# Patient Record
Sex: Female | Born: 1975 | Race: White | Hispanic: No | Smoking: Current every day smoker
Health system: Southern US, Community
[De-identification: ages and names within clinical notes are randomized; demographics above are authoritative.]

## PROBLEM LIST (undated history)

## (undated) DIAGNOSIS — G8929 Other chronic pain: Secondary | ICD-10-CM

## (undated) DIAGNOSIS — E079 Disorder of thyroid, unspecified: Secondary | ICD-10-CM

## (undated) DIAGNOSIS — I10 Essential (primary) hypertension: Secondary | ICD-10-CM

## (undated) DIAGNOSIS — N3090 Cystitis, unspecified without hematuria: Secondary | ICD-10-CM

## (undated) DIAGNOSIS — D649 Anemia, unspecified: Secondary | ICD-10-CM

## (undated) DIAGNOSIS — D689 Coagulation defect, unspecified: Secondary | ICD-10-CM

## (undated) DIAGNOSIS — R1084 Generalized abdominal pain: Secondary | ICD-10-CM

## (undated) DIAGNOSIS — L409 Psoriasis, unspecified: Secondary | ICD-10-CM

## (undated) DIAGNOSIS — M87059 Idiopathic aseptic necrosis of unspecified femur: Secondary | ICD-10-CM

## (undated) DIAGNOSIS — M810 Age-related osteoporosis without current pathological fracture: Secondary | ICD-10-CM

## (undated) DIAGNOSIS — F419 Anxiety disorder, unspecified: Secondary | ICD-10-CM

## (undated) DIAGNOSIS — B977 Papillomavirus as the cause of diseases classified elsewhere: Secondary | ICD-10-CM

## (undated) DIAGNOSIS — G709 Myoneural disorder, unspecified: Secondary | ICD-10-CM

## (undated) DIAGNOSIS — M1990 Unspecified osteoarthritis, unspecified site: Secondary | ICD-10-CM

## (undated) DIAGNOSIS — K219 Gastro-esophageal reflux disease without esophagitis: Secondary | ICD-10-CM

## (undated) HISTORY — DX: Generalized abdominal pain: R10.84

## (undated) HISTORY — PX: CHOLECYSTECTOMY: SHX55

## (undated) HISTORY — DX: Other chronic pain: G89.29

## (undated) HISTORY — DX: Idiopathic aseptic necrosis of unspecified femur: M87.059

## (undated) HISTORY — DX: Psoriasis, unspecified: L40.9

## (undated) HISTORY — DX: Coagulation defect, unspecified: D68.9

## (undated) HISTORY — DX: Papillomavirus as the cause of diseases classified elsewhere: B97.7

## (undated) HISTORY — DX: Anxiety disorder, unspecified: F41.9

## (undated) HISTORY — DX: Anemia, unspecified: D64.9

## (undated) HISTORY — DX: Age-related osteoporosis without current pathological fracture: M81.0

## (undated) HISTORY — DX: Myoneural disorder, unspecified: G70.9

---

## 2001-05-11 HISTORY — PX: GASTRIC BYPASS: SHX52

## 2002-03-13 HISTORY — PX: SMALL INTESTINE SURGERY: SHX150

## 2012-08-11 DIAGNOSIS — B977 Papillomavirus as the cause of diseases classified elsewhere: Secondary | ICD-10-CM

## 2012-08-11 HISTORY — DX: Papillomavirus as the cause of diseases classified elsewhere: B97.7

## 2013-11-14 ENCOUNTER — Encounter (HOSPITAL_COMMUNITY): Payer: Self-pay | Admitting: Emergency Medicine

## 2013-11-14 ENCOUNTER — Emergency Department (HOSPITAL_COMMUNITY)
Admission: EM | Admit: 2013-11-14 | Discharge: 2013-11-14 | Disposition: A | Discharge status: Home / Self Care | Payer: MEDICARE | Attending: Emergency Medicine | Admitting: Emergency Medicine

## 2013-11-14 DIAGNOSIS — Z3202 Encounter for pregnancy test, result negative: Secondary | ICD-10-CM | POA: Diagnosis not present

## 2013-11-14 DIAGNOSIS — I10 Essential (primary) hypertension: Secondary | ICD-10-CM | POA: Insufficient documentation

## 2013-11-14 DIAGNOSIS — R350 Frequency of micturition: Secondary | ICD-10-CM | POA: Diagnosis not present

## 2013-11-14 DIAGNOSIS — M129 Arthropathy, unspecified: Secondary | ICD-10-CM | POA: Insufficient documentation

## 2013-11-14 DIAGNOSIS — F17200 Nicotine dependence, unspecified, uncomplicated: Secondary | ICD-10-CM | POA: Insufficient documentation

## 2013-11-14 DIAGNOSIS — R300 Dysuria: Secondary | ICD-10-CM | POA: Diagnosis present

## 2013-11-14 DIAGNOSIS — E079 Disorder of thyroid, unspecified: Secondary | ICD-10-CM | POA: Diagnosis not present

## 2013-11-14 DIAGNOSIS — Z79899 Other long term (current) drug therapy: Secondary | ICD-10-CM | POA: Insufficient documentation

## 2013-11-14 DIAGNOSIS — N3000 Acute cystitis without hematuria: Secondary | ICD-10-CM | POA: Diagnosis not present

## 2013-11-14 HISTORY — DX: Disorder of thyroid, unspecified: E07.9

## 2013-11-14 HISTORY — DX: Unspecified osteoarthritis, unspecified site: M19.90

## 2013-11-14 HISTORY — DX: Essential (primary) hypertension: I10

## 2013-11-14 LAB — CBC WITH DIFFERENTIAL/PLATELET
BASOS ABS: 0 10*3/uL (ref 0.0–0.1)
Basophils Relative: 0 % (ref 0–1)
EOS ABS: 0.1 10*3/uL (ref 0.0–0.7)
Eosinophils Relative: 2 % (ref 0–5)
HEMATOCRIT: 36.7 % (ref 36.0–46.0)
Hemoglobin: 12.7 g/dL (ref 12.0–15.0)
LYMPHS ABS: 2.3 10*3/uL (ref 0.7–4.0)
LYMPHS PCT: 32 % (ref 12–46)
MCH: 36.7 pg — AB (ref 26.0–34.0)
MCHC: 34.6 g/dL (ref 30.0–36.0)
MCV: 106.1 fL — ABNORMAL HIGH (ref 78.0–100.0)
MONO ABS: 0.6 10*3/uL (ref 0.1–1.0)
Monocytes Relative: 8 % (ref 3–12)
Neutro Abs: 4.1 10*3/uL (ref 1.7–7.7)
Neutrophils Relative %: 58 % (ref 43–77)
PLATELETS: 157 10*3/uL (ref 150–400)
RBC: 3.46 MIL/uL — ABNORMAL LOW (ref 3.87–5.11)
RDW: 15 % (ref 11.5–15.5)
WBC: 7.1 10*3/uL (ref 4.0–10.5)

## 2013-11-14 LAB — URINALYSIS, ROUTINE W REFLEX MICROSCOPIC
BILIRUBIN URINE: NEGATIVE
Glucose, UA: NEGATIVE mg/dL
Hgb urine dipstick: NEGATIVE
KETONES UR: NEGATIVE mg/dL
LEUKOCYTES UA: NEGATIVE
NITRITE: POSITIVE — AB
PROTEIN: NEGATIVE mg/dL
Specific Gravity, Urine: 1.023 (ref 1.005–1.030)
UROBILINOGEN UA: 1 mg/dL (ref 0.0–1.0)
pH: 6 (ref 5.0–8.0)

## 2013-11-14 LAB — PREGNANCY, URINE: PREG TEST UR: NEGATIVE

## 2013-11-14 LAB — LIPASE, BLOOD: LIPASE: 21 U/L (ref 11–59)

## 2013-11-14 LAB — COMPREHENSIVE METABOLIC PANEL
ALT: 8 U/L (ref 0–35)
AST: 17 U/L (ref 0–37)
Albumin: 3.4 g/dL — ABNORMAL LOW (ref 3.5–5.2)
Alkaline Phosphatase: 65 U/L (ref 39–117)
Anion gap: 13 (ref 5–15)
BUN: 16 mg/dL (ref 6–23)
CO2: 22 meq/L (ref 19–32)
CREATININE: 0.73 mg/dL (ref 0.50–1.10)
Calcium: 9.1 mg/dL (ref 8.4–10.5)
Chloride: 104 mEq/L (ref 96–112)
GLUCOSE: 93 mg/dL (ref 70–99)
Potassium: 4.6 mEq/L (ref 3.7–5.3)
SODIUM: 139 meq/L (ref 137–147)
TOTAL PROTEIN: 6.8 g/dL (ref 6.0–8.3)
Total Bilirubin: 0.3 mg/dL (ref 0.3–1.2)

## 2013-11-14 LAB — URINE MICROSCOPIC-ADD ON

## 2013-11-14 MED ORDER — CEPHALEXIN 500 MG PO CAPS: 500.0000 mg | ORAL_CAPSULE | Freq: Four times a day (QID) | ORAL | Status: DC

## 2013-11-14 MED ORDER — CEPHALEXIN 250 MG PO CAPS
500.0000 mg | ORAL_CAPSULE | Freq: Once | ORAL | Status: AC
  Administered 2013-11-14: 500 mg via ORAL
  Filled 2013-11-14: qty 2

## 2013-11-14 NOTE — ED Notes (Signed)
Poss uti for 2 weeks [place] and she has multiple problems that she has been getting tests for.  She has had some urinary incontinence.  She has some adrenal gland problems.  She has many many complaints.  lmp 2 weeks ago

## 2013-11-14 NOTE — ED Provider Notes (Signed)
CSN: [id number]     Arrival date & time 11/14/13  1748 History   First MD Initiated Contact with Patient 11/14/13 2048     Chief Complaint  Patient presents with  . multiple complaints      (Consider location/radiation/quality/duration/timing/severity/associated sxs/prior Treatment) HPI Pt presents with c/o dysuria and urinary frequency.  No fever/chills.  She denies abdominal or back pain.  No vomiting or chills.  She states symptoms have been going on for the past 2 weeks.  She states she has multiple medical problems and has been seeing a rheumatologist and has been checked for lupus, RA, adrenal problems- but nothing has been found as of yet.  She recently moved from [place] and has not been able to see a doctor in this area yet.  She denies recent treatment for UTI.  She has been eating and drinking normally.  There are no other associated systemic symptoms, there are no other alleviating or modifying factors.   Past Medical History  Diagnosis Date  . Thyroid disease   . Arthritis   . Hypertension    History reviewed. No pertinent past surgical history. No family history on file. History  Substance Use Topics  . Smoking status: Current Every Day Smoker  . Smokeless tobacco: Not on file  . Alcohol Use: Yes   OB History   Grav Para Term Preterm Abortions TAB SAB Ect Mult Living                 Review of Systems ROS reviewed and all otherwise negative except for mentioned in HPI    Allergies  Ibuprofen; Toradol; Aspirin; and Cleocin  Home Medications   Prior to Admission medications   Medication Sig Start Date End Date Taking? Authorizing Provider  aspirin-acetaminophen-caffeine (EXCEDRIN MIGRAINE) [id number] MG per tablet Take 1 tablet by mouth every 6 (six) hours as needed for headache.   Yes Historical Provider, MD  furosemide (LASIX) 40 MG tablet Take 40 mg by mouth daily as needed for fluid.   Yes Historical Provider, MD  levonorgestrel-ethinyl estradiol  (ENPRESSE,TRIVORA) tablet Take 1 tablet by mouth daily.   Yes Historical Provider, MD  levothyroxine (SYNTHROID, LEVOTHROID) 125 MCG tablet Take 125 mcg by mouth daily before breakfast.   Yes Historical Provider, MD  nortriptyline (PAMELOR) 75 MG capsule Take 75 mg by mouth at bedtime.   Yes Historical Provider, MD  pregabalin (LYRICA) 225 MG capsule Take 225 mg by mouth at bedtime.   Yes Historical Provider, MD  propranolol (INDERAL) 40 MG tablet Take 40 mg by mouth 2 (two) times daily.   Yes Historical Provider, MD  RABEprazole (ACIPHEX) 20 MG tablet Take 20 mg by mouth 2 (two) times daily.   Yes Historical Provider, MD  simethicone (MYLICON) 125 MG chewable tablet Chew 125 mg by mouth every 6 (six) hours as needed for flatulence.   Yes Historical Provider, MD  topiramate (TOPAMAX) 50 MG tablet Take 50 mg by mouth at bedtime.   Yes Historical Provider, MD  cephALEXin (KEFLEX) 500 MG capsule Take 1 capsule (500 mg total) by mouth 4 (four) times daily. 11/14/13   Ethelda Chick, MD   BP 124/89  Pulse 93  Temp(Src) 98.6 F (37 C) (Oral)  Resp 15  SpO2 100%  LMP 10/31/2013 Vitals reviewed Physical Exam Physical Examination: General appearance - alert, well appearing, and in no distress Mental status - alert, oriented to person, place, and time Eyes - no conjunctival injection, no scleral icterus Mouth - mucous membranes  moist, pharynx normal without lesions Chest - clear to auscultation, no wheezes, rales or rhonchi, symmetric air entry Heart - normal rate, regular rhythm, normal S1, S2, no murmurs, rubs, clicks or gallops Abdomen - soft, nontender, nondistended, no masses or organomegaly Extremities - peripheral pulses normal, no pedal edema, no clubbing or cyanosis Skin - normal coloration and turgor, no rashes  ED Course  Procedures (including critical care time) Labs Review Labs Reviewed  CBC WITH DIFFERENTIAL - Abnormal; Notable for the following:    RBC 3.46 (*)    MCV 106.1 (*)     MCH 36.7 (*)    All other components within normal limits  COMPREHENSIVE METABOLIC PANEL - Abnormal; Notable for the following:    Albumin 3.4 (*)    All other components within normal limits  URINALYSIS, ROUTINE W REFLEX MICROSCOPIC - Abnormal; Notable for the following:    Color, Urine AMBER (*)    APPearance CLOUDY (*)    Nitrite POSITIVE (*)    All other components within normal limits  URINE MICROSCOPIC-ADD ON - Abnormal; Notable for the following:    Squamous Epithelial / LPF MANY (*)    Bacteria, UA MANY (*)    Casts HYALINE CASTS (*)    All other components within normal limits  LIPASE, BLOOD  PREGNANCY, URINE    Imaging Review No results found.   EKG Interpretation None      MDM   Final diagnoses:  Acute cystitis without hematuria    Pt presenting with c/o dysuria and urinary frequency over the past 2 weeks.  ua is c/w UTI.  Will start on keflex.  Pt's other labs are reassuring and she is overall nontoxic.  Discharged with strict return precautions.  Pt agreeable with plan.  I have given her information to being the process to obtain a PMD here in [place] as she is officially moving here.      Ethelda Chick, MD 11/14/13 [phone]

## 2013-11-14 NOTE — Discharge Instructions (Signed)
Return to the ED with any concerns including vomiting and not able to keep down liquids, fever/chills, fainting, decreased level of alertness/lethargy  You should contact the community health and wellness center at the number provided or the primary care doctor of your choice to work on getting referrals to the specialists that you need   Emergency Department Resource Guide 1) Find a Doctor and Pay Out of Pocket Although you won't have to find out who is covered by your insurance plan, it is a good idea to ask around and get recommendations. You will then need to call the office and see if the doctor you have chosen will accept you as a new patient and what types of options they offer for patients who are self-pay. Some doctors offer discounts or will set up payment plans for their patients who do not have insurance, but you will need to ask so you aren't surprised when you get to your appointment.  2) Contact Your Local Health Department Not all health departments have doctors that can see patients for sick visits, but many do, so it is worth a call to see if yours does. If you don't know where your local health department is, you can check in your phone book. The CDC also has a tool to help you locate your state's health department, and many state websites also have listings of all of their local health departments.  3) Find a Walk-in Clinic If your illness is not likely to be very severe or complicated, you may want to try a walk in clinic. These are popping up all over the country in pharmacies, drugstores, and shopping centers. They're usually staffed by nurse practitioners or physician assistants that have been trained to treat common illnesses and complaints. They're usually fairly quick and inexpensive. However, if you have serious medical issues or chronic medical problems, these are probably not your best option.  No Primary Care Doctor: - Call Health Connect at  [phone] - they can help  you locate a primary care doctor that  accepts your insurance, provides certain services, etc. - Physician Referral Service- [phone]  Chronic Pain Problems: Organization         Address  Phone   Notes  [facility]  [phone] Patients need to be referred by their primary care doctor.   Medication Assistance: Organization         Address  Phone   Notes  [facility] Med[facility] [address] [place] [address] [place], [place] [address] [phone] --Must be a resident of [facility] -- Must have NO insurance coverage whatsoever (no Medicaid/ Medicare, etc.) -- The pt. MUST have a primary care doctor that directs their care regularly and follows them in the community   [facility]ist  [phone]   [employer]  [phone]    Agencies that provide inexpensive medical care: Organization         Address  Phone   Notes  [facility]  [phone]   [facility]    [phone]   [facility] [address] [place], [place] [address] [phone]   [facility] [address] [place] [phone]   Planned Parenthood    [phone]   [facility]    [phone]   [facility] and [facility]  [address], [place] Phone:  [phone], Fax:  [phone]  Hours of Operation:  9 am - 6 pm, M-F.  Also accepts Medicaid/Medicare and self-pay.  [facility]  [address], [place] Phone: [phone](336) [phone], Fax: [phone]. Hours of Operation:  8:30 am - 5:30 pm, M-F.  Also accepts Medicaid and self-pay.  [facility] [place] [address], [place] Phone: [phone]) [phone]   [facility] [address], [place], [place] [phone], Ext. [phone] Mondays & Thursdays: 7-9 AM.  First 15 patients are seen on a first come, first serve basis.    Medicaid-accepting [facility] Providers:  Organization         Address  Phone   Notes  [facility] [address], [place] [phone]) [phone] Also accepts self-pay patients.  [facility] [address], [place]  [phone]) [phone]   [facility] [address], [place] [phone]) [phone]   [facility] [address] [place] [address], [place] [phone]) [phone]   Renaye RakersVeita Bland [address], [place]   [phone] Only accepts [place] Access [place] patients after they have their name applied to their card.   Self-Pay (no insurance) in [facility] [place]:  Organization         Address  Phone   Notes  Sickle Cell Patients, [facility] [address], [place] [phone]) [phone]   [facility] [address], [place] [phone]) [phone]   [facility]  [address], [place] [phone]   [facility]/Dr. Osei-Bonsu  [address] [place] [address], [place] or [phone], [place] [phone]) [phone] Phone number for both [place] and [place] locations is the same.  [facility] [address], [place] [phone]) [phone]   [facility] [place] [address] [place] [address], [place] or [address] [phone]   [facility] [address], [place] [phone]) [phone], phone; [phone](336) [phone], fax Sees patients 1st and 3rd Saturday of every month.  Must not qualify for public or private insurance (i.e. Medicaid, Medicare, [place] Health Choice, Veterans' Benefits)  Household income should be no more than 200% of the poverty level The clinic cannot treat you if you are pregnant or think you are pregnant  Sexually transmitted diseases are not treated at the clinic.    Dental Care: Organization         Address  Phone  Notes  [facility] Department of [facility]  [facility] [address], [place] [phone]) [phone] Accepts children up to age 40 who are enrolled in [place] or [place] Health Choice; pregnant women with a Medicaid card; and children who have applied for Medicaid or [place] Health Choice, but were declined, whose parents can pay a reduced fee at time of service.  [facility] of [facility] [place]  [address], [place] [phone]) [phone] Accepts children up to age 40 who are enrolled in [place] or [place] Health Choice; pregnant women with a Medicaid card; and children who have applied for Medicaid or [place] Health Choice, but were declined, whose parents can pay a reduced fee at time of service.  [facility]  [address], [place] [phone]) [phone] Patients are seen by appointment only. Walk-ins are not accepted. [facility] will  see patients 80 years of age and older. Monday - Tuesday (8am-5pm) Most Wednesdays (8:30-5pm) $30 per visit, cash only  [facility] Adult Dental Access PROGRAM  [address], [facility] [phone] Patients are seen by appointment only. Walk-ins are not accepted. [facility] will see patients 60 years of age and older. One Wednesday Evening (Monthly: Volunteer Based).  $30 per visit, cash only  [employer]  [phone] for adults; Children under age 10, call Graduate Pediatric Dentistry at [phone]. Children aged 80-14, please call [phone] to request a pediatric application.  Dental services are provided in all areas of dental care including fillings, crowns and bridges, complete and partial dentures, implants, gum treatment, root canals, and extractions. Preventive care is also provided. Treatment is provided to both adults and children. Patients are selected via a lottery and there is often a waiting list.   [facility] [address] [place]  [phone] [email or web address]   [facility] [address] [place], [place] [phone], Ext. [phone] Second and Fourth Thursday of each month, opens at 6:30 AM; Clinic ends at 9 AM.  Patients are seen on a first-come first-served basis, and a limited number are seen during each clinic.   [facility]  [address] [place] [place], [place] [phone]   Eligibility Requirements You must have lived in [place], [place], or [place] counties for at least the last three months.   You cannot be eligible for state or federal sponsored [place], including [employer], [place], or [employer].   You generally cannot be eligible for healthcare insurance through your employer.    How to apply: Eligibility screenings are held every Tuesday and Wednesday afternoon from 1:00 pm until 4:00 pm. You do not need an appointment for the interview!  [facility] [address], [place], [place] [phone]   [facility] Health Department  [phone]   [facility] Health Department  [phone]   [facility] Health Department  [phone]    Behavioral Health Resources in the Community: Intensive Outpatient Programs Organization         Address  Phone  Notes  [facility] [address], [place], [place] [phone]   [facility] Outpatient [address], [place], [place] [phone]   [facility]: [facility] [address] [place], [place]  [phone]   [facility] [address],  [place], [place] [phone] or [phone]   Substance Abuse Resources Organization         Address  Phone  Notes  Alcohol and Drug Services  [phone]   [facility]  [phone]   [facility]  [phone]   Floydene Flock  [phone]   Residential & Outpatient Substance Abuse Program  [phone]   Psychological Services Organization         Address  Phone  Notes  [facility]  [phone]   [facility]  [phone]   [facility] Mental Health [address] [place] [phone] or [phone]    Mobile Crisis Teams Organization         Address  Phone  Notes  [facility], Mobile Crisis Care Unit  [phone]   [facility]  [address] [place], [place] [phone]   [facility] [address] [place] [phone]    Self-Help/Support Groups Organization  Address  Phone             Notes  [place] of [place] - variety of support groups  [place] Call for more information  Narcotics Anonymous (NA), [facility] [address], [employer] [place]  2 meetings at this location   Special educational needs teacher         Address  Phone  Notes  [facility] [place],    [place]  [phone]   [facility]  [address], [place] [phone], [place], [place]   [place], [place] [phone] Admissions: 8am-3pm M-F  Incentives Substance [place] [address],    [place], [place] [phone]   [facility] [address] [place], [place],  [place]   [facility] [address],  [place], [place]   [facility] Programs - Intensive Outpatient [address], [place], [place]   [facility] ([place] [place].,  [place], [place] [phone] or [phone]   [facility] (RTS) [address] [place], [place] Accepts Medicaid  [facility] [place] [address]  [place] [phone] Substance Abuse/Addiction Treatment   [facility] Organization         Address  Phone  Notes  [facility]  [phone]   Domenic Schwab, PhD [address] [place], [place]   [phone] or  [phone]   [place], [place] [phone]   [facility] [address], [place], [place] [phone] Insurance/Medicaid/sponsorship through [facility] and Families [address]                                    [place], [place] [phone] [place] [address], [place] [phone]    Dr. Adele Schilder  [phone]   [facility] Dept. 1) [address], [place] 2) [place] 3)  [place] [address], [place] [phone]  [phone]   [place] [phone] or [phone] (After Hours)

## 2013-11-14 NOTE — ED Notes (Addendum)
Pt states that she was given a pill to stop her adrenal glands and was supposed to follow up with her DR in [place] for further testing. States that she didn't go to the appt. States that she has not established any care here yet. States that she was also supposed to see a kidney DR and immunologist and never set either up. States that she came here to be evaluated because she didn't know what to do. States she also has hx chronic abd pain and nausea and is on disability due to it. Also c/o HA and pressure behind eyes.

## 2014-01-17 ENCOUNTER — Emergency Department (HOSPITAL_COMMUNITY)
Admission: EM | Admit: 2014-01-17 | Discharge: 2014-01-17 | Disposition: A | Discharge status: Home / Self Care | Payer: MEDICARE | Attending: Emergency Medicine | Admitting: Emergency Medicine

## 2014-01-17 ENCOUNTER — Encounter (HOSPITAL_COMMUNITY): Payer: Self-pay | Admitting: *Deleted

## 2014-01-17 DIAGNOSIS — K0889 Other specified disorders of teeth and supporting structures: Secondary | ICD-10-CM

## 2014-01-17 DIAGNOSIS — I10 Essential (primary) hypertension: Secondary | ICD-10-CM | POA: Diagnosis not present

## 2014-01-17 DIAGNOSIS — M1990 Unspecified osteoarthritis, unspecified site: Secondary | ICD-10-CM | POA: Diagnosis not present

## 2014-01-17 DIAGNOSIS — E079 Disorder of thyroid, unspecified: Secondary | ICD-10-CM | POA: Insufficient documentation

## 2014-01-17 DIAGNOSIS — R51 Headache: Secondary | ICD-10-CM | POA: Diagnosis not present

## 2014-01-17 DIAGNOSIS — K029 Dental caries, unspecified: Secondary | ICD-10-CM | POA: Diagnosis not present

## 2014-01-17 DIAGNOSIS — Z79899 Other long term (current) drug therapy: Secondary | ICD-10-CM | POA: Insufficient documentation

## 2014-01-17 DIAGNOSIS — Z792 Long term (current) use of antibiotics: Secondary | ICD-10-CM | POA: Diagnosis not present

## 2014-01-17 DIAGNOSIS — Z720 Tobacco use: Secondary | ICD-10-CM | POA: Diagnosis not present

## 2014-01-17 DIAGNOSIS — K088 Other specified disorders of teeth and supporting structures: Secondary | ICD-10-CM | POA: Insufficient documentation

## 2014-01-17 HISTORY — DX: Gastro-esophageal reflux disease without esophagitis: K21.9

## 2014-01-17 MED ORDER — PROPRANOLOL HCL 40 MG PO TABS: 40.0000 mg | ORAL_TABLET | Freq: Two times a day (BID) | ORAL | Status: DC

## 2014-01-17 MED ORDER — HYDROCODONE-ACETAMINOPHEN 5-325 MG PO TABS: 1.0000 | ORAL_TABLET | Freq: Four times a day (QID) | ORAL | Status: DC | PRN

## 2014-01-17 MED ORDER — HYDROCODONE-ACETAMINOPHEN 5-325 MG PO TABS
1.0000 | ORAL_TABLET | Freq: Once | ORAL | Status: AC
  Administered 2014-01-17: 1 via ORAL
  Filled 2014-01-17: qty 1

## 2014-01-17 MED ORDER — PENICILLIN V POTASSIUM 500 MG PO TABS: 500.0000 mg | ORAL_TABLET | Freq: Four times a day (QID) | ORAL | Status: DC

## 2014-01-17 NOTE — ED Notes (Signed)
Pt complains of tooth pain x 10 days and today states her tooth split in half and states she ate part of her tooth. Pt also complaining of headache. Pt has hx of headaches, takes toprimax for them.  Pt also states she is very tired. Also states her heart is racing and every now and then she becomes SOB and has to sit down and take a break, however she states she is running low on propanolol so she is taking them every few days. Pt moved from [place] to [place] and doesn't have insurance here yet, only emergency medical insurance covered by her insurance now. Pt expressing the stress of raising an autistic child.

## 2014-01-17 NOTE — Discharge Instructions (Signed)
Return here as needed. Follow up with the dentist provided. °

## 2014-01-17 NOTE — ED Provider Notes (Signed)
CSN: [id number]     Arrival date & time 01/17/14  1433 History   First MD Initiated Contact with Patient 01/17/14 1603     Chief Complaint  Patient presents with  . Dental Pain  . Headache     (Consider location/radiation/quality/duration/timing/severity/associated sxs/prior Treatment) HPI Patient presents to the emergency department with dental pain on a broken left lower second molar.  Patient states that the tooth broke yesterday.  Patient states she did not take any medications prior to arrival.  She states that nothing seems to make her condition better or worse.  Patient denies nausea, vomiting, blurred vision, weakness, dizziness, chest pain, shortness of breath, neck pain, throat swelling or syncope.  Patient states that she has not seen a dentist in quite a while Past Medical History  Diagnosis Date  . Thyroid disease   . Arthritis   . Hypertension   . Acid reflux    History reviewed. No pertinent past surgical history. History reviewed. No pertinent family history. History  Substance Use Topics  . Smoking status: Current Every Day Smoker  . Smokeless tobacco: Not on file  . Alcohol Use: Yes   OB History    No data available     Review of Systems  All other systems negative except as documented in the HPI. All pertinent positives and negatives as reviewed in the HPI.  Allergies  Ibuprofen; Toradol; Aspirin; and Cleocin  Home Medications   Prior to Admission medications   Medication Sig Start Date End Date Taking? Authorizing Provider  aspirin-acetaminophen-caffeine (EXCEDRIN MIGRAINE) [phone] [id number] MG per tablet Take 1 tablet by mouth every 6 (six) hours as needed for headache.    Historical Provider, MD  cephALEXin (KEFLEX) 500 MG capsule Take 1 capsule (500 mg total) by mouth 4 (four) times daily. 11/14/13   Ethelda ChickMartha K Linker, MD  furosemide (LASIX) 40 MG tablet Take 40 mg by mouth daily as needed for fluid.    Historical Provider, MD  levonorgestrel-ethinyl  estradiol (ENPRESSE,TRIVORA) tablet Take 1 tablet by mouth daily.    Historical Provider, MD  levothyroxine (SYNTHROID, LEVOTHROID) 125 MCG tablet Take 125 mcg by mouth daily before breakfast.    Historical Provider, MD  nortriptyline (PAMELOR) 75 MG capsule Take 75 mg by mouth at bedtime.    Historical Provider, MD  pregabalin (LYRICA) 225 MG capsule Take 225 mg by mouth at bedtime.    Historical Provider, MD  propranolol (INDERAL) 40 MG tablet Take 40 mg by mouth 2 (two) times daily.    Historical Provider, MD  RABEprazole (ACIPHEX) 20 MG tablet Take 20 mg by mouth 2 (two) times daily.    Historical Provider, MD  simethicone (MYLICON) 125 MG chewable tablet Chew 125 mg by mouth every 6 (six) hours as needed for flatulence.    Historical Provider, MD  topiramate (TOPAMAX) 50 MG tablet Take 50 mg by mouth at bedtime.    Historical Provider, MD   BP 133/85 mmHg  Pulse 102  Temp(Src) 98.6 F (37 C)  Resp 18  Ht 5\' 1"  (1.549 m)  Wt 190 lb (86.183 kg)  BMI 35.92 kg/m2  SpO2 100%  LMP 12/27/2013 Physical Exam  Constitutional: She is oriented to person, place, and time. She appears well-developed and well-nourished.  HENT:  Head: Normocephalic and atraumatic.  Mouth/Throat: Oropharynx is clear and moist and mucous membranes are normal. She does not have dentures. No oral lesions. No trismus in the jaw. Abnormal dentition. Dental caries present. No uvula swelling.  Eyes: Pupils  are equal, round, and reactive to light.  Neck: Normal range of motion. Neck supple.  Cardiovascular: Normal rate, regular rhythm and normal heart sounds.  Exam reveals no gallop and no friction rub.   No murmur heard. Pulmonary/Chest: Effort normal and breath sounds normal. No respiratory distress.  Neurological: She is alert and oriented to person, place, and time. She exhibits normal muscle tone. Coordination normal.  Skin: Skin is warm and dry. No rash noted. No erythema.  Nursing note and vitals reviewed.   ED  Course  Procedures (including critical care time) Labs Review Labs Reviewed - No data to display  Patient will be referred to dentistry for further evaluation and care.  She is advised that we will treat her for her symptoms.there is no sign of deep space infection of the neck  MDM   Final diagnoses:  None       Carlyle DollyChristopher W Jameir Ake, PA-C 01/17/14 1634  Gerhard Munchobert Lockwood, MD 01/17/14 2010

## 2014-01-17 NOTE — ED Notes (Signed)
Pt has multiple complaints. Reports left side lower dental pain, had a tooth break today. Also having chronic fatigue, severe headache, anxiety and unable to sleep. Airway intact, denies SI or HI.

## 2014-02-27 ENCOUNTER — Emergency Department (HOSPITAL_COMMUNITY): Payer: MEDICARE

## 2014-02-27 ENCOUNTER — Encounter (HOSPITAL_COMMUNITY): Payer: Self-pay | Admitting: Emergency Medicine

## 2014-02-27 ENCOUNTER — Emergency Department (HOSPITAL_COMMUNITY)
Admission: EM | Admit: 2014-02-27 | Discharge: 2014-02-28 | Disposition: A | Discharge status: Home / Self Care | Payer: MEDICARE | Attending: Emergency Medicine | Admitting: Emergency Medicine

## 2014-02-27 DIAGNOSIS — W1830XA Fall on same level, unspecified, initial encounter: Secondary | ICD-10-CM | POA: Diagnosis not present

## 2014-02-27 DIAGNOSIS — S8992XA Unspecified injury of left lower leg, initial encounter: Secondary | ICD-10-CM | POA: Diagnosis present

## 2014-02-27 DIAGNOSIS — I10 Essential (primary) hypertension: Secondary | ICD-10-CM | POA: Insufficient documentation

## 2014-02-27 DIAGNOSIS — W19XXXA Unspecified fall, initial encounter: Secondary | ICD-10-CM

## 2014-02-27 DIAGNOSIS — Z79899 Other long term (current) drug therapy: Secondary | ICD-10-CM | POA: Diagnosis not present

## 2014-02-27 DIAGNOSIS — Z720 Tobacco use: Secondary | ICD-10-CM | POA: Insufficient documentation

## 2014-02-27 DIAGNOSIS — Y9389 Activity, other specified: Secondary | ICD-10-CM | POA: Diagnosis not present

## 2014-02-27 DIAGNOSIS — Z7952 Long term (current) use of systemic steroids: Secondary | ICD-10-CM | POA: Insufficient documentation

## 2014-02-27 DIAGNOSIS — Y9289 Other specified places as the place of occurrence of the external cause: Secondary | ICD-10-CM | POA: Diagnosis not present

## 2014-02-27 DIAGNOSIS — K219 Gastro-esophageal reflux disease without esophagitis: Secondary | ICD-10-CM | POA: Diagnosis not present

## 2014-02-27 DIAGNOSIS — M25062 Hemarthrosis, left knee: Secondary | ICD-10-CM | POA: Diagnosis not present

## 2014-02-27 DIAGNOSIS — Z87448 Personal history of other diseases of urinary system: Secondary | ICD-10-CM | POA: Insufficient documentation

## 2014-02-27 DIAGNOSIS — M25569 Pain in unspecified knee: Secondary | ICD-10-CM

## 2014-02-27 DIAGNOSIS — K2970 Gastritis, unspecified, without bleeding: Secondary | ICD-10-CM | POA: Diagnosis not present

## 2014-02-27 DIAGNOSIS — M1990 Unspecified osteoarthritis, unspecified site: Secondary | ICD-10-CM | POA: Insufficient documentation

## 2014-02-27 DIAGNOSIS — Y998 Other external cause status: Secondary | ICD-10-CM | POA: Insufficient documentation

## 2014-02-27 DIAGNOSIS — S8002XA Contusion of left knee, initial encounter: Secondary | ICD-10-CM | POA: Insufficient documentation

## 2014-02-27 DIAGNOSIS — Z793 Long term (current) use of hormonal contraceptives: Secondary | ICD-10-CM | POA: Diagnosis not present

## 2014-02-27 DIAGNOSIS — E079 Disorder of thyroid, unspecified: Secondary | ICD-10-CM | POA: Insufficient documentation

## 2014-02-27 DIAGNOSIS — K2960 Other gastritis without bleeding: Secondary | ICD-10-CM

## 2014-02-27 HISTORY — DX: Cystitis, unspecified without hematuria: N30.90

## 2014-02-27 LAB — CBC WITH DIFFERENTIAL/PLATELET
Basophils Absolute: 0.1 10*3/uL (ref 0.0–0.1)
Basophils Relative: 1 % (ref 0–1)
EOS PCT: 1 % (ref 0–5)
Eosinophils Absolute: 0.1 10*3/uL (ref 0.0–0.7)
HCT: 40.2 % (ref 36.0–46.0)
HEMOGLOBIN: 13.3 g/dL (ref 12.0–15.0)
LYMPHS ABS: 2.6 10*3/uL (ref 0.7–4.0)
Lymphocytes Relative: 32 % (ref 12–46)
MCH: 35.5 pg — AB (ref 26.0–34.0)
MCHC: 33.1 g/dL (ref 30.0–36.0)
MCV: 107.2 fL — AB (ref 78.0–100.0)
MONOS PCT: 9 % (ref 3–12)
Monocytes Absolute: 0.7 10*3/uL (ref 0.1–1.0)
Neutro Abs: 4.7 10*3/uL (ref 1.7–7.7)
Neutrophils Relative %: 57 % (ref 43–77)
Platelets: 181 10*3/uL (ref 150–400)
RBC: 3.75 MIL/uL — AB (ref 3.87–5.11)
RDW: 12.1 % (ref 11.5–15.5)
WBC: 8.2 10*3/uL (ref 4.0–10.5)

## 2014-02-27 LAB — I-STAT TROPONIN, ED: Troponin i, poc: 0 ng/mL (ref 0.00–0.08)

## 2014-02-27 LAB — TROPONIN I: Troponin I: <0.3 ng/mL (ref <0.30)

## 2014-02-27 LAB — LIPASE, BLOOD: Lipase: 21 U/L (ref 11–59)

## 2014-02-27 MED ORDER — HYDROCODONE-ACETAMINOPHEN 5-325 MG PO TABS
1.0000 | ORAL_TABLET | Freq: Once | ORAL | Status: AC
  Administered 2014-02-28: 1 via ORAL
  Filled 2014-02-27: qty 1

## 2014-02-27 MED ORDER — METOCLOPRAMIDE HCL 10 MG PO TABS
10.0000 mg | ORAL_TABLET | Freq: Once | ORAL | Status: AC
  Administered 2014-02-28: 10 mg via ORAL
  Filled 2014-02-27: qty 1

## 2014-02-27 MED ORDER — SUCRALFATE 1 GM/10ML PO SUSP
1.0000 g | Freq: Once | ORAL | Status: AC
  Administered 2014-02-28: 1 g via ORAL
  Filled 2014-02-27: qty 10

## 2014-02-27 MED ORDER — RANITIDINE HCL 150 MG/10ML PO SYRP
150.0000 mg | ORAL_SOLUTION | Freq: Once | ORAL | Status: AC
  Administered 2014-02-28: 150 mg via ORAL
  Filled 2014-02-27: qty 10

## 2014-02-27 MED ORDER — SODIUM CHLORIDE 0.9 % IV BOLUS (SEPSIS)
1000.0000 mL | Freq: Once | INTRAVENOUS | Status: AC
  Administered 2014-02-28: 1000 mL via INTRAVENOUS

## 2014-02-27 NOTE — ED Provider Notes (Signed)
CSN: [id number]     Arrival date & time 02/27/14  2220 History  This chart was scribed for Tomasita CrumbleAdeleke Athenia Rys, MD by Karle PlumberJennifer Tensley, ED Scribe. This patient was seen in room D36C/D36C and the patient's care was started at 11:07 PM.   Chief Complaint  Patient presents with  . Abdominal Pain   Patient is a 38 y.o. female presenting with abdominal pain. The history is provided by the patient. No language interpreter was used.  Abdominal Pain Associated symptoms: diarrhea   Associated symptoms: no chills, no dysuria, no fever, no hematuria and no nausea    HPI Comments:  Glenford PeersCrystal Dyer is a 38 y.o. female with PMH of GERD who presents to the Emergency Department complaining of severe left knee pain secondary to falling on it earlier today. Pt reports she was chasing her son and fell on it around 8 PM. She reports severe pain to the lateral aspect of the left knee with associated bruising. Bearing weight makes the pain worse. Resting helps to alleviate the pain. She also reports abdominal pain for the past three weeks. She reports she has been having acid come up into the back of her throat and reports associated burning. This is not a new problem for the pt. Eating makes the GERD worse. This is a chronic issue for her as she has had multiple surgeries and multiple medications for this. Denies any alleviating factors. Denies LOC, head trauma, nausea, fever or chills, numbness, tingling or weakness of the LLE, left hip or ankle injury, dysuria or hematuria. PMH of HTN, cystitis, arthritis and thyroid disease.   Pt does not have a PCP.  Past Medical History  Diagnosis Date  . Thyroid disease   . Arthritis   . Hypertension   . Acid reflux   . Cystitis    History reviewed. No pertinent past surgical history. No family history on file. History  Substance Use Topics  . Smoking status: Current Every Day Smoker  . Smokeless tobacco: Not on file  . Alcohol Use: Yes   OB History    No data available      Review of Systems  Constitutional: Negative for fever and chills.  Gastrointestinal: Positive for abdominal pain and diarrhea. Negative for nausea.  Genitourinary: Negative for dysuria and hematuria.  Musculoskeletal: Positive for arthralgias.  Skin: Positive for color change. Negative for wound.  Neurological: Negative for syncope, weakness and numbness.  All other systems reviewed and are negative.   Allergies  Ibuprofen; Toradol; Aspirin; and Cleocin  Home Medications   Prior to Admission medications   Medication Sig Start Date End Date Taking? Authorizing Provider  ALPRAZolam Prudy Feeler(XANAX) 0.5 MG tablet Take 0.5 mg by mouth 3 (three) times daily as needed for anxiety.   Yes Historical Provider, MD  aspirin-acetaminophen-caffeine (EXCEDRIN MIGRAINE) [phone] MG per tablet Take 1 tablet by mouth every 6 (six) hours as needed for headache.   Yes Historical Provider, MD  dexamethasone (DECADRON) 1 MG tablet Take 1 mg by mouth 2 (two) times daily with a meal.   Yes Historical Provider, MD  furosemide (LASIX) 40 MG tablet Take 40 mg by mouth daily as needed for fluid.   Yes Historical Provider, MD  levonorgestrel-ethinyl estradiol (ENPRESSE,TRIVORA) tablet Take 1 tablet by mouth daily.   Yes Historical Provider, MD  levothyroxine (SYNTHROID, LEVOTHROID) 125 MCG tablet Take 125 mcg by mouth daily before breakfast.   Yes Historical Provider, MD  nortriptyline (PAMELOR) 75 MG capsule Take 75 mg by mouth at  bedtime.   Yes Historical Provider, MD  propranolol (INDERAL) 40 MG tablet Take 1 tablet (40 mg total) by mouth 2 (two) times daily. 01/17/14  Yes Jamesetta Orleanshristopher W Lawyer, PA-C  RABEprazole (ACIPHEX) 20 MG tablet Take 20 mg by mouth 2 (two) times daily.   Yes Historical Provider, MD  simethicone (MYLICON) 125 MG chewable tablet Chew 125 mg by mouth every 6 (six) hours as needed for flatulence.   Yes Historical Provider, MD  topiramate (TOPAMAX) 50 MG tablet Take 50 mg by mouth at bedtime.    Yes Historical Provider, MD  cephALEXin (KEFLEX) 500 MG capsule Take 1 capsule (500 mg total) by mouth 4 (four) times daily. Patient not taking: Reported on 02/27/2014 11/14/13   Ethelda ChickMartha K Linker, MD  HYDROcodone-acetaminophen (NORCO/VICODIN) 5-325 MG per tablet Take 1 tablet by mouth every 6 (six) hours as needed for moderate pain. Patient not taking: Reported on 02/27/2014 01/17/14   Jamesetta Orleanshristopher W Lawyer, PA-C  penicillin v potassium (VEETID) 500 MG tablet Take 1 tablet (500 mg total) by mouth 4 (four) times daily. Patient not taking: Reported on 02/27/2014 01/17/14   Jamesetta Orleanshristopher W Lawyer, PA-C  pregabalin (LYRICA) 225 MG capsule Take 225 mg by mouth at bedtime.    Historical Provider, MD   Triage Vitals: BP 147/93 mmHg  Pulse 86  Temp(Src) 98.1 F (36.7 C) (Oral)  Resp 20  Ht 5\' 1"  (1.549 m)  Wt 190 lb (86.183 kg)  BMI 35.92 kg/m2  SpO2 99%  LMP 02/21/2014 Physical Exam  Constitutional: She is oriented to person, place, and time. She appears well-developed and well-nourished. No distress.  HENT:  Head: Normocephalic and atraumatic.  Eyes: Conjunctivae and EOM are normal. Pupils are equal, round, and reactive to light. No scleral icterus.  Neck: Normal range of motion. Neck supple. No JVD present. No tracheal deviation present. No thyromegaly present.  Cardiovascular: Normal rate, regular rhythm and normal heart sounds.  Exam reveals no gallop and no friction rub.   No murmur heard. Pulmonary/Chest: Effort normal and breath sounds normal. No respiratory distress. She has no wheezes. She exhibits no tenderness.  Abdominal: Soft. Bowel sounds are normal. She exhibits no distension and no mass. There is no tenderness. There is no rebound and no guarding.  Musculoskeletal: Normal range of motion. She exhibits tenderness. She exhibits no edema.  Tenderness to palpation of lateral aspect to left knee. Left knee swollen with ecchymosis.  Lymphadenopathy:    She has no cervical adenopathy.   Neurological: She is alert and oriented to person, place, and time.  Skin: Skin is warm and dry. No rash noted. No erythema. No pallor.  Nursing note and vitals reviewed.   ED Course  Procedures (including critical care time) DIAGNOSTIC STUDIES: Oxygen Saturation is 99% on RA, normal by my interpretation.   COORDINATION OF CARE: 11:12 PM- Will order X-Ray and lab work. Pt verbalizes understanding and agrees to plan.  Medications  ranitidine (ZANTAC) 150 MG/10ML syrup 150 mg (150 mg Oral Given 02/28/14 0006)  sucralfate (CARAFATE) 1 GM/10ML suspension 1 g (1 g Oral Given 02/28/14 0006)  metoCLOPramide (REGLAN) tablet 10 mg (10 mg Oral Given 02/28/14 0006)  HYDROcodone-acetaminophen (NORCO/VICODIN) 5-325 MG per tablet 1 tablet (1 tablet Oral Given 02/28/14 0006)  sodium chloride 0.9 % bolus 1,000 mL (1,000 mLs Intravenous New Bag/Given 02/28/14 0006)    Labs Review Labs Reviewed  CBC WITH DIFFERENTIAL - Abnormal; Notable for the following:    RBC 3.75 (*)    MCV 107.2 (*)  MCH 35.5 (*)    All other components within normal limits  COMPREHENSIVE METABOLIC PANEL - Abnormal; Notable for the following:    Sodium 134 (*)    Albumin 3.1 (*)    All other components within normal limits  URINE CULTURE  LIPASE, BLOOD  TROPONIN I  URINALYSIS, ROUTINE W REFLEX MICROSCOPIC  I-STAT TROPOININ, ED  POC URINE PREG, ED    Imaging Review Dg Ankle Complete Left  02/27/2014   CLINICAL DATA:  Larey Seat earlier today with knee pain.  Also ankle pain.  EXAM: LEFT ANKLE COMPLETE - 3+ VIEW  COMPARISON:  None.  FINDINGS: There is no evidence of fracture, dislocation, or joint effusion. There is no evidence of arthropathy or other focal bone abnormality. Mild medial and lateral soft tissue swelling.  IMPRESSION: Negative for fracture.   Electronically Signed   By: Davonna Belling M.D.   On: 02/27/2014 23:48   Dg Knee Complete 4 Views Left  02/27/2014   CLINICAL DATA:  Status post fall; landed on left  knee, with diffuse left knee pain. Initial encounter.  EXAM: LEFT KNEE - COMPLETE 4+ VIEW  COMPARISON:  None.  FINDINGS: There is no evidence of fracture or dislocation. The joint spaces are preserved. No significant degenerative change is seen; the patellofemoral joint is grossly unremarkable in appearance. A fabella is noted.  No significant joint effusion is seen. The visualized soft tissues are normal in appearance.  IMPRESSION: No evidence of fracture or dislocation.   Electronically Signed   By: Roanna Raider M.D.   On: 02/27/2014 23:43     EKG Interpretation None      MDM   Final diagnoses:  Fall  Knee pain    Patient presents emergency department out of concern for knee pain, abdominal pain and reflux. Her left knee and ankle x-rays are negative for any fractures. She likely has a hemarthrosis with swelling. Continue pain medication relief at home. Her abdominal complaints are chronic FOR her. She states she has run out of meds: She does not have a primary care physician. She was treated with ranitidine, sucralfate, and Reglan. Upon repeat assessment patient's symptoms have improved. We'll discharge home with Norco and sucralfate. Her vital signs remain within her normal limits and she is safe for discharge.  I personally performed the services described in this documentation, which was scribed in my presence. The recorded information has been reviewed and is accurate.     Tomasita Crumble, MD 02/28/14 [phone]

## 2014-02-27 NOTE — ED Notes (Signed)
Pt st's she fell this am chasing her child landing on left knee.  Pt c/o pain with wt bearing to left knee.  Pt also c/o upper abd pain x's 3 weeks.  St's she is having reflux that taste like bile."st's it's pouring into my mouth and also is going into my lungs because I am congested.  Pt also st's she has been having diarrhea x's 3 weeks, 3 stools per day.

## 2014-02-27 NOTE — ED Notes (Signed)
MD at bedside. 

## 2014-02-27 NOTE — ED Notes (Signed)
Pt. tripped and fell on her knees this morning , reports pain and mild swelling at left knee , ambulatory .

## 2014-02-28 LAB — COMPREHENSIVE METABOLIC PANEL
ALK PHOS: 75 U/L (ref 39–117)
ALT: 12 U/L (ref 0–35)
AST: 18 U/L (ref 0–37)
Albumin: 3.1 g/dL — ABNORMAL LOW (ref 3.5–5.2)
Anion gap: 11 (ref 5–15)
BUN: 13 mg/dL (ref 6–23)
CALCIUM: 8.9 mg/dL (ref 8.4–10.5)
CO2: 21 mEq/L (ref 19–32)
Chloride: 102 mEq/L (ref 96–112)
Creatinine, Ser: 0.65 mg/dL (ref 0.50–1.10)
GLUCOSE: 87 mg/dL (ref 70–99)
Potassium: 3.9 mEq/L (ref 3.7–5.3)
Sodium: 134 mEq/L — ABNORMAL LOW (ref 137–147)
Total Bilirubin: 0.3 mg/dL (ref 0.3–1.2)
Total Protein: 6.4 g/dL (ref 6.0–8.3)

## 2014-02-28 MED ORDER — SUCRALFATE 1 GM/10ML PO SUSP: 1.0000 g | Freq: Two times a day (BID) | ORAL | Status: DC | PRN

## 2014-02-28 MED ORDER — HYDROCODONE-ACETAMINOPHEN 5-325 MG PO TABS: 1.0000 | ORAL_TABLET | Freq: Two times a day (BID) | ORAL | Status: DC | PRN

## 2014-02-28 NOTE — Discharge Instructions (Signed)
Knee Pain Dorothy Dyer, you were seen today after a fall and for reflux. Her x-rays are negative. Your blood work is normal. Continue to take medication at home for pain relief. Follow-up with a primary care physician within 3 days for continued management. If your symptoms worsen come back to emergency department immediately for repeat evaluation. Thank you. Knee pain can be a result of an injury or other medical conditions. Treatment will depend on the cause of your pain. HOME CARE  Only take medicine as told by your doctor.  Keep a healthy weight. Being overweight can make the knee hurt more.  Stretch before exercising or playing sports.  If there is constant knee pain, change the way you exercise. Ask your doctor for advice.  Make sure shoes fit well. Choose the right shoe for the sport or activity.  Protect your knees. Wear kneepads if needed.  Rest when you are tired. GET HELP RIGHT AWAY IF:   Your knee pain does not stop.  Your knee pain does not get better.  Your knee joint feels hot to the touch.  You have a fever. MAKE SURE YOU:   Understand these instructions.  Will watch this condition.  Will get help right away if you are not doing well or get worse. Document Released: 05/26/2008 Document Revised: 05/22/2011 Document Reviewed: 05/26/2008 [facility] Patient Information 2015 [place], [place]. This information is not intended to replace advice given to you by your health care provider. Make sure you discuss any questions you have with your health care provider. Food Choices for Gastroesophageal Reflux Disease When you have gastroesophageal reflux disease (GERD), the foods you eat and your eating habits are very important. Choosing the right foods can help ease your discomfort.  WHAT GUIDELINES DO I NEED TO FOLLOW?   Choose fruits, vegetables, whole grains, and low-fat dairy products.   Choose low-fat meat, fish, and poultry.  Limit fats such as oils, salad  dressings, butter, nuts, and avocado.   Keep a food diary. This helps you identify foods that cause symptoms.   Avoid foods that cause symptoms. These may be different for everyone.   Eat small meals often instead of 3 large meals a day.   Eat your meals slowly, in a place where you are relaxed.   Limit fried foods.   Cook foods using methods other than frying.   Avoid drinking alcohol.   Avoid drinking large amounts of liquids with your meals.   Avoid bending over or lying down until 2-3 hours after eating.  WHAT FOODS ARE NOT RECOMMENDED?  These are some foods and drinks that may make your symptoms worse: Vegetables Tomatoes. Tomato juice. Tomato and spaghetti sauce. Chili peppers. Onion and garlic. Horseradish. Fruits Oranges, grapefruit, and lemon (fruit and juice). Meats High-fat meats, fish, and poultry. This includes hot dogs, ribs, ham, sausage, salami, and bacon. Dairy Whole milk and chocolate milk. Sour cream. Cream. Butter. Ice cream. Cream cheese.  Drinks Coffee and tea. Bubbly (carbonated) drinks or energy drinks. Condiments Hot sauce. Barbecue sauce.  Sweets/Desserts Chocolate and cocoa. Donuts. Peppermint and spearmint. Fats and Oils High-fat foods. This includes [place] fries and potato chips. Other Vinegar. Strong spices. This includes black pepper, white pepper, red pepper, cayenne, curry powder, cloves, ginger, and chili powder. The items listed above may not be a complete list of foods and drinks to avoid. Contact your dietitian for more information. Document Released: 08/29/2011 Document Revised: 03/04/2013 Document Reviewed: 01/01/2013 [facility]Exit[facility] Patient Information 2015 [place], [place]. This  information is not intended to replace advice given to you by your health care provider. Make sure you discuss any questions you have with your health care provider. ° °

## 2014-06-05 ENCOUNTER — Emergency Department (HOSPITAL_COMMUNITY): Payer: Medicare Other

## 2014-06-05 ENCOUNTER — Emergency Department (HOSPITAL_COMMUNITY)
Admission: EM | Admit: 2014-06-05 | Discharge: 2014-06-05 | Disposition: A | Discharge status: Home / Self Care | Payer: Medicare Other | Attending: Emergency Medicine | Admitting: Emergency Medicine

## 2014-06-05 ENCOUNTER — Encounter (HOSPITAL_COMMUNITY): Payer: Self-pay | Admitting: Physical Medicine and Rehabilitation

## 2014-06-05 DIAGNOSIS — Z8742 Personal history of other diseases of the female genital tract: Secondary | ICD-10-CM | POA: Diagnosis not present

## 2014-06-05 DIAGNOSIS — Z7952 Long term (current) use of systemic steroids: Secondary | ICD-10-CM | POA: Diagnosis not present

## 2014-06-05 DIAGNOSIS — R109 Unspecified abdominal pain: Secondary | ICD-10-CM | POA: Insufficient documentation

## 2014-06-05 DIAGNOSIS — Z793 Long term (current) use of hormonal contraceptives: Secondary | ICD-10-CM | POA: Diagnosis not present

## 2014-06-05 DIAGNOSIS — L0231 Cutaneous abscess of buttock: Secondary | ICD-10-CM | POA: Diagnosis not present

## 2014-06-05 DIAGNOSIS — R000 Tachycardia, unspecified: Secondary | ICD-10-CM | POA: Diagnosis not present

## 2014-06-05 DIAGNOSIS — G8929 Other chronic pain: Secondary | ICD-10-CM | POA: Insufficient documentation

## 2014-06-05 DIAGNOSIS — E079 Disorder of thyroid, unspecified: Secondary | ICD-10-CM | POA: Insufficient documentation

## 2014-06-05 DIAGNOSIS — M1990 Unspecified osteoarthritis, unspecified site: Secondary | ICD-10-CM | POA: Diagnosis not present

## 2014-06-05 DIAGNOSIS — K219 Gastro-esophageal reflux disease without esophagitis: Secondary | ICD-10-CM | POA: Diagnosis not present

## 2014-06-05 DIAGNOSIS — I10 Essential (primary) hypertension: Secondary | ICD-10-CM | POA: Insufficient documentation

## 2014-06-05 DIAGNOSIS — Z792 Long term (current) use of antibiotics: Secondary | ICD-10-CM | POA: Diagnosis not present

## 2014-06-05 DIAGNOSIS — Z720 Tobacco use: Secondary | ICD-10-CM | POA: Insufficient documentation

## 2014-06-05 DIAGNOSIS — Z3202 Encounter for pregnancy test, result negative: Secondary | ICD-10-CM | POA: Insufficient documentation

## 2014-06-05 DIAGNOSIS — Z79899 Other long term (current) drug therapy: Secondary | ICD-10-CM | POA: Diagnosis not present

## 2014-06-05 LAB — CBC WITH DIFFERENTIAL/PLATELET
Basophils Absolute: 0 10*3/uL (ref 0.0–0.1)
Basophils Relative: 0 % (ref 0–1)
EOS ABS: 0.1 10*3/uL (ref 0.0–0.7)
EOS PCT: 2 % (ref 0–5)
HEMATOCRIT: 40.1 % (ref 36.0–46.0)
Hemoglobin: 13.5 g/dL (ref 12.0–15.0)
Lymphocytes Relative: 25 % (ref 12–46)
Lymphs Abs: 2.1 10*3/uL (ref 0.7–4.0)
MCH: 33.3 pg (ref 26.0–34.0)
MCHC: 33.7 g/dL (ref 30.0–36.0)
MCV: 98.8 fL (ref 78.0–100.0)
MONO ABS: 0.6 10*3/uL (ref 0.1–1.0)
Monocytes Relative: 8 % (ref 3–12)
Neutro Abs: 5.5 10*3/uL (ref 1.7–7.7)
Neutrophils Relative %: 65 % (ref 43–77)
PLATELETS: 198 10*3/uL (ref 150–400)
RBC: 4.06 MIL/uL (ref 3.87–5.11)
RDW: 12.4 % (ref 11.5–15.5)
WBC: 8.4 10*3/uL (ref 4.0–10.5)

## 2014-06-05 LAB — I-STAT TROPONIN, ED: TROPONIN I, POC: 0 ng/mL (ref 0.00–0.08)

## 2014-06-05 LAB — URINALYSIS, ROUTINE W REFLEX MICROSCOPIC
GLUCOSE, UA: NEGATIVE mg/dL
KETONES UR: 15 mg/dL — AB
Nitrite: NEGATIVE
Protein, ur: 100 mg/dL — AB
Specific Gravity, Urine: 1.021 (ref 1.005–1.030)
Urobilinogen, UA: 1 mg/dL (ref 0.0–1.0)
pH: 6 (ref 5.0–8.0)

## 2014-06-05 LAB — COMPREHENSIVE METABOLIC PANEL
ALK PHOS: 87 U/L (ref 39–117)
ALT: 13 U/L (ref 0–35)
ANION GAP: 8 (ref 5–15)
AST: 21 U/L (ref 0–37)
Albumin: 3.2 g/dL — ABNORMAL LOW (ref 3.5–5.2)
BILIRUBIN TOTAL: 0.5 mg/dL (ref 0.3–1.2)
BUN: 12 mg/dL (ref 6–23)
CHLORIDE: 106 mmol/L (ref 96–112)
CO2: 24 mmol/L (ref 19–32)
CREATININE: 0.73 mg/dL (ref 0.50–1.10)
Calcium: 9 mg/dL (ref 8.4–10.5)
GLUCOSE: 97 mg/dL (ref 70–99)
POTASSIUM: 4 mmol/L (ref 3.5–5.1)
Sodium: 138 mmol/L (ref 135–145)
Total Protein: 6.4 g/dL (ref 6.0–8.3)

## 2014-06-05 LAB — URINE MICROSCOPIC-ADD ON

## 2014-06-05 LAB — PREGNANCY, URINE: Preg Test, Ur: NEGATIVE

## 2014-06-05 MED ORDER — SULFAMETHOXAZOLE-TRIMETHOPRIM 800-160 MG PO TABS: 1.0000 | ORAL_TABLET | Freq: Two times a day (BID) | ORAL | Status: DC

## 2014-06-05 MED ORDER — METOPROLOL SUCCINATE ER 25 MG PO TB24: 25.0000 mg | ORAL_TABLET | Freq: Every day | ORAL | Status: DC

## 2014-06-05 MED ORDER — HYDROCODONE-ACETAMINOPHEN 5-325 MG PO TABS: 1.0000 | ORAL_TABLET | ORAL | Status: DC | PRN

## 2014-06-05 MED ORDER — HYDROCODONE-ACETAMINOPHEN 5-325 MG PO TABS
2.0000 | ORAL_TABLET | Freq: Once | ORAL | Status: AC
  Administered 2014-06-05: 2 via ORAL
  Filled 2014-06-05: qty 2

## 2014-06-05 MED ORDER — LORAZEPAM 1 MG PO TABS
1.0000 mg | ORAL_TABLET | Freq: Once | ORAL | Status: AC
  Administered 2014-06-05: 1 mg via ORAL
  Filled 2014-06-05: qty 1

## 2014-06-05 MED ORDER — METOPROLOL SUCCINATE ER 25 MG PO TB24
25.0000 mg | ORAL_TABLET | Freq: Every day | ORAL | Status: DC
  Administered 2014-06-05: 25 mg via ORAL
  Filled 2014-06-05: qty 1

## 2014-06-05 NOTE — Discharge Instructions (Signed)
Tests were good. Will put you on a daily beta blocker to replace your propranolol.  Antibiotic for your boil.   soak in hot tub. Pain medication. Try to get a primary care relationship

## 2014-06-05 NOTE — ED Notes (Signed)
Pt made aware to return if symptoms worsen or if any life threatening symptoms occur.   

## 2014-06-05 NOTE — ED Notes (Signed)
MD Cook at the bedside.  

## 2014-06-05 NOTE — ED Notes (Addendum)
Pt presents to department for evaluation of chronic abdominal pain, also states diffuse chest pain and SOB x3 days. 8/10 pain upon arrival to ED. Pt is alert and oriented x4. Pt states she recently ran out of BP medication.

## 2014-06-05 NOTE — ED Provider Notes (Signed)
CSN: [id number]     Arrival date & time 06/05/14  1325 History   First MD Initiated Contact with Patient 06/05/14 1626     Chief Complaint  Patient presents with  . Abdominal Pain  . Chest Pain     (Consider location/radiation/quality/duration/timing/severity/associated sxs/prior Treatment) HPI..... Chief complaint is abdominal pain which is chronic. Patient recently moved here from [place] and does not have a primary care relationship. She has been on a beta blocker for some time for tachycardia, but has had to reduce the dose recently. She also complains of vague chest pain. She states many stressors including moving to a new state, taking care of her autistic daughter. Review of systems positive for a boil on her right buttocks.  No dyspnea, diaphoresis, nausea, fever, sweats, chills, dysuria  Past Medical History  Diagnosis Date  . Thyroid disease   . Arthritis   . Hypertension   . Acid reflux   . Cystitis    History reviewed. No pertinent past surgical history. No family history on file. History  Substance Use Topics  . Smoking status: Current Every Day Smoker    Types: Cigarettes  . Smokeless tobacco: Not on file  . Alcohol Use: Yes   OB History    No data available     Review of Systems  All other systems reviewed and are negative.     Allergies  Ibuprofen; Toradol; Aspirin; and Cleocin  Home Medications   Prior to Admission medications   Medication Sig Start Date End Date Taking? Authorizing Provider  ALPRAZolam Prudy Feeler) 0.5 MG tablet Take 0.5 mg by mouth 3 (three) times daily as needed for anxiety.   Yes Historical Provider, MD  aspirin-acetaminophen-caffeine (EXCEDRIN MIGRAINE) [phone] MG per tablet Take 1 tablet by mouth every 6 (six) hours as needed for headache.   Yes Historical Provider, MD  Chlorpheniramine-DM (CORICIDIN HBP COUGH/COLD PO) Take 1 tablet by mouth as needed (cough and cold like symptoms).   Yes Historical Provider, MD   dexamethasone (DECADRON) 1 MG tablet Take 1 mg by mouth 2 (two) times daily with a meal.   Yes Historical Provider, MD  furosemide (LASIX) 40 MG tablet Take 40 mg by mouth daily as needed for fluid.   Yes Historical Provider, MD  levonorgestrel-ethinyl estradiol (ENPRESSE,TRIVORA) tablet Take 1 tablet by mouth daily.   Yes Historical Provider, MD  levothyroxine (SYNTHROID, LEVOTHROID) 125 MCG tablet Take 125 mcg by mouth daily before breakfast.   Yes Historical Provider, MD  nortriptyline (PAMELOR) 75 MG capsule Take 75 mg by mouth at bedtime.   Yes Historical Provider, MD  pregabalin (LYRICA) 225 MG capsule Take 225 mg by mouth at bedtime.   Yes Historical Provider, MD  propranolol (INDERAL) 40 MG tablet Take 1 tablet (40 mg total) by mouth 2 (two) times daily. Patient taking differently: Take 20-40 mg by mouth 2 (two) times daily.  01/17/14  Yes Christopher Lawyer, PA-C  RABEprazole (ACIPHEX) 20 MG tablet Take 20 mg by mouth 2 (two) times daily.   Yes Historical Provider, MD  rizatriptan (MAXALT-MLT) 10 MG disintegrating tablet Take 1 tablet by mouth as needed. 03/11/14  Yes Historical Provider, MD  simethicone (MYLICON) 125 MG chewable tablet Chew 125 mg by mouth every 6 (six) hours as needed for flatulence.   Yes Historical Provider, MD  sucralfate (CARAFATE) 1 G tablet Take 1 tablet by mouth 4 (four) times daily. 03/11/14  Yes Historical Provider, MD  topiramate (TOPAMAX) 50 MG tablet Take 50 mg by mouth  at bedtime.   Yes Historical Provider, MD  cephALEXin (KEFLEX) 500 MG capsule Take 1 capsule (500 mg total) by mouth 4 (four) times daily. Patient not taking: Reported on 02/27/2014 11/14/13   Jerelyn Scott, MD  HYDROcodone-acetaminophen [facility]) 5-325 MG per tablet Take 1-2 tablets by mouth every 4 (four) hours as needed. 06/05/14   Donnetta Hutching, MD  metoprolol succinate (TOPROL-XL) 25 MG 24 hr tablet Take 1 tablet (25 mg total) by mouth daily. 06/05/14   Donnetta Hutching, MD  penicillin v potassium  (VEETID) 500 MG tablet Take 1 tablet (500 mg total) by mouth 4 (four) times daily. Patient not taking: Reported on 02/27/2014 01/17/14   Charlestine Night, PA-C  sucralfate (CARAFATE) 1 GM/10ML suspension Take 10 mLs (1 g total) by mouth 2 (two) times daily as needed (reflux). 02/28/14   Tomasita Crumble, MD  sulfamethoxazole-trimethoprim (SEPTRA DS) 800-160 MG per tablet Take 1 tablet by mouth every 12 (twelve) hours. 06/05/14   Donnetta Hutching, MD   BP 113/65 mmHg  Pulse 103  Temp(Src) 98 F (36.7 C) (Oral)  Resp 16  SpO2 98%  LMP 06/04/2014 Physical Exam  Constitutional: She is oriented to person, place, and time. She appears well-developed and well-nourished.  HENT:  Head: Normocephalic and atraumatic.  Eyes: Conjunctivae and EOM are normal. Pupils are equal, round, and reactive to light.  Neck: Normal range of motion. Neck supple.  Cardiovascular: Normal rate and regular rhythm.   Pulmonary/Chest: Effort normal and breath sounds normal.  Abdominal: Soft. Bowel sounds are normal.  Musculoskeletal: Normal range of motion.  Neurological: She is alert and oriented to person, place, and time.  Skin: Skin is warm and dry.  Small abscess approximately 0.75 cm in diameter on medial aspect of left buttocks  Psychiatric: She has a normal mood and affect. Her behavior is normal.  Nursing note and vitals reviewed.   ED Course  Procedures (including critical care time) Labs Review Labs Reviewed  COMPREHENSIVE METABOLIC PANEL - Abnormal; Notable for the following:    Albumin 3.2 (*)    All other components within normal limits  URINALYSIS, ROUTINE W REFLEX MICROSCOPIC - Abnormal; Notable for the following:    Color, Urine RED (*)    APPearance CLOUDY (*)    Hgb urine dipstick LARGE (*)    Bilirubin Urine MODERATE (*)    Ketones, ur 15 (*)    Protein, ur 100 (*)    Leukocytes, UA SMALL (*)    All other components within normal limits  URINE MICROSCOPIC-ADD ON - Abnormal; Notable for the  following:    Bacteria, UA FEW (*)    All other components within normal limits  CBC WITH DIFFERENTIAL/PLATELET  PREGNANCY, URINE  I-STAT TROPOININ, ED    Imaging Review Dg Chest 2 View  06/05/2014   CLINICAL DATA:  Chest pain with shortness of breath as well as abdominal pain for the past 2 days ; 1 month history of cough and congestion; current tobacco use  EXAM: CHEST  2 VIEW  COMPARISON:  None.  FINDINGS: The lungs are adequately inflated. There is no focal infiltrate. The interstitial markings are coarse. There is no pleural effusion or pneumothorax. The heart and mediastinal structures are normal. The bony thorax exhibits no acute abnormality.  IMPRESSION: No active cardiopulmonary disease.   Electronically Signed   By: David  [place]   On: 06/05/2014 14:30     EKG Interpretation   Date/Time:  Friday June 05 2014 13:30:42 EDT Ventricular Rate:  93  PR Interval:  152 QRS Duration: 72 QT Interval:  344 QTC Calculation: 427 R Axis:   26 Text Interpretation:  Normal sinus rhythm Cannot rule out Anterior infarct  , age undetermined T wave abnormality, consider inferior ischemia Abnormal  ECG Confirmed by Nyaira Hodgens  MD, Deshawn Skelley ([id number]) on 06/05/2014 5:37:46 PM      MDM   Final diagnoses:  Abdominal pain, unspecified abdominal location  Tachycardia  Abscess of buttock, right    Patient is in no acute distress. Will restart a beta blocker. I recommended a once daily preparation metoprolol extended release 25 mg daily. Also prescriptions for Norco No. 15 and Bactrim DS twice a day for 1 week. She will seek primary care follow-up    Donnetta HutchingBrian Kendell Gammon, MD 06/05/14 2004

## 2014-06-10 ENCOUNTER — Encounter: Payer: Self-pay | Admitting: Family Medicine

## 2014-06-10 DIAGNOSIS — M1990 Unspecified osteoarthritis, unspecified site: Secondary | ICD-10-CM | POA: Insufficient documentation

## 2014-06-10 DIAGNOSIS — K219 Gastro-esophageal reflux disease without esophagitis: Secondary | ICD-10-CM | POA: Insufficient documentation

## 2014-06-10 DIAGNOSIS — I10 Essential (primary) hypertension: Secondary | ICD-10-CM | POA: Insufficient documentation

## 2014-06-10 DIAGNOSIS — D649 Anemia, unspecified: Secondary | ICD-10-CM | POA: Insufficient documentation

## 2014-06-10 DIAGNOSIS — G709 Myoneural disorder, unspecified: Secondary | ICD-10-CM | POA: Insufficient documentation

## 2014-06-10 DIAGNOSIS — F419 Anxiety disorder, unspecified: Secondary | ICD-10-CM | POA: Insufficient documentation

## 2014-06-10 DIAGNOSIS — B977 Papillomavirus as the cause of diseases classified elsewhere: Secondary | ICD-10-CM | POA: Insufficient documentation

## 2014-06-10 DIAGNOSIS — M810 Age-related osteoporosis without current pathological fracture: Secondary | ICD-10-CM | POA: Insufficient documentation

## 2014-06-17 ENCOUNTER — Encounter: Payer: Self-pay | Admitting: Physician Assistant

## 2014-06-17 ENCOUNTER — Ambulatory Visit (INDEPENDENT_AMBULATORY_CARE_PROVIDER_SITE_OTHER): Payer: Medicare Other | Admitting: Physician Assistant

## 2014-06-17 VITALS — BP 118/82 | HR 88 | Temp 97.8°F | Resp 18 | Ht 61.0 in | Wt 198.0 lb

## 2014-06-17 DIAGNOSIS — M25561 Pain in right knee: Secondary | ICD-10-CM | POA: Diagnosis not present

## 2014-06-17 DIAGNOSIS — E538 Deficiency of other specified B group vitamins: Secondary | ICD-10-CM | POA: Diagnosis not present

## 2014-06-17 DIAGNOSIS — R490 Dysphonia: Secondary | ICD-10-CM | POA: Insufficient documentation

## 2014-06-17 DIAGNOSIS — Z9884 Bariatric surgery status: Secondary | ICD-10-CM | POA: Diagnosis not present

## 2014-06-17 DIAGNOSIS — K219 Gastro-esophageal reflux disease without esophagitis: Secondary | ICD-10-CM

## 2014-06-17 DIAGNOSIS — E039 Hypothyroidism, unspecified: Secondary | ICD-10-CM | POA: Insufficient documentation

## 2014-06-17 DIAGNOSIS — D509 Iron deficiency anemia, unspecified: Secondary | ICD-10-CM | POA: Diagnosis not present

## 2014-06-17 DIAGNOSIS — M1990 Unspecified osteoarthritis, unspecified site: Secondary | ICD-10-CM

## 2014-06-17 DIAGNOSIS — E559 Vitamin D deficiency, unspecified: Secondary | ICD-10-CM | POA: Diagnosis not present

## 2014-06-17 DIAGNOSIS — G709 Myoneural disorder, unspecified: Secondary | ICD-10-CM

## 2014-06-17 DIAGNOSIS — I10 Essential (primary) hypertension: Secondary | ICD-10-CM | POA: Diagnosis not present

## 2014-06-17 DIAGNOSIS — R87619 Unspecified abnormal cytological findings in specimens from cervix uteri: Secondary | ICD-10-CM | POA: Insufficient documentation

## 2014-06-17 DIAGNOSIS — D649 Anemia, unspecified: Secondary | ICD-10-CM

## 2014-06-17 DIAGNOSIS — B977 Papillomavirus as the cause of diseases classified elsewhere: Secondary | ICD-10-CM

## 2014-06-17 DIAGNOSIS — M810 Age-related osteoporosis without current pathological fracture: Secondary | ICD-10-CM | POA: Diagnosis not present

## 2014-06-17 DIAGNOSIS — M797 Fibromyalgia: Secondary | ICD-10-CM | POA: Diagnosis not present

## 2014-06-17 DIAGNOSIS — F419 Anxiety disorder, unspecified: Secondary | ICD-10-CM

## 2014-06-17 DIAGNOSIS — R109 Unspecified abdominal pain: Secondary | ICD-10-CM

## 2014-06-17 DIAGNOSIS — G8929 Other chronic pain: Secondary | ICD-10-CM | POA: Insufficient documentation

## 2014-06-17 DIAGNOSIS — J382 Nodules of vocal cords: Secondary | ICD-10-CM | POA: Diagnosis not present

## 2014-06-17 DIAGNOSIS — M25562 Pain in left knee: Secondary | ICD-10-CM

## 2014-06-17 MED ORDER — LEVOTHYROXINE SODIUM 125 MCG PO TABS: 125.0000 ug | ORAL_TABLET | Freq: Every day | ORAL | Status: DC

## 2014-06-17 MED ORDER — RABEPRAZOLE SODIUM 20 MG PO TBEC: 20.0000 mg | DELAYED_RELEASE_TABLET | Freq: Two times a day (BID) | ORAL | Status: DC

## 2014-06-17 MED ORDER — HYDROCODONE-ACETAMINOPHEN 5-325 MG PO TABS: 1.0000 | ORAL_TABLET | ORAL | Status: DC | PRN

## 2014-06-17 MED ORDER — PREGABALIN 225 MG PO CAPS: 225.0000 mg | ORAL_CAPSULE | Freq: Two times a day (BID) | ORAL | Status: DC

## 2014-06-17 NOTE — Progress Notes (Signed)
Patient ID: Dorothy Dyer MRN: [id number], DOB: 1975/03/27, 39 y.o. Date of Encounter: @  Chief Complaint:  Chief Complaint  Patient presents with  . new pt est care    hospital follow up    HPI: 39 y.o. year old white female  presents as a new patient to establish care.  She states that she moved here from [place] in August 2015. She says that her brother and sister-in-law live here.  Says that she just got her insurance changed to where she can see PCP in [place]. Says that her primary care provider in [place] continued all of her refills up until now except for----Lyrica which her Rheumatologist was prescribing--- she has been out of this for 1-1/2 months                 ----Vicodin 10 mg 5 times a day------- this was being prescribed by the pain clinic. She has been out of this for a                                                                                                                                                                     long time.                      --however when she went to the ER recently 06/05/14 they did give her some hydrocodone 5 mg #15 to be                        using to hold her over until she could be established here.  She says that she was on the Vicodin because of abdominal pain. She says "The surgeon said that my intestines were bruising in his hands----thinks it's nerve damage" Says that she needs a referral to GI. Says that she has been to the [facility] and has seen every high end specialist possible. They have tried Splenic nerve blocks. She has seen GI specialist and also pain clinics. Still eating is very painful every time she uses the bathroom is very painful.  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 Says that she had was told that the gastric bypass would treat her reflux but instead she says that she still has very  bad reflux. Says that she absolutely ha to take the Aciphex BID--says he will probably get a                         prior authorization for this but that we have to approve it for twice a day dosing. Says that the  gastric bypass surgery has only caused even more problems. Says now she is still stuck with this bad reflux and hoarseness. Also since the gastric bypass she has had severe iron deficiency anemia and has required IV iron infusions. Has had B12 deficiency and has been getting B12 injections but has not had one in many months now since moving. Also vitamin D deficiency.  Says that she has had every GI test imaginable. Had multiple endoscopies. Has had ERCP. Colonoscopy. Manometry. Had "digestion study "and saw a motility specialist.  Says that the rheumatologist did a scan of her right knee. Patient was told that   " they had never seen an knee look like that at her age unless there had been severe injury."  She did have follow-up with orthopedics and they told her it was because she was not getting adequate blood flow to the knee and needs total knee replacement. However she never had follow-up with them.  Also rheumatologist also scanned her pelvis and hip region which showed degenerative changes.  She shows me her pinky finger. Says that when the doctor was putting stitches in it he said he had never seen anything like it and had never seen tissue form scar so quickly----that finger is now drawn up and in a flexed position and she cannot straighten it.  Says that more recently the Rheumatologist felt that she may have psoriatic arthritis because she was having patches on her skin.  Also says that just prior to moving they had noticed that she had darkened area of skin just above her lip and had recommended she see a dermatologist but she never got around to that.  Says that her lupus test was negative ANA was negative. However some immunology tests showed immune problems. She saw an  immunologist once. He diagnosed it as "undefined immune " and she was to have follow-up evaluation but did not get around to this.  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 Says that she had high cortisol level. Has buffalo hump and fat deposits and other characteristics consistent with Cushing's. They were in the midst of doing other testing when she had to move here.  She also then talks about her lower legs. Says that she saw a specialist for this and was diagnosed with venous reflux. Was told that she needed 4 veins replaced and she had one of them done but has not had the other surgeries.  She says that in addition to all of this she also has a lot of personal stress. Her son is autistic. Says that he is always screaming and yelling and hits her and bangs into the walls.  Says that is one of the main reasons that she moved here from [place]. Says that [place] has much better respit care. She says that it is basically a babysitter provided by the state for her son. Says that she is in the process of getting this. Says that currently he is in school until May. Therefore, she is available to go to follow-up appointments while he is in school.  However she is still concerned because she has to pay a $40 co-pay for each specialist visit. As long as he is in school --through May-- she can go to appointments in the morning time but needs to spread them out because of the issue of co-pays.  At the end of the visit she also says that she really needs to go ahead and  see a gynecologist. Says that she has had multiple abnormal Pap smears and colposcopies and is overdue for this.  States that her medical problems started at age 74. Prior to age 72 had no medical problems and was taking no medications.   Past Medical History  Diagnosis Date  . Thyroid disease   . Cystitis   . Anemia   . Anxiety   . Arthritis   . Clotting disorder      undetermined  . Acid reflux   . Neuromuscular disorder   . Hypertension   . Osteoporosis   . 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 aspirin-acetaminophen-caffeine (EXCEDRIN MIGRAINE) 250-250-65 MG per tablet Take 1 tablet by mouth every 6 (six) hours as needed for headache.    . Chlorpheniramine-DM (CORICIDIN HBP COUGH/COLD PO) Take 1 tablet by mouth as needed (cough and cold like symptoms).    . furosemide (LASIX) 40 MG tablet Take 40 mg by mouth daily as needed for fluid.    Marland Kitchen levonorgestrel-ethinyl estradiol (ENPRESSE,TRIVORA) tablet Take 1 tablet by mouth daily.    . metoprolol succinate (TOPROL-XL) 25 MG 24 hr tablet Take 1 tablet (25 mg total) by mouth daily. 30 tablet 1  . nortriptyline (PAMELOR) 75 MG capsule Take 75 mg by mouth at bedtime.    . polyethylene glycol (MIRALAX / GLYCOLAX) packet Take 17 g by mouth daily as needed.    . potassium chloride SA (K-DUR,KLOR-CON) 20 MEQ tablet Take 20 mEq by mouth 2 (two) times daily.    . rizatriptan (MAXALT-MLT) 10 MG disintegrating tablet Take 1 tablet by mouth as needed.  0  . simethicone (MYLICON) 125 MG chewable tablet Chew 125 mg by mouth every 6 (six) hours as needed for flatulence.    . sucralfate (CARAFATE) 1 G tablet Take 1 tablet by mouth 4 (four) times daily.  3  . topiramate (TOPAMAX) 50 MG tablet Take 50 mg by mouth at bedtime.    Marland Kitchen HYDROcodone-acetaminophen (NORCO) 5-325 MG per tablet Take 1-2 tablets by mouth every 4 (four) hours as needed. 15 tablet 0  . levothyroxine (SYNTHROID, LEVOTHROID) 125 MCG tablet Take 125 mcg by mouth daily before breakfast.    . pregabalin (LYRICA) 225 MG capsule Take 225 mg by mouth at bedtime.    . RABEprazole (ACIPHEX) 20 MG tablet Take 20 mg by mouth 2 (two) times daily.    . sucralfate (CARAFATE) 1 GM/10ML suspension Take 10 mLs (1 g total) by mouth 2 (two) times daily as needed (reflux). 100 mL  0  . ALPRAZolam (XANAX) 0.5 MG tablet Take 0.5 mg by mouth 3 (three) times daily as needed for anxiety.    Marland Kitchen dexamethasone (DECADRON) 1 MG tablet Take 1 mg by mouth 2 (two) times daily with a meal.    . propranolol (INDERAL) 40 MG tablet Take 1 tablet (40 mg total) by mouth 2 (two) times daily. (Patient not taking: Reported on 06/17/2014) 60 tablet 0  . cephALEXin (KEFLEX) 500 MG capsule Take 1 capsule (500 mg total) by mouth 4 (four) times daily. (Patient not taking: Reported on 02/27/2014) 28 capsule 0  . penicillin v potassium (VEETID) 500 MG tablet Take 1 tablet (500 mg total) by mouth 4 (four) times daily. (Patient not taking: Reported on 02/27/2014) 28 tablet 0  . sulfamethoxazole-trimethoprim (SEPTRA DS) 800-160 MG per tablet Take 1 tablet by mouth every 12 (twelve) hours. 14 tablet  0   No facility-administered medications prior to visit.    Allergies:  Allergies  Allergen Reactions  . Ibuprofen Nausea Only    Bad stomach pains  . Toradol [Ketorolac Tromethamine] Anxiety  . Aspirin Hives  . Cleocin [Clindamycin Hcl] Itching and Rash    History   Social History  . Marital Status: Divorced    Spouse Name: N/A  . Number of Children: N/A  . Years of Education: N/A   Occupational History  . Not on file.   Social History Main Topics  . Smoking status: Current Every Day Smoker -- 0.50 packs/day    Types: Cigarettes  . Smokeless tobacco: Never Used  . Alcohol Use: 6.0 oz/week    10 Shots of liquor per week  . Drug Use: No  . Sexual Activity: Yes    Birth Control/ Protection: Pill   Other Topics Concern  . Not on file   Social History Narrative    Family History  Problem Relation Age of Onset  . Depression Mother   . Diabetes Mother   . Hyperlipidemia Mother   . Depression Father   . Hearing loss Father   . Hyperlipidemia Father   . Hypertension Father   . Learning disabilities Father   . Mental illness Father   . Asthma Brother   . Alcohol abuse Maternal  Grandmother   . Arthritis Maternal Grandmother   . Cancer Maternal Grandmother   . Cancer Maternal Grandfather   . Mental illness Paternal Grandmother   . Heart disease Paternal Grandfather   . Stroke Paternal Grandfather      Review of Systems:  See HPI for pertinent ROS. All other ROS negative.    Physical Exam: Blood pressure 118/82, pulse 88, temperature 97.8 F (36.6 C), temperature source Oral, resp. rate 18, height 5\' 1"  (1.549 m), weight 198 lb (89.812 kg), last menstrual period 06/04/2014., Body mass index is 37.43 kg/(m^2). General: Obese WF. Appears in no acute distress. Neck: Supple. No thyromegaly. No lymphadenopathy. Lungs: Clear bilaterally to auscultation without wheezes, rales, or rhonchi. Breathing is unlabored. Heart: RRR with S1 S2. No murmurs, rubs, or gallops. Musculoskeletal:  Strength and tone normal for age. Extremities/Skin: Warm and dry. She does have mild edema and changes in color of skin-brawny. Neuro: Alert and oriented X 3. Moves all extremities spontaneously. Gait is normal. CNII-XII grossly in tact. Psych:  Responds to questions appropriately with a normal affect.     ASSESSMENT AND PLAN:  39 y.o. year old female with   1. H/O gastric bypass - Anemia panel - Vit D  25 hydroxy (rtn osteoporosis monitoring) - Ambulatory referral to Gastroenterology  2. Hoarseness - Ambulatory referral to Gastroenterology  3. Vocal cord nodule - Ambulatory referral to Gastroenterology  4. Fibromyalgia - pregabalin (LYRICA) 225 MG capsule; Take 1 capsule (225 mg total) by mouth 2 (two) times daily.  Dispense: 60 capsule; Refill: 3 - HYDROcodone-acetaminophen (NORCO) 5-325 MG per tablet; Take 1 tablet by mouth every 4 (four) hours as needed.  Dispense: 60 tablet; Refill: 0  5. Chronic abdominal pain - Ambulatory referral to Gastroenterology - Ambulatory referral to Pain Clinic  6. Knee pain, bilateral - HYDROcodone-acetaminophen (NORCO) 5-325 MG per  tablet; Take 1 tablet by mouth every 4 (four) hours as needed.  Dispense: 60 tablet; Refill: 0 - Ambulatory referral to Orthopedic Surgery  7. Vitamin D deficiency - Vit D  25 hydroxy (rtn osteoporosis monitoring)  8. Vitamin B12 deficiency - Anemia panel  9. 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 Osteoporosis  12. Essential hypertension - COMPLETE METABOLIC PANEL WITH GFR  13. Neuromuscular disorder - HYDROcodone-acetaminophen (NORCO) 5-325 MG per tablet; Take 1 tablet by mouth every 4 (four) hours as needed.  Dispense: 60 tablet; Refill: 0 - Ambulatory referral to Gastroenterology  14. Gastroesophageal reflux disease, esophagitis presence not specified - RABEprazole (ACIPHEX) 20 MG tablet; Take 1 tablet (20 mg total) by mouth 2 (two) times daily.  Dispense: 60 tablet; Refill: 5 - Ambulatory referral to Gastroenterology  15. Arthritis - COMPLETE METABOLIC PANEL WITH GFR  16. Anxiety  17. Anemia, unspecified anemia type - Anemia panel  18. Hypothyroidism, unspecified hypothyroidism type - TSH - levothyroxine (SYNTHROID, LEVOTHROID) 125 MCG tablet; Take 1 tablet (125 mcg total) by mouth daily before breakfast.  Dispense: 30 tablet; Refill: 5  19. Abnormal cervical Papanicolaou smear, unspecified abnormal pap finding - Ambulatory referral to Gynecology   Will obtain labs. Will place orders for referrals to pain clinic, GI, orthopedics, GYN. Will have her schedule follow-up office visit here in our office in just a couple weeks with a physician to establish with them and have further follow-up at that time.  Signed, [address] CourtMary Beth Port PennDixon, GeorgiaPA, [facility] 06/17/2014 1:55 PM

## 2014-06-18 ENCOUNTER — Emergency Department (HOSPITAL_COMMUNITY)
Admission: EM | Admit: 2014-06-18 | Discharge: 2014-06-18 | Disposition: A | Discharge status: Home / Self Care | Payer: Medicare Other | Attending: Emergency Medicine | Admitting: Emergency Medicine

## 2014-06-18 ENCOUNTER — Telehealth: Payer: Self-pay | Admitting: Internal Medicine

## 2014-06-18 ENCOUNTER — Emergency Department (HOSPITAL_COMMUNITY): Payer: Medicare Other

## 2014-06-18 ENCOUNTER — Encounter (HOSPITAL_COMMUNITY): Payer: Self-pay | Admitting: Emergency Medicine

## 2014-06-18 DIAGNOSIS — K219 Gastro-esophageal reflux disease without esophagitis: Secondary | ICD-10-CM | POA: Diagnosis not present

## 2014-06-18 DIAGNOSIS — Z8619 Personal history of other infectious and parasitic diseases: Secondary | ICD-10-CM | POA: Insufficient documentation

## 2014-06-18 DIAGNOSIS — Z9049 Acquired absence of other specified parts of digestive tract: Secondary | ICD-10-CM | POA: Insufficient documentation

## 2014-06-18 DIAGNOSIS — K529 Noninfective gastroenteritis and colitis, unspecified: Secondary | ICD-10-CM | POA: Insufficient documentation

## 2014-06-18 DIAGNOSIS — M1990 Unspecified osteoarthritis, unspecified site: Secondary | ICD-10-CM | POA: Diagnosis not present

## 2014-06-18 DIAGNOSIS — R1084 Generalized abdominal pain: Secondary | ICD-10-CM

## 2014-06-18 DIAGNOSIS — I10 Essential (primary) hypertension: Secondary | ICD-10-CM | POA: Insufficient documentation

## 2014-06-18 DIAGNOSIS — Z862 Personal history of diseases of the blood and blood-forming organs and certain disorders involving the immune mechanism: Secondary | ICD-10-CM | POA: Diagnosis not present

## 2014-06-18 DIAGNOSIS — Z79899 Other long term (current) drug therapy: Secondary | ICD-10-CM | POA: Insufficient documentation

## 2014-06-18 DIAGNOSIS — F419 Anxiety disorder, unspecified: Secondary | ICD-10-CM | POA: Diagnosis not present

## 2014-06-18 DIAGNOSIS — Z3202 Encounter for pregnancy test, result negative: Secondary | ICD-10-CM | POA: Diagnosis not present

## 2014-06-18 DIAGNOSIS — E079 Disorder of thyroid, unspecified: Secondary | ICD-10-CM | POA: Insufficient documentation

## 2014-06-18 DIAGNOSIS — Z720 Tobacco use: Secondary | ICD-10-CM | POA: Diagnosis not present

## 2014-06-18 LAB — CBC WITH DIFFERENTIAL/PLATELET
Basophils Absolute: 0 10*3/uL (ref 0.0–0.1)
Basophils Relative: 0 % (ref 0–1)
EOS ABS: 0 10*3/uL (ref 0.0–0.7)
EOS PCT: 0 % (ref 0–5)
HCT: 44.3 % (ref 36.0–46.0)
Hemoglobin: 14.7 g/dL (ref 12.0–15.0)
LYMPHS ABS: 1.1 10*3/uL (ref 0.7–4.0)
Lymphocytes Relative: 8 % — ABNORMAL LOW (ref 12–46)
MCH: 33 pg (ref 26.0–34.0)
MCHC: 33.2 g/dL (ref 30.0–36.0)
MCV: 99.6 fL (ref 78.0–100.0)
Monocytes Absolute: 0.5 10*3/uL (ref 0.1–1.0)
Monocytes Relative: 4 % (ref 3–12)
Neutro Abs: 11.3 10*3/uL — ABNORMAL HIGH (ref 1.7–7.7)
Neutrophils Relative %: 88 % — ABNORMAL HIGH (ref 43–77)
PLATELETS: 210 10*3/uL (ref 150–400)
RBC: 4.45 MIL/uL (ref 3.87–5.11)
RDW: 12.6 % (ref 11.5–15.5)
WBC: 12.9 10*3/uL — ABNORMAL HIGH (ref 4.0–10.5)

## 2014-06-18 LAB — COMPLETE METABOLIC PANEL WITH GFR
ALK PHOS: 101 U/L (ref 39–117)
ALT: 13 U/L (ref 0–35)
AST: 19 U/L (ref 0–37)
Albumin: 3.8 g/dL (ref 3.5–5.2)
BUN: 17 mg/dL (ref 6–23)
CALCIUM: 8.4 mg/dL (ref 8.4–10.5)
CHLORIDE: 107 meq/L (ref 96–112)
CO2: 22 mEq/L (ref 19–32)
Creat: 0.87 mg/dL (ref 0.50–1.10)
GFR, Est African American: >89 mL/min
GFR, Est Non African American: 84 mL/min
Glucose, Bld: 77 mg/dL (ref 70–99)
Potassium: 4.6 mEq/L (ref 3.5–5.3)
Sodium: 139 mEq/L (ref 135–145)
TOTAL PROTEIN: 6.3 g/dL (ref 6.0–8.3)
Total Bilirubin: 0.4 mg/dL (ref 0.2–1.2)

## 2014-06-18 LAB — ANEMIA PANEL
%SAT: 38 % (ref 20–55)
ABS RETIC: 62.9 10*3/uL (ref 19.0–186.0)
Ferritin: 65 ng/mL (ref 10–291)
Folate: 5.7 ng/mL
Iron: 170 ug/dL — ABNORMAL HIGH (ref 42–145)
RBC.: 4.19 MIL/uL (ref 3.87–5.11)
RETIC CT PCT: 1.5 % (ref 0.4–2.3)
TIBC: 445 ug/dL (ref 250–470)
UIBC: 275 ug/dL (ref 125–400)
Vitamin B-12: 201 pg/mL — ABNORMAL LOW (ref 211–911)

## 2014-06-18 LAB — URINALYSIS, ROUTINE W REFLEX MICROSCOPIC
BILIRUBIN URINE: NEGATIVE
GLUCOSE, UA: NEGATIVE mg/dL
Hgb urine dipstick: NEGATIVE
Ketones, ur: NEGATIVE mg/dL
Nitrite: NEGATIVE
Protein, ur: NEGATIVE mg/dL
Specific Gravity, Urine: 1.024 (ref 1.005–1.030)
Urobilinogen, UA: 1 mg/dL (ref 0.0–1.0)
pH: 5.5 (ref 5.0–8.0)

## 2014-06-18 LAB — COMPREHENSIVE METABOLIC PANEL
ALBUMIN: 3.9 g/dL (ref 3.5–5.2)
ALT: 21 U/L (ref 0–35)
ANION GAP: 10 (ref 5–15)
AST: 37 U/L (ref 0–37)
Alkaline Phosphatase: 113 U/L (ref 39–117)
BUN: 15 mg/dL (ref 6–23)
CALCIUM: 9 mg/dL (ref 8.4–10.5)
CHLORIDE: 103 mmol/L (ref 96–112)
CO2: 22 mmol/L (ref 19–32)
Creatinine, Ser: 0.74 mg/dL (ref 0.50–1.10)
GFR calc Af Amer: >90 mL/min (ref >90)
GFR calc non Af Amer: >90 mL/min (ref >90)
Glucose, Bld: 115 mg/dL — ABNORMAL HIGH (ref 70–99)
Potassium: 4.7 mmol/L (ref 3.5–5.1)
SODIUM: 135 mmol/L (ref 135–145)
TOTAL PROTEIN: 7.1 g/dL (ref 6.0–8.3)
Total Bilirubin: 1 mg/dL (ref 0.3–1.2)

## 2014-06-18 LAB — URINE MICROSCOPIC-ADD ON

## 2014-06-18 LAB — LACTIC ACID, PLASMA: LACTIC ACID, VENOUS: 1.6 mmol/L (ref 0.5–2.0)

## 2014-06-18 LAB — LIPASE, BLOOD: Lipase: 51 U/L (ref 11–59)

## 2014-06-18 LAB — VITAMIN D 25 HYDROXY (VIT D DEFICIENCY, FRACTURES): VIT D 25 HYDROXY: 4 ng/mL — AB (ref 30–100)

## 2014-06-18 LAB — HCG, QUANTITATIVE, PREGNANCY: hCG, Beta Chain, Quant, S: 1 m[IU]/mL (ref <5)

## 2014-06-18 LAB — TSH: TSH: 1.605 u[IU]/mL (ref 0.350–4.500)

## 2014-06-18 MED ORDER — OXYCODONE-ACETAMINOPHEN 5-325 MG PO TABS
1.0000 | ORAL_TABLET | Freq: Once | ORAL | Status: AC
  Administered 2014-06-18: 1 via ORAL
  Filled 2014-06-18: qty 1

## 2014-06-18 MED ORDER — FENTANYL CITRATE 0.05 MG/ML IJ SOLN
100.0000 ug | INTRAMUSCULAR | Status: DC | PRN
  Administered 2014-06-18 (×3): 100 ug via INTRAVENOUS
  Filled 2014-06-18 (×3): qty 2

## 2014-06-18 MED ORDER — IOHEXOL 300 MG/ML  SOLN
80.0000 mL | Freq: Once | INTRAMUSCULAR | Status: AC | PRN
  Administered 2014-06-18: 80 mL via INTRAVENOUS

## 2014-06-18 MED ORDER — ONDANSETRON HCL 4 MG PO TABS
4.0000 mg | ORAL_TABLET | Freq: Three times a day (TID) | ORAL | Status: DC | PRN
Start: 2014-06-18 — End: 2014-07-27

## 2014-06-18 MED ORDER — ONDANSETRON 4 MG PO TBDP
4.0000 mg | ORAL_TABLET | Freq: Once | ORAL | Status: AC
  Administered 2014-06-18: 4 mg via ORAL
  Filled 2014-06-18: qty 1

## 2014-06-18 MED ORDER — METRONIDAZOLE 500 MG PO TABS
500.0000 mg | ORAL_TABLET | Freq: Once | ORAL | Status: AC
  Administered 2014-06-18: 500 mg via ORAL
  Filled 2014-06-18: qty 1

## 2014-06-18 MED ORDER — OXYCODONE-ACETAMINOPHEN 5-325 MG PO TABS: 1.0000 | ORAL_TABLET | Freq: Four times a day (QID) | ORAL | Status: DC | PRN

## 2014-06-18 MED ORDER — ONDANSETRON HCL 4 MG/2ML IJ SOLN
4.0000 mg | Freq: Once | INTRAMUSCULAR | Status: AC
  Administered 2014-06-18: 4 mg via INTRAVENOUS
  Filled 2014-06-18: qty 2

## 2014-06-18 MED ORDER — CIPROFLOXACIN HCL 500 MG PO TABS: 500.0000 mg | ORAL_TABLET | Freq: Once | ORAL | Status: DC

## 2014-06-18 MED ORDER — IOHEXOL 300 MG/ML  SOLN
25.0000 mL | INTRAMUSCULAR | Status: AC
  Administered 2014-06-18: 25 mL via ORAL

## 2014-06-18 MED ORDER — MORPHINE SULFATE 4 MG/ML IJ SOLN
4.0000 mg | Freq: Once | INTRAMUSCULAR | Status: DC
  Filled 2014-06-18: qty 1

## 2014-06-18 MED ORDER — CIPROFLOXACIN HCL 500 MG PO TABS
500.0000 mg | ORAL_TABLET | Freq: Once | ORAL | Status: AC
  Administered 2014-06-18: 500 mg via ORAL
  Filled 2014-06-18: qty 1

## 2014-06-18 MED ORDER — METRONIDAZOLE 500 MG PO TABS: 500.0000 mg | ORAL_TABLET | Freq: Two times a day (BID) | ORAL | Status: DC

## 2014-06-18 NOTE — Telephone Encounter (Signed)
Patient has referral in Epic.  Can you have Dr. Rhea BeltonPyrtle (doc of day) review the [facility].  This patient has a very extensive GI hx from [facility], and I want to make sure that we could accommodate this patients needs before we schedule.  Thanks

## 2014-06-18 NOTE — ED Notes (Signed)
Called radiology for pt transfer to CT.

## 2014-06-18 NOTE — ED Notes (Signed)
Woke up at 5 with belly pain shooting through back; tearful. Hx of SBO. Sharp pain; nausea and dry heaving.

## 2014-06-18 NOTE — Discharge Instructions (Signed)
You were seen today for abdominal pain. He had thickening of your bowel which is consistent with inflammation versus infection. You'll be placed on antibiotics. You need to follow-up with her primary physician and GI given your past history. He has any new or worsening symptoms she should be reevaluated immediately.  Abdominal Pain, Women Abdominal (stomach, pelvic, or belly) pain can be caused by many things. It is important to tell your doctor:  The location of the pain.  Does it come and go or is it present all the time?  Are there things that start the pain (eating certain foods, exercise)?  Are there other symptoms associated with the pain (fever, nausea, vomiting, diarrhea)? All of this is helpful to know when trying to find the cause of the pain. CAUSES   Stomach: virus or bacteria infection, or ulcer.  Intestine: appendicitis (inflamed appendix), regional ileitis (Crohn's disease), ulcerative colitis (inflamed colon), irritable bowel syndrome, diverticulitis (inflamed diverticulum of the colon), or cancer of the stomach or intestine.  Gallbladder disease or stones in the gallbladder.  Kidney disease, kidney stones, or infection.  Pancreas infection or cancer.  Fibromyalgia (pain disorder).  Diseases of the female organs:  Uterus: fibroid (non-cancerous) tumors or infection.  Fallopian tubes: infection or tubal pregnancy.  Ovary: cysts or tumors.  Pelvic adhesions (scar tissue).  Endometriosis (uterus lining tissue growing in the pelvis and on the pelvic organs).  Pelvic congestion syndrome (female organs filling up with blood just before the menstrual period).  Pain with the menstrual period.  Pain with ovulation (producing an egg).  Pain with an IUD (intrauterine device, birth control) in the uterus.  Cancer of the female organs.  Functional pain (pain not caused by a disease, may improve without treatment).  Psychological pain.  Depression. DIAGNOSIS    Your doctor will decide the seriousness of your pain by doing an examination.  Blood tests.  X-rays.  Ultrasound.  CT scan (computed tomography, special type of X-ray).  MRI (magnetic resonance imaging).  Cultures, for infection.  Barium enema (dye inserted in the large intestine, to better view it with X-rays).  Colonoscopy (looking in intestine with a lighted tube).  Laparoscopy (minor surgery, looking in abdomen with a lighted tube).  Major abdominal exploratory surgery (looking in abdomen with a large incision). TREATMENT  The treatment will depend on the cause of the pain.   Many cases can be observed and treated at home.  Over-the-counter medicines recommended by your caregiver.  Prescription medicine.  Antibiotics, for infection.  Birth control pills, for painful periods or for ovulation pain.  Hormone treatment, for endometriosis.  Nerve blocking injections.  Physical therapy.  Antidepressants.  Counseling with a psychologist or psychiatrist.  Minor or major surgery. HOME CARE INSTRUCTIONS   Do not take laxatives, unless directed by your caregiver.  Take over-the-counter pain medicine only if ordered by your caregiver. Do not take aspirin because it can cause an upset stomach or bleeding.  Try a clear liquid diet (broth or water) as ordered by your caregiver. Slowly move to a bland diet, as tolerated, if the pain is related to the stomach or intestine.  Have a thermometer and take your temperature several times a day, and record it.  Bed rest and sleep, if it helps the pain.  Avoid sexual intercourse, if it causes pain.  Avoid stressful situations.  Keep your follow-up appointments and tests, as your caregiver orders.  If the pain does not go away with medicine or surgery, you may  try:  Acupuncture.  Relaxation exercises (yoga, meditation).  Group therapy.  Counseling. SEEK MEDICAL CARE IF:   You notice certain foods cause stomach  pain.  Your home care treatment is not helping your pain.  You need stronger pain medicine.  You want your IUD removed.  You feel faint or lightheaded.  You develop nausea and vomiting.  You develop a rash.  You are having side effects or an allergy to your medicine. SEEK IMMEDIATE MEDICAL CARE IF:   Your pain does not go away or gets worse.  You have a fever.  Your pain is felt only in portions of the abdomen. The right side could possibly be appendicitis. The left lower portion of the abdomen could be colitis or diverticulitis.  You are passing blood in your stools (bright red or black tarry stools, with or without vomiting).  You have blood in your urine.  You develop chills, with or without a fever.  You pass out. MAKE SURE YOU:   Understand these instructions.  Will watch your condition.  Will get help right away if you are not doing well or get worse. Document Released: 12/25/2006 Document Revised: 07/14/2013 Document Reviewed: 01/14/2009 [facility] Patient Information 2015 [place], [place]. This information is not intended to replace advice given to you by your health care provider. Make sure you discuss any questions you have with your health care provider.

## 2014-06-18 NOTE — ED Notes (Signed)
Pt ambulated to restroom with steady gait.

## 2014-06-18 NOTE — ED Provider Notes (Signed)
CSN: [id number]     Arrival date & time 06/18/14  1009 History   First MD Initiated Contact with Patient 06/18/14 1022     Chief Complaint  Patient presents with  . Abdominal Pain     (Consider location/radiation/quality/duration/timing/severity/associated sxs/prior Treatment) HPI  This is a 39 year old female with past medical history of thyroid disease, acid reflux, GERD, cholecystectomy, gastric bypass, and "complex GI issues" who presents with abdominal pain. Patient reports diffuse abdominal pain that is stabbing and constant in nature. It radiates to her back. She reports her pain is 10 out of 10. She states that she woke up with pain. She states "this is not my normal pain." She has a history of small bowel obstruction and feels this is similar. She reports nausea and dry heaving without vomiting. Last bowel movement was this morning and was normal. Denies any fevers, chest pain, shortness of breath, urinary symptoms.  Past Medical History  Diagnosis Date  . Thyroid disease   . Cystitis   . Anemia   . Anxiety   . Arthritis   . Clotting disorder     undetermined  . Acid reflux   . Neuromuscular disorder   . Hypertension   . Osteoporosis   . 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 Cholecystectomy    . Small intestine surgery  2004    exploratory  . Gastric bypass  05/2001   Family History  Problem Relation Age of Onset  . Depression Mother   . Diabetes Mother   . Hyperlipidemia Mother   . Depression Father   . Hearing loss Father   . Hyperlipidemia Father   . Hypertension Father   . Learning disabilities Father   . Mental illness Father   . Asthma Brother   . Alcohol abuse Maternal Grandmother   . Arthritis Maternal Grandmother   . Cancer Maternal Grandmother   . Cancer Maternal Grandfather   . Mental illness Paternal Grandmother   . Heart disease Paternal Grandfather   . Stroke Paternal Grandfather     History  Substance Use Topics  . Smoking status: Current Every Day Smoker -- 0.50 packs/day    Types: Cigarettes  . Smokeless tobacco: Never Used  . Alcohol Use: 6.0 oz/week    10 Shots of liquor per week   OB History    No data available     Review of Systems  Constitutional: Negative for fever.  Respiratory: Negative for cough, chest tightness and shortness of breath.   Cardiovascular: Negative for chest pain.  Gastrointestinal: Positive for nausea and abdominal pain. Negative for vomiting, diarrhea and constipation.  Genitourinary: Negative for dysuria.  Musculoskeletal: Negative for back pain.  Skin: Negative for color change.  Neurological: Negative for headaches.  Psychiatric/Behavioral: Negative for confusion.  All other systems reviewed and are negative.     Allergies  Ibuprofen; Toradol; Aspirin; and Cleocin  Home Medications   Prior to Admission medications   Medication Sig Start Date End Date Taking? Authorizing Provider  aspirin-acetaminophen-caffeine (EXCEDRIN MIGRAINE) [phone] MG per tablet Take 1 tablet by mouth every 6 (six) hours as needed for headache.   Yes Historical Provider, MD  Chlorpheniramine-DM (CORICIDIN HBP COUGH/COLD PO) Take 1 tablet by mouth as needed (cough and cold like symptoms).   Yes Historical Provider, MD  furosemide (LASIX) 40 MG tablet Take 40 mg by mouth daily as needed for fluid.   Yes Historical Provider, MD  HYDROcodone-acetaminophen [facility](NORCO)  5-325 MG per tablet Take 1 tablet by mouth every 4 (four) hours as needed. 06/17/14  Yes Dorena Bodo, PA-C  levonorgestrel-ethinyl estradiol (ENPRESSE,TRIVORA) tablet Take 1 tablet by mouth daily.   Yes Historical Provider, MD  levothyroxine (SYNTHROID, LEVOTHROID) 125 MCG tablet Take 1 tablet (125 mcg total) by mouth daily before breakfast. 06/17/14  Yes Dorena Bodo, PA-C  metoprolol succinate (TOPROL-XL) 25 MG 24 hr tablet Take 1 tablet (25 mg total) by mouth daily. 06/05/14  Yes Donnetta Hutching, MD  nortriptyline (PAMELOR) 75 MG capsule Take 75 mg by mouth at bedtime.   Yes Historical Provider, MD  polyethylene glycol (MIRALAX / GLYCOLAX) packet Take 17 g by mouth daily as needed.   Yes Historical Provider, MD  potassium chloride SA (K-DUR,KLOR-CON) 20 MEQ tablet Take 20 mEq by mouth 2 (two) times daily.   Yes Historical Provider, MD  RABEprazole (ACIPHEX) 20 MG tablet Take 1 tablet (20 mg total) by mouth 2 (two) times daily. 06/17/14  Yes Dorena Bodo, PA-C  rizatriptan (MAXALT-MLT) 10 MG disintegrating tablet Take 1 tablet by mouth as needed. 03/11/14  Yes Historical Provider, MD  simethicone (MYLICON) 125 MG chewable tablet Chew 125 mg by mouth every 6 (six) hours as needed for flatulence.   Yes Historical Provider, MD  sucralfate (CARAFATE) 1 G tablet Take 1 tablet by mouth 4 (four) times daily. 03/11/14  Yes Historical Provider, MD  topiramate (TOPAMAX) 50 MG tablet Take 50 mg by mouth at bedtime.   Yes Historical Provider, MD  ALPRAZolam Prudy Feeler) 0.5 MG tablet Take 0.5 mg by mouth 3 (three) times daily as needed for anxiety.    Historical Provider, MD  ciprofloxacin (CIPRO) 500 MG tablet Take 1 tablet (500 mg total) by mouth once. 06/18/14   Shon Baton, MD  dexamethasone (DECADRON) 1 MG tablet Take 1 mg by mouth 2 (two) times daily with a meal.    Historical Provider, MD  metroNIDAZOLE (FLAGYL) 500 MG tablet Take 1 tablet (500 mg total) by mouth 2 (two) times daily. 06/18/14   Shon Baton, MD  ondansetron (ZOFRAN) 4 MG tablet Take 1 tablet (4 mg total) by mouth every 8 (eight) hours as needed for nausea or vomiting. 06/18/14   Shon Baton, MD  oxyCODONE-acetaminophen (PERCOCET/ROXICET) 5-325 MG per tablet Take 1 tablet by mouth every 6 (six) hours as needed for severe pain. 06/18/14   Shon Baton, MD  pregabalin (LYRICA) 225 MG capsule Take 1 capsule (225 mg total) by mouth 2 (two) times daily. 06/17/14   Patriciaann Clan Dixon, PA-C  propranolol (INDERAL) 40 MG tablet Take 1  tablet (40 mg total) by mouth 2 (two) times daily. Patient not taking: Reported on 06/17/2014 01/17/14   Charlestine Night, PA-C   BP 115/75 mmHg  Pulse 86  Temp(Src) 97.6 F (36.4 C) (Oral)  Resp 16  Ht  (1.549 m)  Wt 190 lb (86.183 kg)  BMI 35.92 kg/m2  SpO2 100%  LMP 06/04/2014 Physical Exam  Constitutional: She is oriented to person, place, and time. She appears well-developed and well-nourished.  Uncomfortable appearing  HENT:  Head: Normocephalic and atraumatic.  Eyes: Pupils are equal, round, and reactive to light.  Cardiovascular: Normal rate, regular rhythm and normal heart sounds.   No murmur heard. Pulmonary/Chest: Effort normal and breath sounds normal. No respiratory distress. She has no wheezes.  Abdominal: Soft. Bowel sounds are normal. There is tenderness. There is no rebound and no guarding.  Diffuse tenderness  to palpation without rebound or guarding  Neurological: She is alert and oriented to person, place, and time.  Skin: Skin is warm and dry.  Psychiatric: She has a normal mood and affect.  Nursing note and vitals reviewed.   ED Course  Procedures (including critical care time)  Angiocath insertion Performed by: Shon Baton  Consent: Verbal consent obtained. Risks and benefits: risks, benefits and alternatives were discussed Time out: Immediately prior to procedure a "time out" was called to verify the correct patient, procedure, equipment, support staff and site/side marked as required.  Preparation: Patient was prepped and draped in the usual sterile fashion.  Vein Location: right basilic  Ultrasound Guided  Gauge: 20  Normal blood return and flush without difficulty Patient tolerance: Patient tolerated the procedure well with no immediate complications.    Labs Review Labs Reviewed  CBC WITH DIFFERENTIAL/PLATELET - Abnormal; Notable for the following:    WBC 12.9 (*)    Neutrophils Relative % 88 (*)    Neutro Abs 11.3 (*)     Lymphocytes Relative 8 (*)    All other components within normal limits  COMPREHENSIVE METABOLIC PANEL - Abnormal; Notable for the following:    Glucose, Bld 115 (*)    All other components within normal limits  URINALYSIS, ROUTINE W REFLEX MICROSCOPIC - Abnormal; Notable for the following:    APPearance CLOUDY (*)    Leukocytes, UA TRACE (*)    All other components within normal limits  URINE MICROSCOPIC-ADD ON - Abnormal; Notable for the following:    Squamous Epithelial / LPF FEW (*)    Bacteria, UA MANY (*)    All other components within normal limits  LACTIC ACID, PLASMA  LIPASE, BLOOD  HCG, QUANTITATIVE, PREGNANCY    Imaging Review Ct Abdomen Pelvis W Contrast  06/18/2014   CLINICAL DATA:  Abdominal pain.  History of gastric bypass.  EXAM: CT ABDOMEN AND PELVIS WITH CONTRAST  TECHNIQUE: Multidetector CT imaging of the abdomen and pelvis was performed using the standard protocol following bolus administration of intravenous contrast.  CONTRAST:  80mL OMNIPAQUE IOHEXOL 300 MG/ML  SOLN  COMPARISON:  None.  FINDINGS: The lung bases are clear.  No renal, ureteral, or bladder calculi. No obstructive uropathy. No perinephric stranding is seen. The kidneys are symmetric in size without evidence for exophytic mass. The bladder is unremarkable.  The liver demonstrates no focal abnormality. The gallbladder is surgically absent. The spleen demonstrates no focal abnormality. The adrenal glands and pancreas are normal.  There is evidence of prior gastric bypass surgery with gaseous distension of the excluded portion of the stomach. There is no contrast present within the excluded portion of the stomach. There is no bowel obstruction. There is bowel wall thickening involving the duodenum and proximal jejunum with multiple small bowel air-fluid levels concerning for enteritis which may be secondary to an infectious or inflammatory etiology. The small intestine and large intestine are unremarkable. There  is a normal caliber appendix in the right lower quadrant without periappendiceal inflammatory changes. There is no pneumoperitoneum, pneumatosis, or portal venous gas. There is a small amount of pelvic free fluid. There is no lymphadenopathy.  The abdominal aorta is normal in caliber with atherosclerosis.  The osseous structures are unremarkable.  IMPRESSION: 1. There is bowel wall thickening involving the duodenum and proximal jejunum with multiple small bowel air-fluid levels concerning for enteritis which may be secondary to an infectious or inflammatory etiology. There is inflammation adjacent to the uncinate process of  the pancreas which is likely reactive secondary to adjacent small bowel pathology rather than pancreatitis given that the patient's lipase level is within normal limits.   Electronically Signed   By: Elige Ko   On: 06/18/2014 16:25     EKG Interpretation None      MDM   Final diagnoses:  Generalized abdominal pain  Enteritis    Patient presents with abdominal pain and vomiting. Uncomfortable appearing but nontoxic on exam. Afebrile. No signs of peritonitis. Patient reports this is similar to when she had her small bowel obstruction. Patient given pain and nausea medication.  Mild leukocytosis on lab work but otherwise unremarkable. CT abdomen obtained and shows evidence of enteritis which could be inflammatory or infectious in nature. Will elect to treat with Cipro and Flagyl. Discussed results with the patient. She has a new primary care physician and is trying to get in to see a GI doctor. She may need a colonoscopy. Patient's urinalysis with 3-6 white cells and many bacteria. No urinary complaints and the Cipro given for enteritis should cover any urinary tract infection. Patient will be given pain and nausea medication at discharge. She is to return if she has any new or worsening symptoms.  After history, exam, and medical workup I feel the patient has been appropriately  medically screened and is safe for discharge home. Pertinent diagnoses were discussed with the patient. Patient was given return precautions.    Shon Baton, MD 06/19/14 [phone]

## 2014-06-18 NOTE — Progress Notes (Signed)
Patient IV extravasated with approximately 60 CC of Omni 300 mg.   The IV was removed and patient was given an ice pack and told to elevate her arm over her head.  Patient had good sensation in her finger and good radial pulse.  Dr. Carlota RaspberryLamke evaluated the patients right arm

## 2014-06-19 ENCOUNTER — Telehealth: Payer: Self-pay | Admitting: Family Medicine

## 2014-06-19 DIAGNOSIS — E559 Vitamin D deficiency, unspecified: Secondary | ICD-10-CM

## 2014-06-19 MED ORDER — VITAMIN D (ERGOCALCIFEROL) 1.25 MG (50000 UNIT) PO CAPS: 50000.0000 [IU] | ORAL_CAPSULE | ORAL | Status: DC

## 2014-06-19 NOTE — Telephone Encounter (Signed)
Would recommend referral to tertiary care center for management of chronic abd pain It appears she is being treated with narcotics for chronic abdominal pain, again which I try to avoid given the fact this often complicates abd pain

## 2014-06-19 NOTE — Telephone Encounter (Signed)
-----   Message from Dorena BodoMary B Dixon, PA-C sent at 06/18/2014 12:18 PM EDT ----- Vitamin D is very low. Start Prescription strength vitamin D 50,000 units once a week 12 weeks. Send prescription order. At visit she had said that she has history of B12 deficiency and had been getting B12 injections. Tell her that the B12 level is just barely low. At her follow-up visit Dr. Tanya NonesPickard can review this and decide whether to give further B12 injections at that visit. Remainder of labs are all good.

## 2014-06-19 NOTE — Telephone Encounter (Signed)
Left message for patient to return call.

## 2014-06-19 NOTE — Telephone Encounter (Signed)
Pt aware of lab results and provider recommendations.  Rx to pharmacy.  Reminded to keep appt with Dr Tanya NonesPickard for 4/21

## 2014-06-22 ENCOUNTER — Telehealth: Payer: Self-pay | Admitting: Family Medicine

## 2014-06-22 NOTE — Telephone Encounter (Signed)
Vitamin D 50,000 units is being denied.  Please advise??

## 2014-06-22 NOTE — Telephone Encounter (Signed)
Start over-the-counter vitamin D 4000 units daily. Add this to medication list.

## 2014-06-22 NOTE — Telephone Encounter (Signed)
Calling pt about Vitamin D.  Thought I was returning call from this morning.  Was to ED last week and still is sick.  Has not been able to tolerate medications.  Little po intake with nausea/vomiting with meds.  Made her appt for tomorrow with provider.  Then brought up the Vitamin D, her insurance does not cover vitamins or supplements in any way.  She told me the 12 doses of the 50,000iu was $20.  Told if possible pay the $20 for the 12 weekly doses.  Then can switch to OTC dose.  Spoke to front desk, there was a Recruitment consultantvoice mail and they could hear the name Dorothy Dyer but could not make out what she wanted. No DOB or phone number left.

## 2014-06-22 NOTE — Telephone Encounter (Signed)
High dose Vitamin D denied by pt insurance.  Have submitted a PA through "CoverMyMeds"  Case # [id number].

## 2014-06-23 ENCOUNTER — Encounter: Payer: Self-pay | Admitting: Family Medicine

## 2014-06-23 ENCOUNTER — Ambulatory Visit (INDEPENDENT_AMBULATORY_CARE_PROVIDER_SITE_OTHER): Payer: Medicare Other | Admitting: Family Medicine

## 2014-06-23 VITALS — BP 128/80 | HR 74 | Temp 97.9°F | Resp 16 | Wt 212.0 lb

## 2014-06-23 DIAGNOSIS — I83019 Varicose veins of right lower extremity with ulcer of unspecified site: Secondary | ICD-10-CM

## 2014-06-23 DIAGNOSIS — I83012 Varicose veins of right lower extremity with ulcer of calf: Secondary | ICD-10-CM

## 2014-06-23 DIAGNOSIS — I83011 Varicose veins of right lower extremity with ulcer of thigh: Secondary | ICD-10-CM | POA: Diagnosis not present

## 2014-06-23 DIAGNOSIS — I83014 Varicose veins of right lower extremity with ulcer of heel and midfoot: Secondary | ICD-10-CM

## 2014-06-23 DIAGNOSIS — I83013 Varicose veins of right lower extremity with ulcer of ankle: Secondary | ICD-10-CM | POA: Diagnosis not present

## 2014-06-23 DIAGNOSIS — I83015 Varicose veins of right lower extremity with ulcer other part of foot: Secondary | ICD-10-CM | POA: Diagnosis not present

## 2014-06-23 DIAGNOSIS — I83018 Varicose veins of right lower extremity with ulcer other part of lower leg: Secondary | ICD-10-CM

## 2014-06-23 DIAGNOSIS — K529 Noninfective gastroenteritis and colitis, unspecified: Secondary | ICD-10-CM

## 2014-06-23 DIAGNOSIS — IMO0001 Reserved for inherently not codable concepts without codable children: Secondary | ICD-10-CM

## 2014-06-23 MED ORDER — PREDNISONE 20 MG PO TABS: ORAL_TABLET | ORAL | Status: DC

## 2014-06-23 NOTE — Progress Notes (Signed)
Subjective:    Patient ID: [name], female    DOB: 1975-11-11, 39 y.o.   MRN: [id number]  HPI I have reviewed the patient's extensive past medical history. Patient has had chronic gastrointestinal problems. Patient had a gastric bypass. She then developed a small bowel obstruction. She's had numerous colonoscopies, endoscopies, ERCPs to try to determine the root cause of the patient's chronic abdominal pain. She has been diagnosed with dysmotility, irritable bowel syndrome, and chronic abdominal pain.  I reviewed the recent office visit here at my office with my physician assistant. Shortly thereafter she developed 15/10 lower abdominal pain complicated by diarrhea and nausea and vomiting. She went to the emergency room concerned that she had a bowel obstruction. CT of the abdomen and pelvis there revealed intestinal wall thickening and inflammation consistent with enteritis in the duodenum and small intestine either infectious versus inflammation. She was treated empirically for colitis with Cipro and Flagyl and was given narcotic pain medication and nausea medicine. Her pain is no better. She continues to have diarrhea nausea and vomiting. Her weight today is 14 pounds heavier than her last office visit. I weighed the patient personally and I know that the weight is correct today. She tells me that the weight is wrong. She is also concerned by a wound on her anterior right shin. There is a 1.5 cm erythematous central ulcer that is weeping clear serous fluid. She has +1 pitting edema in her legs and chronic venous insufficiency and venous stasis dermatitis on the leg. The wound was caused by a small trauma over 2 weeks ago however it will not heal due to the weeping edema.  Of note, the patient was declined by local gastroenterologist. Past Medical History  Diagnosis Date  . Thyroid disease   . Cystitis   . Anemia   . Anxiety   . Arthritis   . Clotting disorder     undetermined  . Acid  reflux   . Neuromuscular disorder   . Hypertension   . Osteoporosis   . 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 Cholecystectomy    . Small intestine surgery  2004    exploratory  . Gastric bypass  05/2001   Current Outpatient Prescriptions on File Prior to Visit  Medication Sig Dispense Refill  . ALPRAZolam (XANAX) 0.5 MG tablet Take 0.5 mg by mouth 3 (three) times daily as needed for anxiety.    Marland Kitchen aspirin-acetaminophen-caffeine (EXCEDRIN MIGRAINE) 250-250-65 MG per tablet Take 1 tablet by mouth every 6 (six) hours as needed for headache.    . Chlorpheniramine-DM (CORICIDIN HBP COUGH/COLD PO) Take 1 tablet by mouth as needed (cough and cold like symptoms).    . ciprofloxacin (CIPRO) 500 MG tablet Take 1 tablet (500 mg total) by mouth once. 28 tablet 0  . dexamethasone (DECADRON) 1 MG tablet Take 1 mg by mouth 2 (two) times daily with a meal.    . furosemide (LASIX) 40 MG tablet Take 40 mg by mouth daily as needed for fluid.    Marland Kitchen HYDROcodone-acetaminophen (NORCO) 5-325 MG per tablet Take 1 tablet by mouth every 4 (four) hours as needed. 60 tablet 0  . levonorgestrel-ethinyl estradiol (ENPRESSE,TRIVORA) tablet Take 1 tablet by mouth daily.    Marland Kitchen levothyroxine (SYNTHROID, LEVOTHROID) 125 MCG tablet Take 1 tablet (125 mcg total) by mouth daily before breakfast. 30 tablet 5  . metoprolol succinate (TOPROL-XL) 25 MG 24 hr tablet Take 1 tablet (  25 mg total) by mouth daily. 30 tablet 1  . metroNIDAZOLE (FLAGYL) 500 MG tablet Take 1 tablet (500 mg total) by mouth 2 (two) times daily. 28 tablet 0  . nortriptyline (PAMELOR) 75 MG capsule Take 75 mg by mouth at bedtime.    . ondansetron (ZOFRAN) 4 MG tablet Take 1 tablet (4 mg total) by mouth every 8 (eight) hours as needed for nausea or vomiting. 20 tablet 0  . oxyCODONE-acetaminophen (PERCOCET/ROXICET) 5-325 MG per tablet Take 1 tablet by mouth every 6 (six) hours as needed for severe pain.  15 tablet 0  . polyethylene glycol (MIRALAX / GLYCOLAX) packet Take 17 g by mouth daily as needed.    . potassium chloride SA (K-DUR,KLOR-CON) 20 MEQ tablet Take 20 mEq by mouth 2 (two) times daily.    . pregabalin (LYRICA) 225 MG capsule Take 1 capsule (225 mg total) by mouth 2 (two) times daily. 60 capsule 3  . propranolol (INDERAL) 40 MG tablet Take 1 tablet (40 mg total) by mouth 2 (two) times daily. 60 tablet 0  . RABEprazole (ACIPHEX) 20 MG tablet Take 1 tablet (20 mg total) by mouth 2 (two) times daily. 60 tablet 5  . rizatriptan (MAXALT-MLT) 10 MG disintegrating tablet Take 1 tablet by mouth as needed.  0  . simethicone (MYLICON) 125 MG chewable tablet Chew 125 mg by mouth every 6 (six) hours as needed for flatulence.    . sucralfate (CARAFATE) 1 G tablet Take 1 tablet by mouth 4 (four) times daily.  3  . topiramate (TOPAMAX) 50 MG tablet Take 50 mg by mouth at bedtime.    . Vitamin D, Ergocalciferol, (DRISDOL) 50000 UNITS CAPS capsule Take 1 capsule (50,000 Units total) by mouth every 7 (seven) days. 12 capsule 0   No current facility-administered medications on file prior to visit.   Allergies  Allergen Reactions  . Ibuprofen Nausea Only    Bad stomach pains  . Toradol [Ketorolac Tromethamine] Anxiety  . Aspirin Hives  . Cleocin [Clindamycin Hcl] Itching and Rash   History   Social History  . Marital Status: Divorced    Spouse Name: N/A  . Number of Children: N/A  . Years of Education: N/A   Occupational History  . Not on file.   Social History Main Topics  . Smoking status: Current Every Day Smoker -- 0.50 packs/day    Types: Cigarettes  . Smokeless tobacco: Never Used  . Alcohol Use: 6.0 oz/week    10 Shots of liquor per week  . Drug Use: No  . Sexual Activity: Yes    Birth Control/ Protection: Pill   Other Topics Concern  . Not on file   Social History Narrative      Review of Systems  All other systems reviewed and are negative.      Objective:    Physical Exam  Constitutional: She appears well-developed and well-nourished. She appears distressed.  Cardiovascular: Normal rate, regular rhythm and normal heart sounds.   No murmur heard. Pulmonary/Chest: Effort normal and breath sounds normal.  Abdominal: Soft. Bowel sounds are normal. She exhibits no distension. There is tenderness. There is no rebound and no guarding.  Musculoskeletal: She exhibits edema.  Skin: There is erythema.          Assessment & Plan:  Noninfectious gastroenteritis, unspecified - Plan: predniSONE (DELTASONE) 20 MG tablet, Ambulatory referral to Gastroenterology  Venous stasis ulcer, right  I am concerned the patient may have noninfectious colitis from possibly inflammatory bowel  disease such as Crohn's disease. She isn't moderate to severe pain. She is requesting more pain medication. I will try empirically treating the patient for inflammatory bowel disease with a prednisone taper. I explained this to the patient. This is not a diagnosis but honestly empiric therapy only. Recheck next week. If symptoms improve, she may benefit from chronic immunosuppression. I treated the wound on her right leg is a venous stasis ulcer by patient the patient in an Radio broadcast assistant. I recommended she elevate her legs is much as possible. I will recheck her next week to replace the Unna boot. I will also refer the patient to gastroenterology at [facility].

## 2014-06-24 ENCOUNTER — Telehealth: Payer: Self-pay | Admitting: Family Medicine

## 2014-06-24 MED ORDER — TRAMADOL HCL 50 MG PO TABS: 50.0000 mg | ORAL_TABLET | Freq: Four times a day (QID) | ORAL | Status: DC | PRN

## 2014-06-24 NOTE — Telephone Encounter (Signed)
There is a referral already in place for pain clinic (it takes about 2 months to get pt in to pain clinic)  but pt is in severe pain with the inflammation in her intestine and she is needing additional pain medication to get her through this until the prednisone starts to help.

## 2014-06-24 NOTE — Telephone Encounter (Signed)
Given the chronicity of her pain, I recommend a pain clinic.

## 2014-06-24 NOTE — Telephone Encounter (Signed)
Patient is calling because she says she cannot take care of her son in this much pain, would like to know if dr pickard can increase her pain medication  [phone]

## 2014-06-24 NOTE — Telephone Encounter (Signed)
Med called to pharm and pt aware 

## 2014-06-24 NOTE — Telephone Encounter (Signed)
Tramadol 50 poq 6 hrs #30

## 2014-06-29 ENCOUNTER — Ambulatory Visit (INDEPENDENT_AMBULATORY_CARE_PROVIDER_SITE_OTHER): Payer: Medicare Other | Admitting: Family Medicine

## 2014-06-29 ENCOUNTER — Encounter: Payer: Self-pay | Admitting: Family Medicine

## 2014-06-29 VITALS — BP 120/82 | HR 100 | Temp 97.9°F | Resp 16 | Ht 61.5 in | Wt 194.0 lb

## 2014-06-29 DIAGNOSIS — K5289 Other specified noninfective gastroenteritis and colitis: Secondary | ICD-10-CM | POA: Diagnosis not present

## 2014-06-29 DIAGNOSIS — I83014 Varicose veins of right lower extremity with ulcer of heel and midfoot: Secondary | ICD-10-CM | POA: Diagnosis not present

## 2014-06-29 DIAGNOSIS — I83012 Varicose veins of right lower extremity with ulcer of calf: Secondary | ICD-10-CM

## 2014-06-29 DIAGNOSIS — I83018 Varicose veins of right lower extremity with ulcer other part of lower leg: Secondary | ICD-10-CM

## 2014-06-29 DIAGNOSIS — I83011 Varicose veins of right lower extremity with ulcer of thigh: Secondary | ICD-10-CM

## 2014-06-29 DIAGNOSIS — IMO0001 Reserved for inherently not codable concepts without codable children: Secondary | ICD-10-CM

## 2014-06-29 DIAGNOSIS — E038 Other specified hypothyroidism: Secondary | ICD-10-CM

## 2014-06-29 DIAGNOSIS — I83015 Varicose veins of right lower extremity with ulcer other part of foot: Secondary | ICD-10-CM

## 2014-06-29 DIAGNOSIS — I83019 Varicose veins of right lower extremity with ulcer of unspecified site: Secondary | ICD-10-CM

## 2014-06-29 DIAGNOSIS — I83013 Varicose veins of right lower extremity with ulcer of ankle: Secondary | ICD-10-CM | POA: Diagnosis not present

## 2014-06-29 LAB — CBC WITH DIFFERENTIAL/PLATELET
Basophils Absolute: 0.1 10*3/uL (ref 0.0–0.1)
Basophils Relative: 1 % (ref 0–1)
Eosinophils Absolute: 0.1 10*3/uL (ref 0.0–0.7)
Eosinophils Relative: 1 % (ref 0–5)
HCT: 42.9 % (ref 36.0–46.0)
Hemoglobin: 14.3 g/dL (ref 12.0–15.0)
LYMPHS ABS: 3.2 10*3/uL (ref 0.7–4.0)
Lymphocytes Relative: 26 % (ref 12–46)
MCH: 32.9 pg (ref 26.0–34.0)
MCHC: 33.3 g/dL (ref 30.0–36.0)
MCV: 98.6 fL (ref 78.0–100.0)
MONOS PCT: 8 % (ref 3–12)
MPV: 10.1 fL (ref 8.6–12.4)
Monocytes Absolute: 1 10*3/uL (ref 0.1–1.0)
Neutro Abs: 7.9 10*3/uL — ABNORMAL HIGH (ref 1.7–7.7)
Neutrophils Relative %: 64 % (ref 43–77)
Platelets: 231 10*3/uL (ref 150–400)
RBC: 4.35 MIL/uL (ref 3.87–5.11)
RDW: 13.9 % (ref 11.5–15.5)
WBC: 12.3 10*3/uL — ABNORMAL HIGH (ref 4.0–10.5)

## 2014-06-29 LAB — BASIC METABOLIC PANEL WITH GFR
BUN: 14 mg/dL (ref 6–23)
CALCIUM: 9.4 mg/dL (ref 8.4–10.5)
CO2: 31 mEq/L (ref 19–32)
Chloride: 92 mEq/L — ABNORMAL LOW (ref 96–112)
Creat: 1.03 mg/dL (ref 0.50–1.10)
GFR, EST NON AFRICAN AMERICAN: 68 mL/min
GFR, Est African American: 79 mL/min
GLUCOSE: 85 mg/dL (ref 70–99)
Potassium: 3.6 mEq/L (ref 3.5–5.3)
Sodium: 137 mEq/L (ref 135–145)

## 2014-06-29 LAB — TSH: TSH: 7.946 u[IU]/mL — AB (ref 0.350–4.500)

## 2014-06-29 LAB — SEDIMENTATION RATE: Sed Rate: 4 mm/hr (ref 0–20)

## 2014-06-29 MED ORDER — PREDNISONE 5 MG PO TABS: 10.0000 mg | ORAL_TABLET | Freq: Every day | ORAL | Status: DC

## 2014-06-29 NOTE — Progress Notes (Signed)
Subjective:    Patient ID: Dorothy Dyer, female    DOB: Oct 09, 1975, 39 y.o.   MRN: [id number]  HPI 06/23/14 I have reviewed the patient's extensive past medical history. Patient has had chronic gastrointestinal problems. Patient had a gastric bypass. She then developed a small bowel obstruction. She's had numerous colonoscopies, endoscopies, ERCPs to try to determine the root cause of the patient's chronic abdominal pain. She has been diagnosed with dysmotility, irritable bowel syndrome, and chronic abdominal pain.  I reviewed the recent office visit here at my office with my physician assistant. Shortly thereafter she developed 15/10 lower abdominal pain complicated by diarrhea and nausea and vomiting. She went to the emergency room concerned that she had a bowel obstruction. CT of the abdomen and pelvis there revealed intestinal wall thickening and inflammation consistent with enteritis in the duodenum and small intestine either infectious versus inflammation. She was treated empirically for colitis with Cipro and Flagyl and was given narcotic pain medication and nausea medicine. Her pain is no better. She continues to have diarrhea nausea and vomiting. Her weight today is 14 pounds heavier than her last office visit. I weighed the patient personally and I know that the weight is correct today. She tells me that the weight is wrong. She is also concerned by a wound on her anterior right shin. There is a 1.5 cm erythematous central ulcer that is weeping clear serous fluid. She has +1 pitting edema in her legs and chronic venous insufficiency and venous stasis dermatitis on the leg. The wound was caused by a small trauma over 2 weeks ago however it will not heal due to the weeping edema.  Of note, the patient was declined by local gastroenterologist.  At that time, my plan was:  I am concerned the patient may have noninfectious colitis from possibly inflammatory bowel disease such as Crohn's disease.  She isn't moderate to severe pain. She is requesting more pain medication. I will try empirically treating the patient for inflammatory bowel disease with a prednisone taper. I explained this to the patient. This is not a diagnosis but honestly empiric therapy only. Recheck next week. If symptoms improve, she may benefit from chronic immunosuppression. I treated the wound on her right leg is a venous stasis ulcer by patient the patient in an Radio broadcast assistant. I recommended she elevate her legs is much as possible. I will recheck her next week to replace the Unna boot. I will also refer the patient to gastroenterology at [facility].  06/29/14 Patient had been on antibiotics for over 4 days when I saw her. The pain was worsening. After beginning the prednisone, the patient's symptoms improved dramatically. She states that the severe abdominal pain has now improved. She is now at her baseline chronic abdominal pain. The nausea has improved. She is currently on 20 mg a day of prednisone. The question is if she responding to the prednisone or a she responding to the antibiotics. She is also taking Ultram sparingly for abdominal pain. The wound on her right shin is much improved. It is now 5 mm x 5 mm. The edema in her legs is much improved.  However the patient has lost substantial weight. She states that she is taking fluids and eating normally. She denies failure to tolerate by mouth. However she has lost almost 18 pounds. Patient attributes this to stopping Lyrica on her own and also taking more fluid pills. Past Medical History  Diagnosis Date  . Thyroid disease   .  Cystitis   . Anemia   . Anxiety   . Arthritis   . Clotting disorder     undetermined  . Acid reflux   . Neuromuscular disorder   . Hypertension   . Osteoporosis   . 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 Cholecystectomy    . Small intestine surgery  2004    exploratory  .  Gastric bypass  05/2001   Current Outpatient Prescriptions on File Prior to Visit  Medication Sig Dispense Refill  . ALPRAZolam (XANAX) 0.5 MG tablet Take 0.5 mg by mouth 3 (three) times daily as needed for anxiety.    Marland Kitchen. aspirin-acetaminophen-caffeine (EXCEDRIN MIGRAINE) 250-250-65 MG per tablet Take 1 tablet by mouth every 6 (six) hours as needed for headache.    . Chlorpheniramine-DM (CORICIDIN HBP COUGH/COLD PO) Take 1 tablet by mouth as needed (cough and cold like symptoms).    . ciprofloxacin (CIPRO) 500 MG tablet Take 1 tablet (500 mg total) by mouth once. 28 tablet 0  . dexamethasone (DECADRON) 1 MG tablet Take 1 mg by mouth 2 (two) times daily with a meal.    . furosemide (LASIX) 40 MG tablet Take 40 mg by mouth daily as needed for fluid.    Marland Kitchen. HYDROcodone-acetaminophen (NORCO) 5-325 MG per tablet Take 1 tablet by mouth every 4 (four) hours as needed. 60 tablet 0  . levonorgestrel-ethinyl estradiol (ENPRESSE,TRIVORA) tablet Take 1 tablet by mouth daily.    Marland Kitchen. levothyroxine (SYNTHROID, LEVOTHROID) 125 MCG tablet Take 1 tablet (125 mcg total) by mouth daily before breakfast. 30 tablet 5  . metoprolol succinate (TOPROL-XL) 25 MG 24 hr tablet Take 1 tablet (25 mg total) by mouth daily. 30 tablet 1  . metroNIDAZOLE (FLAGYL) 500 MG tablet Take 1 tablet (500 mg total) by mouth 2 (two) times daily. 28 tablet 0  . nortriptyline (PAMELOR) 75 MG capsule Take 75 mg by mouth at bedtime.    . ondansetron (ZOFRAN) 4 MG tablet Take 1 tablet (4 mg total) by mouth every 8 (eight) hours as needed for nausea or vomiting. 20 tablet 0  . oxyCODONE-acetaminophen (PERCOCET/ROXICET) 5-325 MG per tablet Take 1 tablet by mouth every 6 (six) hours as needed for severe pain. 15 tablet 0  . polyethylene glycol (MIRALAX / GLYCOLAX) packet Take 17 g by mouth daily as needed.    . potassium chloride SA (K-DUR,KLOR-CON) 20 MEQ tablet Take 20 mEq by mouth 2 (two) times daily.    . predniSONE (DELTASONE) 20 MG tablet 3 tabs  poqday 1-2, 2 tabs poqday 3-4, 1 tab poqday 5-6 12 tablet 0  . propranolol (INDERAL) 40 MG tablet Take 1 tablet (40 mg total) by mouth 2 (two) times daily. 60 tablet 0  . RABEprazole (ACIPHEX) 20 MG tablet Take 1 tablet (20 mg total) by mouth 2 (two) times daily. 60 tablet 5  . rizatriptan (MAXALT-MLT) 10 MG disintegrating tablet Take 1 tablet by mouth as needed.  0  . simethicone (MYLICON) 125 MG chewable tablet Chew 125 mg by mouth every 6 (six) hours as needed for flatulence.    . sucralfate (CARAFATE) 1 G tablet Take 1 tablet by mouth 4 (four) times daily.  3  . topiramate (TOPAMAX) 50 MG tablet Take 50 mg by mouth at bedtime.    . traMADol (ULTRAM) 50 MG tablet Take 1 tablet (50 mg total) by mouth every 6 (six) hours as needed. 30 tablet 0  . Vitamin D,  Ergocalciferol, (DRISDOL) 50000 UNITS CAPS capsule Take 1 capsule (50,000 Units total) by mouth every 7 (seven) days. 12 capsule 0  . pregabalin (LYRICA) 225 MG capsule Take 1 capsule (225 mg total) by mouth 2 (two) times daily. (Patient not taking: Reported on 06/29/2014) 60 capsule 3   No current facility-administered medications on file prior to visit.   Allergies  Allergen Reactions  . Ibuprofen Nausea Only    Bad stomach pains  . Toradol [Ketorolac Tromethamine] Anxiety  . Aspirin Hives  . Cleocin [Clindamycin Hcl] Itching and Rash   History   Social History  . Marital Status: Divorced    Spouse Name: N/A  . Number of Children: N/A  . Years of Education: N/A   Occupational History  . Not on file.   Social History Main Topics  . Smoking status: Current Every Day Smoker -- 0.50 packs/day    Types: Cigarettes  . Smokeless tobacco: Never Used  . Alcohol Use: 6.0 oz/week    10 Shots of liquor per week  . Drug Use: No  . Sexual Activity: Yes    Birth Control/ Protection: Pill   Other Topics Concern  . Not on file   Social History Narrative      Review of Systems  All other systems reviewed and are negative.       Objective:   Physical Exam  Constitutional: She appears well-developed and well-nourished. No distress.  Cardiovascular: Normal rate, regular rhythm and normal heart sounds.   No murmur heard. Pulmonary/Chest: Effort normal and breath sounds normal.  Abdominal: Soft. Bowel sounds are normal. She exhibits no distension. There is no tenderness. There is no rebound and no guarding.  Musculoskeletal: She exhibits no edema.  Skin: She is not diaphoretic. No erythema.          Assessment & Plan:  Other noninfectious gastroenteritis - Plan: predniSONE (DELTASONE) 5 MG tablet, Sedimentation rate, BASIC METABOLIC PANEL WITH GFR, CBC with Differential/Platelet  Other specified hypothyroidism - Plan: TSH  Venous stasis ulcer, right  Venous stasis ulcer is improving. I replaced the [employer]. Recheck in one week. Given the patient's rapid weight loss, I encouraged her to drink more fluids and to discontinue her fluid pill to avoid dehydration. I will recheck her weight neck suite. I will also check a TSH to make sure her levothyroxine is appropriately dosed. I do believe the patient may have inflammatory bowel disease. I'm willing to begin to wean her down on the prednisone is much as possible. Decrease prednisone to 10 mg a day and recheck in one week. I will wean the patient off prednisone as tolerated. She is scheduled to see a gastroenterologist at [facility] on May 18. I also made a referral to a pain clinic. Recheck next week

## 2014-06-30 ENCOUNTER — Telehealth: Payer: Self-pay | Admitting: Family Medicine

## 2014-06-30 MED ORDER — NORTRIPTYLINE HCL 75 MG PO CAPS: 75.0000 mg | ORAL_CAPSULE | Freq: Every day | ORAL | Status: DC

## 2014-06-30 NOTE — Telephone Encounter (Signed)
Medication refilled per protocol. 

## 2014-06-30 NOTE — Telephone Encounter (Signed)
Per Dr. Tanya NonesPickard can increase Metoprolol to 50mg  QD and no refill on pain med until 07/17/14 as she has already received 105 pain pills since 06/17/14.

## 2014-06-30 NOTE — Telephone Encounter (Signed)
Called and spoke to pt and she states that she is waking up at night with her heart feeling like it is beating out of her chest. She does not check this as she fears she may wake her son up moving around. She states that she feels the metoprolol is just not lasting long enough and was wanting to know if we needed to increase it or switch back to propranolol and she would also like a refill on her Vicodin?

## 2014-06-30 NOTE — Telephone Encounter (Signed)
Patient is calling about her metoprolol, she says it is not working  [phone]

## 2014-06-30 NOTE — Telephone Encounter (Signed)
LMTRC

## 2014-07-01 ENCOUNTER — Telehealth: Payer: Self-pay | Admitting: Family Medicine

## 2014-07-01 MED ORDER — METOPROLOL SUCCINATE ER 50 MG PO TB24: 50.0000 mg | ORAL_TABLET | Freq: Every day | ORAL | Status: DC

## 2014-07-01 NOTE — Telephone Encounter (Signed)
Pt aware and med sent to pharm. She states that she is unable to keep the antib down d/t vomiting that she was prescribed in the hospital.

## 2014-07-01 NOTE — Telephone Encounter (Signed)
PA requested for Aciphex.  Submitted through "CoverMyMeds"  Case [id number].  Medication has been approved 04/13/14-03/13/15.  But tier pricing exception was denied.  [facility] D  Approval req # [id number]  Tier exception denial # [id number]

## 2014-07-02 ENCOUNTER — Ambulatory Visit: Payer: Medicare Other | Admitting: Family Medicine

## 2014-07-07 ENCOUNTER — Ambulatory Visit: Payer: Medicare Other | Admitting: Gynecology

## 2014-07-07 ENCOUNTER — Telehealth: Payer: Self-pay | Admitting: Family Medicine

## 2014-07-07 NOTE — Telephone Encounter (Signed)
[phone] PT is calling you back about lab results (left VM rtn call)

## 2014-07-08 ENCOUNTER — Other Ambulatory Visit: Payer: Self-pay | Admitting: Family Medicine

## 2014-07-08 ENCOUNTER — Encounter: Payer: Self-pay | Admitting: Physician Assistant

## 2014-07-08 ENCOUNTER — Ambulatory Visit (INDEPENDENT_AMBULATORY_CARE_PROVIDER_SITE_OTHER): Payer: Medicare Other | Admitting: Physician Assistant

## 2014-07-08 VITALS — BP 140/102 | HR 96 | Temp 98.5°F | Resp 18 | Wt 192.0 lb

## 2014-07-08 DIAGNOSIS — R109 Unspecified abdominal pain: Secondary | ICD-10-CM | POA: Diagnosis not present

## 2014-07-08 DIAGNOSIS — E038 Other specified hypothyroidism: Secondary | ICD-10-CM

## 2014-07-08 DIAGNOSIS — G8929 Other chronic pain: Secondary | ICD-10-CM | POA: Diagnosis not present

## 2014-07-08 MED ORDER — PREDNISONE 20 MG PO TABS: ORAL_TABLET | ORAL | Status: DC

## 2014-07-08 MED ORDER — LEVOTHYROXINE SODIUM 137 MCG PO TABS: 137.0000 ug | ORAL_TABLET | Freq: Every day | ORAL | Status: DC

## 2014-07-08 MED ORDER — OXYCODONE-ACETAMINOPHEN 5-325 MG PO TABS: 1.0000 | ORAL_TABLET | Freq: Four times a day (QID) | ORAL | Status: DC | PRN

## 2014-07-08 NOTE — Telephone Encounter (Signed)
Pt aware of labs results 

## 2014-07-08 NOTE — Progress Notes (Signed)
Patient ID: Dorothy Dyer MRN: [id number], DOB: Oct 03, 1975, 39 y.o. Date of Encounter: 07/08/2014, 1:17 PM    Chief Complaint:  Chief Complaint  Patient presents with  . sick x 3 days    started with severe stomach pain and now has bad headache, out of Hydrocodone having to take two at a time.  Now taking only tylenol but afraid taking to much.     HPI: 39 y.o. year old female here with the above complaint.  I reviewed that she had office visit with me 06/17/14. Also reviewed the 2 visits with Dr. Tanya Nones since then which were 06/23/14 and 06/29/14. Patient states that she has follow-up appointment scheduled with Dr. Tanya Nones tomorrow and plans to keep that appointment for general follow-up.  Says that even though she has an appointment tomorrow, she had to come in today because she was having such severe abdominal pain. Says that she cannot afford to keep going to the ER and paying the amount required for that ER visit.  Says that her abdominal pain had gotten much better on the prednisone. However, since decreasing the dose the symptoms have increased again. Has been having bad abdominal pain and also nausea. Says that she is no longer having diarrhea and her stools have returned back to their normal baseline. Points to an area in her left abdomen and says that she has episodes of sharp stabbing pain in that area-- which is where she has always had her episodes of severe pain.  She is also requesting refill on pain medications. Says that even when her pain was stable in the past she was requiring higher dose of pain medication. She is completely out of pain medication.     Home Meds:   Outpatient Prescriptions Prior to Visit  Medication Sig Dispense Refill  . aspirin-acetaminophen-caffeine (EXCEDRIN MIGRAINE) 250-250-65 MG per tablet Take 1 tablet by mouth every 6 (six) hours as needed for headache.    . Chlorpheniramine-DM (CORICIDIN HBP COUGH/COLD PO) Take 1 tablet by mouth as  needed (cough and cold like symptoms).    . furosemide (LASIX) 40 MG tablet Take 40 mg by mouth daily as needed for fluid.    Marland Kitchen HYDROcodone-acetaminophen (NORCO) 5-325 MG per tablet Take 1 tablet by mouth every 4 (four) hours as needed. 60 tablet 0  . levothyroxine (SYNTHROID, LEVOTHROID) 137 MCG tablet Take 1 tablet (137 mcg total) by mouth daily before breakfast. 30 tablet 3  . nortriptyline (PAMELOR) 75 MG capsule Take 1 capsule (75 mg total) by mouth at bedtime. 30 capsule 5  . polyethylene glycol (MIRALAX / GLYCOLAX) packet Take 17 g by mouth daily as needed.    . predniSONE (DELTASONE) 5 MG tablet Take 2 tablets (10 mg total) by mouth daily with breakfast. 60 tablet 0  . RABEprazole (ACIPHEX) 20 MG tablet Take 1 tablet (20 mg total) by mouth 2 (two) times daily. 60 tablet 5  . rizatriptan (MAXALT-MLT) 10 MG disintegrating tablet Take 1 tablet by mouth as needed.  0  . simethicone (MYLICON) 125 MG chewable tablet Chew 125 mg by mouth every 6 (six) hours as needed for flatulence.    . sucralfate (CARAFATE) 1 G tablet Take 1 tablet by mouth 4 (four) times daily.  3  . topiramate (TOPAMAX) 50 MG tablet Take 50 mg by mouth at bedtime.    . ALPRAZolam (XANAX) 0.5 MG tablet Take 0.5 mg by mouth 3 (three) times daily as needed for anxiety.    . ciprofloxacin (  CIPRO) 500 MG tablet Take 1 tablet (500 mg total) by mouth once. (Patient not taking: Reported on 07/08/2014) 28 tablet 0  . dexamethasone (DECADRON) 1 MG tablet Take 1 mg by mouth 2 (two) times daily with a meal.    . levonorgestrel-ethinyl estradiol (ENPRESSE,TRIVORA) tablet Take 1 tablet by mouth daily.    . metoprolol succinate (TOPROL-XL) 50 MG 24 hr tablet Take 1 tablet (50 mg total) by mouth daily. Take with or immediately following a meal. 90 tablet 3  . metroNIDAZOLE (FLAGYL) 500 MG tablet Take 1 tablet (500 mg total) by mouth 2 (two) times daily. (Patient not taking: Reported on 07/08/2014) 28 tablet 0  . ondansetron (ZOFRAN) 4 MG tablet  Take 1 tablet (4 mg total) by mouth every 8 (eight) hours as needed for nausea or vomiting. (Patient not taking: Reported on 07/08/2014) 20 tablet 0  . potassium chloride SA (K-DUR,KLOR-CON) 20 MEQ tablet Take 20 mEq by mouth 2 (two) times daily.    . pregabalin (LYRICA) 225 MG capsule Take 1 capsule (225 mg total) by mouth 2 (two) times daily. (Patient not taking: Reported on 06/29/2014) 60 capsule 3  . traMADol (ULTRAM) 50 MG tablet Take 1 tablet (50 mg total) by mouth every 6 (six) hours as needed. (Patient not taking: Reported on 07/08/2014) 30 tablet 0  . Vitamin D, Ergocalciferol, (DRISDOL) 50000 UNITS CAPS capsule Take 1 capsule (50,000 Units total) by mouth every 7 (seven) days. (Patient not taking: Reported on 07/08/2014) 12 capsule 0  . oxyCODONE-acetaminophen (PERCOCET/ROXICET) 5-325 MG per tablet Take 1 tablet by mouth every 6 (six) hours as needed for severe pain. (Patient not taking: Reported on 07/08/2014) 15 tablet 0  . predniSONE (DELTASONE) 20 MG tablet 3 tabs poqday 1-2, 2 tabs poqday 3-4, 1 tab poqday 5-6 12 tablet 0   No facility-administered medications prior to visit.    Allergies:  Allergies  Allergen Reactions  . Ibuprofen Nausea Only    Bad stomach pains  . Toradol [Ketorolac Tromethamine] Anxiety  . Aspirin Hives  . Cleocin [Clindamycin Hcl] Itching and Rash      Review of Systems: See HPI for pertinent ROS. All other ROS negative.    Physical Exam: Blood pressure 140/102, pulse 96, temperature 98.5 F (36.9 C), temperature source Oral, resp. rate 18, weight 192 lb (87.091 kg)., Body mass index is 35.7 kg/(m^2). General:  WF. Appears in no acute distress. Neck: Supple. No thyromegaly. No lymphadenopathy. Lungs: Clear bilaterally to auscultation without wheezes, rales, or rhonchi. Breathing is unlabored. Heart: Regular rhythm. No murmurs, rubs, or gallops. Abdomen: Soft,  non-distended with normoactive bowel sounds. No hepatomegaly. No rebound/guarding. No obvious  abdominal masses. Mild tenderness with palpation left abdomen. Msk:  Strength and tone normal for age. Neuro: Alert and oriented X 3. Moves all extremities spontaneously. Gait is normal. CNII-XII grossly in tact. Psych:  Responds to questions appropriately with a normal affect.     ASSESSMENT AND PLAN:  39 y.o. year old female with  1. Chronic abdominal pain Will restart prednisone and will give her some pain medication to use when absolutely necessary.  Has appointment to follow-up with Dr. Tanya NonesPickard tomorrow and [facility] - predniSONE (DELTASONE) 20 MG tablet; Take 2 daily for 5 days then 1 daily for 4 days  Dispense: 14 tablet; Refill: 0 - oxyCODONE-acetaminophen (PERCOCET/ROXICET) 5-325 MG per tablet; Take 1 tablet by mouth every 6 (six) hours as needed for severe pain.  Dispense:  60 tablet; Refill: 0   Signed, [address] [place], [place], [facility] 07/08/2014 1:17 PM

## 2014-07-09 ENCOUNTER — Encounter: Payer: Self-pay | Admitting: Family Medicine

## 2014-07-09 ENCOUNTER — Ambulatory Visit (INDEPENDENT_AMBULATORY_CARE_PROVIDER_SITE_OTHER): Payer: Medicare Other | Admitting: Family Medicine

## 2014-07-09 ENCOUNTER — Ambulatory Visit
Admission: RE | Admit: 2014-07-09 | Discharge: 2014-07-09 | Disposition: A | Discharge status: Home / Self Care | Payer: Medicare Other | Source: Ambulatory Visit | Attending: Family Medicine | Admitting: Family Medicine

## 2014-07-09 VITALS — BP 118/82 | HR 88 | Temp 98.0°F | Resp 16 | Ht 61.5 in | Wt 190.0 lb

## 2014-07-09 DIAGNOSIS — R1084 Generalized abdominal pain: Secondary | ICD-10-CM | POA: Diagnosis not present

## 2014-07-09 LAB — CBC W/MCH & 3 PART DIFF
HCT: 43.1 % (ref 36.0–46.0)
HEMOGLOBIN: 14.4 g/dL (ref 12.0–15.0)
Lymphocytes Relative: 17 % (ref 12–46)
Lymphs Abs: 2.2 10*3/uL (ref 0.7–4.0)
MCH: 33.6 pg (ref 26.0–34.0)
MCHC: 33.4 g/dL (ref 30.0–36.0)
MCV: 100.7 fL — ABNORMAL HIGH (ref 78.0–100.0)
Neutro Abs: 10.3 10*3/uL — ABNORMAL HIGH (ref 1.7–7.7)
Neutrophils Relative %: 79 % — ABNORMAL HIGH (ref 43–77)
Platelets: 208 10*3/uL (ref 150–400)
RBC: 4.28 MIL/uL (ref 3.87–5.11)
RDW: 13.6 % (ref 11.5–15.5)
WBC mixed population %: 4 % (ref 3–18)
WBC mixed population: 0.5 10*3/uL (ref 0.1–1.8)
WBC: 13 10*3/uL — AB (ref 4.0–10.5)

## 2014-07-09 MED ORDER — METHYLPREDNISOLONE ACETATE 80 MG/ML IJ SUSP
80.0000 mg | Freq: Once | INTRAMUSCULAR | Status: AC
  Administered 2014-07-09: 80 mg via INTRAMUSCULAR

## 2014-07-09 NOTE — Progress Notes (Signed)
Subjective:    Patient ID: Dorothy Dyer, female    DOB: 09/20/1975, 39 y.o.   MRN: [id number]  HPI 06/23/14 I have reviewed the patient's extensive past medical history. Patient has had chronic gastrointestinal problems. Patient had a gastric bypass. She then developed a small bowel obstruction. She's had numerous colonoscopies, endoscopies, ERCPs to try to determine the root cause of the patient's chronic abdominal pain. She has been diagnosed with dysmotility, irritable bowel syndrome, and chronic abdominal pain.  I reviewed the recent office visit here at my office with my physician assistant. Shortly thereafter she developed 15/10 lower abdominal pain complicated by diarrhea and nausea and vomiting. She went to the emergency room concerned that she had a bowel obstruction. CT of the abdomen and pelvis there revealed intestinal wall thickening and inflammation consistent with enteritis in the duodenum and small intestine either infectious versus inflammation. She was treated empirically for colitis with Cipro and Flagyl and was given narcotic pain medication and nausea medicine. Her pain is no better. She continues to have diarrhea nausea and vomiting. Her weight today is 14 pounds heavier than her last office visit. I weighed the patient personally and I know that the weight is correct today. She tells me that the weight is wrong. She is also concerned by a wound on her anterior right shin. There is a 1.5 cm erythematous central ulcer that is weeping clear serous fluid. She has +1 pitting edema in her legs and chronic venous insufficiency and venous stasis dermatitis on the leg. The wound was caused by a small trauma over 2 weeks ago however it will not heal due to the weeping edema.  Of note, the patient was declined by local gastroenterologist.  At that time, my plan was:  I am concerned the patient may have noninfectious colitis from possibly inflammatory bowel disease such as Crohn's disease.  She isn't moderate to severe pain. She is requesting more pain medication. I will try empirically treating the patient for inflammatory bowel disease with a prednisone taper. I explained this to the patient. This is not a diagnosis but honestly empiric therapy only. Recheck next week. If symptoms improve, she may benefit from chronic immunosuppression. I treated the wound on her right leg is a venous stasis ulcer by patient the patient in an Radio broadcast assistant. I recommended she elevate her legs is much as possible. I will recheck her next week to replace the Unna boot. I will also refer the patient to gastroenterology at [facility].  06/29/14 Patient had been on antibiotics for over 4 days when I saw her. The pain was worsening. After beginning the prednisone, the patient's symptoms improved dramatically. She states that the severe abdominal pain has now improved. She is now at her baseline chronic abdominal pain. The nausea has improved. She is currently on 20 mg a day of prednisone. The question is if she responding to the prednisone or a she responding to the antibiotics. She is also taking Ultram sparingly for abdominal pain. The wound on her right shin is much improved. It is now 5 mm x 5 mm. The edema in her legs is much improved.  However the patient has lost substantial weight. She states that she is taking fluids and eating normally. She denies failure to tolerate by mouth. However she has lost almost 18 pounds. Patient attributes this to stopping Lyrica on her own and also taking more fluid pills.  At that time, my plan was: Venous stasis ulcer is improving.  I replaced the [employer] boot. Recheck in one week. Given the patient's rapid weight loss, I encouraged her to drink more fluids and to discontinue her fluid pill to avoid dehydration. I will recheck her weight neck suite. I will also check a TSH to make sure her levothyroxine is appropriately dosed. I do believe the patient may have inflammatory bowel  disease. I'm willing to begin to wean her down on the prednisone is much as possible. Decrease prednisone to 10 mg a day and recheck in one week. I will wean the patient off prednisone as tolerated. She is scheduled to see a gastro[facility] May [facility]. I also made a referral to a pain clinic. Recheck next week.  07/09/14 Patient's sedimentation rate last week was 4 which is normal. Her white blood cell count was stable at 12 and this was in the clinical setting of glucocorticoids. Given the fact the diagnosis was in question, we quickly wean the patient down to 10 mg of prednisone last week. She also stopped Cipro and Flagyl. Shortly thereafter her abdominal pain began to worsen. She always has a baseline abdominal pain however it has now risen to 10 on a scale of 10. She reports daily nausea. She reports inability to tolerate by mouth. She is still passing flatus. Her last bowel movement was yesterday. Patient was seen yesterday in this clinic by my physician's assistant. Her abdominal pain worsened. At that time [name] resumed 40 mg of prednisone a day and also gave her a prescription for oxycodone. She is here today for follow-up.  She continues to lose weight. Wt Readings from Last 3 Encounters:  07/09/14 190 lb (86.183 kg)  07/08/14 192 lb (87.091 kg)  06/29/14 194 lb (87.998 kg)    Past Medical History  Diagnosis Date  . Thyroid disease   . Cystitis   . Anemia   . Anxiety   . Arthritis   . Clotting disorder     undetermined  . Acid reflux   . Neuromuscular disorder   . Hypertension   . Osteoporosis   . 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 Cholecystectomy    . Small intestine surgery  2004    exploratory  . Gastric bypass  05/2001   Current Outpatient Prescriptions on File Prior to Visit  Medication Sig Dispense Refill  . ALPRAZolam (XANAX) 0.5 MG tablet Take 0.5 mg by mouth 3 (three) times daily as  needed for anxiety.    Marland Kitchen. aspirin-acetaminophen-caffeine (EXCEDRIN MIGRAINE) 250-250-65 MG per tablet Take 1 tablet by mouth every 6 (six) hours as needed for headache.    . Chlorpheniramine-DM (CORICIDIN HBP COUGH/COLD PO) Take 1 tablet by mouth as needed (cough and cold like symptoms).    . furosemide (LASIX) 40 MG tablet Take 40 mg by mouth daily as needed for fluid.    Marland Kitchen. HYDROcodone-acetaminophen (NORCO) 5-325 MG per tablet Take 1 tablet by mouth every 4 (four) hours as needed. 60 tablet 0  . levonorgestrel-ethinyl estradiol (ENPRESSE,TRIVORA) tablet Take 1 tablet by mouth daily.    Marland Kitchen. levothyroxine (SYNTHROID, LEVOTHROID) 137 MCG tablet Take 1 tablet (137 mcg total) by mouth daily before breakfast. 30 tablet 3  . metoprolol succinate (TOPROL-XL) 50 MG 24 hr tablet Take 1 tablet (50 mg total) by mouth daily. Take with or immediately following a meal. 90 tablet 3  . metroNIDAZOLE (FLAGYL) 500 MG tablet Take 1 tablet (500 mg total) by mouth  2 (two) times daily. 28 tablet 0  . nortriptyline (PAMELOR) 75 MG capsule Take 1 capsule (75 mg total) by mouth at bedtime. 30 capsule 5  . ondansetron (ZOFRAN) 4 MG tablet Take 1 tablet (4 mg total) by mouth every 8 (eight) hours as needed for nausea or vomiting. 20 tablet 0  . oxyCODONE-acetaminophen (PERCOCET/ROXICET) 5-325 MG per tablet Take 1 tablet by mouth every 6 (six) hours as needed for severe pain. 60 tablet 0  . polyethylene glycol (MIRALAX / GLYCOLAX) packet Take 17 g by mouth daily as needed.    . potassium chloride SA (K-DUR,KLOR-CON) 20 MEQ tablet Take 20 mEq by mouth 2 (two) times daily.    . predniSONE (DELTASONE) 20 MG tablet Take 2 daily for 5 days then 1 daily for 4 days 14 tablet 0  . predniSONE (DELTASONE) 5 MG tablet Take 2 tablets (10 mg total) by mouth daily with breakfast. 60 tablet 0  . pregabalin (LYRICA) 225 MG capsule Take 1 capsule (225 mg total) by mouth 2 (two) times daily. 60 capsule 3  . RABEprazole (ACIPHEX) 20 MG tablet Take  1 tablet (20 mg total) by mouth 2 (two) times daily. 60 tablet 5  . rizatriptan (MAXALT-MLT) 10 MG disintegrating tablet Take 1 tablet by mouth as needed.  0  . simethicone (MYLICON) 125 MG chewable tablet Chew 125 mg by mouth every 6 (six) hours as needed for flatulence.    . sucralfate (CARAFATE) 1 G tablet Take 1 tablet by mouth 4 (four) times daily.  3  . topiramate (TOPAMAX) 50 MG tablet Take 50 mg by mouth at bedtime.    . traMADol (ULTRAM) 50 MG tablet Take 1 tablet (50 mg total) by mouth every 6 (six) hours as needed. 30 tablet 0  . Vitamin D, Ergocalciferol, (DRISDOL) 50000 UNITS CAPS capsule Take 1 capsule (50,000 Units total) by mouth every 7 (seven) days. 12 capsule 0   No current facility-administered medications on file prior to visit.   Allergies  Allergen Reactions  . Ibuprofen Nausea Only    Bad stomach pains  . Toradol [Ketorolac Tromethamine] Anxiety  . Aspirin Hives  . Cleocin [Clindamycin Hcl] Itching and Rash   History   Social History  . Marital Status: Divorced    Spouse Name: N/A  . Number of Children: N/A  . Years of Education: N/A   Occupational History  . Not on file.   Social History Main Topics  . Smoking status: Current Every Day Smoker -- 0.50 packs/day    Types: Cigarettes  . Smokeless tobacco: Never Used  . Alcohol Use: 6.0 oz/week    10 Shots of liquor per week  . Drug Use: No  . Sexual Activity: Yes    Birth Control/ Protection: Pill   Other Topics Concern  . Not on file   Social History Narrative      Review of Systems  All other systems reviewed and are negative.      Objective:   Physical Exam  Constitutional: She appears well-developed and well-nourished. No distress.  Cardiovascular: Normal rate, regular rhythm and normal heart sounds.   No murmur heard. Pulmonary/Chest: Effort normal and breath sounds normal.  Abdominal: Soft. She exhibits distension. Bowel sounds are increased. There is tenderness. There is no  rebound and no guarding.  Musculoskeletal: She exhibits no edema.  Skin: She is not diaphoretic. No erythema.          Assessment & Plan:  Acute generalized abdominal pain  It  is possible the patient does have inflammatory bowel disease such as Crohn's, and when I quickly weaned her down on the steroids, the colitis resumed. If that is the cas,e high-dose steroids should calm down the patient quickly. The other possibility is this is infectious colitis that resumed after the patient discontinue the antibiotics. However she took a full 10 day course. Furthermore she is afebrile. I checked a stat CBC, and her white blood cell count was 13 which is essentially unchanged. Therefore, I believe infectious colitis is unlikely. The other possibility would be a bowel obstruction stemming from her numerous intestinal surgeries. I will send the patient for abdominal x-rays immediately to rule out signs of bowel obstruction. If there is evidence of a bowel obstruction on x-rays, I will recommend that she go to the emergency room for a CT scan and management of a bowel obstruction. Another possibility would be malingering.  I will give the patient 80 mg of Depo-Medrol IM immediately to try to treat a possible Crohn's flare and I will send her immediately to get x-rays of the abdomen. If the x-rays are negative, I will treat the patient with high-dose steroids. I will slowly wean the patient off steroids, 5 mg per week, until she is seen by GI. Obviously if the x-rays are abnormal she would go to the emergency room. If the pain worsens she is to go to the emergency room for a CT scan of the abdomen.

## 2014-07-09 NOTE — Addendum Note (Signed)
Addended by: Legrand RamsWILLIS, SANDY B on: 07/09/2014 03:37 PM   Modules accepted: Orders

## 2014-07-13 ENCOUNTER — Ambulatory Visit (INDEPENDENT_AMBULATORY_CARE_PROVIDER_SITE_OTHER): Payer: Medicare Other | Admitting: Family Medicine

## 2014-07-13 ENCOUNTER — Encounter: Payer: Self-pay | Admitting: Family Medicine

## 2014-07-13 VITALS — BP 126/84 | HR 100 | Temp 98.0°F | Resp 16 | Ht 61.5 in | Wt 190.0 lb

## 2014-07-13 DIAGNOSIS — K5289 Other specified noninfective gastroenteritis and colitis: Secondary | ICD-10-CM | POA: Diagnosis not present

## 2014-07-13 MED ORDER — PREDNISONE 20 MG PO TABS
60.0000 mg | ORAL_TABLET | Freq: Every day | ORAL | Status: DC
Start: 2014-07-13 — End: 2015-01-18

## 2014-07-13 NOTE — Progress Notes (Signed)
Subjective:    Patient ID: [name], female    DOB: 1976-02-17, 39 y.o.   MRN: [id number]  HPI 06/23/14 I have reviewed the patient's extensive past medical history. Patient has had chronic gastrointestinal problems. Patient had a gastric bypass. She then developed a small bowel obstruction. She's had numerous colonoscopies, endoscopies, ERCPs to try to determine the root cause of the patient's chronic abdominal pain. She has been diagnosed with dysmotility, irritable bowel syndrome, and chronic abdominal pain.  I reviewed the recent office visit here at my office with my physician assistant. Shortly thereafter she developed 15/10 lower abdominal pain complicated by diarrhea and nausea and vomiting. She went to the emergency room concerned that she had a bowel obstruction. CT of the abdomen and pelvis there revealed intestinal wall thickening and inflammation consistent with enteritis in the duodenum and small intestine either infectious versus inflammation. She was treated empirically for colitis with Cipro and Flagyl and was given narcotic pain medication and nausea medicine. Her pain is no better. She continues to have diarrhea nausea and vomiting. Her weight today is 14 pounds heavier than her last office visit. I weighed the patient personally and I know that the weight is correct today. She tells me that the weight is wrong. She is also concerned by a wound on her anterior right shin. There is a 1.5 cm erythematous central ulcer that is weeping clear serous fluid. She has +1 pitting edema in her legs and chronic venous insufficiency and venous stasis dermatitis on the leg. The wound was caused by a small trauma over 2 weeks ago however it will not heal due to the weeping edema.  Of note, the patient was declined by local gastroenterologist.  At that time, my plan was:  I am concerned the patient may have noninfectious colitis from possibly inflammatory bowel disease such as Crohn's disease.  She isn't moderate to severe pain. She is requesting more pain medication. I will try empirically treating the patient for inflammatory bowel disease with a prednisone taper. I explained this to the patient. This is not a diagnosis but honestly empiric therapy only. Recheck next week. If symptoms improve, she may benefit from chronic immunosuppression. I treated the wound on her right leg is a venous stasis ulcer by patient the patient in an Radio broadcast assistant. I recommended she elevate her legs is much as possible. I will recheck her next week to replace the [place]na boot. I will also refer the patient to gastroenterology at [facility].  06/29/14 Patient had been on antibiotics for over 4 days when I saw her. The pain was worsening. After beginning the prednisone, the patient's symptoms improved dramatically. She states that the severe abdominal pain has now improved. She is now at her baseline chronic abdominal pain. The nausea has improved. She is currently on 20 mg a day of prednisone. The question is if she responding to the prednisone or a she responding to the antibiotics. She is also taking Ultram sparingly for abdominal pain. The wound on her right shin is much improved. It is now 5 mm x 5 mm. The edema in her legs is much improved.  However the patient has lost substantial weight. She states that she is taking fluids and eating normally. She denies failure to tolerate by mouth. However she has lost almost 18 pounds. Patient attributes this to stopping Lyrica on her own and also taking more fluid pills.  At that time, my plan was: Venous stasis ulcer is improving.  I replaced the [employer]. Recheck in one week. Given the patient's rapid weight loss, I encouraged her to drink more fluids and to discontinue her fluid pill to avoid dehydration. I will recheck her weight neck suite. I will also check a TSH to make sure her levothyroxine is appropriately dosed. I do believe the patient may have inflammatory bowel  disease. I'm willing to begin to wean her down on the prednisone is much as possible. Decrease prednisone to 10 mg a day and recheck in one week. I will wean the patient off prednisone as tolerated. She is scheduled to see a gastroenterologist at [facility] on May 18. I also made a referral to a pain clinic. Recheck next week.  07/09/14 Patient's sedimentation rate last week was 4 which is normal. Her white blood cell count was stable at 12 and this was in the clinical setting of glucocorticoids. Given the fact the diagnosis was in question, we quickly wean the patient down to 10 mg of prednisone last week. She also stopped Cipro and Flagyl. Shortly thereafter her abdominal pain began to worsen. She always has a baseline abdominal pain however it has now risen to 10 on a scale of 10. She reports daily nausea. She reports inability to tolerate by mouth. She is still passing flatus. Her last bowel movement was yesterday. Patient was seen yesterday in this clinic by my physician's assistant. Her abdominal pain worsened. At that time Shon Hale resumed 40 mg of prednisone a day and also gave her a prescription for oxycodone. She is here today for follow-up.  She continues to lose weight.  At that time, my plan was: Wt Readings from Last 3 Encounters:  07/09/14 190 lb (86.183 kg)  07/08/14 192 lb (87.091 kg)  06/29/14 194 lb (87.998 kg)   It is possible the patient does have inflammatory bowel disease such as Crohn's, and when I quickly weaned her down on the steroids, the colitis resumed. If that is the case, high-dose steroids should calm down the patient quickly. The other possibility is this is infectious colitis that resumed after the patient discontinue the antibiotics. However she took a full 10 day course. Furthermore she is afebrile. I checked a stat CBC, and her white blood cell count was 13 which is essentially unchanged. Therefore, I believe infectious colitis is unlikely. The other possibility would be a  bowel obstruction stemming from her numerous intestinal surgeries. I will send the patient for abdominal x-rays immediately to rule out signs of bowel obstruction. If there is evidence of a bowel obstruction on x-rays, I will recommend that she go to the emergency room for a CT scan and management of a bowel obstruction. Another possibility would be malingering.  I will give the patient 80 mg of Depo-Medrol IM immediately to try to treat a possible Crohn's flare and I will send her immediately to get x-rays of the abdomen. If the x-rays are negative, I will treat the patient with high-dose steroids. I will slowly wean the patient off steroids, 5 mg per week, until she is seen by GI. Obviously if the x-rays are abnormal she would go to the emergency room. If the pain worsens she is to go to the emergency room for a CT scan of the abdomen.  07/13/14 X-ray revealed no evidence of a bowel obstruction. She's been taking 60 mg of prednisone over the weekend per day. She is here today for follow-up. Patient is doing much better since increasing the dose of prednisone.  Her pain is back to her baseline. She looks much better today. Her weight has stabilized. She is no longer losing weight. Now she is complaining of constipation however I attribute this to the narcotics she is taking.   Past Medical History  Diagnosis Date  . Thyroid disease   . Cystitis   . Anemia   . Anxiety   . Arthritis   . Clotting disorder     undetermined  . Acid reflux   . Neuromuscular disorder   . Hypertension   . Osteoporosis   . 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 Cholecystectomy    . Small intestine surgery  2004    exploratory  . Gastric bypass  05/2001   Current Outpatient Prescriptions on File Prior to Visit  Medication Sig Dispense Refill  . ALPRAZolam (XANAX) 0.5 MG tablet Take 0.5 mg by mouth 3 (three) times daily as needed for anxiety.    Marland Kitchen.  aspirin-acetaminophen-caffeine (EXCEDRIN MIGRAINE) 250-250-65 MG per tablet Take 1 tablet by mouth every 6 (six) hours as needed for headache.    . Chlorpheniramine-DM (CORICIDIN HBP COUGH/COLD PO) Take 1 tablet by mouth as needed (cough and cold like symptoms).    . furosemide (LASIX) 40 MG tablet Take 40 mg by mouth daily as needed for fluid.    Marland Kitchen. HYDROcodone-acetaminophen (NORCO) 5-325 MG per tablet Take 1 tablet by mouth every 4 (four) hours as needed. 60 tablet 0  . levonorgestrel-ethinyl estradiol (ENPRESSE,TRIVORA) tablet Take 1 tablet by mouth daily.    Marland Kitchen. levothyroxine (SYNTHROID, LEVOTHROID) 137 MCG tablet Take 1 tablet (137 mcg total) by mouth daily before breakfast. 30 tablet 3  . metoprolol succinate (TOPROL-XL) 50 MG 24 hr tablet Take 1 tablet (50 mg total) by mouth daily. Take with or immediately following a meal. 90 tablet 3  . nortriptyline (PAMELOR) 75 MG capsule Take 1 capsule (75 mg total) by mouth at bedtime. 30 capsule 5  . ondansetron (ZOFRAN) 4 MG tablet Take 1 tablet (4 mg total) by mouth every 8 (eight) hours as needed for nausea or vomiting. 20 tablet 0  . oxyCODONE-acetaminophen (PERCOCET/ROXICET) 5-325 MG per tablet Take 1 tablet by mouth every 6 (six) hours as needed for severe pain. 60 tablet 0  . polyethylene glycol (MIRALAX / GLYCOLAX) packet Take 17 g by mouth daily as needed.    . potassium chloride SA (K-DUR,KLOR-CON) 20 MEQ tablet Take 20 mEq by mouth 2 (two) times daily.    . predniSONE (DELTASONE) 20 MG tablet Take 2 daily for 5 days then 1 daily for 4 days 14 tablet 0  . pregabalin (LYRICA) 225 MG capsule Take 1 capsule (225 mg total) by mouth 2 (two) times daily. 60 capsule 3  . RABEprazole (ACIPHEX) 20 MG tablet Take 1 tablet (20 mg total) by mouth 2 (two) times daily. 60 tablet 5  . rizatriptan (MAXALT-MLT) 10 MG disintegrating tablet Take 1 tablet by mouth as needed.  0  . simethicone (MYLICON) 125 MG chewable tablet Chew 125 mg by mouth every 6 (six) hours  as needed for flatulence.    . sucralfate (CARAFATE) 1 G tablet Take 1 tablet by mouth 4 (four) times daily.  3  . topiramate (TOPAMAX) 50 MG tablet Take 50 mg by mouth at bedtime.    . traMADol (ULTRAM) 50 MG tablet Take 1 tablet (50 mg total) by mouth every 6 (six) hours as needed. 30 tablet 0  .  Vitamin D, Ergocalciferol, (DRISDOL) 50000 UNITS CAPS capsule Take 1 capsule (50,000 Units total) by mouth every 7 (seven) days. 12 capsule 0   No current facility-administered medications on file prior to visit.   Allergies  Allergen Reactions  . Ibuprofen Nausea Only    Bad stomach pains  . Toradol [Ketorolac Tromethamine] Anxiety  . Aspirin Hives  . Cleocin [Clindamycin Hcl] Itching and Rash   History   Social History  . Marital Status: Divorced    Spouse Name: N/A  . Number of Children: N/A  . Years of Education: N/A   Occupational History  . Not on file.   Social History Main Topics  . Smoking status: Current Every Day Smoker -- 0.50 packs/day    Types: Cigarettes  . Smokeless tobacco: Never Used  . Alcohol Use: 6.0 oz/week    10 Shots of liquor per week  . Drug Use: No  . Sexual Activity: Yes    Birth Control/ Protection: Pill   Other Topics Concern  . Not on file   Social History Narrative      Review of Systems  All other systems reviewed and are negative.      Objective:   Physical Exam  Constitutional: She appears well-developed and well-nourished. No distress.  Cardiovascular: Normal rate, regular rhythm and normal heart sounds.   No murmur heard. Pulmonary/Chest: Effort normal and breath sounds normal.  Abdominal: Soft. Bowel sounds are normal. She exhibits no distension. There is no tenderness. There is no rebound and no guarding.  Musculoskeletal: She exhibits no edema.  Skin: She is not diaphoretic. No erythema.          Assessment & Plan:   Other noninfectious gastroenteritis - Plan: predniSONE (DELTASONE) 20 MG tablet  I believe the  patient has inflammatory bowel disease and I believe she has Crohn's. Therefore I'm going to try to decrease the patient on the prednisone by 10 mg every week. I want her to start 50 mg of prednisone by mouth daily for the next week and then decrease to 40 mg by mouth daily the second week and then 30 mg by mouth daily the third week and so on until she sees gastroenterology. I have warned the patient about prolonged steroid use and the risk of iatrogenic Cushing's disease, osteoporosis, weight gain, and diabetes. She understands and would like to try to wean off the steroids as her body will tolerate. Ultimately, I hope that GI can help [place] with a definitive diagnosis through endoscopy and help [place] find a maintenance medication that will maintain remission. I recommended that she use MiraLAX to help manage the constipation. Also gave her samples of movantik 25 mg by mouth daily as needed for narcotic induced constipation. I would like to see the patient back in follow-up after she sees the gastroenterologist later this month.  I gave the patient a prescription for 60 mg of prednisone a day but I explained to the patient how I want her to take it. She understands that she is to decrease the dose to 50 mg a day and then by 10 mg each week until she sees gastroenterology.

## 2014-07-17 ENCOUNTER — Encounter (HOSPITAL_COMMUNITY): Payer: Self-pay | Admitting: Emergency Medicine

## 2014-07-17 ENCOUNTER — Telehealth: Payer: Self-pay | Admitting: Family Medicine

## 2014-07-17 ENCOUNTER — Emergency Department (HOSPITAL_COMMUNITY)
Admission: EM | Admit: 2014-07-17 | Discharge: 2014-07-17 | Disposition: A | Discharge status: Home / Self Care | Payer: Medicare Other | Source: Home / Self Care | Attending: Family Medicine | Admitting: Family Medicine

## 2014-07-17 DIAGNOSIS — S91311A Laceration without foreign body, right foot, initial encounter: Secondary | ICD-10-CM

## 2014-07-17 DIAGNOSIS — Z23 Encounter for immunization: Secondary | ICD-10-CM | POA: Diagnosis not present

## 2014-07-17 MED ORDER — TETANUS-DIPHTH-ACELL PERTUSSIS 5-2.5-18.5 LF-MCG/0.5 IM SUSP
0.5000 mL | Freq: Once | INTRAMUSCULAR | Status: AC
  Administered 2014-07-17: 0.5 mL via INTRAMUSCULAR

## 2014-07-17 MED ORDER — TETANUS-DIPHTH-ACELL PERTUSSIS 5-2.5-18.5 LF-MCG/0.5 IM SUSP
INTRAMUSCULAR | Status: AC
  Filled 2014-07-17: qty 0.5

## 2014-07-17 NOTE — ED Provider Notes (Addendum)
CSN: [id number]     Arrival date & time 07/17/14  1721 History   First MD Initiated Contact with Patient 07/17/14 1934     Chief Complaint  Patient presents with  . Extremity Laceration   This is a 39 yo separated woman originally from [place] who moved to [place] with her 41 year old autistic son.  She has had a long h/o abdominal pains, recently dx'd with Crohn's and on high steroid regimen with recent diarrhea and cramps.  She [facility] the 18th of the month for definitive treatment  She cut the right achilles area 2 days ago and it has not stopped bleeding.  She was shaving her legs.  There is diffuse superficial pain but no swelling.  No skin discoloration that is different from left leg.  Able to walk.  Unsure of last tetanus. The history is provided by the patient.    Past Medical History  Diagnosis Date  . Thyroid disease   . Cystitis   . Anemia   . Anxiety   . Arthritis   . Clotting disorder     undetermined  . Acid reflux   . Neuromuscular disorder   . Hypertension   . Osteoporosis   . 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 Cholecystectomy    . Small intestine surgery  2004    exploratory  . Gastric bypass  05/2001   Family History  Problem Relation Age of Onset  . Depression Mother   . Diabetes Mother   . Hyperlipidemia Mother   . Depression Father   . Hearing loss Father   . Hyperlipidemia Father   . Hypertension Father   . Learning disabilities Father   . Mental illness Father   . Asthma Brother   . Alcohol abuse Maternal Grandmother   . Arthritis Maternal Grandmother   . Cancer Maternal Grandmother   . Cancer Maternal Grandfather   . Mental illness Paternal Grandmother   . Heart disease Paternal Grandfather   . Stroke Paternal Grandfather    History  Substance Use Topics  . Smoking status: Current Every Day Smoker -- 0.50 packs/day    Types: Cigarettes  . Smokeless tobacco:  Never Used  . Alcohol Use: 6.0 oz/week    10 Shots of liquor per week   OB History    No data available     Review of Systems  Constitutional: Positive for fever. Negative for chills.  HENT: Negative.  Negative for congestion.   Eyes: Negative.   Respiratory: Negative.   Cardiovascular: Negative.   Gastrointestinal: Positive for abdominal pain, diarrhea and abdominal distention. Negative for nausea, vomiting, constipation, blood in stool, anal bleeding and rectal pain.  Endocrine: Negative.   Genitourinary: Negative.   Musculoskeletal: Positive for arthralgias. Negative for joint swelling.  Skin: Positive for wound. Negative for color change.  Allergic/Immunologic: Negative.   Neurological: Negative.   Psychiatric/Behavioral: Negative.     Allergies  Ibuprofen; Toradol; Aspirin; and Cleocin  Home Medications   Prior to Admission medications   Medication Sig Start Date End Date Taking? Authorizing Provider  levothyroxine (SYNTHROID, LEVOTHROID) 137 MCG tablet Take 1 tablet (137 mcg total) by mouth daily before breakfast. 07/08/14  Yes Donita BrooksWarren T Pickard, MD  metoprolol succinate (TOPROL-XL) 50 MG 24 hr tablet Take 1 tablet (50 mg total) by mouth daily. Take with or immediately following a meal. 07/01/14  Yes Donita BrooksWarren T Pickard, MD  predniSONE (  DELTASONE) 20 MG tablet Take 2 daily for 5 days then 1 daily for 4 days 07/08/14  Yes Dorena Bodo, PA-C  RABEprazole (ACIPHEX) 20 MG tablet Take 1 tablet (20 mg total) by mouth 2 (two) times daily. 06/17/14  Yes Mary B Dixon, PA-C  sucralfate (CARAFATE) 1 G tablet Take 1 tablet by mouth 4 (four) times daily. 03/11/14  Yes Historical Provider, MD  ALPRAZolam Prudy Feeler) 0.5 MG tablet Take 0.5 mg by mouth 3 (three) times daily as needed for anxiety.    Historical Provider, MD  aspirin-acetaminophen-caffeine (EXCEDRIN MIGRAINE) [phone] MG per tablet Take 1 tablet by mouth every 6 (six) hours as needed for headache.    Historical Provider, MD   Chlorpheniramine-DM (CORICIDIN HBP COUGH/COLD PO) Take 1 tablet by mouth as needed (cough and cold like symptoms).    Historical Provider, MD  furosemide (LASIX) 40 MG tablet Take 40 mg by mouth daily as needed for fluid.    Historical Provider, MD  HYDROcodone-acetaminophen (NORCO) 5-325 MG per tablet Take 1 tablet by mouth every 4 (four) hours as needed. 06/17/14   Dorena Bodo, PA-C  levonorgestrel-ethinyl estradiol (ENPRESSE,TRIVORA) tablet Take 1 tablet by mouth daily.    Historical Provider, MD  nortriptyline (PAMELOR) 75 MG capsule Take 1 capsule (75 mg total) by mouth at bedtime. 06/30/14   Donita Brooks, MD  ondansetron (ZOFRAN) 4 MG tablet Take 1 tablet (4 mg total) by mouth every 8 (eight) hours as needed for nausea or vomiting. 06/18/14   Shon Baton, MD  oxyCODONE-acetaminophen (PERCOCET/ROXICET) 5-325 MG per tablet Take 1 tablet by mouth every 6 (six) hours as needed for severe pain. 07/08/14   Patriciaann Clan Dixon, PA-C  polyethylene glycol (MIRALAX / GLYCOLAX) packet Take 17 g by mouth daily as needed.    Historical Provider, MD  potassium chloride SA (K-DUR,KLOR-CON) 20 MEQ tablet Take 20 mEq by mouth 2 (two) times daily.    Historical Provider, MD  predniSONE (DELTASONE) 20 MG tablet Take 3 tablets (60 mg total) by mouth daily with breakfast. 07/13/14   Donita Brooks, MD  pregabalin (LYRICA) 225 MG capsule Take 1 capsule (225 mg total) by mouth 2 (two) times daily. 06/17/14   Patriciaann Clan Dixon, PA-C  rizatriptan (MAXALT-MLT) 10 MG disintegrating tablet Take 1 tablet by mouth as needed. 03/11/14   Historical Provider, MD  simethicone (MYLICON) 125 MG chewable tablet Chew 125 mg by mouth every 6 (six) hours as needed for flatulence.    Historical Provider, MD  topiramate (TOPAMAX) 50 MG tablet Take 50 mg by mouth at bedtime.    Historical Provider, MD  traMADol (ULTRAM) 50 MG tablet Take 1 tablet (50 mg total) by mouth every 6 (six) hours as needed. 06/24/14   Donita Brooks, MD  Vitamin D,  Ergocalciferol, (DRISDOL) 50000 UNITS CAPS capsule Take 1 capsule (50,000 Units total) by mouth every 7 (seven) days. 06/19/14   Patriciaann Clan Dixon, PA-C   BP 143/92 mmHg  Pulse 97  Temp(Src) 96.8 F (36 C) (Oral)  Resp 16  SpO2 98%  LMP 07/12/2014 Physical Exam  Constitutional: She appears well-developed and well-nourished. She appears distressed.  HENT:  Head: Normocephalic and atraumatic.  Mouth/Throat: Oropharynx is clear and moist.  Eyes: Pupils are equal, round, and reactive to light.  Neck: Normal range of motion. Neck supple.  Cardiovascular: Normal rate.   Pulmonary/Chest: Effort normal.  Abdominal: Soft. She exhibits mass. There is tenderness. There is no rebound and no guarding.  Musculoskeletal: Normal range of motion.  Neurological: She is alert.  Skin: Skin is warm and dry.  1/2 x 1 cm superficial right achilles epithelial denuding from shaver, dermabond applied after thorough soap and water scrub to seal the wound.  Psychiatric: She has a normal mood and affect. Her behavior is normal.    ED Course  Procedures (including critical care time) Labs Review Labs Reviewed - No data to display  Imaging Review No results found.   MDM  Laceration of heel, right, initial encounter recommend [facility] for the Crohn's with trial of probiotics and follow up with Dr. Tanya NonesPickard if not improving.  This was a simple laceration repair using Dermabond  Elvina SidleKurt Kahlen Morais, MD 07/17/14 1943  Elvina SidleKurt Talia Hoheisel, MD 08/02/14 1243

## 2014-07-17 NOTE — Telephone Encounter (Signed)
I reached patient.  Told me again what happened. Told me she can't even get the bandage off the wound.  Does not know when she last had a Tetanus shot.  Told to go to UC or ED TODAY!!!

## 2014-07-17 NOTE — Telephone Encounter (Signed)
Pt left voice mail on office phone at 4:55 PM 07/16/14.  States some sort of injury to foot/ankle from razor blade??  On Message pt states she cut her left foot yesterday "right where you would cut someone if you wanted them to fall down"  Now today she is unable to walk on left foot and very painful.  Wants advise.  Tried to call this morning. No answer.  Pt needs to go to ED immediately and if able to reach will advise so!

## 2014-07-17 NOTE — ED Notes (Signed)
Reports she cut her left foot near achilles tendon while shaving leg.  Pain increases w/activity Alert, no signs of acute distress.

## 2014-07-21 ENCOUNTER — Ambulatory Visit: Payer: Medicare Other | Admitting: Family Medicine

## 2014-07-22 ENCOUNTER — Encounter: Payer: Self-pay | Admitting: Physician Assistant

## 2014-07-22 ENCOUNTER — Ambulatory Visit (INDEPENDENT_AMBULATORY_CARE_PROVIDER_SITE_OTHER): Payer: Medicare Other | Admitting: Physician Assistant

## 2014-07-22 VITALS — BP 130/60 | HR 55 | Temp 97.7°F | Resp 18 | Wt 194.0 lb

## 2014-07-22 DIAGNOSIS — G8929 Other chronic pain: Secondary | ICD-10-CM

## 2014-07-22 DIAGNOSIS — R109 Unspecified abdominal pain: Secondary | ICD-10-CM

## 2014-07-22 MED ORDER — HYDROCODONE-ACETAMINOPHEN 10-325 MG PO TABS: 1.0000 | ORAL_TABLET | Freq: Four times a day (QID) | ORAL | Status: DC | PRN

## 2014-07-23 NOTE — Progress Notes (Signed)
Patient ID: Dorothy Dyer MRN: [id number], DOB: 02-03-1976, 39 y.o. Date of Encounter: @  Chief Complaint:  Chief Complaint  Patient presents with  . stomach problem    she start with diarrhea3 days ago and constispetion     HPI: 39 y.o. year old female  presents with above.   I reviewed her last 2 office visit notes with Dr. Tanya Nones since I last saw her. Patient states that even on the prednisone she started having diarrhea again. Says that she had severe diarrhea Wednesday through Friday and was in the bathroom constantly nonstop. Says that Saturday she started having severe abdominal pain. Now is actually having problems with constipation. Says that she is very frustrated and tired of dealing with this pain. Says that the medication she was on in the past Vicodin 10 mg 5 times a day was working. She is frustrated that we keep trying different treatment and will not just give her the medication that she knows work and keeps her symptoms controlled. Says that she cannot deal with this pain.  Also says that she went to the urgent care. Says that last Wednesday when shaving her legs she got a cut on the back of her lower leg where the Achilles tendon comes in. Says that she put some Band-Aids on it and kept going and didn't think much about it. However when she went to take the Band-Aids off her skin tore and she had bleeding and went to the urgent care. Says that the urgent care glued the site and gave her a tetanus shot because she was concerned that that was out of date. Says that she was told again that they had never seen skin bleed and tear so easily.  Today her son is with her who has severe autism. Throughout the visit he is yelling and banging his head and kicking his legs. Throughout the visit she is having to keep her hands on him and at times physically hold him down to prevent him from injuring himself, me and her.   Past Medical History  Diagnosis Date  . Thyroid  disease   . Cystitis   . Anemia   . Anxiety   . Arthritis   . Clotting disorder     undetermined  . Acid reflux   . Neuromuscular disorder   . Hypertension   . Osteoporosis   . 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 ALPRAZolam (XANAX) 0.5 MG tablet Take 0.5 mg by mouth 3 (three) times daily as needed for anxiety.    Marland Kitchen aspirin-acetaminophen-caffeine (EXCEDRIN MIGRAINE) 250-250-65 MG per tablet Take 1 tablet by mouth every 6 (six) hours as needed for headache.    . Chlorpheniramine-DM (CORICIDIN HBP COUGH/COLD PO) Take 1 tablet by mouth as needed (cough and cold like symptoms).    . furosemide (LASIX) 40 MG tablet Take 40 mg by mouth daily as needed for fluid.    Marland Kitchen HYDROcodone-acetaminophen (NORCO) 5-325 MG per tablet Take 1 tablet by mouth every 4 (four) hours as needed. 60 tablet 0  . levonorgestrel-ethinyl estradiol (ENPRESSE,TRIVORA) tablet Take 1 tablet by mouth daily.    Marland Kitchen levothyroxine (SYNTHROID, LEVOTHROID) 137 MCG tablet Take 1 tablet (137 mcg total) by mouth daily before breakfast. 30 tablet 3  . metoprolol succinate (TOPROL-XL) 50 MG 24 hr tablet Take 1 tablet (50 mg total) by mouth daily. Take with or immediately following  a meal. 90 tablet 3  . nortriptyline (PAMELOR) 75 MG capsule Take 1 capsule (75 mg total) by mouth at bedtime. 30 capsule 5  . ondansetron (ZOFRAN) 4 MG tablet Take 1 tablet (4 mg total) by mouth every 8 (eight) hours as needed for nausea or vomiting. 20 tablet 0  . oxyCODONE-acetaminophen (PERCOCET/ROXICET) 5-325 MG per tablet Take 1 tablet by mouth every 6 (six) hours as needed for severe pain. 60 tablet 0  . polyethylene glycol (MIRALAX / GLYCOLAX) packet Take 17 g by mouth daily as needed.    . potassium chloride SA (K-DUR,KLOR-CON) 20 MEQ tablet Take 20 mEq by mouth 2 (two) times daily.    . predniSONE (DELTASONE) 20 MG tablet Take 2 daily for 5 days then 1  daily for 4 days 14 tablet 0  . predniSONE (DELTASONE) 20 MG tablet Take 3 tablets (60 mg total) by mouth daily with breakfast. 60 tablet 0  . pregabalin (LYRICA) 225 MG capsule Take 1 capsule (225 mg total) by mouth 2 (two) times daily. 60 capsule 3  . RABEprazole (ACIPHEX) 20 MG tablet Take 1 tablet (20 mg total) by mouth 2 (two) times daily. 60 tablet 5  . rizatriptan (MAXALT-MLT) 10 MG disintegrating tablet Take 1 tablet by mouth as needed.  0  . simethicone (MYLICON) 125 MG chewable tablet Chew 125 mg by mouth every 6 (six) hours as needed for flatulence.    . sucralfate (CARAFATE) 1 G tablet Take 1 tablet by mouth 4 (four) times daily.  3  . topiramate (TOPAMAX) 50 MG tablet Take 50 mg by mouth at bedtime.    . traMADol (ULTRAM) 50 MG tablet Take 1 tablet (50 mg total) by mouth every 6 (six) hours as needed. 30 tablet 0  . Vitamin D, Ergocalciferol, (DRISDOL) 50000 UNITS CAPS capsule Take 1 capsule (50,000 Units total) by mouth every 7 (seven) days. 12 capsule 0   No facility-administered medications prior to visit.    Allergies:  Allergies  Allergen Reactions  . Ibuprofen Nausea Only    Bad stomach pains  . Toradol [Ketorolac Tromethamine] Anxiety  . Aspirin Hives  . Cleocin [Clindamycin Hcl] Itching and Rash    History   Social History  . Marital Status: Divorced    Spouse Name: N/A  . Number of Children: N/A  . Years of Education: N/A   Occupational History  . Not on file.   Social History Main Topics  . Smoking status: Current Every Day Smoker -- 0.50 packs/day    Types: Cigarettes  . Smokeless tobacco: Never Used  . Alcohol Use: 6.0 oz/week    10 Shots of liquor per week  . Drug Use: No  . Sexual Activity: Yes    Birth Control/ Protection: Pill   Other Topics Concern  . Not on file   Social History Narrative    Family History  Problem Relation Age of Onset  . Depression Mother   . Diabetes Mother   . Hyperlipidemia Mother   . Depression Father   .  Hearing loss Father   . Hyperlipidemia Father   . Hypertension Father   . Learning disabilities Father   . Mental illness Father   . Asthma Brother   . Alcohol abuse Maternal Grandmother   . Arthritis Maternal Grandmother   . Cancer Maternal Grandmother   . Cancer Maternal Grandfather   . Mental illness Paternal Grandmother   . Heart disease Paternal Grandfather   . Stroke Paternal Grandfather  Review of Systems:  See HPI for pertinent ROS. All other ROS negative.    Physical Exam: Blood pressure 130/60, pulse 55, temperature 97.7 F (36.5 C), temperature source Oral, resp. rate 18, weight 194 lb (87.998 kg), last menstrual period 07/12/2014., Body mass index is 36.07 kg/(m^2). General: Appears in no acute distress. Neck: Supple. No thyromegaly. No lymphadenopathy. Lungs: Clear bilaterally to auscultation without wheezes, rales, or rhonchi. Breathing is unlabored. Heart: RRR with S1 S2. No murmurs, rubs, or gallops. Abdomen: unable to examine, as her son is with her and we have to hold to him through entire visit Musculoskeletal:  Strength and tone normal for age. Extremities/Skin:Posterior aspect of lower leg, just above ankle--scabbed, appears to be healing. No drainage, no surrounding erythema, no sign of infection. No active bleeding. Wound closure present.  Neuro: Alert and oriented X 3. Moves all extremities spontaneously. Gait is normal. CNII-XII grossly in tact. Psych:  Responds to questions appropriately with a normal affect.     ASSESSMENT AND PLAN:  39 y.o. year old female with  1. Chronic abdominal pain I went ahead and gave her some Vicodin 10mg , which she states controlled her pain in the past.  She has appt with GI in near future. Hopefully they can come up with a more certain diagnosis/treatment plan.  - HYDROcodone-acetaminophen (NORCO) 10-325 MG per tablet; Take 1 tablet by mouth every 6 (six) hours as needed.  Dispense: 90 tablet; Refill:  0   Signed, [name], [place], [facility] 07/23/2014 8:08 AM

## 2014-07-27 ENCOUNTER — Other Ambulatory Visit: Payer: Self-pay | Admitting: Family Medicine

## 2014-07-27 ENCOUNTER — Telehealth: Payer: Self-pay | Admitting: Family Medicine

## 2014-07-27 MED ORDER — ONDANSETRON HCL 4 MG PO TABS: 4.0000 mg | ORAL_TABLET | Freq: Three times a day (TID) | ORAL | Status: DC | PRN

## 2014-07-27 MED ORDER — NYSTATIN 100000 UNIT/ML MT SUSP: 5.0000 mL | Freq: Four times a day (QID) | OROMUCOSAL | Status: DC

## 2014-07-27 NOTE — Telephone Encounter (Signed)
Has developed thrush in mouth.  Has had before.  Get it because of steroids she takes for her Chrohn's.  Can we call something in for her?

## 2014-07-27 NOTE — Telephone Encounter (Signed)
Nystatin swih and swallow 5 ml poqid for 7 days

## 2014-07-27 NOTE — Telephone Encounter (Signed)
Rx to pharmacy and pt aware 

## 2014-07-27 NOTE — Telephone Encounter (Signed)
Medication called/sent to requested pharmacy  

## 2014-07-28 ENCOUNTER — Telehealth: Payer: Self-pay | Admitting: *Deleted

## 2014-07-28 NOTE — Telephone Encounter (Signed)
Received call from patient at 4:55pm.   Reports that she was given Nystatin for possible thrush. Reports that she woke up this morning with swelling in her mouth and throat, and she has large swollen glands. Reports that she thinks she may be having an allergic reaction to the Nystatin. Also reports that she is having increased fatigue and confusion.   Advised that she needs to be seen immediately. Advised to go to Urgent Care or ED for immediate eval.   Patient reports that she will attempt to go to UC because she has severely autistic child that she can not leave with just anyone.   MD made aware.

## 2014-08-04 ENCOUNTER — Ambulatory Visit: Payer: Medicare Other | Admitting: Family Medicine

## 2014-08-07 ENCOUNTER — Ambulatory Visit: Payer: Medicare Other | Admitting: Family Medicine

## 2014-08-12 ENCOUNTER — Ambulatory Visit: Payer: Medicare Other | Admitting: Gynecology

## 2014-08-13 ENCOUNTER — Ambulatory Visit: Payer: Medicare Other | Admitting: Family Medicine

## 2014-08-21 ENCOUNTER — Ambulatory Visit: Payer: Medicare Other | Admitting: Gynecology

## 2014-08-27 ENCOUNTER — Telehealth: Payer: Self-pay | Admitting: Family Medicine

## 2014-08-27 DIAGNOSIS — R109 Unspecified abdominal pain: Principal | ICD-10-CM

## 2014-08-27 DIAGNOSIS — G8929 Other chronic pain: Secondary | ICD-10-CM

## 2014-08-27 NOTE — Telephone Encounter (Signed)
?   OK to Refill  

## 2014-08-27 NOTE — Telephone Encounter (Signed)
Ok, I would also like records from [facility].

## 2014-08-27 NOTE — Telephone Encounter (Signed)
Patient would like refill on her hydrocodone [phone] if any questions

## 2014-08-28 MED ORDER — HYDROCODONE-ACETAMINOPHEN 10-325 MG PO TABS: 1.0000 | ORAL_TABLET | Freq: Four times a day (QID) | ORAL | Status: DC | PRN

## 2014-08-28 NOTE — Telephone Encounter (Signed)
RX printed, left up front and patient aware to pick up after 2 today and will sign to get records when she comes in to get rx.

## 2014-09-01 ENCOUNTER — Telehealth: Payer: Self-pay | Admitting: Family Medicine

## 2014-09-01 MED ORDER — ALPRAZOLAM 0.5 MG PO TABS: 0.5000 mg | ORAL_TABLET | Freq: Three times a day (TID) | ORAL | Status: DC | PRN

## 2014-09-01 NOTE — Telephone Encounter (Signed)
Ok to refill??  Last office visit 07/22/2014.  Of note, patient stated at that time that she could not take Xanax D/T increase somnolence.

## 2014-09-01 NOTE — Telephone Encounter (Signed)
Patient calling to get rx for her xanax  [phone]

## 2014-09-01 NOTE — Telephone Encounter (Signed)
Medication called to pharmacy.  Appointment scheduled.  

## 2014-09-01 NOTE — Telephone Encounter (Signed)
Ok, but ntbs

## 2014-09-11 ENCOUNTER — Encounter: Payer: Self-pay | Admitting: Gynecology

## 2014-09-11 ENCOUNTER — Encounter (INDEPENDENT_AMBULATORY_CARE_PROVIDER_SITE_OTHER): Payer: Self-pay | Admitting: Gynecology

## 2014-09-11 NOTE — Progress Notes (Signed)
   Below show

## 2014-09-17 ENCOUNTER — Telehealth: Payer: Self-pay | Admitting: Family Medicine

## 2014-09-17 ENCOUNTER — Encounter: Payer: Self-pay | Admitting: Family Medicine

## 2014-09-17 ENCOUNTER — Ambulatory Visit (INDEPENDENT_AMBULATORY_CARE_PROVIDER_SITE_OTHER): Payer: Medicare Other | Admitting: Family Medicine

## 2014-09-17 VITALS — BP 110/68 | HR 98 | Temp 98.0°F | Resp 14 | Ht 61.5 in | Wt 204.0 lb

## 2014-09-17 DIAGNOSIS — K5289 Other specified noninfective gastroenteritis and colitis: Secondary | ICD-10-CM

## 2014-09-17 DIAGNOSIS — R600 Localized edema: Secondary | ICD-10-CM | POA: Diagnosis not present

## 2014-09-17 MED ORDER — METHYLPREDNISOLONE ACETATE 40 MG/ML IJ SUSP
60.0000 mg | Freq: Once | INTRAMUSCULAR | Status: AC
  Administered 2014-09-17: 60 mg via INTRAMUSCULAR

## 2014-09-17 MED ORDER — ONDANSETRON HCL 4 MG PO TABS: 4.0000 mg | ORAL_TABLET | Freq: Three times a day (TID) | ORAL | Status: DC | PRN

## 2014-09-17 MED ORDER — BUMETANIDE 2 MG PO TABS: 2.0000 mg | ORAL_TABLET | Freq: Every day | ORAL | Status: DC

## 2014-09-17 NOTE — Addendum Note (Signed)
Addended by: Legrand RamsWILLIS, Aamari West B on: 09/17/2014 03:40 PM   Modules accepted: Orders

## 2014-09-17 NOTE — Telephone Encounter (Signed)
Pt wanted to discuss her Xanax with you today but visit sidetracked by other issues.  Xanax was reason for visit initially.  Says need TID PRN dosing, especially now that her Autistic son is out of school.  Needs refill.  Please advise?

## 2014-09-17 NOTE — Telephone Encounter (Signed)
Ok with 60 pills. Per month.  3 times a day will lead to rapid dependency.

## 2014-09-17 NOTE — Progress Notes (Signed)
Subjective:    Patient ID: Dorothy Dyer, female    DOB: 09/20/1975, 39 y.o.   MRN: [id number]  HPI 06/23/14 I have reviewed the patient's extensive past medical history. Patient has had chronic gastrointestinal problems. Patient had a gastric bypass. She then developed a small bowel obstruction. She's had numerous colonoscopies, endoscopies, ERCPs to try to determine the root cause of the patient's chronic abdominal pain. She has been diagnosed with dysmotility, irritable bowel syndrome, and chronic abdominal pain.  I reviewed the recent office visit here at my office with my physician assistant. Shortly thereafter she developed 15/10 lower abdominal pain complicated by diarrhea and nausea and vomiting. She went to the emergency room concerned that she had a bowel obstruction. CT of the abdomen and pelvis there revealed intestinal wall thickening and inflammation consistent with enteritis in the duodenum and small intestine either infectious versus inflammation. She was treated empirically for colitis with Cipro and Flagyl and was given narcotic pain medication and nausea medicine. Her pain is no better. She continues to have diarrhea nausea and vomiting. Her weight today is 14 pounds heavier than her last office visit. I weighed the patient personally and I know that the weight is correct today. She tells me that the weight is wrong. She is also concerned by a wound on her anterior right shin. There is a 1.5 cm erythematous central ulcer that is weeping clear serous fluid. She has +1 pitting edema in her legs and chronic venous insufficiency and venous stasis dermatitis on the leg. The wound was caused by a small trauma over 2 weeks ago however it will not heal due to the weeping edema.  Of note, the patient was declined by local gastroenterologist.  At that time, my plan was:  I am concerned the patient may have noninfectious colitis from possibly inflammatory bowel disease such as Crohn's disease.  She isn't moderate to severe pain. She is requesting more pain medication. I will try empirically treating the patient for inflammatory bowel disease with a prednisone taper. I explained this to the patient. This is not a diagnosis but honestly empiric therapy only. Recheck next week. If symptoms improve, she may benefit from chronic immunosuppression. I treated the wound on her right leg is a venous stasis ulcer by patient the patient in an Radio broadcast assistant. I recommended she elevate her legs is much as possible. I will recheck her next week to replace the Unna boot. I will also refer the patient to gastroenterology at [facility].  06/29/14 Patient had been on antibiotics for over 4 days when I saw her. The pain was worsening. After beginning the prednisone, the patient's symptoms improved dramatically. She states that the severe abdominal pain has now improved. She is now at her baseline chronic abdominal pain. The nausea has improved. She is currently on 20 mg a day of prednisone. The question is if she responding to the prednisone or a she responding to the antibiotics. She is also taking Ultram sparingly for abdominal pain. The wound on her right shin is much improved. It is now 5 mm x 5 mm. The edema in her legs is much improved.  However the patient has lost substantial weight. She states that she is taking fluids and eating normally. She denies failure to tolerate by mouth. However she has lost almost 18 pounds. Patient attributes this to stopping Lyrica on her own and also taking more fluid pills.  At that time, my plan was: Venous stasis ulcer is improving.  I replaced the [employer] boot. Recheck in one week. Given the patient's rapid weight loss, I encouraged her to drink more fluids and to discontinue her fluid pill to avoid dehydration. I will recheck her weight neck suite. I will also check a TSH to make sure her levothyroxine is appropriately dosed. I do believe the patient may have inflammatory bowel  disease. I'm willing to begin to wean her down on the prednisone is much as possible. Decrease prednisone to 10 mg a day and recheck in one week. I will wean the patient off prednisone as tolerated. She is scheduled to see a gastroenterologist at [facility] on May 18. I also made a referral to a pain clinic. Recheck next week.  07/09/14 Patient's sedimentation rate last week was 4 which is normal. Her white blood cell count was stable at 12 and this was in the clinical setting of glucocorticoids. Given the fact the diagnosis was in question, we quickly wean the patient down to 10 mg of prednisone last week. She also stopped Cipro and Flagyl. Shortly thereafter her abdominal pain began to worsen. She always has a baseline abdominal pain however it has now risen to 10 on a scale of 10. She reports daily nausea. She reports inability to tolerate by mouth. She is still passing flatus. Her last bowel movement was yesterday. Patient was seen yesterday in this clinic by my physician's assistant. Her abdominal pain worsened. At that time Dorothy Dyer resumed 40 mg of prednisone a day and also gave her a prescription for oxycodone. She is here today for follow-up.  She continues to lose weight.  At that time, my plan was: Wt Readings from Last 3 Encounters:  07/22/14 194 lb (87.998 kg)  07/13/14 190 lb (86.183 kg)  07/09/14 190 lb (86.183 kg)   It is possible the patient does have inflammatory bowel disease such as Crohn's, and when I quickly weaned her down on the steroids, the colitis resumed. If that is the case, high-dose steroids should calm down the patient quickly. The other possibility is this is infectious colitis that resumed after the patient discontinue the antibiotics. However she took a full 10 day course. Furthermore she is afebrile. I checked a stat CBC, and her white blood cell count was 13 which is essentially unchanged. Therefore, I believe infectious colitis is unlikely. The other possibility would be a  bowel obstruction stemming from her numerous intestinal surgeries. I will send the patient for abdominal x-rays immediately to rule out signs of bowel obstruction. If there is evidence of a bowel obstruction on x-rays, I will recommend that she go to the emergency room for a CT scan and management of a bowel obstruction. Another possibility would be malingering.  I will give the patient 80 mg of Depo-Medrol IM immediately to try to treat a possible Crohn's flare and I will send her immediately to get x-rays of the abdomen. If the x-rays are negative, I will treat the patient with high-dose steroids. I will slowly wean the patient off steroids, 5 mg per week, until she is seen by GI. Obviously if the x-rays are abnormal she would go to the emergency room. If the pain worsens she is to go to the emergency room for a CT scan of the abdomen.  07/13/14 X-ray revealed no evidence of a bowel obstruction. She's been taking 60 mg of prednisone over the weekend per day. She is here today for follow-up. Patient is doing much better since increasing the dose of prednisone.  Her pain is back to her baseline. She looks much better today. Her weight has stabilized. She is no longer losing weight. Now she is complaining of constipation however I attribute this to the narcotics she is taking. At that time, my plan was: I believe the patient has inflammatory bowel disease and I believe she has Crohn's. Therefore I'm going to try to decrease the patient on the prednisone by 10 mg every week. I want her to start 50 mg of prednisone by mouth daily for the next week and then decrease to 40 mg by mouth daily the second week and then 30 mg by mouth daily the third week and so on until she sees gastroenterology. I have warned the patient about prolonged steroid use and the risk of iatrogenic Cushing's disease, osteoporosis, weight gain, and diabetes. She understands and would like to try to wean off the steroids as her body will tolerate.  Ultimately, I hope that GI can help [place] with a definitive diagnosis through endoscopy and help [place] find a maintenance medication that will maintain remission. I recommended that she use MiraLAX to help manage the constipation. Also gave her samples of movantik 25 mg by mouth daily as needed for narcotic induced constipation. I would like to see the patient back in follow-up after she sees the gastroenterologist later this month.  I gave the patient a prescription for 60 mg of prednisone a day but I explained to the patient how I want her to take it. She understands that she is to decrease the dose to 50 mg a day and then by 10 mg each week until she sees gastroenterology.  09/17/14 DNKA last appt.  Saw GI at [facility].  Note is in EPIC.  They are trying to wean her off prednisone and schedule her for MRI enterography.  Patient was doing well on prednisone patient discontinued prednisone last week. Beginning 2 days ago she developed severe persistent watery diarrhea, increasing frequency of sharp crampy upper abdominal pain, and persistent daily nausea. Seems to coincide with weaning off the prednisone. She has the MRI scheduled for next week. She also has +1 pitting edema in both legs that is unresponsive to Lasix. She is not wearing compression stockings.   Past Medical History  Diagnosis Date  . Thyroid disease   . Cystitis   . Anemia   . Anxiety   . Arthritis   . Clotting disorder     undetermined  . Acid reflux   . Neuromuscular disorder   . Hypertension   . Osteoporosis   . 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 Cholecystectomy    . Small intestine surgery  2004    exploratory  . Gastric bypass  05/2001   Current Outpatient Prescriptions on File Prior to Visit  Medication Sig Dispense Refill  . ALPRAZolam (XANAX) 0.5 MG tablet Take 1 tablet (0.5 mg total) by mouth 3 (three) times daily as needed for anxiety. 30 tablet 0  .  aspirin-acetaminophen-caffeine (EXCEDRIN MIGRAINE) 250-250-65 MG per tablet Take 1 tablet by mouth every 6 (six) hours as needed for headache.    . Chlorpheniramine-DM (CORICIDIN HBP COUGH/COLD PO) Take 1 tablet by mouth as needed (cough and cold like symptoms).    . furosemide (LASIX) 40 MG tablet Take 40 mg by mouth daily as needed for fluid.    Marland Kitchen HYDROcodone-acetaminophen (NORCO) 10-325 MG per tablet Take 1 tablet by mouth every 6 (six) hours  as needed. 90 tablet 0  . levonorgestrel-ethinyl estradiol (ENPRESSE,TRIVORA) tablet Take 1 tablet by mouth daily.    Marland Kitchen levothyroxine (SYNTHROID, LEVOTHROID) 137 MCG tablet Take 1 tablet (137 mcg total) by mouth daily before breakfast. 30 tablet 3  . metoprolol succinate (TOPROL-XL) 50 MG 24 hr tablet Take 1 tablet (50 mg total) by mouth daily. Take with or immediately following a meal. 90 tablet 3  . nortriptyline (PAMELOR) 75 MG capsule Take 1 capsule (75 mg total) by mouth at bedtime. 30 capsule 5  . nystatin (MYCOSTATIN) 100000 UNIT/ML suspension Take 5 mLs (500,000 Units total) by mouth 4 (four) times daily. Swish and swallow 120 mL 0  . ondansetron (ZOFRAN) 4 MG tablet Take 1 tablet (4 mg total) by mouth every 8 (eight) hours as needed for nausea or vomiting. 20 tablet 0  . oxyCODONE-acetaminophen (PERCOCET/ROXICET) 5-325 MG per tablet Take 1 tablet by mouth every 6 (six) hours as needed for severe pain. 60 tablet 0  . polyethylene glycol (MIRALAX / GLYCOLAX) packet Take 17 g by mouth daily as needed.    . potassium chloride SA (K-DUR,KLOR-CON) 20 MEQ tablet Take 20 mEq by mouth 2 (two) times daily.    . predniSONE (DELTASONE) 20 MG tablet Take 2 daily for 5 days then 1 daily for 4 days 14 tablet 0  . predniSONE (DELTASONE) 20 MG tablet Take 3 tablets (60 mg total) by mouth daily with breakfast. 60 tablet 0  . pregabalin (LYRICA) 225 MG capsule Take 1 capsule (225 mg total) by mouth 2 (two) times daily. 60 capsule 3  . RABEprazole (ACIPHEX) 20 MG tablet  Take 1 tablet (20 mg total) by mouth 2 (two) times daily. 60 tablet 5  . rizatriptan (MAXALT-MLT) 10 MG disintegrating tablet Take 1 tablet by mouth as needed.  0  . simethicone (MYLICON) 125 MG chewable tablet Chew 125 mg by mouth every 6 (six) hours as needed for flatulence.    . sucralfate (CARAFATE) 1 G tablet Take 1 tablet by mouth 4 (four) times daily.  3  . topiramate (TOPAMAX) 50 MG tablet Take 50 mg by mouth at bedtime.    . traMADol (ULTRAM) 50 MG tablet Take 1 tablet (50 mg total) by mouth every 6 (six) hours as needed. 30 tablet 0  . Vitamin D, Ergocalciferol, (DRISDOL) 50000 UNITS CAPS capsule Take 1 capsule (50,000 Units total) by mouth every 7 (seven) days. 12 capsule 0   No current facility-administered medications on file prior to visit.   Allergies  Allergen Reactions  . Ibuprofen Nausea Only    Bad stomach pains  . Toradol [Ketorolac Tromethamine] Anxiety  . Aspirin Hives  . Cleocin [Clindamycin Hcl] Itching and Rash   History   Social History  . Marital Status: Divorced    Spouse Name: N/A  . Number of Children: N/A  . Years of Education: N/A   Occupational History  . Not on file.   Social History Main Topics  . Smoking status: Current Every Day Smoker -- 0.50 packs/day    Types: Cigarettes  . Smokeless tobacco: Never Used  . Alcohol Use: 6.0 oz/week    10 Shots of liquor per week  . Drug Use: No  . Sexual Activity: Yes    Birth Control/ Protection: Pill   Other Topics Concern  . Not on file   Social History Narrative      Review of Systems  All other systems reviewed and are negative.      Objective:  Physical Exam  Constitutional: She appears well-developed and well-nourished. No distress.  Cardiovascular: Normal rate, regular rhythm and normal heart sounds.   No murmur heard. Pulmonary/Chest: Effort normal and breath sounds normal.  Abdominal: Soft. Bowel sounds are normal. She exhibits no distension. There is no tenderness. There is  no rebound and no guarding.  Musculoskeletal: She exhibits no edema.  Skin: She is not diaphoretic. No erythema.          Assessment & Plan:  Edema of both legs - Plan: bumetanide (BUMEX) 2 MG tablet  Other noninfectious gastroenteritis  Discontinue Lasix and start the patient on Bumex 2 mg by mouth daily. Also gave the patient a prescription for compression stockings 15-20 mmHg that are knee-high and instructed her to wear those on a daily basis to help control the swelling. I will give the patient 60 mg of Depo-Medrol can control the diarrhea and try to calm some of the exacerbation basically to try to buy some time until the patient can obtain MRI next week. I would prefer her not to be on prednisone at the time the MRI so that we can get a definitive diagnosis. Patient agrees with this plan. If symptoms worsen she knows to go to the emergency room

## 2014-09-18 MED ORDER — ALPRAZOLAM 0.5 MG PO TABS: 0.5000 mg | ORAL_TABLET | Freq: Two times a day (BID) | ORAL | Status: DC | PRN

## 2014-09-18 NOTE — Telephone Encounter (Signed)
rx called into pharmacy

## 2014-09-25 ENCOUNTER — Telehealth: Payer: Self-pay | Admitting: Family Medicine

## 2014-09-25 DIAGNOSIS — R109 Unspecified abdominal pain: Principal | ICD-10-CM

## 2014-09-25 DIAGNOSIS — G8929 Other chronic pain: Secondary | ICD-10-CM

## 2014-09-25 MED ORDER — HYDROCODONE-ACETAMINOPHEN 10-325 MG PO TABS: 1.0000 | ORAL_TABLET | Freq: Four times a day (QID) | ORAL | Status: DC | PRN

## 2014-09-25 NOTE — Telephone Encounter (Signed)
?   OK to Refill #90 that is due - No 90 day supply

## 2014-09-25 NOTE — Telephone Encounter (Signed)
ok 

## 2014-09-25 NOTE — Telephone Encounter (Signed)
RX printed, left up front and patient aware to pick up - pt did not want 90 day supply just the 90 pills she normally gets.

## 2014-09-25 NOTE — Telephone Encounter (Signed)
Patient calling to get rx for her hydrocodone 90 day supply and also wanted to let dr pickard know that she has mri scheduled for 10-05-14 [phone] if any questions

## 2014-10-12 HISTORY — PX: ESOPHAGOGASTRODUODENOSCOPY: SHX1529

## 2014-10-19 ENCOUNTER — Other Ambulatory Visit: Payer: Self-pay | Admitting: Family Medicine

## 2014-10-19 ENCOUNTER — Encounter: Payer: Self-pay | Admitting: Family Medicine

## 2014-10-19 MED ORDER — TOPIRAMATE 50 MG PO TABS: 50.0000 mg | ORAL_TABLET | Freq: Every day | ORAL | Status: DC

## 2014-10-19 NOTE — Telephone Encounter (Signed)
ok 

## 2014-10-19 NOTE — Telephone Encounter (Signed)
Medication called to pharmacy. 

## 2014-10-19 NOTE — Telephone Encounter (Signed)
Medication refilled per protocol. 

## 2014-10-19 NOTE — Telephone Encounter (Signed)
Ok to refill??  Last office visit 09/17/2014.  Last refill 09/18/2014.

## 2014-10-28 ENCOUNTER — Telehealth: Payer: Self-pay | Admitting: Family Medicine

## 2014-10-28 ENCOUNTER — Other Ambulatory Visit: Payer: Medicare Other

## 2014-10-28 DIAGNOSIS — E038 Other specified hypothyroidism: Secondary | ICD-10-CM

## 2014-10-28 DIAGNOSIS — R109 Unspecified abdominal pain: Principal | ICD-10-CM

## 2014-10-28 DIAGNOSIS — G8929 Other chronic pain: Secondary | ICD-10-CM

## 2014-10-28 LAB — TSH: TSH: 0.262 u[IU]/mL — ABNORMAL LOW (ref 0.350–4.500)

## 2014-10-28 NOTE — Telephone Encounter (Signed)
?   OK to Refill  

## 2014-10-28 NOTE — Telephone Encounter (Signed)
Patient needs rx for her hydrocodone  [phone]

## 2014-10-28 NOTE — Telephone Encounter (Signed)
Patient says that after going to [facility], the bills are now starting to pile in because they did not take her insurance,woudl like to know if we can refer her to [facility] instead because they take her insurance  [phone]

## 2014-10-29 ENCOUNTER — Other Ambulatory Visit: Payer: Self-pay | Admitting: Family Medicine

## 2014-10-29 MED ORDER — LEVOTHYROXINE SODIUM 125 MCG PO TABS: 125.0000 ug | ORAL_TABLET | Freq: Every day | ORAL | Status: DC

## 2014-10-29 MED ORDER — HYDROCODONE-ACETAMINOPHEN 10-325 MG PO TABS: 1.0000 | ORAL_TABLET | Freq: Four times a day (QID) | ORAL | Status: DC | PRN

## 2014-10-29 NOTE — Telephone Encounter (Signed)
Pt aware to pu after 2pm

## 2014-10-29 NOTE — Telephone Encounter (Signed)
RX printed & [facility]

## 2014-10-29 NOTE — Telephone Encounter (Signed)
ok 

## 2014-11-02 ENCOUNTER — Telehealth: Payer: Self-pay | Admitting: Family Medicine

## 2014-11-02 NOTE — Telephone Encounter (Signed)
Patient calling to say that she has yeast in her mouth and would like to know if something can be called in for this if possible  [facility]

## 2014-11-02 NOTE — Telephone Encounter (Signed)
Nystatin swish and swallo 1 tsp poqid for 7-10 days.

## 2014-11-03 MED ORDER — NYSTATIN 100000 UNIT/ML MT SUSP: 5.0000 mL | Freq: Four times a day (QID) | OROMUCOSAL | Status: DC

## 2014-11-03 NOTE — Telephone Encounter (Signed)
Medication called/sent to requested pharmacy  

## 2014-11-18 ENCOUNTER — Other Ambulatory Visit: Payer: Self-pay | Admitting: Family Medicine

## 2014-11-18 ENCOUNTER — Ambulatory Visit: Payer: Medicare Other | Admitting: Physician Assistant

## 2014-11-18 NOTE — Telephone Encounter (Signed)
?   OK to Refill  

## 2014-11-19 ENCOUNTER — Other Ambulatory Visit: Payer: Self-pay | Admitting: Family Medicine

## 2014-11-19 ENCOUNTER — Ambulatory Visit (INDEPENDENT_AMBULATORY_CARE_PROVIDER_SITE_OTHER): Payer: Medicare Other | Admitting: Physician Assistant

## 2014-11-19 ENCOUNTER — Encounter: Payer: Self-pay | Admitting: Physician Assistant

## 2014-11-19 VITALS — BP 114/70 | HR 84 | Temp 98.7°F | Resp 18 | Wt 196.0 lb

## 2014-11-19 DIAGNOSIS — R350 Frequency of micturition: Secondary | ICD-10-CM

## 2014-11-19 DIAGNOSIS — R1030 Lower abdominal pain, unspecified: Secondary | ICD-10-CM | POA: Diagnosis not present

## 2014-11-19 LAB — URINALYSIS, MICROSCOPIC ONLY
Casts: NONE SEEN [LPF]
Crystals: NONE SEEN [HPF]
RBC / HPF: NONE SEEN RBC/HPF (ref <=2)
YEAST: NONE SEEN [HPF]

## 2014-11-19 LAB — URINALYSIS, ROUTINE W REFLEX MICROSCOPIC
BILIRUBIN URINE: NEGATIVE
GLUCOSE, UA: NEGATIVE
Ketones, ur: NEGATIVE
LEUKOCYTES UA: NEGATIVE
Nitrite: NEGATIVE
Specific Gravity, Urine: >1.03 (ref 1.001–1.035)
pH: 5.5 (ref 5.0–8.0)

## 2014-11-19 NOTE — Telephone Encounter (Signed)
ok 

## 2014-11-19 NOTE — Telephone Encounter (Signed)
Medication called to pharmacy. 

## 2014-11-19 NOTE — Progress Notes (Signed)
Patient ID: Dorothy Dyer MRN: [id number], DOB: 05-13-1975, 39 y.o. Date of Encounter: 11/19/2014, 5:33 PM    Chief Complaint:  Chief Complaint  Patient presents with  . c/o poss UTI    c/o groin pain, no dysuria, had same 1 mth ago was UTI     HPI: 39 y.o. year old female presents with above symptoms. Says that 4 weeks ago she was seen by her gynecologist and had UTI and vaginitis and was treated with 2 antibiotics. Says that 2 weeks ago she started having urinary frequency again and has continued to have some frequency and feeling similar to how she was feeling 4 weeks ago so wanted to come in and make sure she does not have UTI causing this. Is having no dysuria. No fevers or chills.     Home Meds:   Outpatient Prescriptions Prior to Visit  Medication Sig Dispense Refill  . ALPRAZolam (XANAX) 0.5 MG tablet TAKE 1 TABLET BY MOUTH TWICE A DAY AS NEEDED FOR ANXIETY 60 tablet 0  . aspirin-acetaminophen-caffeine (EXCEDRIN MIGRAINE) 250-250-65 MG per tablet Take 1 tablet by mouth every 6 (six) hours as needed for headache.    . bumetanide (BUMEX) 2 MG tablet Take 1 tablet (2 mg total) by mouth daily. 30 tablet 0  . Chlorpheniramine-DM (CORICIDIN HBP COUGH/COLD PO) Take 1 tablet by mouth as needed (cough and cold like symptoms).    . furosemide (LASIX) 40 MG tablet Take 40 mg by mouth daily as needed for fluid.    Marland Kitchen HYDROcodone-acetaminophen (NORCO) 10-325 MG per tablet Take 1 tablet by mouth every 6 (six) hours as needed. 90 tablet 0  . levonorgestrel-ethinyl estradiol (ENPRESSE,TRIVORA) tablet Take 1 tablet by mouth daily.    Marland Kitchen levothyroxine (SYNTHROID, LEVOTHROID) 125 MCG tablet Take 1 tablet (125 mcg total) by mouth daily. 90 tablet 3  . metoprolol succinate (TOPROL-XL) 50 MG 24 hr tablet Take 1 tablet (50 mg total) by mouth daily. Take with or immediately following a meal. 90 tablet 3  . nortriptyline (PAMELOR) 75 MG capsule Take 1 capsule (75 mg total) by mouth at bedtime. 30  capsule 5  . nystatin (MYCOSTATIN) 100000 UNIT/ML suspension Take 5 mLs (500,000 Units total) by mouth 4 (four) times daily. Swish and swallow 120 mL 0  . ondansetron (ZOFRAN) 4 MG tablet Take 1 tablet (4 mg total) by mouth every 8 (eight) hours as needed for nausea or vomiting. 20 tablet 0  . oxyCODONE-acetaminophen (PERCOCET/ROXICET) 5-325 MG per tablet Take 1 tablet by mouth every 6 (six) hours as needed for severe pain. 60 tablet 0  . polyethylene glycol (MIRALAX / GLYCOLAX) packet Take 17 g by mouth daily as needed.    . potassium chloride SA (K-DUR,KLOR-CON) 20 MEQ tablet Take 20 mEq by mouth 2 (two) times daily.    . predniSONE (DELTASONE) 20 MG tablet Take 2 daily for 5 days then 1 daily for 4 days 14 tablet 0  . predniSONE (DELTASONE) 20 MG tablet Take 3 tablets (60 mg total) by mouth daily with breakfast. 60 tablet 0  . pregabalin (LYRICA) 225 MG capsule Take 1 capsule (225 mg total) by mouth 2 (two) times daily. 60 capsule 3  . RABEprazole (ACIPHEX) 20 MG tablet Take 1 tablet (20 mg total) by mouth 2 (two) times daily. 60 tablet 5  . rizatriptan (MAXALT-MLT) 10 MG disintegrating tablet Take 1 tablet by mouth as needed.  0  . simethicone (MYLICON) 125 MG chewable tablet Chew 125 mg by mouth  every 6 (six) hours as needed for flatulence.    . sucralfate (CARAFATE) 1 G tablet Take 1 tablet by mouth 4 (four) times daily.  3  . topiramate (TOPAMAX) 50 MG tablet Take 1 tablet (50 mg total) by mouth at bedtime. 30 tablet 2  . traMADol (ULTRAM) 50 MG tablet Take 1 tablet (50 mg total) by mouth every 6 (six) hours as needed. 30 tablet 0  . Vitamin D, Ergocalciferol, (DRISDOL) 50000 UNITS CAPS capsule Take 1 capsule (50,000 Units total) by mouth every 7 (seven) days. 12 capsule 0  . levothyroxine (SYNTHROID, LEVOTHROID) 137 MCG tablet Take 1 tablet (137 mcg total) by mouth daily before breakfast. 30 tablet 3   No facility-administered medications prior to visit.    Allergies:  Allergies    Allergen Reactions  . Ibuprofen Nausea Only    Bad stomach pains  . Toradol [Ketorolac Tromethamine] Anxiety  . Aspirin Hives  . Cleocin [Clindamycin Hcl] Itching and Rash  . Clindamycin Rash      Review of Systems: See HPI for pertinent ROS. All other ROS negative.    Physical Exam: Blood pressure 114/70, pulse 84, temperature 98.7 F (37.1 C), temperature source Oral, resp. rate 18, weight 196 lb (88.905 kg)., Body mass index is 36.44 kg/(m^2). General:  WNWD WF. Appears in no acute distress. Neck: Supple. No thyromegaly. No lymphadenopathy. Lungs: Clear bilaterally to auscultation without wheezes, rales, or rhonchi. Breathing is unlabored. Heart: Regular rhythm. No murmurs, rubs, or gallops. Abdomen: Soft,  non-distended with normoactive bowel sounds. No hepatomegaly. No rebound/guarding. No obvious abdominal masses. She has chronic abdominal pain but has no tenderness with palpation of the suprapubic area. Msk:  Strength and tone normal for age. No costophrenic angle tenderness with percussion. Extremities/Skin: Warm and dry. Neuro: Alert and oriented X 3. Moves all extremities spontaneously. Gait is normal. CNII-XII grossly in tact. Psych:  Responds to questions appropriately with a normal affect.   Results for orders placed or performed in visit on 11/19/14  Urinalysis, Routine w reflex microscopic (not at [facility])  Result Value Ref Range   Color, Urine DARK YELLOW YELLOW   APPearance CLOUDY (A) CLEAR   Specific Gravity, Urine >1.030 1.001 - 1.035   pH 5.5 5.0 - 8.0   Glucose, UA NEGATIVE NEGATIVE   Bilirubin Urine NEGATIVE NEGATIVE   Ketones, ur NEGATIVE NEGATIVE   Hgb urine dipstick TRACE (A) NEGATIVE   Protein, ur TRACE (A) NEGATIVE   Nitrite NEGATIVE NEGATIVE   Leukocytes, UA NEGATIVE NEGATIVE  Urine Microscopic  Result Value Ref Range   WBC, UA 0-5 <=5 WBC/HPF   RBC / HPF NONE SEEN <=2 RBC/HPF   Squamous Epithelial / LPF 0-5 <=5 HPF   Bacteria, UA FEW (A) NONE  SEEN HPF   Crystals NONE SEEN NONE SEEN HPF   Casts NONE SEEN NONE SEEN LPF   Yeast NONE SEEN NONE SEEN HPF     ASSESSMENT AND PLAN:  40 y.o. year old female with  1. Groin pain, unspecified laterality - Urinalysis, Routine w reflex microscopic (not at [facility]) - Urine culture  2. Urinary frequency - Urine culture  Discussed with patient that urinalysis does not indicate UTI but that I will send culture to be 100% sure. We'll follow-up with her only get culture results.  [address] [place], [place], [facility] 11/19/2014 5:33 PM

## 2014-11-20 LAB — URINE CULTURE

## 2014-11-26 ENCOUNTER — Telehealth: Payer: Self-pay | Admitting: Family Medicine

## 2014-11-26 DIAGNOSIS — G8929 Other chronic pain: Secondary | ICD-10-CM

## 2014-11-26 DIAGNOSIS — R109 Unspecified abdominal pain: Principal | ICD-10-CM

## 2014-11-26 MED ORDER — HYDROCODONE-ACETAMINOPHEN 10-325 MG PO TABS: 1.0000 | ORAL_TABLET | Freq: Four times a day (QID) | ORAL | Status: DC | PRN

## 2014-11-26 NOTE — Telephone Encounter (Signed)
Pt also came into check on her urine culture/labs, it did not look like it was reviewed and answered, can you please check it so that I can let her know her results.

## 2014-11-26 NOTE — Telephone Encounter (Signed)
?   OK to Refill  

## 2014-11-26 NOTE — Telephone Encounter (Signed)
Script printed out for provider signature

## 2014-11-26 NOTE — Telephone Encounter (Signed)
Patient calling for rx for her hydrocodone [phone]

## 2014-11-26 NOTE — Telephone Encounter (Signed)
I didn't treat her urine on 9/8 but it looks like contamination.  Ok with refill on pain med.

## 2014-11-30 ENCOUNTER — Other Ambulatory Visit: Payer: Self-pay | Admitting: Physician Assistant

## 2014-11-30 ENCOUNTER — Telehealth: Payer: Self-pay | Admitting: *Deleted

## 2014-11-30 NOTE — Telephone Encounter (Signed)
Received PA determination.   PA approved.   [facility] made aware.

## 2014-11-30 NOTE — Telephone Encounter (Signed)
Refill appropriate and filled per protocol. 

## 2014-11-30 NOTE — Telephone Encounter (Signed)
Received request from pharmacy for PA on Aciphex.  PA submitted.   Dx: K21.9

## 2014-12-15 ENCOUNTER — Encounter: Payer: Medicare Other | Admitting: Family Medicine

## 2014-12-15 ENCOUNTER — Ambulatory Visit: Payer: Medicare Other | Admitting: Family Medicine

## 2014-12-15 NOTE — Progress Notes (Signed)
Subjective:    Patient ID: Dorothy Dyer, female    DOB: Sep 23, 1975, 39 y.o.   MRN: [id number]  HPI 06/23/14 I have reviewed the patient's extensive past medical history. Patient has had chronic gastrointestinal problems. Patient had a gastric bypass. She then developed a small bowel obstruction. She's had numerous colonoscopies, endoscopies, ERCPs to try to determine the root cause of the patient's chronic abdominal pain. She has been diagnosed with dysmotility, irritable bowel syndrome, and chronic abdominal pain.  I reviewed the recent office visit here at my office with my physician assistant. Shortly thereafter she developed 15/10 lower abdominal pain complicated by diarrhea and nausea and vomiting. She went to the emergency room concerned that she had a bowel obstruction. CT of the abdomen and pelvis there revealed intestinal wall thickening and inflammation consistent with enteritis in the duodenum and small intestine either infectious versus inflammation. She was treated empirically for colitis with Cipro and Flagyl and was given narcotic pain medication and nausea medicine. Her pain is no better. She continues to have diarrhea nausea and vomiting. Her weight today is 14 pounds heavier than her last office visit. I weighed the patient personally and I know that the weight is correct today. She tells me that the weight is wrong. She is also concerned by a wound on her anterior right shin. There is a 1.5 cm erythematous central ulcer that is weeping clear serous fluid. She has +1 pitting edema in her legs and chronic venous insufficiency and venous stasis dermatitis on the leg. The wound was caused by a small trauma over 2 weeks ago however it will not heal due to the weeping edema.  Of note, the patient was declined by local gastroenterologist.  At that time, my plan was:  I am concerned the patient may have noninfectious colitis from possibly inflammatory bowel disease such as Crohn's disease.  She isn't moderate to severe pain. She is requesting more pain medication. I will try empirically treating the patient for inflammatory bowel disease with a prednisone taper. I explained this to the patient. This is not a diagnosis but honestly empiric therapy only. Recheck next week. If symptoms improve, she may benefit from chronic immunosuppression. I treated the wound on her right leg is a venous stasis ulcer by patient the patient in an Radio broadcast assistant. I recommended she elevate her legs is much as possible. I will recheck her next week to replace the Unna boot. I will also refer the patient to gastroenterology at [facility].  06/29/14 Patient had been on antibiotics for over 4 days when I saw her. The pain was worsening. After beginning the prednisone, the patient's symptoms improved dramatically. She states that the severe abdominal pain has now improved. She is now at her baseline chronic abdominal pain. The nausea has improved. She is currently on 20 mg a day of prednisone. The question is if she responding to the prednisone or a she responding to the antibiotics. She is also taking Ultram sparingly for abdominal pain. The wound on her right shin is much improved. It is now 5 mm x 5 mm. The edema in her legs is much improved.  However the patient has lost substantial weight. She states that she is taking fluids and eating normally. She denies failure to tolerate by mouth. However she has lost almost 18 pounds. Patient attributes this to stopping Lyrica on her own and also taking more fluid pills.  At that time, my plan was: Venous stasis ulcer is improving.  I replaced the [employer]. Recheck in one week. Given the patient's rapid weight loss, I encouraged her to drink more fluids and to discontinue her fluid pill to avoid dehydration. I will recheck her weight neck suite. I will also check a TSH to make sure her levothyroxine is appropriately dosed. I do believe the patient may have inflammatory bowel  disease. I'm willing to begin to wean her down on the prednisone is much as possible. Decrease prednisone to 10 mg a day and recheck in one week. I will wean the patient off prednisone as tolerated. She is scheduled to see a gastroenterologist at [facility] on May 18. I also made a referral to a pain clinic. Recheck next week.  07/09/14 Patient's sedimentation rate last week was 4 which is normal. Her white blood cell count was stable at 12 and this was in the clinical setting of glucocorticoids. Given the fact the diagnosis was in question, we quickly wean the patient down to 10 mg of prednisone last week. She also stopped Cipro and Flagyl. Shortly thereafter her abdominal pain began to worsen. She always has a baseline abdominal pain however it has now risen to 10 on a scale of 10. She reports daily nausea. She reports inability to tolerate by mouth. She is still passing flatus. Her last bowel movement was yesterday. Patient was seen yesterday in this clinic by my physician's assistant. Her abdominal pain worsened. At that time Shon Hale resumed 40 mg of prednisone a day and also gave her a prescription for oxycodone. She is here today for follow-up.  She continues to lose weight.  At that time, my plan was: Wt Readings from Last 3 Encounters:  11/19/14 196 lb (88.905 kg)  09/17/14 204 lb (92.534 kg)  07/22/14 194 lb (87.998 kg)   It is possible the patient does have inflammatory bowel disease such as Crohn's, and when I quickly weaned her down on the steroids, the colitis resumed. If that is the case, high-dose steroids should calm down the patient quickly. The other possibility is this is infectious colitis that resumed after the patient discontinue the antibiotics. However she took a full 10 day course. Furthermore she is afebrile. I checked a stat CBC, and her white blood cell count was 13 which is essentially unchanged. Therefore, I believe infectious colitis is unlikely. The other possibility would be a  bowel obstruction stemming from her numerous intestinal surgeries. I will send the patient for abdominal x-rays immediately to rule out signs of bowel obstruction. If there is evidence of a bowel obstruction on x-rays, I will recommend that she go to the emergency room for a CT scan and management of a bowel obstruction. Another possibility would be malingering.  I will give the patient 80 mg of Depo-Medrol IM immediately to try to treat a possible Crohn's flare and I will send her immediately to get x-rays of the abdomen. If the x-rays are negative, I will treat the patient with high-dose steroids. I will slowly wean the patient off steroids, 5 mg per week, until she is seen by GI. Obviously if the x-rays are abnormal she would go to the emergency room. If the pain worsens she is to go to the emergency room for a CT scan of the abdomen.  07/13/14 X-ray revealed no evidence of a bowel obstruction. She's been taking 60 mg of prednisone over the weekend per day. She is here today for follow-up. Patient is doing much better since increasing the dose of prednisone.  Her pain is back to her baseline. She looks much better today. Her weight has stabilized. She is no longer losing weight. Now she is complaining of constipation however I attribute this to the narcotics she is taking. At that time, my plan was: I believe the patient has inflammatory bowel disease and I believe she has Crohn's. Therefore I'm going to try to decrease the patient on the prednisone by 10 mg every week. I want her to start 50 mg of prednisone by mouth daily for the next week and then decrease to 40 mg by mouth daily the second week and then 30 mg by mouth daily the third week and so on until she sees gastroenterology. I have warned the patient about prolonged steroid use and the risk of iatrogenic Cushing's disease, osteoporosis, weight gain, and diabetes. She understands and would like to try to wean off the steroids as her body will tolerate.  Ultimately, I hope that GI can help [place] with a definitive diagnosis through endoscopy and help [place] find a maintenance medication that will maintain remission. I recommended that she use MiraLAX to help manage the constipation. Also gave her samples of movantik 25 mg by mouth daily as needed for narcotic induced constipation. I would like to see the patient back in follow-up after she sees the gastroenterologist later this month.  I gave the patient a prescription for 60 mg of prednisone a day but I explained to the patient how I want her to take it. She understands that she is to decrease the dose to 50 mg a day and then by 10 mg each week until she sees gastroenterology.  09/17/14 DNKA last appt.  Saw GI at [facility].  Note is in EPIC.  They are trying to wean her off prednisone and schedule her for MRI enterography.  Patient was doing well on prednisone patient discontinued prednisone last week. Beginning 2 days ago she developed severe persistent watery diarrhea, increasing frequency of sharp crampy upper abdominal pain, and persistent daily nausea. Seems to coincide with weaning off the prednisone. She has the MRI scheduled for next week. She also has +1 pitting edema in both legs that is unresponsive to Lasix. She is not wearing compression stockings.  At that time, my plan was: Discontinue Lasix and start the patient on Bumex 2 mg by mouth daily. Also gave the patient a prescription for compression stockings 15-20 mmHg that are knee-high and instructed her to wear those on a daily basis to help control the swelling. I will give the patient 60 mg of Depo-Medrol can control the diarrhea and try to calm some of the exacerbation basically to try to buy some time until the patient can obtain MRI next week. I would prefer her not to be on prednisone at the time the MRI so that we can get a definitive diagnosis. Patient agrees with this plan. If symptoms worsen she knows to go to the emergency room  12/15/14 EGD was  normal.  MR enterogram was    Past Medical History  Diagnosis Date  . Thyroid disease   . Cystitis   . Anemia   . Anxiety   . Arthritis   . Clotting disorder     undetermined  . Acid reflux   . Neuromuscular disorder   . Hypertension   . Osteoporosis   . 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 Cholecystectomy    . Small intestine surgery  2004    exploratory  . Gastric bypass  05/2001  . Esophagogastroduodenoscopy  10/2014    at [facility], was normal. Dr. Merri Brunette   Current Outpatient Prescriptions on File Prior to Visit  Medication Sig Dispense Refill  . ACIPHEX 20 MG tablet TAKE 1 TABLET (20 MG TOTAL) BY MOUTH 2 (TWO) TIMES DAILY. 60 tablet 1  . ALPRAZolam (XANAX) 0.5 MG tablet TAKE 1 TABLET BY MOUTH TWICE A DAY AS NEEDED FOR ANXIETY 60 tablet 0  . aspirin-acetaminophen-caffeine (EXCEDRIN MIGRAINE) 250-250-65 MG per tablet Take 1 tablet by mouth every 6 (six) hours as needed for headache.    . bumetanide (BUMEX) 2 MG tablet Take 1 tablet (2 mg total) by mouth daily. 30 tablet 0  . Chlorpheniramine-DM (CORICIDIN HBP COUGH/COLD PO) Take 1 tablet by mouth as needed (cough and cold like symptoms).    . furosemide (LASIX) 40 MG tablet Take 40 mg by mouth daily as needed for fluid.    Marland Kitchen HYDROcodone-acetaminophen (NORCO) 10-325 MG per tablet Take 1 tablet by mouth every 6 (six) hours as needed. 90 tablet 0  . levonorgestrel-ethinyl estradiol (ENPRESSE,TRIVORA) tablet Take 1 tablet by mouth daily.    Marland Kitchen levothyroxine (SYNTHROID, LEVOTHROID) 125 MCG tablet Take 1 tablet (125 mcg total) by mouth daily. 90 tablet 3  . levothyroxine (SYNTHROID, LEVOTHROID) 137 MCG tablet TAKE 1 TABLET (137 MCG TOTAL) BY MOUTH DAILY BEFORE BREAKFAST. 30 tablet 3  . metoprolol succinate (TOPROL-XL) 50 MG 24 hr tablet Take 1 tablet (50 mg total) by mouth daily. Take with or immediately following a meal. 90 tablet 3  . nortriptyline (PAMELOR) 75 MG capsule  Take 1 capsule (75 mg total) by mouth at bedtime. 30 capsule 5  . nystatin (MYCOSTATIN) 100000 UNIT/ML suspension Take 5 mLs (500,000 Units total) by mouth 4 (four) times daily. Swish and swallow 120 mL 0  . ondansetron (ZOFRAN) 4 MG tablet Take 1 tablet (4 mg total) by mouth every 8 (eight) hours as needed for nausea or vomiting. 20 tablet 0  . oxyCODONE-acetaminophen (PERCOCET/ROXICET) 5-325 MG per tablet Take 1 tablet by mouth every 6 (six) hours as needed for severe pain. 60 tablet 0  . polyethylene glycol (MIRALAX / GLYCOLAX) packet Take 17 g by mouth daily as needed.    . potassium chloride SA (K-DUR,KLOR-CON) 20 MEQ tablet Take 20 mEq by mouth 2 (two) times daily.    . predniSONE (DELTASONE) 20 MG tablet Take 2 daily for 5 days then 1 daily for 4 days 14 tablet 0  . predniSONE (DELTASONE) 20 MG tablet Take 3 tablets (60 mg total) by mouth daily with breakfast. 60 tablet 0  . pregabalin (LYRICA) 225 MG capsule Take 1 capsule (225 mg total) by mouth 2 (two) times daily. 60 capsule 3  . rizatriptan (MAXALT-MLT) 10 MG disintegrating tablet Take 1 tablet by mouth as needed.  0  . simethicone (MYLICON) 125 MG chewable tablet Chew 125 mg by mouth every 6 (six) hours as needed for flatulence.    . sucralfate (CARAFATE) 1 G tablet Take 1 tablet by mouth 4 (four) times daily.  3  . topiramate (TOPAMAX) 50 MG tablet Take 1 tablet (50 mg total) by mouth at bedtime. 30 tablet 2  . traMADol (ULTRAM) 50 MG tablet Take 1 tablet (50 mg total) by mouth every 6 (six) hours as needed. 30 tablet 0  . Vitamin D, Ergocalciferol, (DRISDOL) 50000 UNITS CAPS capsule Take 1 capsule (50,000 Units total) by mouth every 7 (seven) days. 12  capsule 0   No current facility-administered medications on file prior to visit.   Allergies  Allergen Reactions  . Ibuprofen Nausea Only    Bad stomach pains  . Toradol [Ketorolac Tromethamine] Anxiety  . Aspirin Hives  . Cleocin [Clindamycin Hcl] Itching and Rash  . Clindamycin  Rash   Social History   Social History  . Marital Status: Divorced    Spouse Name: N/A  . Number of Children: N/A  . Years of Education: N/A   Occupational History  . Not on file.   Social History Main Topics  . Smoking status: Current Every Day Smoker -- 0.50 packs/day    Types: Cigarettes  . Smokeless tobacco: Never Used  . Alcohol Use: 6.0 oz/week    10 Shots of liquor per week  . Drug Use: No  . Sexual Activity: Yes    Birth Control/ Protection: Pill   Other Topics Concern  . Not on file   Social History Narrative      Review of Systems  All other systems reviewed and are negative.      Objective:   Physical Exam  Constitutional: She appears well-developed and well-nourished. No distress.  Cardiovascular: Normal rate, regular rhythm and normal heart sounds.   No murmur heard. Pulmonary/Chest: Effort normal and breath sounds normal.  Abdominal: Soft. Bowel sounds are normal. She exhibits no distension. There is no tenderness. There is no rebound and no guarding.  Musculoskeletal: She exhibits no edema.  Skin: She is not diaphoretic. No erythema.          Assessment & Plan:    This encounter was created in error - please disregard.

## 2014-12-17 ENCOUNTER — Encounter (HOSPITAL_COMMUNITY): Payer: Self-pay | Admitting: Emergency Medicine

## 2014-12-17 ENCOUNTER — Other Ambulatory Visit: Payer: Self-pay | Admitting: Family Medicine

## 2014-12-17 ENCOUNTER — Emergency Department (HOSPITAL_COMMUNITY)
Admission: EM | Admit: 2014-12-17 | Discharge: 2014-12-17 | Disposition: A | Discharge status: Home / Self Care | Payer: Medicare Other | Attending: Emergency Medicine | Admitting: Emergency Medicine

## 2014-12-17 ENCOUNTER — Other Ambulatory Visit: Payer: Self-pay | Admitting: Physician Assistant

## 2014-12-17 DIAGNOSIS — Z8619 Personal history of other infectious and parasitic diseases: Secondary | ICD-10-CM | POA: Diagnosis not present

## 2014-12-17 DIAGNOSIS — Z720 Tobacco use: Secondary | ICD-10-CM | POA: Insufficient documentation

## 2014-12-17 DIAGNOSIS — Z862 Personal history of diseases of the blood and blood-forming organs and certain disorders involving the immune mechanism: Secondary | ICD-10-CM | POA: Insufficient documentation

## 2014-12-17 DIAGNOSIS — K0889 Other specified disorders of teeth and supporting structures: Secondary | ICD-10-CM | POA: Diagnosis present

## 2014-12-17 DIAGNOSIS — Z7952 Long term (current) use of systemic steroids: Secondary | ICD-10-CM | POA: Insufficient documentation

## 2014-12-17 DIAGNOSIS — K219 Gastro-esophageal reflux disease without esophagitis: Secondary | ICD-10-CM | POA: Diagnosis not present

## 2014-12-17 DIAGNOSIS — Z793 Long term (current) use of hormonal contraceptives: Secondary | ICD-10-CM | POA: Diagnosis not present

## 2014-12-17 DIAGNOSIS — Z7982 Long term (current) use of aspirin: Secondary | ICD-10-CM | POA: Diagnosis not present

## 2014-12-17 DIAGNOSIS — F419 Anxiety disorder, unspecified: Secondary | ICD-10-CM | POA: Insufficient documentation

## 2014-12-17 DIAGNOSIS — Z79899 Other long term (current) drug therapy: Secondary | ICD-10-CM | POA: Insufficient documentation

## 2014-12-17 DIAGNOSIS — K029 Dental caries, unspecified: Secondary | ICD-10-CM | POA: Insufficient documentation

## 2014-12-17 DIAGNOSIS — I10 Essential (primary) hypertension: Secondary | ICD-10-CM | POA: Diagnosis not present

## 2014-12-17 DIAGNOSIS — M1990 Unspecified osteoarthritis, unspecified site: Secondary | ICD-10-CM | POA: Insufficient documentation

## 2014-12-17 DIAGNOSIS — E079 Disorder of thyroid, unspecified: Secondary | ICD-10-CM | POA: Diagnosis not present

## 2014-12-17 MED ORDER — OXYCODONE-ACETAMINOPHEN 5-325 MG PO TABS: 1.0000 | ORAL_TABLET | ORAL | Status: DC | PRN

## 2014-12-17 MED ORDER — AMOXICILLIN 500 MG PO CAPS: 500.0000 mg | ORAL_CAPSULE | Freq: Three times a day (TID) | ORAL | Status: DC

## 2014-12-17 NOTE — ED Notes (Signed)
Pt sts left lower dental pain x several weeks

## 2014-12-17 NOTE — Telephone Encounter (Signed)
Refill appropriate and filled per protocol. 

## 2014-12-17 NOTE — Discharge Instructions (Signed)
Prescription for antibiotic and pain medicine. You can additionally take ibuprofen. Dental referral made.

## 2014-12-17 NOTE — ED Provider Notes (Signed)
CSN: [id number]     Arrival date & time 12/17/14  1233 History  By signing my name below, I, Dorothy Dyer, attest that this documentation has been prepared under the direction and in the presence of No att. providers found. Electronically Signed: Jarvis Dyer, ED Scribe. 12/17/2014. 4:14 PM.    Chief Complaint  Patient presents with  . Dental Pain    The history is provided by the patient. No language interpreter was used.    HPI Comments: Dorothy Dyer is a 39 y.o. female who presents to the Emergency Department complaining of constant, moderate, left lower dental pain onset 2 weeks. Pt states she believes her filling may have come out. She endorses the pain is exacerbated with eating and applied pressure. Pt has been alternating Tylenol, Advil, Aleve and Motrin with no significant relief. She does not have a dentist she follows up with regularly. Pt states she has had dental problems in the past. She denies any trismus, fevers or trouble swallowing.   Past Medical History  Diagnosis Date  . Thyroid disease   . Cystitis   . Anemia   . Anxiety   . Arthritis   . Clotting disorder (HCC)     undetermined  . Acid reflux   . Neuromuscular disorder (HCC)   . Hypertension   . Osteoporosis   . 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 Cholecystectomy    . Small intestine surgery  2004    exploratory  . Gastric bypass  05/2001  . Esophagogastroduodenoscopy  10/2014    at [facility], was normal. Dr. Merri Brunette   Family History  Problem Relation Age of Onset  . Depression Mother   . Diabetes Mother   . Hyperlipidemia Mother   . Depression Father   . Hearing loss Father   . Hyperlipidemia Father   . Hypertension Father   . Learning disabilities Father   . Mental illness Father   . Asthma Brother   . Alcohol abuse Maternal Grandmother   . Arthritis Maternal Grandmother   . Cancer Maternal Grandmother   . Cancer Maternal  Grandfather   . Mental illness Paternal Grandmother   . Heart disease Paternal Grandfather   . Stroke Paternal Grandfather    Social History  Substance Use Topics  . Smoking status: Current Every Day Smoker -- 0.50 packs/day    Types: Cigarettes  . Smokeless tobacco: Never Used  . Alcohol Use: 6.0 oz/week    10 Shots of liquor per week   OB History    No data available     Review of Systems 10 Systems reviewed and all are negative for acute change except as noted in the HPI.   Allergies  Ibuprofen; Toradol; Aspirin; Cleocin; and Clindamycin  Home Medications   Prior to Admission medications   Medication Sig Start Date End Date Taking? Authorizing Provider  ACIPHEX 20 MG tablet TAKE 1 TABLET (20 MG TOTAL) BY MOUTH 2 (TWO) TIMES DAILY. 11/30/14   Donita Brooks, MD  ALPRAZolam Prudy Feeler) 0.5 MG tablet TAKE 1 TABLET BY MOUTH TWICE A DAY AS NEEDED FOR ANXIETY 11/19/14   Donita Brooks, MD  amoxicillin (AMOXIL) 500 MG capsule Take 1 capsule (500 mg total) by mouth 3 (three) times daily. 12/17/14   Donnetta Hutching, MD  aspirin-acetaminophen-caffeine (EXCEDRIN MIGRAINE) [phone] MG per tablet Take 1 tablet by mouth every 6 (six) hours as needed for headache.    Historical Provider, MD  bumetanide (BUMEX) 2 MG tablet Take 1 tablet (2 mg total) by mouth daily. 09/17/14   Donita Brooks, MD  Chlorpheniramine-DM (CORICIDIN HBP COUGH/COLD PO) Take 1 tablet by mouth as needed (cough and cold like symptoms).    Historical Provider, MD  furosemide (LASIX) 40 MG tablet Take 40 mg by mouth daily as needed for fluid.    Historical Provider, MD  HYDROcodone-acetaminophen (NORCO) 10-325 MG per tablet Take 1 tablet by mouth every 6 (six) hours as needed. 11/26/14   Donita Brooks, MD  levonorgestrel-ethinyl estradiol Geanie Logan) tablet Take 1 tablet by mouth daily.    Historical Provider, MD  levothyroxine (SYNTHROID, LEVOTHROID) 125 MCG tablet Take 1 tablet (125 mcg total) by mouth daily. 10/29/14    Donita Brooks, MD  levothyroxine (SYNTHROID, LEVOTHROID) 137 MCG tablet TAKE 1 TABLET (137 MCG TOTAL) BY MOUTH DAILY BEFORE BREAKFAST. 11/19/14   Donita Brooks, MD  metoprolol succinate (TOPROL-XL) 50 MG 24 hr tablet Take 1 tablet (50 mg total) by mouth daily. Take with or immediately following a meal. 07/01/14   Donita Brooks, MD  nortriptyline (PAMELOR) 75 MG capsule Take 1 capsule (75 mg total) by mouth at bedtime. 06/30/14   Donita Brooks, MD  nystatin (MYCOSTATIN) 100000 UNIT/ML suspension Take 5 mLs (500,000 Units total) by mouth 4 (four) times daily. Swish and swallow 11/03/14   Donita Brooks, MD  ondansetron (ZOFRAN) 4 MG tablet Take 1 tablet (4 mg total) by mouth every 8 (eight) hours as needed for nausea or vomiting. 09/17/14   Donita Brooks, MD  oxyCODONE-acetaminophen (PERCOCET) 5-325 MG tablet Take 1-2 tablets by mouth every 4 (four) hours as needed. 12/17/14   Donnetta Hutching, MD  polyethylene glycol [facility] / Ethelene Hal) packet Take 17 g by mouth daily as needed.    Historical Provider, MD  potassium chloride SA (K-DUR,KLOR-CON) 20 MEQ tablet Take 20 mEq by mouth 2 (two) times daily.    Historical Provider, MD  predniSONE (DELTASONE) 20 MG tablet Take 2 daily for 5 days then 1 daily for 4 days 07/08/14   Dorena Bodo, PA-C  predniSONE (DELTASONE) 20 MG tablet Take 3 tablets (60 mg total) by mouth daily with breakfast. 07/13/14   Donita Brooks, MD  pregabalin (LYRICA) 225 MG capsule Take 1 capsule (225 mg total) by mouth 2 (two) times daily. 06/17/14   Patriciaann Clan Dixon, PA-C  rizatriptan (MAXALT-MLT) 10 MG disintegrating tablet Take 1 tablet by mouth as needed. 03/11/14   Historical Provider, MD  simethicone (MYLICON) 125 MG chewable tablet Chew 125 mg by mouth every 6 (six) hours as needed for flatulence.    Historical Provider, MD  sucralfate (CARAFATE) 1 G tablet Take 1 tablet by mouth 4 (four) times daily. 03/11/14   Historical Provider, MD  topiramate (TOPAMAX) 50 MG tablet Take 1  tablet (50 mg total) by mouth at bedtime. 10/19/14   Donita Brooks, MD  traMADol (ULTRAM) 50 MG tablet Take 1 tablet (50 mg total) by mouth every 6 (six) hours as needed. 06/24/14   Donita Brooks, MD  Vitamin D, Ergocalciferol, (DRISDOL) 50000 UNITS CAPS capsule Take 1 capsule (50,000 Units total) by mouth every 7 (seven) days. 06/19/14   Dorena Bodo, PA-C   Triage Vitals: BP 137/88 mmHg  Pulse 79  Temp(Src) 98.6 F (37 C) (Oral)  Resp 18  Ht 5' (1.524 m)  Wt 194 lb (87.998 kg)  BMI 37.89 kg/m2  SpO2 98%  Physical Exam  Constitutional: She is oriented to person, place, and time. She appears well-developed and well-nourished.  HENT:  Head: Normocephalic and atraumatic.  Mouth/Throat: Uvula is midline, oropharynx is clear and moist and mucous membranes are normal. Dental caries present. No dental abscesses.  Left lower premolar with cavity  Eyes: Conjunctivae and EOM are normal. Pupils are equal, round, and reactive to light.  Neck: Normal range of motion. Neck supple.  Musculoskeletal: Normal range of motion.  Neurological: She is alert and oriented to person, place, and time.  Skin: Skin is warm and dry.  Psychiatric: She has a normal mood and affect. Her behavior is normal.  Nursing note and vitals reviewed.   ED Course  Procedures (including critical care time)  DIAGNOSTIC STUDIES: Oxygen Saturation is 98% on RA, normal by my interpretation.    COORDINATION OF CARE:  2:57 PM- pt states last time she was in the ER she received a voucher for dental coverage. Will put in for consult with social work.  Pt advised of plan for treatment and pt agrees.    Labs Review Labs Reviewed - No data to display  Imaging Review No results found.    EKG Interpretation None      MDM   Final diagnoses:  Tooth caries   Patient has obvious caries in her left lower premolar. Rx Percocet and amoxicillin 500 milligrams. Referral to dentist.  I, Ladarrian Asencio, personally performed  the services described in this documentation. All medical record entries made by the scribe were at my direction and in my presence.  I have reviewed the chart and discharge instructions and agree that the record reflects my personal performance and is accurate and complete. Glady Ouderkirk.  12/17/2014. 4:14 PM.     Donnetta Hutching, MD 12/17/14 [id number]

## 2014-12-17 NOTE — Discharge Planning (Signed)
NCM consulted to assist with dental referral.  NCM placed referral via EPIC to Adult [facility] on call, [place].  [place] office will contact pt with appointment time.

## 2014-12-17 NOTE — Telephone Encounter (Signed)
Ok to refill 

## 2014-12-17 NOTE — ED Notes (Signed)
Social Worker contacted for referral of dental treatment.

## 2014-12-18 NOTE — Telephone Encounter (Signed)
Medication called to pharmacy. 

## 2014-12-18 NOTE — Telephone Encounter (Signed)
ok 

## 2014-12-22 ENCOUNTER — Other Ambulatory Visit: Payer: Self-pay | Admitting: Family Medicine

## 2014-12-22 NOTE — Telephone Encounter (Signed)
ok 

## 2014-12-22 NOTE — Telephone Encounter (Signed)
Medication called to pharmacy. 

## 2014-12-22 NOTE — Telephone Encounter (Signed)
?   Ok to refill  Last ov 11/17/14  Last rf 11/17/14

## 2014-12-24 ENCOUNTER — Other Ambulatory Visit: Payer: Self-pay | Admitting: Family Medicine

## 2014-12-24 ENCOUNTER — Ambulatory Visit (INDEPENDENT_AMBULATORY_CARE_PROVIDER_SITE_OTHER): Payer: Medicare Other | Admitting: Family Medicine

## 2014-12-24 VITALS — BP 108/88 | HR 88 | Temp 98.4°F | Resp 16 | Ht 62.0 in | Wt 197.0 lb

## 2014-12-24 DIAGNOSIS — M25552 Pain in left hip: Secondary | ICD-10-CM

## 2014-12-24 DIAGNOSIS — E559 Vitamin D deficiency, unspecified: Secondary | ICD-10-CM | POA: Diagnosis not present

## 2014-12-24 DIAGNOSIS — E538 Deficiency of other specified B group vitamins: Secondary | ICD-10-CM

## 2014-12-24 DIAGNOSIS — R1084 Generalized abdominal pain: Secondary | ICD-10-CM

## 2014-12-24 DIAGNOSIS — G8929 Other chronic pain: Secondary | ICD-10-CM | POA: Diagnosis not present

## 2014-12-24 MED ORDER — CYANOCOBALAMIN 1000 MCG/ML IJ SOLN
1000.0000 ug | Freq: Once | INTRAMUSCULAR | Status: AC
  Administered 2014-12-24: 1000 ug via INTRAMUSCULAR

## 2014-12-24 NOTE — Progress Notes (Signed)
Subjective:    Patient ID: Dorothy Dyer, female    DOB: 1976/01/23, 39 y.o.   MRN: [id number]  HPI 06/23/14 I have reviewed the patient's extensive past medical history. Patient has had chronic gastrointestinal problems. Patient had a gastric bypass. She then developed a small bowel obstruction. She's had numerous colonoscopies, endoscopies, ERCPs to try to determine the root cause of the patient's chronic abdominal pain. She has been diagnosed with dysmotility, irritable bowel syndrome, and chronic abdominal pain.  I reviewed the recent office visit here at my office with my physician assistant. Shortly thereafter she developed 15/10 lower abdominal pain complicated by diarrhea and nausea and vomiting. She went to the emergency room concerned that she had a bowel obstruction. CT of the abdomen and pelvis there revealed intestinal wall thickening and inflammation consistent with enteritis in the duodenum and small intestine either infectious versus inflammation. She was treated empirically for colitis with Cipro and Flagyl and was given narcotic pain medication and nausea medicine. Her pain is no better. She continues to have diarrhea nausea and vomiting. Her weight today is 14 pounds heavier than her last office visit. I weighed the patient personally and I know that the weight is correct today. She tells me that the weight is wrong. She is also concerned by a wound on her anterior right shin. There is a 1.5 cm erythematous central ulcer that is weeping clear serous fluid. She has +1 pitting edema in her legs and chronic venous insufficiency and venous stasis dermatitis on the leg. The wound was caused by a small trauma over 2 weeks ago however it will not heal due to the weeping edema.  Of note, the patient was declined by local gastroenterologist.  At that time, my plan was:  I am concerned the patient may have noninfectious colitis from possibly inflammatory bowel disease such as Crohn's disease.  She is in  moderate to severe pain. She is requesting more pain medication. I will try empirically treating the patient for inflammatory bowel disease with a prednisone taper. I explained this to the patient. This is not a diagnosis but honestly empiric therapy only. Recheck next week. If symptoms improve, she may benefit from chronic immunosuppression. I treated the wound on her right leg as a venous stasis ulcer by patient the patient in an Radio broadcast assistant. I recommended she elevate her legs is much as possible. I will recheck her next week to replace the Unna boot. I will also refer the patient to gastroenterology at [facility].  06/29/14 Patient had been on antibiotics for over 4 days when I saw her. The pain was worsening. After beginning the prednisone, the patient's symptoms improved dramatically. She states that the severe abdominal pain has now improved. She is now at her baseline chronic abdominal pain. The nausea has improved. She is currently on 20 mg a day of prednisone. The question is if she responding to the prednisone or a she responding to the antibiotics. She is also taking Ultram sparingly for abdominal pain. The wound on her right shin is much improved. It is now 5 mm x 5 mm. The edema in her legs is much improved.  However the patient has lost substantial weight. She states that she is taking fluids and eating normally. She denies failure to tolerate by mouth. However she has lost almost 18 pounds. Patient attributes this to stopping Lyrica on her own and also taking more fluid pills.  At that time, my plan was: Venous stasis ulcer  is improving. I replaced the [employer] boot. Recheck in one week. Given the patient's rapid weight loss, I encouraged her to drink more fluids and to discontinue her fluid pill to avoid dehydration. I will recheck her weight next week.. I will also check a TSH to make sure her levothyroxine is appropriately dosed. I do believe the patient may have inflammatory bowel  disease. I'm willing to begin to wean her down on the prednisone is much as possible. Decrease prednisone to 10 mg a day and recheck in one week. I will wean the patient off prednisone as tolerated. She is scheduled to [facility] a [facility] on May [facility]. I also made a referral to a pain clinic. Recheck next week.  07/09/14 Patient's sedimentation rate last week was 4 which is normal. Her white blood cell count was stable at 12 and this was in the clinical setting of glucocorticoids. Given the fact the diagnosis was in question, we quickly wean the patient down to 10 mg of prednisone last week. She also stopped Cipro and Flagyl. Shortly thereafter her abdominal pain began to worsen. She always has a baseline abdominal pain however it has now risen to 10 on a scale of 10. She reports daily nausea. She reports inability to tolerate by mouth. She is still passing flatus. Her last bowel movement was yesterday. Patient was seen yesterday in this clinic by my physician's assistant. Her abdominal pain worsened. At that time [name] resumed 40 mg of prednisone a day and also gave her a prescription for oxycodone. She is here today for follow-up.  She continues to lose weight.  At that time, my plan was:  It is possible the patient does have inflammatory bowel disease such as Crohn's, and when I quickly weaned her down on the steroids, the colitis resumed. If that is the case, high-dose steroids should calm down the patient quickly. The other possibility is this is infectious colitis that resumed after the patient discontinue the antibiotics. However she took a full 10 day course. Furthermore she is afebrile. I checked a stat CBC, and her white blood cell count was 13 which is essentially unchanged. Therefore, I believe infectious colitis is unlikely. The other possibility would be a bowel obstruction stemming from her numerous intestinal surgeries. I will send the patient for abdominal x-rays immediately to  rule out signs of bowel obstruction. If there is evidence of a bowel obstruction on x-rays, I will recommend that she go to the emergency room for a CT scan and management of a bowel obstruction. Another possibility would be malingering.  I will give the patient 80 mg of Depo-Medrol IM immediately to try to treat a possible Crohn's flare and I will send her immediately to get x-rays of the abdomen. If the x-rays are negative, I will treat the patient with high-dose steroids. I will slowly wean the patient off steroids, 5 mg per week, until she is seen by GI. Obviously if the x-rays are abnormal she would go to the emergency room. If the pain worsens she is to go to the emergency room for a CT scan of the abdomen.  07/13/14 X-ray revealed no evidence of a bowel obstruction. She's been taking 60 mg of prednisone over the weekend per day. She is here today for follow-up. Patient is doing much better since increasing the dose of prednisone. Her pain is back to her baseline. She looks much better today. Her weight has stabilized. She is no longer losing weight. Now  she is complaining of constipation however I attribute this to the narcotics she is taking. At that time, my plan was: I believe the patient has inflammatory bowel disease and I believe she has Crohn's. Therefore I'm going to try to decrease the patient on the prednisone by 10 mg every week. I want her to start 50 mg of prednisone by mouth daily for the next week and then decrease to 40 mg by mouth daily the second week and then 30 mg by mouth daily the third week and so on until she sees gastroenterology. I have warned the patient about prolonged steroid use and the risk of iatrogenic Cushing's disease, osteoporosis, weight gain, and diabetes. She understands and would like to try to wean off the steroids as her body will tolerate. Ultimately, I hope that GI can help [place] with a definitive diagnosis through endoscopy and help [place] find a maintenance medication  that will maintain remission. I recommended that she use MiraLAX to help manage the constipation. Also gave her samples of movantik 25 mg by mouth daily as needed for narcotic induced constipation. I would like to see the patient back in follow-up after she sees the gastroenterologist later this month.  I gave the patient a prescription for 60 mg of prednisone a day but I explained to the patient how I want her to take it. She understands that she is to decrease the dose to 50 mg a day and then by 10 mg each week until she sees gastroenterology.  09/17/14 DNKA last appt.  Saw GI at [facility].  Note is in EPIC.  They are trying to wean her off prednisone and schedule her for MRI enterography.  Patient was doing well on prednisone patient discontinued prednisone last week. Beginning 2 days ago she developed severe persistent watery diarrhea, increasing frequency of sharp crampy upper abdominal pain, and persistent daily nausea. Seems to coincide with weaning off the prednisone. She has the MRI scheduled for next week. She also has +1 pitting edema in both legs that is unresponsive to Lasix. She is not wearing compression stockings.  At that time, my plan was: Discontinue Lasix and start the patient on Bumex 2 mg by mouth daily. Also gave the patient a prescription for compression stockings 15-20 mmHg that are knee-high and instructed her to wear those on a daily basis to help control the swelling. I will give the patient 60 mg of Depo-Medrol can control the diarrhea and try to calm some of the exacerbation basically to try to buy some time until the patient can obtain MRI next week. I would prefer her not to be on prednisone at the time the MRI so that we can get a definitive diagnosis. Patient agrees with this plan. If symptoms worsen she knows to go to the emergency room   12/24/14 Patient [facility] last appt and rescheduled to today.  EGD was normal in August.  I have received no further correspondence from [facility]  regarding her follow up.  Was seen in the ER 10/6 with dental caries and was placed on amoxicillin.  Thankfully, she has not been to the ED for abd pain since spring.    I asked the patient about what happened with [facility]. Apparently Marilynne Drivers was not in her network and therefore she stopped seeing a gastroenterologist at [facility]. She never had the MRI enterography.  She only had the EGD.Marland Kitchen She has been using prednisone intermittently as she deems appropriate. She takes it whenever she has diarrhea. I  explained to the patient that this is not appropriate and she needs to discontinue doing that. I no longer want her on prednisone. To date no abnormality has been found in her GI workup. Apparently [employer] is inside her network. She would like to be referred to [facility].  She continues to use her pain medication 3 times a day as well as her anxiety medication. She is due for urine drug screen. She is not receiving B12 injections. She is not taking vitamin D. Her B12 was found to be low in April. Her vitamin D was found to be very low. She also complains of pain in the posterior aspect of her left hip. The pain is located in the gluteus and in the upper hamstring. She has severe pain with range of motion of the left hip. She denies any falls or injury.   Past Medical History  Diagnosis Date  . Thyroid disease   . Cystitis   . Anemia   . Anxiety   . Arthritis   . Clotting disorder (HCC)     undetermined  . Acid reflux   . Neuromuscular disorder (HCC)   . Hypertension   . Osteoporosis   . 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 Cholecystectomy    . Small intestine surgery  2004    exploratory  . Gastric bypass  05/2001  . Esophagogastroduodenoscopy  10/2014    at [facility], was normal. Dr. Merri Brunette   Current Outpatient Prescriptions on File Prior to Visit  Medication Sig Dispense Refill  . ACIPHEX 20 MG tablet TAKE 1 TABLET (20 MG  TOTAL) BY MOUTH 2 (TWO) TIMES DAILY. 60 tablet 1  . ALPRAZolam (XANAX) 0.5 MG tablet TAKE 1 TABLET TWICE A DAY AS NEEDED FOR ANXIETY 60 tablet 0  . amoxicillin (AMOXIL) 500 MG capsule Take 1 capsule (500 mg total) by mouth 3 (three) times daily. 30 capsule 0  . aspirin-acetaminophen-caffeine (EXCEDRIN MIGRAINE) 250-250-65 MG per tablet Take 1 tablet by mouth every 6 (six) hours as needed for headache.    . bumetanide (BUMEX) 2 MG tablet Take 1 tablet (2 mg total) by mouth daily. 30 tablet 0  . Chlorpheniramine-DM (CORICIDIN HBP COUGH/COLD PO) Take 1 tablet by mouth as needed (cough and cold like symptoms).    . furosemide (LASIX) 40 MG tablet Take 40 mg by mouth daily as needed for fluid.    Marland Kitchen HYDROcodone-acetaminophen (NORCO) 10-325 MG per tablet Take 1 tablet by mouth every 6 (six) hours as needed. 90 tablet 0  . levonorgestrel-ethinyl estradiol (ENPRESSE,TRIVORA) tablet Take 1 tablet by mouth daily.    Marland Kitchen levothyroxine (SYNTHROID, LEVOTHROID) 125 MCG tablet Take 1 tablet (125 mcg total) by mouth daily. 90 tablet 3  . levothyroxine (SYNTHROID, LEVOTHROID) 137 MCG tablet TAKE 1 TABLET (137 MCG TOTAL) BY MOUTH DAILY BEFORE BREAKFAST. 30 tablet 3  . LYRICA 225 MG capsule TAKE 1 CAPSULE TWICE A DAY 60 capsule 0  . metoprolol succinate (TOPROL-XL) 50 MG 24 hr tablet Take 1 tablet (50 mg total) by mouth daily. Take with or immediately following a meal. 90 tablet 3  . nortriptyline (PAMELOR) 75 MG capsule Take 1 capsule (75 mg total) by mouth at bedtime. 30 capsule 5  . nystatin (MYCOSTATIN) 100000 UNIT/ML suspension Take 5 mLs (500,000 Units total) by mouth 4 (four) times daily. Swish and swallow 120 mL 0  . ondansetron (ZOFRAN) 4 MG tablet Take 1 tablet (4 mg  total) by mouth every 8 (eight) hours as needed for nausea or vomiting. 20 tablet 0  . oxyCODONE-acetaminophen (PERCOCET) 5-325 MG tablet Take 1-2 tablets by mouth every 4 (four) hours as needed. 20 tablet 0  . polyethylene glycol (MIRALAX /  GLYCOLAX) packet Take 17 g by mouth daily as needed.    . potassium chloride SA (K-DUR,KLOR-CON) 20 MEQ tablet Take 20 mEq by mouth 2 (two) times daily.    . predniSONE (DELTASONE) 20 MG tablet Take 2 daily for 5 days then 1 daily for 4 days 14 tablet 0  . predniSONE (DELTASONE) 20 MG tablet Take 3 tablets (60 mg total) by mouth daily with breakfast. 60 tablet 0  . rizatriptan (MAXALT-MLT) 10 MG disintegrating tablet Take 1 tablet by mouth as needed.  0  . simethicone (MYLICON) 125 MG chewable tablet Chew 125 mg by mouth every 6 (six) hours as needed for flatulence.    . sucralfate (CARAFATE) 1 G tablet Take 1 tablet by mouth 4 (four) times daily.  3  . topiramate (TOPAMAX) 50 MG tablet TAKE 1 TABLET BY MOUTH AT BEDTIME 30 tablet 2  . traMADol (ULTRAM) 50 MG tablet Take 1 tablet (50 mg total) by mouth every 6 (six) hours as needed. 30 tablet 0  . Vitamin D, Ergocalciferol, (DRISDOL) 50000 UNITS CAPS capsule Take 1 capsule (50,000 Units total) by mouth every 7 (seven) days. 12 capsule 0   No current facility-administered medications on file prior to visit.   Allergies  Allergen Reactions  . Ibuprofen Nausea Only    Bad stomach pains  . Toradol [Ketorolac Tromethamine] Anxiety  . Aspirin Hives  . Cleocin [Clindamycin Hcl] Itching and Rash  . Clindamycin Rash   Social History   Social History  . Marital Status: Divorced    Spouse Name: N/A  . Number of Children: N/A  . Years of Education: N/A   Occupational History  . Not on file.   Social History Main Topics  . Smoking status: Current Every Day Smoker -- 0.50 packs/day    Types: Cigarettes  . Smokeless tobacco: Never Used  . Alcohol Use: 6.0 oz/week    10 Shots of liquor per week  . Drug Use: No  . Sexual Activity: Yes    Birth Control/ Protection: Pill   Other Topics Concern  . Not on file   Social History Narrative      Review of Systems  All other systems reviewed and are negative.      Objective:   Physical  Exam  Constitutional: She appears well-developed and well-nourished. No distress.  Cardiovascular: Normal rate, regular rhythm and normal heart sounds.   No murmur heard. Pulmonary/Chest: Effort normal and breath sounds normal.  Abdominal: Soft. Bowel sounds are normal. She exhibits no distension. There is no tenderness. There is no rebound and no guarding.  Musculoskeletal: She exhibits no edema.       Left hip: She exhibits decreased range of motion, decreased strength and tenderness. She exhibits no swelling, no crepitus and no deformity.  Skin: She is not diaphoretic. No erythema.          Assessment & Plan:  Left hip pain - Plan: DG HIP UNILAT W OR W/O PELVIS 2-3 VIEWS LEFT  Chronic generalized abdominal pain - Plan: Drug Screen, Urine, Ambulatory referral to Gastroenterology  Vitamin B 12 deficiency - Plan: cyanocobalamin ((VITAMIN B-12)) injection 1,000 mcg  Vitamin D deficiency  Coordinating care only seems to be a challenge with this patient.  Both the pain clinic and the GI referral seem to be lost and there has been no further follow-up.  Therefore I will obtain a urine drug screen today to monitor for compliance with her hydrocodone as well as her Xanax. They flew seems that this is keeping her from going to the emergency room. However I will make sure the patient is not abusing her diverting her medication. I will give the patient vitamin B12 1000 g  1 today.  She can return on a monthly basis for this injection. I recommended she start vitamin D 50,000 units by mouth every week for the next 6 months. Her vitamin D levels 4 and April. I also recommended she start calcium 1200 mg a day to help prevent osteoporosis.. I will obtain an x-ray of the left hip. At the present time I believe this is more likely a muscular strain. Her exam is not consistent with intra-articular pathology. I will continue to prescribe her pain medication as well as her anxiety medication as long as the  urine drug screen shows no evidence of abuse or diversion. I [facility] place [facility]

## 2014-12-25 ENCOUNTER — Other Ambulatory Visit: Payer: Medicare Other

## 2014-12-26 LAB — DRUG SCREEN, URINE
Amphetamine Screen, Ur: NEGATIVE
Barbiturate Quant, Ur: NEGATIVE
Benzodiazepines.: POSITIVE — AB
Cocaine Metabolites: NEGATIVE
Creatinine,U: 93.71 mg/dL
MARIJUANA METABOLITE: NEGATIVE
METHADONE: NEGATIVE
OPIATES: NEGATIVE
Phencyclidine (PCP): NEGATIVE
Propoxyphene: NEGATIVE

## 2014-12-28 ENCOUNTER — Other Ambulatory Visit: Payer: Self-pay | Admitting: Family Medicine

## 2014-12-28 ENCOUNTER — Ambulatory Visit
Admission: RE | Admit: 2014-12-28 | Discharge: 2014-12-28 | Disposition: A | Discharge status: Home / Self Care | Payer: Medicare Other | Source: Ambulatory Visit | Attending: Family Medicine | Admitting: Family Medicine

## 2014-12-28 ENCOUNTER — Telehealth: Payer: Self-pay | Admitting: Professional Counselor

## 2014-12-28 ENCOUNTER — Telehealth: Payer: Self-pay | Admitting: *Deleted

## 2014-12-28 DIAGNOSIS — G8929 Other chronic pain: Secondary | ICD-10-CM

## 2014-12-28 DIAGNOSIS — R109 Unspecified abdominal pain: Principal | ICD-10-CM

## 2014-12-28 DIAGNOSIS — M25552 Pain in left hip: Secondary | ICD-10-CM

## 2014-12-28 MED ORDER — HYDROCODONE-ACETAMINOPHEN 10-325 MG PO TABS: 1.0000 | ORAL_TABLET | Freq: Four times a day (QID) | ORAL | Status: DC | PRN

## 2014-12-28 NOTE — Telephone Encounter (Signed)
Please see note on UDS.  Only 45/month.

## 2014-12-28 NOTE — Telephone Encounter (Signed)
Refill appropriate and filled per protocol. 

## 2014-12-28 NOTE — Telephone Encounter (Signed)
Pt called needs refill on Norco 10-325mg   Last ov:12/24/14  Last rf: 11/26/14  ?ok to refill

## 2014-12-28 NOTE — Telephone Encounter (Signed)
Script printed ready for provider signature, left message on pt vm presciption will be ready at 8am tomorrow.

## 2014-12-28 NOTE — Telephone Encounter (Signed)
Patient called regarding need for dental assistance and follow up care. Patient lacks insurance and resources are very limited. Below is the referral patient was given when called back and message left:  Bradd CanaryHerbert McNeal  [phone] [address] [place], [place] [id number]

## 2015-01-12 ENCOUNTER — Ambulatory Visit: Payer: Medicare Other | Admitting: Family Medicine

## 2015-01-14 ENCOUNTER — Ambulatory Visit: Payer: Medicare Other | Admitting: Family Medicine

## 2015-01-14 ENCOUNTER — Other Ambulatory Visit: Payer: Self-pay | Admitting: Physician Assistant

## 2015-01-14 NOTE — Telephone Encounter (Signed)
ok 

## 2015-01-14 NOTE — Telephone Encounter (Signed)
Ok to refill??  Last office visit 12/27/2014.  Last refill 12/18/2014.

## 2015-01-14 NOTE — Telephone Encounter (Signed)
Medication called to pharmacy. 

## 2015-01-18 ENCOUNTER — Ambulatory Visit (INDEPENDENT_AMBULATORY_CARE_PROVIDER_SITE_OTHER): Payer: Medicare Other | Admitting: Family Medicine

## 2015-01-18 ENCOUNTER — Encounter: Payer: Self-pay | Admitting: Family Medicine

## 2015-01-18 VITALS — BP 120/80 | HR 92 | Temp 98.2°F | Wt 191.0 lb

## 2015-01-18 DIAGNOSIS — G8929 Other chronic pain: Secondary | ICD-10-CM

## 2015-01-18 DIAGNOSIS — R1084 Generalized abdominal pain: Secondary | ICD-10-CM | POA: Diagnosis not present

## 2015-01-18 DIAGNOSIS — L309 Dermatitis, unspecified: Secondary | ICD-10-CM

## 2015-01-18 DIAGNOSIS — M2550 Pain in unspecified joint: Secondary | ICD-10-CM | POA: Diagnosis not present

## 2015-01-18 MED ORDER — MOMETASONE FUROATE 0.1 % EX CREA: 1.0000 "application " | TOPICAL_CREAM | Freq: Every day | CUTANEOUS | Status: DC

## 2015-01-18 NOTE — Progress Notes (Signed)
Subjective:    Patient ID: Dorothy Dyer, female    DOB: 1976/01/23, 39 y.o.   MRN: [id number]  HPI 06/23/14 I have reviewed the patient's extensive past medical history. Patient has had chronic gastrointestinal problems. Patient had a gastric bypass. She then developed a small bowel obstruction. She's had numerous colonoscopies, endoscopies, ERCPs to try to determine the root cause of the patient's chronic abdominal pain. She has been diagnosed with dysmotility, irritable bowel syndrome, and chronic abdominal pain.  I reviewed the recent office visit here at my office with my physician assistant. Shortly thereafter she developed 15/10 lower abdominal pain complicated by diarrhea and nausea and vomiting. She went to the emergency room concerned that she had a bowel obstruction. CT of the abdomen and pelvis there revealed intestinal wall thickening and inflammation consistent with enteritis in the duodenum and small intestine either infectious versus inflammation. She was treated empirically for colitis with Cipro and Flagyl and was given narcotic pain medication and nausea medicine. Her pain is no better. She continues to have diarrhea nausea and vomiting. Her weight today is 14 pounds heavier than her last office visit. I weighed the patient personally and I know that the weight is correct today. She tells me that the weight is wrong. She is also concerned by a wound on her anterior right shin. There is a 1.5 cm erythematous central ulcer that is weeping clear serous fluid. She has +1 pitting edema in her legs and chronic venous insufficiency and venous stasis dermatitis on the leg. The wound was caused by a small trauma over 2 weeks ago however it will not heal due to the weeping edema.  Of note, the patient was declined by local gastroenterologist.  At that time, my plan was:  I am concerned the patient may have noninfectious colitis from possibly inflammatory bowel disease such as Crohn's disease.  She is in  moderate to severe pain. She is requesting more pain medication. I will try empirically treating the patient for inflammatory bowel disease with a prednisone taper. I explained this to the patient. This is not a diagnosis but honestly empiric therapy only. Recheck next week. If symptoms improve, she may benefit from chronic immunosuppression. I treated the wound on her right leg as a venous stasis ulcer by patient the patient in an Radio broadcast assistant. I recommended she elevate her legs is much as possible. I will recheck her next week to replace the Unna boot. I will also refer the patient to gastroenterology at [facility].  06/29/14 Patient had been on antibiotics for over 4 days when I saw her. The pain was worsening. After beginning the prednisone, the patient's symptoms improved dramatically. She states that the severe abdominal pain has now improved. She is now at her baseline chronic abdominal pain. The nausea has improved. She is currently on 20 mg a day of prednisone. The question is if she responding to the prednisone or a she responding to the antibiotics. She is also taking Ultram sparingly for abdominal pain. The wound on her right shin is much improved. It is now 5 mm x 5 mm. The edema in her legs is much improved.  However the patient has lost substantial weight. She states that she is taking fluids and eating normally. She denies failure to tolerate by mouth. However she has lost almost 18 pounds. Patient attributes this to stopping Lyrica on her own and also taking more fluid pills.  At that time, my plan was: Venous stasis ulcer  is improving. I replaced the [employer] boot. Recheck in one week. Given the patient's rapid weight loss, I encouraged her to drink more fluids and to discontinue her fluid pill to avoid dehydration. I will recheck her weight next week.. I will also check a TSH to make sure her levothyroxine is appropriately dosed. I do believe the patient may have inflammatory bowel  disease. I'm willing to begin to wean her down on the prednisone is much as possible. Decrease prednisone to 10 mg a day and recheck in one week. I will wean the patient off prednisone as tolerated. She is scheduled to see a gastroenterologist at [facility] on May 18. I also made a referral to a pain clinic. Recheck next week.  07/09/14 Patient's sedimentation rate last week was 4 which is normal. Her white blood cell count was stable at 12 and this was in the clinical setting of glucocorticoids. Given the fact the diagnosis was in question, we quickly wean the patient down to 10 mg of prednisone last week. She also stopped Cipro and Flagyl. Shortly thereafter her abdominal pain began to worsen. She always has a baseline abdominal pain however it has now risen to 10 on a scale of 10. She reports daily nausea. She reports inability to tolerate by mouth. She is still passing flatus. Her last bowel movement was yesterday. Patient was seen yesterday in this clinic by my physician's assistant. Her abdominal pain worsened. At that time [name] resumed 40 mg of prednisone a day and also gave her a prescription for oxycodone. She is here today for follow-up.  She continues to lose weight.  At that time, my plan was:  It is possible the patient does have inflammatory bowel disease such as Crohn's, and when I quickly weaned her down on the steroids, the colitis resumed. If that is the case, high-dose steroids should calm down the patient quickly. The other possibility is this is infectious colitis that resumed after the patient discontinue the antibiotics. However she took a full 10 day course. Furthermore she is afebrile. I checked a stat CBC, and her white blood cell count was 13 which is essentially unchanged. Therefore, I believe infectious colitis is unlikely. The other possibility would be a bowel obstruction stemming from her numerous intestinal surgeries. I will send the patient for abdominal x-rays immediately to  rule out signs of bowel obstruction. If there is evidence of a bowel obstruction on x-rays, I will recommend that she go to the emergency room for a CT scan and management of a bowel obstruction. Another possibility would be malingering.  I will give the patient 80 mg of Depo-Medrol IM immediately to try to treat a possible Crohn's flare and I will send her immediately to get x-rays of the abdomen. If the x-rays are negative, I will treat the patient with high-dose steroids. I will slowly wean the patient off steroids, 5 mg per week, until she is seen by GI. Obviously if the x-rays are abnormal she would go to the emergency room. If the pain worsens she is to go to the emergency room for a CT scan of the abdomen.  07/13/14 X-ray revealed no evidence of a bowel obstruction. She's been taking 60 mg of prednisone over the weekend per day. She is here today for follow-up. Patient is doing much better since increasing the dose of prednisone. Her pain is back to her baseline. She looks much better today. Her weight has stabilized. She is no longer losing weight. Now  she is complaining of constipation however I attribute this to the narcotics she is taking. At that time, my plan was: I believe the patient has inflammatory bowel disease and I believe she has Crohn's. Therefore I'm going to try to decrease the patient on the prednisone by 10 mg every week. I want her to start 50 mg of prednisone by mouth daily for the next week and then decrease to 40 mg by mouth daily the second week and then 30 mg by mouth daily the third week and so on until she sees gastroenterology. I have warned the patient about prolonged steroid use and the risk of iatrogenic Cushing's disease, osteoporosis, weight gain, and diabetes. She understands and would like to try to wean off the steroids as her body will tolerate. Ultimately, I hope that GI can help [place] with a definitive diagnosis through endoscopy and help [place] find a maintenance medication  that will maintain remission. I recommended that she use MiraLAX to help manage the constipation. Also gave her samples of movantik 25 mg by mouth daily as needed for narcotic induced constipation. I would like to see the patient back in follow-up after she sees the gastroenterologist later this month.  I gave the patient a prescription for 60 mg of prednisone a day but I explained to the patient how I want her to take it. She understands that she is to decrease the dose to 50 mg a day and then by 10 mg each week until she sees gastroenterology.  09/17/14 DNKA last appt.  Saw GI at [facility].  Note is in EPIC.  They are trying to wean her off prednisone and schedule her for MRI enterography.  Patient was doing well on prednisone patient discontinued prednisone last week. Beginning 2 days ago she developed severe persistent watery diarrhea, increasing frequency of sharp crampy upper abdominal pain, and persistent daily nausea. Seems to coincide with weaning off the prednisone. She has the MRI scheduled for next week. She also has +1 pitting edema in both legs that is unresponsive to Lasix. She is not wearing compression stockings.  At that time, my plan was: Discontinue Lasix and start the patient on Bumex 2 mg by mouth daily. Also gave the patient a prescription for compression stockings 15-20 mmHg that are knee-high and instructed her to wear those on a daily basis to help control the swelling. I will give the patient 60 mg of Depo-Medrol can control the diarrhea and try to calm some of the exacerbation basically to try to buy some time until the patient can obtain MRI next week. I would prefer her not to be on prednisone at the time the MRI so that we can get a definitive diagnosis. Patient agrees with this plan. If symptoms worsen she knows to go to the emergency room   12/24/14 Patient [facility] last appt and rescheduled to today.  EGD was normal in August.  I have received no further correspondence from [facility]  regarding her follow up.  Was seen in the ER 10/6 with dental caries and was placed on amoxicillin.  Thankfully, she has not been to the ED for abd pain since spring.    I asked the patient about what happened with [facility]. Apparently Marilynne Drivers was not in her network and therefore she stopped seeing a gastroenterologist at [facility]. She never had the MRI enterography.  She only had the EGD.Marland Kitchen She has been using prednisone intermittently as she deems appropriate. She takes it whenever she has diarrhea. I  explained to the patient that this is not appropriate and she needs to discontinue doing that. I no longer want her on prednisone. To date no abnormality has been found in her GI workup. Apparently [employer] is inside her network. She would like to be referred to [facility] GI.  She continues to use her pain medication 3 times a day as well as her anxiety medication. She is due for urine drug screen. She is not receiving B12 injections. She is not taking vitamin D. Her B12 was found to be low in April. Her vitamin D was found to be very low. She also complains of pain in the posterior aspect of her left hip. The pain is located in the gluteus and in the upper hamstring. She has severe pain with range of motion of the left hip. She denies any falls or injury.  At that time, my plan was: Coordinating care only seems to be a challenge with this patient. Both the pain clinic and the GI referral seem to be lost and there has been no further follow-up.  Therefore I will obtain a urine drug screen today to monitor for compliance with her hydrocodone as well as her Xanax. These two meds seem to be keeping her from going to the emergency room. However I will make sure the patient is not abusing her diverting her medication. I will give the patient vitamin B12 1000 g  1 today.  She can return on a monthly basis for this injection. I recommended she start vitamin D 50,000 units by mouth every week for the next 6 months. Her  vitamin D levels 4 and April. I also recommended she start calcium 1200 mg a day to help prevent osteoporosis.. I will obtain an x-ray of the left hip. At the present time I believe this is more likely a muscular strain. Her exam is not consistent with intra-articular pathology. I will continue to prescribe her pain medication as well as her anxiety medication as long as the urine drug screen shows no evidence of abuse or diversion. I did place a referral to [facility] gastroenterology  01/18/15  since that last visit, the patient has missed 2 appointments with me both [facility].    Her urine drug screen revealed only benzodiazepines and no opiates. Please see the results in the lab section. Therefore I decreased her pain medication from 90 pills a month for 45 pills a month. She is here today complaining of diffuse abdominal pain, diffuse musculoskeletal pain. She complains of pain in both knees and in her left hip. She does have pitting on her fingernails consistent with psoriasis and she raises the possibility of psoriatic arthritis. At her previous specialist she was being treated for psoriatic arthritis. She questions if this can be the reason that both her knees hurt so badly and her hip hurts so badly. X-ray of left hip reveal mild degenerative change but pain is out of proportion to findings on x-ray. X-rays of the knee obtained last year in December revealed no arthritis or skeletal pathology. She is again complaining of pain in both knees and in her left hip. She is pointing to increase the amount of her pain medication. I explained to the patient that her behavior makes me uncomfortable. Her frequent missed appointments, her negative urine drug screen, the fact that she did not follow-up with a specialist for over 6 months because her insurance did not cover it yet she reports daily abdominal pain that is severe  and requires her to take the pain medication for one 5 times a day seems inconsistent. She  does have a peeling scaly rash on the palms of both hands and on the soles of her left foot.   Past Medical History  Diagnosis Date  . Thyroid disease   . Cystitis   . Anemia   . Anxiety   . Arthritis   . Clotting disorder (HCC)     undetermined  . Acid reflux   . Neuromuscular disorder (HCC)   . Hypertension   . Osteoporosis   . 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 Cholecystectomy    . Small intestine surgery  2004    exploratory  . Gastric bypass  05/2001  . Esophagogastroduodenoscopy  10/2014    at [facility], was normal. Dr. Merri Brunette   Current Outpatient Prescriptions on File Prior to Visit  Medication Sig Dispense Refill  . ACIPHEX 20 MG tablet TAKE 1 TABLET (20 MG TOTAL) BY MOUTH 2 (TWO) TIMES DAILY. 60 tablet 1  . ALPRAZolam (XANAX) 0.5 MG tablet TAKE 1 TABLET TWICE A DAY AS NEEDED FOR ANXIETY 60 tablet 0  . aspirin-acetaminophen-caffeine (EXCEDRIN MIGRAINE) 250-250-65 MG per tablet Take 1 tablet by mouth every 6 (six) hours as needed for headache.    . bumetanide (BUMEX) 2 MG tablet Take 1 tablet (2 mg total) by mouth daily. 30 tablet 0  . Chlorpheniramine-DM (CORICIDIN HBP COUGH/COLD PO) Take 1 tablet by mouth as needed (cough and cold like symptoms).    . furosemide (LASIX) 40 MG tablet Take 40 mg by mouth daily as needed for fluid.    Marland Kitchen HYDROcodone-acetaminophen (NORCO) 10-325 MG tablet Take 1 tablet by mouth every 6 (six) hours as needed. 45 tablet 0  . levonorgestrel-ethinyl estradiol (ENPRESSE,TRIVORA) tablet Take 1 tablet by mouth daily.    Marland Kitchen levothyroxine (SYNTHROID, LEVOTHROID) 125 MCG tablet Take 1 tablet (125 mcg total) by mouth daily. 90 tablet 3  . levothyroxine (SYNTHROID, LEVOTHROID) 137 MCG tablet TAKE 1 TABLET (137 MCG TOTAL) BY MOUTH DAILY BEFORE BREAKFAST. 30 tablet 3  . LYRICA 225 MG capsule TAKE 1 CAPSULE TWICE A DAY 60 capsule 0  . metoprolol succinate (TOPROL-XL) 50 MG 24 hr tablet Take 1  tablet (50 mg total) by mouth daily. Take with or immediately following a meal. 90 tablet 3  . nortriptyline (PAMELOR) 75 MG capsule Take 1 capsule (75 mg total) by mouth at bedtime. 30 capsule 5  . nystatin (MYCOSTATIN) 100000 UNIT/ML suspension Take 5 mLs (500,000 Units total) by mouth 4 (four) times daily. Swish and swallow 120 mL 0  . ondansetron (ZOFRAN) 4 MG tablet TAKE 1 TABLET (4 MG TOTAL) BY MOUTH EVERY 8 (EIGHT) HOURS AS NEEDED FOR NAUSEA OR VOMITING. 20 tablet 0  . potassium chloride SA (K-DUR,KLOR-CON) 20 MEQ tablet Take 20 mEq by mouth 2 (two) times daily.    . rizatriptan (MAXALT-MLT) 10 MG disintegrating tablet Take 1 tablet by mouth as needed.  0  . simethicone (MYLICON) 125 MG chewable tablet Chew 125 mg by mouth every 6 (six) hours as needed for flatulence.    . sucralfate (CARAFATE) 1 G tablet Take 1 tablet by mouth 4 (four) times daily.  3  . topiramate (TOPAMAX) 50 MG tablet TAKE 1 TABLET BY MOUTH AT BEDTIME 30 tablet 2  . traMADol (ULTRAM) 50 MG tablet Take 1 tablet (50 mg total) by mouth every 6 (six) hours as needed. 30  tablet 0  . Vitamin D, Ergocalciferol, (DRISDOL) 50000 UNITS CAPS capsule Take 1 capsule (50,000 Units total) by mouth every 7 (seven) days. 12 capsule 0   No current facility-administered medications on file prior to visit.   Allergies  Allergen Reactions  . Ibuprofen Nausea Only    Bad stomach pains  . Toradol [Ketorolac Tromethamine] Anxiety  . Aspirin Hives  . Cleocin [Clindamycin Hcl] Itching and Rash  . Clindamycin Rash   Social History   Social History  . Marital Status: Divorced    Spouse Name: N/A  . Number of Children: N/A  . Years of Education: N/A   Occupational History  . Not on file.   Social History Main Topics  . Smoking status: Current Every Day Smoker -- 0.50 packs/day    Types: Cigarettes  . Smokeless tobacco: Never Used  . Alcohol Use: 6.0 oz/week    10 Shots of liquor per week  . Drug Use: No  . Sexual Activity: Yes     Birth Control/ Protection: Pill   Other Topics Concern  . Not on file   Social History Narrative      Review of Systems  All other systems reviewed and are negative.      Objective:   Physical Exam  Constitutional: She appears well-developed and well-nourished. No distress.  Cardiovascular: Normal rate, regular rhythm and normal heart sounds.   No murmur heard. Pulmonary/Chest: Effort normal and breath sounds normal.  Abdominal: Soft. Bowel sounds are normal. She exhibits no distension. There is no tenderness. There is no rebound and no guarding.  Musculoskeletal: She exhibits no edema.       Left hip: She exhibits decreased range of motion, decreased strength and tenderness. She exhibits no swelling, no crepitus and no deformity.  Skin: She is not diaphoretic. No erythema.          Assessment & Plan:  Polyarthralgia - Plan: DG Knee Complete 4 Views Left  Eczema - Plan: mometasone (ELOCON) 0.1 % cream  I will complete the workup for polyarthralgia by obtaining x-rays of the left knee. I will certainly be willing to refer the patient to a rheumatologist for evaluation for possible signs of psoriatic arthritis. However today, her pain is out of proportion to the findings shown on x-ray. I will not increase the amount of her pain medication for the reasons I discussed in history of present illness. Furthermore I told the patient that she missed another appointment [facility]) , she will be dismissed from the practice as this prevents me from taking care of her and my other patients appropriately. 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

## 2015-01-20 ENCOUNTER — Ambulatory Visit
Admission: RE | Admit: 2015-01-20 | Discharge: 2015-01-20 | Disposition: A | Discharge status: Home / Self Care | Payer: Medicare Other | Source: Ambulatory Visit | Attending: Family Medicine | Admitting: Family Medicine

## 2015-01-20 DIAGNOSIS — M2550 Pain in unspecified joint: Secondary | ICD-10-CM

## 2015-01-22 ENCOUNTER — Other Ambulatory Visit: Payer: Self-pay | Admitting: Family Medicine

## 2015-01-22 NOTE — Telephone Encounter (Signed)
?   OK to Refill  

## 2015-01-22 NOTE — Telephone Encounter (Signed)
ok 

## 2015-01-25 ENCOUNTER — Encounter: Payer: Self-pay | Admitting: Physician Assistant

## 2015-01-25 ENCOUNTER — Ambulatory Visit (INDEPENDENT_AMBULATORY_CARE_PROVIDER_SITE_OTHER): Payer: Medicare Other | Admitting: Physician Assistant

## 2015-01-25 ENCOUNTER — Other Ambulatory Visit: Payer: Self-pay | Admitting: Family Medicine

## 2015-01-25 VITALS — BP 162/100 | HR 84 | Temp 98.3°F | Resp 18 | Wt 194.0 lb

## 2015-01-25 DIAGNOSIS — M25552 Pain in left hip: Secondary | ICD-10-CM | POA: Diagnosis not present

## 2015-01-25 DIAGNOSIS — R109 Unspecified abdominal pain: Secondary | ICD-10-CM

## 2015-01-25 DIAGNOSIS — R600 Localized edema: Secondary | ICD-10-CM

## 2015-01-25 DIAGNOSIS — G8929 Other chronic pain: Secondary | ICD-10-CM

## 2015-01-25 MED ORDER — LEVOTHYROXINE SODIUM 137 MCG PO TABS: 137.0000 ug | ORAL_TABLET | Freq: Every day | ORAL | Status: DC

## 2015-01-25 MED ORDER — BUMETANIDE 2 MG PO TABS: 2.0000 mg | ORAL_TABLET | Freq: Every day | ORAL | Status: DC

## 2015-01-25 MED ORDER — CELECOXIB 200 MG PO CAPS: 200.0000 mg | ORAL_CAPSULE | Freq: Two times a day (BID) | ORAL | Status: DC

## 2015-01-25 NOTE — Telephone Encounter (Signed)
Pharmacy:Pick up   Medication:HYDROcodone-acetaminophen (NORCO) 10-325 MG  Qty:  Sig:  Physician: Mary B. Dixon   Patient's phone number:[phone]  Pharmacy: [facility] on [address]   Medication:levothyroxine (SYNTHROID, LEVOTHROID) 137 MCG   Qty:90  GNF:AOZHSig:TAKE 1 TABLET (137 MCG TOTAL) BY MOUTH DAILY BEFORE BREAKFAST  Physician:Mary B. Dixon   Pharmacy:[facility] [address]  Medication:bumetanide (BUMEX) 2 MG tablet   Qty:  YQM:VHQISig:Take 1 tablet (2 mg total) by mouth daily.  Physician:M. B. Dixon

## 2015-01-25 NOTE — Telephone Encounter (Signed)
LRF Hydrocodone 12/28/14  #45, no refills.  OK refill?

## 2015-01-25 NOTE — Telephone Encounter (Signed)
Tell pt that I am sorry but cannot prescribe any more of this at this time.

## 2015-01-26 ENCOUNTER — Telehealth: Payer: Self-pay | Admitting: *Deleted

## 2015-01-26 NOTE — Telephone Encounter (Signed)
pt has appt scheduled for 02/09/15 at 1:15pm w/[facility], left message on vm to return my call

## 2015-01-26 NOTE — Telephone Encounter (Signed)
Pt aware no refill on Hydrocodone.

## 2015-01-26 NOTE — Telephone Encounter (Signed)
Pt called back and aware of appt 

## 2015-01-26 NOTE — Progress Notes (Signed)
Patient ID: Dorothy Dyer MRN: [id number], DOB: [birth year], 39 y.o. Date of Encounter: 01/26/2015, 10:39 AM    Chief Complaint:  Chief Complaint  Patient presents with  . left upper thigh/hip pain    getting worse  . severs pain in feet as well     HPI: 39 y.o. year old female presents with above.   I reviewed Dr. Caren Macadam LOV note, which outlines all of patient's recent OVs with him. This note includes the following information: that pt has recently had XRay of left hip and of knee. Also includes that recent UDS showed no hydrocodone despite the fact that at that time she was prescribed hydrocodone to be taking on a routine basis. At that time, quantity of hydrocodone was reduced.   Today she is wiping her nose throughout the visit, seeming as if she is in pain at times. However, intermittently through the visit, there are times she does not appear in distress/pain. She reports pain in her left buttock, that goes around anterior aspect of left groin and also pain that goes down the left leg.  As she stands to get on exam table, there are moments when she is doing this with no sign of pain, however, once she is on the exam table and I have her perform movements, she moans and groans and wipes at her nose.      Home Meds:   Outpatient Prescriptions Prior to Visit  Medication Sig Dispense Refill  . ACIPHEX 20 MG tablet TAKE 1 TABLET (20 MG TOTAL) BY MOUTH 2 (TWO) TIMES DAILY. 60 tablet 1  . ALPRAZolam (XANAX) 0.5 MG tablet TAKE 1 TABLET TWICE A DAY AS NEEDED 60 tablet 2  . aspirin-acetaminophen-caffeine (EXCEDRIN MIGRAINE) 250-250-65 MG per tablet Take 1 tablet by mouth every 6 (six) hours as needed for headache.    . Chlorpheniramine-DM (CORICIDIN HBP COUGH/COLD PO) Take 1 tablet by mouth as needed (cough and cold like symptoms).    Marland Kitchen HYDROcodone-acetaminophen (NORCO) 10-325 MG tablet Take 1 tablet by mouth every 6 (six) hours as needed. 45 tablet 0  . levothyroxine  (SYNTHROID, LEVOTHROID) 125 MCG tablet Take 1 tablet (125 mcg total) by mouth daily. 90 tablet 3  . LYRICA 225 MG capsule TAKE 1 CAPSULE TWICE A DAY 60 capsule 0  . metoprolol succinate (TOPROL-XL) 50 MG 24 hr tablet Take 1 tablet (50 mg total) by mouth daily. Take with or immediately following a meal. 90 tablet 3  . mometasone (ELOCON) 0.1 % cream Apply 1 application topically daily. 45 g 0  . ondansetron (ZOFRAN) 4 MG tablet TAKE 1 TABLET (4 MG TOTAL) BY MOUTH EVERY 8 (EIGHT) HOURS AS NEEDED FOR NAUSEA OR VOMITING. 20 tablet 0  . potassium chloride SA (K-DUR,KLOR-CON) 20 MEQ tablet Take 20 mEq by mouth 2 (two) times daily.    . rizatriptan (MAXALT-MLT) 10 MG disintegrating tablet Take 1 tablet by mouth as needed.  0  . simethicone (MYLICON) 125 MG chewable tablet Chew 125 mg by mouth every 6 (six) hours as needed for flatulence.    . sucralfate (CARAFATE) 1 G tablet Take 1 tablet by mouth 4 (four) times daily.  3  . topiramate (TOPAMAX) 50 MG tablet TAKE 1 TABLET BY MOUTH AT BEDTIME 30 tablet 2  . bumetanide (BUMEX) 2 MG tablet Take 1 tablet (2 mg total) by mouth daily. 30 tablet 0  . levothyroxine (SYNTHROID, LEVOTHROID) 137 MCG tablet TAKE 1 TABLET (137 MCG TOTAL) BY MOUTH DAILY BEFORE BREAKFAST. 30  tablet 3  . nortriptyline (PAMELOR) 75 MG capsule Take 1 capsule (75 mg total) by mouth at bedtime. 30 capsule 5  . furosemide (LASIX) 40 MG tablet Take 40 mg by mouth daily as needed for fluid.    Marland Kitchen. levonorgestrel-ethinyl estradiol (ENPRESSE,TRIVORA) tablet Take 1 tablet by mouth daily.    . traMADol (ULTRAM) 50 MG tablet Take 1 tablet (50 mg total) by mouth every 6 (six) hours as needed. (Patient not taking: Reported on 01/25/2015) 30 tablet 0  . Vitamin D, Ergocalciferol, (DRISDOL) 50000 UNITS CAPS capsule Take 1 capsule (50,000 Units total) by mouth every 7 (seven) days. (Patient not taking: Reported on 01/25/2015) 12 capsule 0  . nystatin (MYCOSTATIN) 100000 UNIT/ML suspension Take 5 mLs  (500,000 Units total) by mouth 4 (four) times daily. Swish and swallow 120 mL 0   No facility-administered medications prior to visit.    Allergies:  Allergies  Allergen Reactions  . Ibuprofen Nausea Only    Bad stomach pains  . Toradol [Ketorolac Tromethamine] Anxiety  . Aspirin Hives  . Cleocin [Clindamycin Hcl] Itching and Rash  . Clindamycin Rash      Review of Systems: See HPI for pertinent ROS. All other ROS negative.    Physical Exam: Blood pressure 162/100, pulse 84, temperature 98.3 F (36.8 C), temperature source Oral, resp. rate 18, weight 194 lb (87.998 kg), last menstrual period 12/06/2014., Body mass index is 35.47 kg/(m^2). General: WF.  Appears in no acute distress. Neck: Supple. No thyromegaly. No lymphadenopathy. Lungs: Clear bilaterally to auscultation without wheezes, rales, or rhonchi. Breathing is unlabored. Heart: Regular rhythm. No murmurs, rubs, or gallops. Msk:  Strength and tone normal for age. Extremities/Skin: I inspected the skin of the low back--there is no rash, no ecchymosis. She moans and groans throughout SLR and hip abduction. Strength of left leg 5/5. Patella reflexes 2+, equal bilaterally.  Neuro: Alert and oriented X 3. Moves all extremities spontaneously. Gait is normal. CNII-XII grossly in tact. Psych:  Responds to questions appropriately with a normal affect.     ASSESSMENT AND PLAN:  39 y.o. year old female with  1. Left hip pain I prescribed Celebrex for to use. No controlled substance prescribed. Also discussed applying heat to the area and stretching.    Signed, [address][name] LeeDixon, [place], [facility] 01/26/2015 10:39 AM

## 2015-01-28 ENCOUNTER — Telehealth: Payer: Self-pay | Admitting: Family Medicine

## 2015-01-28 NOTE — Telephone Encounter (Signed)
Call placed to patient. LMTRC.  

## 2015-01-28 NOTE — Telephone Encounter (Signed)
Pt is calling for a refill of Vicodin. She states that she is having severe leg pain and can not sleep.  Ph: [phone]

## 2015-01-28 NOTE — Telephone Encounter (Signed)
Ok to refill??  Last office visit 01/25/2015.  Last refill 12/28/2014.

## 2015-01-28 NOTE — Telephone Encounter (Signed)
No.  Denied.

## 2015-01-29 ENCOUNTER — Encounter: Payer: Self-pay | Admitting: Family Medicine

## 2015-01-29 ENCOUNTER — Ambulatory Visit (INDEPENDENT_AMBULATORY_CARE_PROVIDER_SITE_OTHER): Payer: Medicare Other | Admitting: Family Medicine

## 2015-01-29 VITALS — BP 134/78 | HR 68 | Temp 97.8°F | Resp 18 | Wt 186.0 lb

## 2015-01-29 DIAGNOSIS — G8929 Other chronic pain: Secondary | ICD-10-CM | POA: Diagnosis not present

## 2015-01-29 DIAGNOSIS — R109 Unspecified abdominal pain: Secondary | ICD-10-CM

## 2015-01-29 DIAGNOSIS — M25561 Pain in right knee: Secondary | ICD-10-CM

## 2015-01-29 DIAGNOSIS — M25562 Pain in left knee: Secondary | ICD-10-CM

## 2015-01-29 MED ORDER — HYDROCODONE-ACETAMINOPHEN 10-325 MG PO TABS: 1.0000 | ORAL_TABLET | Freq: Three times a day (TID) | ORAL | Status: DC | PRN

## 2015-01-29 NOTE — Patient Instructions (Addendum)
Release of records Dr. Clayborn HeronJason [facility] [place], [place]  Call back on Monday to discuss pain meds with Dr. Tanya NonesPickard

## 2015-01-29 NOTE — Telephone Encounter (Signed)
Patient returned call and made aware.   States that she cannot walk at all and is in need of medication.   Advised that she will require OV.   Appointment scheduled.
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
## 2015-01-29 NOTE — Assessment & Plan Note (Signed)
D/C NSAIDS, obtain records from previous PCP, she has f/u being scheduled with [facility] ? IBD

## 2015-01-30 ENCOUNTER — Other Ambulatory Visit: Payer: Self-pay | Admitting: Family Medicine

## 2015-02-03 ENCOUNTER — Ambulatory Visit (INDEPENDENT_AMBULATORY_CARE_PROVIDER_SITE_OTHER): Payer: Medicare Other | Admitting: Family Medicine

## 2015-02-03 ENCOUNTER — Encounter: Payer: Self-pay | Admitting: Family Medicine

## 2015-02-03 VITALS — BP 126/78 | HR 100 | Temp 97.9°F | Resp 16 | Ht 61.5 in | Wt 185.0 lb

## 2015-02-03 DIAGNOSIS — M79652 Pain in left thigh: Secondary | ICD-10-CM

## 2015-02-03 DIAGNOSIS — M25562 Pain in left knee: Secondary | ICD-10-CM | POA: Diagnosis not present

## 2015-02-03 NOTE — Progress Notes (Signed)
Subjective:    Patient ID: Dorothy Dyer, female    DOB: 1976/01/23, 39 y.o.   MRN: [id number]  HPI 06/23/14 I have reviewed the patient's extensive past medical history. Patient has had chronic gastrointestinal problems. Patient had a gastric bypass. She then developed a small bowel obstruction. She's had numerous colonoscopies, endoscopies, ERCPs to try to determine the root cause of the patient's chronic abdominal pain. She has been diagnosed with dysmotility, irritable bowel syndrome, and chronic abdominal pain.  I reviewed the recent office visit here at my office with my physician assistant. Shortly thereafter she developed 15/10 lower abdominal pain complicated by diarrhea and nausea and vomiting. She went to the emergency room concerned that she had a bowel obstruction. CT of the abdomen and pelvis there revealed intestinal wall thickening and inflammation consistent with enteritis in the duodenum and small intestine either infectious versus inflammation. She was treated empirically for colitis with Cipro and Flagyl and was given narcotic pain medication and nausea medicine. Her pain is no better. She continues to have diarrhea nausea and vomiting. Her weight today is 14 pounds heavier than her last office visit. I weighed the patient personally and I know that the weight is correct today. She tells me that the weight is wrong. She is also concerned by a wound on her anterior right shin. There is a 1.5 cm erythematous central ulcer that is weeping clear serous fluid. She has +1 pitting edema in her legs and chronic venous insufficiency and venous stasis dermatitis on the leg. The wound was caused by a small trauma over 2 weeks ago however it will not heal due to the weeping edema.  Of note, the patient was declined by local gastroenterologist.  At that time, my plan was:  I am concerned the patient may have noninfectious colitis from possibly inflammatory bowel disease such as Crohn's disease.  She is in  moderate to severe pain. She is requesting more pain medication. I will try empirically treating the patient for inflammatory bowel disease with a prednisone taper. I explained this to the patient. This is not a diagnosis but honestly empiric therapy only. Recheck next week. If symptoms improve, she may benefit from chronic immunosuppression. I treated the wound on her right leg as a venous stasis ulcer by patient the patient in an Radio broadcast assistant. I recommended she elevate her legs is much as possible. I will recheck her next week to replace the Unna boot. I will also refer the patient to gastroenterology at [facility].  06/29/14 Patient had been on antibiotics for over 4 days when I saw her. The pain was worsening. After beginning the prednisone, the patient's symptoms improved dramatically. She states that the severe abdominal pain has now improved. She is now at her baseline chronic abdominal pain. The nausea has improved. She is currently on 20 mg a day of prednisone. The question is if she responding to the prednisone or a she responding to the antibiotics. She is also taking Ultram sparingly for abdominal pain. The wound on her right shin is much improved. It is now 5 mm x 5 mm. The edema in her legs is much improved.  However the patient has lost substantial weight. She states that she is taking fluids and eating normally. She denies failure to tolerate by mouth. However she has lost almost 18 pounds. Patient attributes this to stopping Lyrica on her own and also taking more fluid pills.  At that time, my plan was: Venous stasis ulcer  is improving. I replaced the [employer] boot. Recheck in one week. Given the patient's rapid weight loss, I encouraged her to drink more fluids and to discontinue her fluid pill to avoid dehydration. I will recheck her weight next week.. I will also check a TSH to make sure her levothyroxine is appropriately dosed. I do believe the patient may have inflammatory bowel  disease. I'm willing to begin to wean her down on the prednisone is much as possible. Decrease prednisone to 10 mg a day and recheck in one week. I will wean the patient off prednisone as tolerated. She is scheduled to see a gastroenterologist at [facility] on May 18. I also made a referral to a pain clinic. Recheck next week.  07/09/14 Patient's sedimentation rate last week was 4 which is normal. Her white blood cell count was stable at 12 and this was in the clinical setting of glucocorticoids. Given the fact the diagnosis was in question, we quickly wean the patient down to 10 mg of prednisone last week. She also stopped Cipro and Flagyl. Shortly thereafter her abdominal pain began to worsen. She always has a baseline abdominal pain however it has now risen to 10 on a scale of 10. She reports daily nausea. She reports inability to tolerate by mouth. She is still passing flatus. Her last bowel movement was yesterday. Patient was seen yesterday in this clinic by my physician's assistant. Her abdominal pain worsened. At that time Dorothy HaleMary Dyer resumed 40 mg of prednisone a day and also gave her a prescription for oxycodone. She is here today for follow-up.  She continues to lose weight.  At that time, my plan was:  It is possible the patient does have inflammatory bowel disease such as Crohn's, and when I quickly weaned her down on the steroids, the colitis resumed. If that is the case, high-dose steroids should calm down the patient quickly. The other possibility is this is infectious colitis that resumed after the patient discontinue the antibiotics. However she took a full 10 day course. Furthermore she is afebrile. I checked a stat CBC, and her white blood cell count was 13 which is essentially unchanged. Therefore, I believe infectious colitis is unlikely. The other possibility would be a bowel obstruction stemming from her numerous intestinal surgeries. I will send the patient for abdominal x-rays immediately to  rule out signs of bowel obstruction. If there is evidence of a bowel obstruction on x-rays, I will recommend that she go to the emergency room for a CT scan and management of a bowel obstruction. Another possibility would be malingering.  I will give the patient 80 mg of Depo-Medrol IM immediately to try to treat a possible Crohn's flare and I will send her immediately to get x-rays of the abdomen. If the x-rays are negative, I will treat the patient with high-dose steroids. I will slowly wean the patient off steroids, 5 mg per week, until she is seen by GI. Obviously if the x-rays are abnormal she would go to the emergency room. If the pain worsens she is to go to the emergency room for a CT scan of the abdomen.  07/13/14 X-ray revealed no evidence of a bowel obstruction. She's been taking 60 mg of prednisone over the weekend per day. She is here today for follow-up. Patient is doing much better since increasing the dose of prednisone. Her pain is back to her baseline. She looks much better today. Her weight has stabilized. She is no longer losing weight. Now  she is complaining of constipation however I attribute this to the narcotics she is taking. At that time, my plan was: I believe the patient has inflammatory bowel disease and I believe she has Crohn's. Therefore I'm going to try to decrease the patient on the prednisone by 10 mg every week. I want her to start 50 mg of prednisone by mouth daily for the next week and then decrease to 40 mg by mouth daily the second week and then 30 mg by mouth daily the third week and so on until she sees gastroenterology. I have warned the patient about prolonged steroid use and the risk of iatrogenic Cushing's disease, osteoporosis, weight gain, and diabetes. She understands and would like to try to wean off the steroids as her body will tolerate. Ultimately, I hope that GI can help [place] with a definitive diagnosis through endoscopy and help [place] find a maintenance medication  that will maintain remission. I recommended that she use MiraLAX to help manage the constipation. Also gave her samples of movantik 25 mg by mouth daily as needed for narcotic induced constipation. I would like to see the patient back in follow-up after she sees the gastroenterologist later this month.  I gave the patient a prescription for 60 mg of prednisone a day but I explained to the patient how I want her to take it. She understands that she is to decrease the dose to 50 mg a day and then by 10 mg each week until she sees gastroenterology.  09/17/14 DNKA last appt.  Saw GI at [facility].  Note is in EPIC.  They are trying to wean her off prednisone and schedule her for MRI enterography.  Patient was doing well on prednisone patient discontinued prednisone last week. Beginning 2 days ago she developed severe persistent watery diarrhea, increasing frequency of sharp crampy upper abdominal pain, and persistent daily nausea. Seems to coincide with weaning off the prednisone. She has the MRI scheduled for next week. She also has +1 pitting edema in both legs that is unresponsive to Lasix. She is not wearing compression stockings.  At that time, my plan was: Discontinue Lasix and start the patient on Bumex 2 mg by mouth daily. Also gave the patient a prescription for compression stockings 15-20 mmHg that are knee-high and instructed her to wear those on a daily basis to help control the swelling. I will give the patient 60 mg of Depo-Medrol can control the diarrhea and try to calm some of the exacerbation basically to try to buy some time until the patient can obtain MRI next week. I would prefer her not to be on prednisone at the time the MRI so that we can get a definitive diagnosis. Patient agrees with this plan. If symptoms worsen she knows to go to the emergency room   12/24/14 Patient [facility] last appt and rescheduled to today.  EGD was normal in August.  I have received no further correspondence from [facility]  regarding her follow up.  Was seen in the ER 10/6 with dental caries and was placed on amoxicillin.  Thankfully, she has not been to the ED for abd pain since spring.    I asked the patient about what happened with [facility]. Apparently Marilynne Drivers was not in her network and therefore she stopped seeing a gastroenterologist at [facility]. She never had the MRI enterography.  She only had the EGD.Marland Kitchen She has been using prednisone intermittently as she deems appropriate. She takes it whenever she has diarrhea. I  explained to the patient that this is not appropriate and she needs to discontinue doing that. I no longer want her on prednisone. To date no abnormality has been found in her GI workup. Apparently [employer] is inside her network. She would like to be referred to [facility] GI.  She continues to use her pain medication 3 times a day as well as her anxiety medication. She is due for urine drug screen. She is not receiving B12 injections. She is not taking vitamin D. Her B12 was found to be low in April. Her vitamin D was found to be very low. She also complains of pain in the posterior aspect of her left hip. The pain is located in the gluteus and in the upper hamstring. She has severe pain with range of motion of the left hip. She denies any falls or injury.  At that time, my plan was: Coordinating care only seems to be a challenge with this patient. Both the pain clinic and the GI referral seem to be lost and there has been no further follow-up.  Therefore I will obtain a urine drug screen today to monitor for compliance with her hydrocodone as well as her Xanax. These two meds seem to be keeping her from going to the emergency room. However I will make sure the patient is not abusing her diverting her medication. I will give the patient vitamin B12 1000 g  1 today.  She can return on a monthly basis for this injection. I recommended she start vitamin D 50,000 units by mouth every week for the next 6 months. Her  vitamin D levels 4 and April. I also recommended she start calcium 1200 mg a day to help prevent osteoporosis.. I will obtain an x-ray of the left hip. At the present time I believe this is more likely a muscular strain. Her exam is not consistent with intra-articular pathology. I will continue to prescribe her pain medication as well as her anxiety medication as long as the urine drug screen shows no evidence of abuse or diversion. I did place a referral to [facility] gastroenterology  01/18/15  since that last visit, the patient has missed 2 appointments with me both [facility].    Her urine drug screen revealed only benzodiazepines and no opiates. Please see the results in the lab section. Therefore I decreased her pain medication from 90 pills a month for 45 pills a month. She is here today complaining of diffuse abdominal pain, diffuse musculoskeletal pain. She complains of pain in both knees and in her left hip. She does have pitting on her fingernails consistent with psoriasis and she raises the possibility of psoriatic arthritis. At her previous specialist she was being treated for psoriatic arthritis. She questions if this can be the reason that both her knees hurt so badly and her hip hurts so badly. X-ray of left hip reveal mild degenerative change but pain is out of proportion to findings on x-ray. X-rays of the knee obtained last year in December revealed no arthritis or skeletal pathology. She is again complaining of pain in both knees and in her left hip. She is pointing to increase the amount of her pain medication. I explained to the patient that her behavior makes me uncomfortable. Her frequent missed appointments, her negative urine drug screen, the fact that she did not follow-up with a specialist for over 6 months because her insurance did not cover it yet she reports daily abdominal pain that is severe  and requires her to take the pain medication for one 5 times a day seems inconsistent. She  does have a peeling scaly rash on the palms of both hands and on the soles of her left foot.  At that time, my plan was:  I will complete the workup for polyarthralgia by obtaining x-rays of the left knee. I will certainly be willing to refer the patient to a rheumatologist for evaluation for possible signs of psoriatic arthritis. However today, her pain is out of proportion to the findings shown on x-ray. I will not increase the amount of her pain medication for the reasons I discussed in history of present illness. Furthermore I told the patient that she missed another appointment [facility]) , she will be dismissed from the practice as this prevents me from taking care of her and my other patients appropriately. 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  02/03/15 She is scheduled to see the rheumatologist along with [facility] gastroenterology.  Both appointments are coming up in the next 2 weeks. However she is now complaining of severe pain in her left leg. She states that she is unable to walk on her left knee. 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 Her pain is out of proportion to exam. When I palpate the left lateral joint line, the patient screams and cries. She is unable to sit still on the exam table while examined her knee. She reports 10 out of 10 pain with range of motion of the left knee. She has a positive Apley grind. She also has pain with valgus and varus stress. She also complains of severe pain in the upper left thigh along the groin muscle.  There are no palpable abnormalities in this area. However she reports severe pain with range of motion of the hip. She screams when I palpate this area with my hand. Her pain is out of proportion to examination. At this time I'm concerned she may possibly have a meniscal tear versus malingering and a muscle strain in her quadriceps/groin muscle.  She is asking  for more pain pills. Please see the previous office visits with my partners will they denied more pain pills. I am very concerned the patient is drug seeking. I again refused to increase the quantity of pain pill she gets per month.   Past Medical History  Diagnosis Date  . Thyroid disease   . Cystitis   . Anemia   . Anxiety   . Arthritis   . Clotting disorder (HCC)     undetermined  . Acid reflux   . Neuromuscular disorder (HCC)   . Hypertension   . Osteoporosis   . 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 Cholecystectomy    . Small intestine surgery  2004    exploratory  . Gastric bypass  05/2001  . Esophagogastroduodenoscopy  10/2014    at [facility], was normal. Dr. Merri Brunette   Current Outpatient Prescriptions on File Prior to Visit  Medication Sig Dispense Refill  . ACIPHEX 20 MG tablet TAKE 1 TABLET BY MOUTH TWICE A DAY 60 tablet 11  . ALPRAZolam (XANAX) 0.5 MG tablet TAKE 1 TABLET TWICE A DAY AS NEEDED 60 tablet 2  . bumetanide (BUMEX) 2 MG tablet Take 2 mg by mouth daily.    . celecoxib (CELEBREX) 200 MG capsule Take 1 capsule (200  mg total) by mouth 2 (two) times daily. 30 capsule 0  . Chlorpheniramine-DM (CORICIDIN HBP COUGH/COLD PO) Take 1 tablet by mouth as needed (cough and cold like symptoms).    . furosemide (LASIX) 40 MG tablet Take 40 mg by mouth daily as needed for fluid.    Marland Kitchen HYDROcodone-acetaminophen (NORCO) 10-325 MG tablet Take 1 tablet by mouth 3 (three) times daily as needed. 9 tablet 0  . levonorgestrel-ethinyl estradiol (ENPRESSE,TRIVORA) tablet Take 1 tablet by mouth daily.    Marland Kitchen levothyroxine (SYNTHROID, LEVOTHROID) 125 MCG tablet Take 1 tablet (125 mcg total) by mouth daily. 90 tablet 3  . levothyroxine (SYNTHROID, LEVOTHROID) 137 MCG tablet Take 1 tablet (137 mcg total) by mouth daily before breakfast. Take every other day, alternate with 125 mcg dose. 15 tablet 5  . LYRICA 225 MG capsule TAKE 1  CAPSULE TWICE A DAY 60 capsule 0  . metoprolol succinate (TOPROL-XL) 50 MG 24 hr tablet Take 1 tablet (50 mg total) by mouth daily. Take with or immediately following a meal. 90 tablet 3  . mometasone (ELOCON) 0.1 % cream Apply 1 application topically daily. 45 g 0  . nortriptyline (PAMELOR) 75 MG capsule TAKE 1 CAPSULE (75 MG TOTAL) BY MOUTH AT BEDTIME. 30 capsule 5  . ondansetron (ZOFRAN) 4 MG tablet TAKE 1 TABLET (4 MG TOTAL) BY MOUTH EVERY 8 (EIGHT) HOURS AS NEEDED FOR NAUSEA OR VOMITING. 20 tablet 0  . potassium chloride SA (K-DUR,KLOR-CON) 20 MEQ tablet Take 20 mEq by mouth 2 (two) times daily.    . simethicone (MYLICON) 125 MG chewable tablet Chew 125 mg by mouth every 6 (six) hours as needed for flatulence.    . sucralfate (CARAFATE) 1 G tablet Take 1 tablet by mouth 4 (four) times daily.  3  . topiramate (TOPAMAX) 50 MG tablet TAKE 1 TABLET BY MOUTH AT BEDTIME 30 tablet 2   No current facility-administered medications on file prior to visit.   Allergies  Allergen Reactions  . Ibuprofen Nausea Only    Bad stomach pains  . Toradol [Ketorolac Tromethamine] Anxiety  . Aspirin Hives  . Cleocin [Clindamycin Hcl] Itching and Rash  . Clindamycin Rash   Social History   Social History  . Marital Status: Divorced    Spouse Name: N/A  . Number of Children: N/A  . Years of Education: N/A   Occupational History  . Not on file.   Social History Main Topics  . Smoking status: Current Every Day Smoker -- 0.50 packs/day    Types: Cigarettes  . Smokeless tobacco: Never Used  . Alcohol Use: 6.0 oz/week    10 Shots of liquor per week  . Drug Use: No  . Sexual Activity: Yes    Birth Control/ Protection: Pill   Other Topics Concern  . Not on file   Social History Narrative      Review of Systems  All other systems reviewed and are negative.      Objective:   Physical Exam  Constitutional: She appears well-developed and well-nourished. No distress.  Cardiovascular: Normal  rate, regular rhythm and normal heart sounds.   No murmur heard. Pulmonary/Chest: Effort normal and breath sounds normal.  Abdominal: Soft. Bowel sounds are normal. She exhibits no distension. There is no tenderness. There is no rebound and no guarding.  Musculoskeletal: She exhibits no edema.       Left hip: She exhibits decreased range of motion, decreased strength and tenderness. She exhibits no swelling, no crepitus and no  deformity.       Left knee: She exhibits decreased range of motion, bony tenderness and abnormal meniscus. She exhibits no swelling, no effusion, no erythema, no LCL laxity, normal patellar mobility and no MCL laxity. Tenderness found. Medial joint line, lateral joint line, MCL, LCL and patellar tendon tenderness noted.  Skin: She is not diaphoretic. No erythema.          Assessment & Plan:  Left knee pain - Plan: MR Knee Left  Wo Contrast  Left thigh pain  I will schedule the patient for an MRI of the left knee to rule out a meniscal tear. Using sterile technique, I injected the left knee with a mixture of 2 mL of lidocaine, 2 mL of Marcaine, and 2 mL of 40 mg per mL Kenalog. Patient is scheduled to see a rheumatologist to discuss her multiple joint pains out of proportion to the x-ray findings for possible psoriatic arthritis. She has follow-up at [facility] gastroenterology although she has not complained of any abdominal pain as her pain pattern now seems to be more musculoskeletal. I will not increase her narcotic prescription. At best I can refer the patient to a pain clinic. However I will wait to see the results of MRI first

## 2015-02-09 ENCOUNTER — Telehealth: Payer: Self-pay | Admitting: Family Medicine

## 2015-02-09 NOTE — Telephone Encounter (Signed)
?   OK to Refill - Last refill 01/29/15

## 2015-02-09 NOTE — Telephone Encounter (Signed)
Stomach pain with gas bubbles and patient is requesting refill on vicodin or pain medication. She states that she is double over in serve pain. Please advise. She states she has been like this since last week when she came in and saw Dr. Tanya NonesPickard but there wasn't;t enough time to discuss. She goes on the 14th to [facility]. [phone]

## 2015-02-09 NOTE — Telephone Encounter (Signed)
Cancel please 

## 2015-02-11 NOTE — Telephone Encounter (Signed)
Tried to call on cell # and mailbox is full - unable to leave message

## 2015-02-11 NOTE — Telephone Encounter (Signed)
I will not refill pain medication until 12/18.  (45 has to last 1 month).  Explain to the patient we normally discharge a patient and discontinue all narcotics if they fail a UDS like she did. I am trying to work with her.

## 2015-02-11 NOTE — Telephone Encounter (Signed)
Tried to call no answer and no vm 

## 2015-02-14 ENCOUNTER — Other Ambulatory Visit: Payer: Self-pay | Admitting: Family Medicine

## 2015-02-15 NOTE — Telephone Encounter (Signed)
rx called in

## 2015-02-15 NOTE — Telephone Encounter (Signed)
ok 

## 2015-02-15 NOTE — Telephone Encounter (Signed)
Ok to refill??  Last office visit 02/03/2015.  Last refill 01/14/2015.

## 2015-02-18 ENCOUNTER — Telehealth: Payer: Self-pay | Admitting: Family Medicine

## 2015-02-18 DIAGNOSIS — G8929 Other chronic pain: Secondary | ICD-10-CM

## 2015-02-18 DIAGNOSIS — R109 Unspecified abdominal pain: Principal | ICD-10-CM

## 2015-02-18 MED ORDER — HYDROCODONE-ACETAMINOPHEN 10-325 MG PO TABS: 1.0000 | ORAL_TABLET | Freq: Three times a day (TID) | ORAL | Status: DC | PRN

## 2015-02-18 NOTE — Telephone Encounter (Signed)
See note for 02/18/15

## 2015-02-18 NOTE — Telephone Encounter (Signed)
Patient is calling to get rx for her Vicodin, and also wants to talk to you about an illness she has right now   [phone]

## 2015-02-18 NOTE — Telephone Encounter (Signed)
Per [facility] will refill q month for #45 tablets and no more then that and she can pick up a rx tomorrow. Pt aware to p/u rx  tomorrow

## 2015-02-18 NOTE — Telephone Encounter (Signed)
LMTRC

## 2015-02-23 ENCOUNTER — Encounter (HOSPITAL_COMMUNITY): Payer: Self-pay | Admitting: Nurse Practitioner

## 2015-02-23 ENCOUNTER — Emergency Department (HOSPITAL_COMMUNITY)
Admission: EM | Admit: 2015-02-23 | Discharge: 2015-02-23 | Disposition: A | Discharge status: Home / Self Care | Payer: Medicare Other | Attending: Emergency Medicine | Admitting: Emergency Medicine

## 2015-02-23 DIAGNOSIS — Z79899 Other long term (current) drug therapy: Secondary | ICD-10-CM | POA: Diagnosis not present

## 2015-02-23 DIAGNOSIS — Z7951 Long term (current) use of inhaled steroids: Secondary | ICD-10-CM | POA: Diagnosis not present

## 2015-02-23 DIAGNOSIS — M1990 Unspecified osteoarthritis, unspecified site: Secondary | ICD-10-CM | POA: Diagnosis not present

## 2015-02-23 DIAGNOSIS — Z8619 Personal history of other infectious and parasitic diseases: Secondary | ICD-10-CM | POA: Insufficient documentation

## 2015-02-23 DIAGNOSIS — Z87448 Personal history of other diseases of urinary system: Secondary | ICD-10-CM | POA: Diagnosis not present

## 2015-02-23 DIAGNOSIS — I10 Essential (primary) hypertension: Secondary | ICD-10-CM | POA: Insufficient documentation

## 2015-02-23 DIAGNOSIS — G709 Myoneural disorder, unspecified: Secondary | ICD-10-CM | POA: Insufficient documentation

## 2015-02-23 DIAGNOSIS — E079 Disorder of thyroid, unspecified: Secondary | ICD-10-CM | POA: Insufficient documentation

## 2015-02-23 DIAGNOSIS — M79602 Pain in left arm: Secondary | ICD-10-CM

## 2015-02-23 DIAGNOSIS — F419 Anxiety disorder, unspecified: Secondary | ICD-10-CM | POA: Diagnosis not present

## 2015-02-23 DIAGNOSIS — M79605 Pain in left leg: Secondary | ICD-10-CM

## 2015-02-23 DIAGNOSIS — Z862 Personal history of diseases of the blood and blood-forming organs and certain disorders involving the immune mechanism: Secondary | ICD-10-CM | POA: Insufficient documentation

## 2015-02-23 DIAGNOSIS — G8929 Other chronic pain: Secondary | ICD-10-CM | POA: Diagnosis not present

## 2015-02-23 DIAGNOSIS — F17210 Nicotine dependence, cigarettes, uncomplicated: Secondary | ICD-10-CM | POA: Diagnosis not present

## 2015-02-23 DIAGNOSIS — M25552 Pain in left hip: Secondary | ICD-10-CM | POA: Insufficient documentation

## 2015-02-23 DIAGNOSIS — M25562 Pain in left knee: Secondary | ICD-10-CM | POA: Diagnosis not present

## 2015-02-23 DIAGNOSIS — Z8719 Personal history of other diseases of the digestive system: Secondary | ICD-10-CM | POA: Insufficient documentation

## 2015-02-23 DIAGNOSIS — Z791 Long term (current) use of non-steroidal anti-inflammatories (NSAID): Secondary | ICD-10-CM | POA: Insufficient documentation

## 2015-02-23 NOTE — ED Notes (Signed)
she is currently awaiting ortho appt for L leg and hip pain. Over this weekend, the pain became worse and shes having trouble walking or taking care of household needs because the pain is so severe. She has tried vicodin with no relief. Ambulatory, cms itnact

## 2015-02-23 NOTE — ED Provider Notes (Signed)
CSN: [id number]     Arrival date & time 02/23/15  1214 History  By signing my name below, I, Tanda Rockers, attest that this documentation has been prepared under the direction and in the presence of [employer], PA-C. Electronically Signed: Tanda Rockers, ED Scribe. 02/23/2015. 1:50 PM.   Chief Complaint  Patient presents with  . Leg Pain   The history is provided by the patient. No language interpreter was used.     HPI Comments: Dorothy Dyer is a 39 y.o. female with hx chronic left hip pain who presents to the Emergency Department complaining of gradual onset, constant, left leg pain, greatest in the hip, x a couple of months, worsening in the last few days. No known injury, trauma, or fall. Pt has been following up with her PCP for her pain and saw a rheumatologist approximately 10 days ago. She was referred to an orthopedist to have an MRI but states she cannot set up an appointment with the orthopedist until she sends over her records from an old orthopedist in [place]. Pt was prescribed 45 Vicodin on 02/18/2015 for chronic abdominal pain and had it filled on 02/19/2015 (approximatley 5 days ago). She states that the Vicodin has been giving her relief from her abdominal pain but that it is not working for her leg pain. In the past few days she has been unable to walk or complete normal daily activity without difficulty due to the pain, prompting her to come to the ED today. Pt is also complaining of bilateral leg swelling. She is prescribed Lasix but states she does not like taking it because she takes it around 8 PM at night and it keeps her up all night. Denies weakness, numbness, tingling, or any other associated symptoms.   Past Medical History  Diagnosis Date  . Thyroid disease   . Cystitis   . Anemia   . Anxiety   . Arthritis   . Clotting disorder (HCC)     undetermined  . Acid reflux   . Neuromuscular disorder (HCC)   . Hypertension   . Osteoporosis   . 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 Cholecystectomy    . Small intestine surgery  2004    exploratory  . Gastric bypass  05/2001  . Esophagogastroduodenoscopy  10/2014    at [facility], was normal. Dr. Merri Brunette   Family History  Problem Relation Age of Onset  . Depression Mother   . Diabetes Mother   . Hyperlipidemia Mother   . Depression Father   . Hearing loss Father   . Hyperlipidemia Father   . Hypertension Father   . Learning disabilities Father   . Mental illness Father   . Asthma Brother   . Alcohol abuse Maternal Grandmother   . Arthritis Maternal Grandmother   . Cancer Maternal Grandmother   . Cancer Maternal Grandfather   . Mental illness Paternal Grandmother   . Heart disease Paternal Grandfather   . Stroke Paternal Grandfather    Social History  Substance Use Topics  . Smoking status: Current Every Day Smoker -- 0.50 packs/day    Types: Cigarettes  . Smokeless tobacco: Never Used  . Alcohol Use: 6.0 oz/week    10 Shots of liquor per week   OB History    No data available     Review of Systems  All other systems reviewed and are negative.  Allergies  Ibuprofen; Toradol; Aspirin; Cleocin; and  Clindamycin  Home Medications   Prior to Admission medications   Medication Sig Start Date End Date Taking? Authorizing Provider  ACIPHEX 20 MG tablet TAKE 1 TABLET BY MOUTH TWICE A DAY 02/01/15   Donita Brooks, MD  ALPRAZolam Prudy Feeler) 0.5 MG tablet TAKE 1 TABLET TWICE A DAY AS NEEDED 01/22/15   Donita Brooks, MD  bumetanide (BUMEX) 2 MG tablet Take 2 mg by mouth daily.    Historical Provider, MD  celecoxib (CELEBREX) 200 MG capsule Take 1 capsule (200 mg total) by mouth 2 (two) times daily. 01/25/15   Dorena Bodo, PA-C  Chlorpheniramine-DM (CORICIDIN HBP COUGH/COLD PO) Take 1 tablet by mouth as needed (cough and cold like symptoms).    Historical Provider, MD  furosemide (LASIX) 40 MG tablet Take 40 mg by  mouth daily as needed for fluid.    Historical Provider, MD  HYDROcodone-acetaminophen (NORCO) 10-325 MG tablet Take 1 tablet by mouth 3 (three) times daily as needed. 02/19/15   Donita Brooks, MD  levonorgestrel-ethinyl estradiol Geanie Logan) tablet Take 1 tablet by mouth daily.    Historical Provider, MD  levothyroxine (SYNTHROID, LEVOTHROID) 125 MCG tablet Take 1 tablet (125 mcg total) by mouth daily. 10/29/14   Donita Brooks, MD  levothyroxine (SYNTHROID, LEVOTHROID) 137 MCG tablet Take 1 tablet (137 mcg total) by mouth daily before breakfast. Take every other day, alternate with 125 mcg dose. 01/25/15   Donita Brooks, MD  LYRICA 225 MG capsule TAKE ONE CAPSULE BY MOUTH TWICE A DAY 02/15/15   Donita Brooks, MD  metoprolol succinate (TOPROL-XL) 50 MG 24 hr tablet Take 1 tablet (50 mg total) by mouth daily. Take with or immediately following a meal. 07/01/14   Donita Brooks, MD  mometasone (ELOCON) 0.1 % cream Apply 1 application topically daily. 01/18/15   Donita Brooks, MD  nortriptyline (PAMELOR) 75 MG capsule TAKE 1 CAPSULE (75 MG TOTAL) BY MOUTH AT BEDTIME. 01/26/15   Donita Brooks, MD  ondansetron (ZOFRAN) 4 MG tablet TAKE 1 TABLET (4 MG TOTAL) BY MOUTH EVERY 8 (EIGHT) HOURS AS NEEDED FOR NAUSEA OR VOMITING. 12/28/14   Donita Brooks, MD  potassium chloride SA (K-DUR,KLOR-CON) 20 MEQ tablet Take 20 mEq by mouth 2 (two) times daily.    Historical Provider, MD  simethicone (MYLICON) 125 MG chewable tablet Chew 125 mg by mouth every 6 (six) hours as needed for flatulence.    Historical Provider, MD  sucralfate (CARAFATE) 1 G tablet Take 1 tablet by mouth 4 (four) times daily. 03/11/14   Historical Provider, MD  topiramate (TOPAMAX) 50 MG tablet TAKE 1 TABLET BY MOUTH AT BEDTIME 12/17/14   Donita Brooks, MD   Triage Vitals: BP 122/91 mmHg  Pulse 96  Temp(Src) 97.6 F (36.4 C) (Oral)  Resp 16  SpO2 100%   Physical Exam  Constitutional: She is oriented to person,  place, and time. She appears well-developed and well-nourished. No distress.  HENT:  Head: Normocephalic and atraumatic.  Eyes: Conjunctivae are normal. Right eye exhibits no discharge. Left eye exhibits no discharge. No scleral icterus.  Cardiovascular: Normal rate.   Pulmonary/Chest: Effort normal.  Musculoskeletal:  Negative anterior/poster drawer bilaterally. Negative ballottement test. No varus or valgus laxity. No crepitus. Significant TTP over L knee cap and L hip joint, however pani is distractable. No obvious bony deformity.     Neurological: She is alert and oriented to person, place, and time. No cranial nerve deficit. Coordination normal.  Strength 5/5 throughout. No sensory deficits.  No gait abnormality.   Skin: Skin is warm and dry. No rash noted. She is not diaphoretic. No erythema. No pallor.  Psychiatric: She has a normal mood and affect. Her behavior is normal.  Nursing note and vitals reviewed.   ED Course  Procedures (including critical care time)  DIAGNOSTIC STUDIES: Oxygen Saturation is 100% on RA, normal by my interpretation.    COORDINATION OF CARE: 1:47 PM-Discussed treatment plan which includes referral to pain management clinic with pt at bedside and pt agreed to plan.   Labs Review Labs Reviewed - No data to display  Imaging Review No results found.   EKG Interpretation None      MDM   Final diagnoses:  Chronic leg pain, left  Pt with chronic pain exacerbation. No trauma or injury. Pain is out of proportion to exam. Pain is also distractable on exam.Pt has been seen by rheumatology and is scheduled to follow up with orthopedic next week for same symptoms. Pt picked up a prescriptions for 45 Vicodin pills 5 days ago. This is documented on the national database. Upon review of documentation of pt's primary care visits, they have expressed concern of drug seeking vs malingering and have refused to increase amount of pain medication that pt gets per  month. Discussed with pt that I will not be giving her any more narcotics today as she has many at home that she may take for this pain. No advanced imaging indicated at thsi time as pt has had multiple imaging studies. NO neurological deficits. Pt is ambulatory in ED. Pt will be d/c home with ortho follow up.   I personally performed the services described in this documentation, which was scribed in my presence. The recorded information has been reviewed and is accurate.       Lester KinsmanSamantha Tripp GormanDowless, PA-C 02/26/15 1455  Mancel BaleElliott Wentz, MD 02/26/15 1536

## 2015-02-23 NOTE — Discharge Instructions (Signed)
Chronic Pain  Chronic pain can be defined as pain that is off and on and lasts for 3-6 months or longer. Many things cause chronic pain, which can make it difficult to make a diagnosis. There are many treatment options available for chronic pain. However, finding a treatment that works well for you may require trying various approaches until the right one is found. Many people benefit from a combination of two or more types of treatment to control their pain.  SYMPTOMS   Chronic pain can occur anywhere in the body and can range from mild to very severe. Some types of chronic pain include:  · Headache.  · Low back pain.  · Cancer pain.  · Arthritis pain.  · Neurogenic pain. This is pain resulting from damage to nerves.   People with chronic pain may also have other symptoms such as:  · Depression.  · Anger.  · Insomnia.  · Anxiety.  DIAGNOSIS   Your health care provider will help diagnose your condition over time. In many cases, the initial focus will be on excluding possible conditions that could be causing the pain. Depending on your symptoms, your health care provider may order tests to diagnose your condition. Some of these tests may include:   · Blood tests.    · CT scan.    · MRI.    · X-rays.    · Ultrasounds.    · Nerve conduction studies.    You may need to see a specialist.   TREATMENT   Finding treatment that works well may take time. You may be referred to a pain specialist. He or she may prescribe medicine or therapies, such as:   · Mindful meditation or yoga.  · Shots (injections) of numbing or pain-relieving medicines into the spine or area of pain.  · Local electrical stimulation.  · Acupuncture.    · Massage therapy.    · Aroma, color, light, or sound therapy.    · Biofeedback.    · Working with a physical therapist to keep from getting stiff.    · Regular, gentle exercise.    · Cognitive or behavioral therapy.    · Group support.    Sometimes, surgery may be recommended.   HOME CARE INSTRUCTIONS    · Take all medicines as directed by your health care provider.    · Lessen stress in your life by relaxing and doing things such as listening to calming music.    · Exercise or be active as directed by your health care provider.    · Eat a healthy diet and include things such as vegetables, fruits, fish, and lean meats in your diet.    · Keep all follow-up appointments with your health care provider.    · Attend a support group with others suffering from chronic pain.  SEEK MEDICAL CARE IF:   · Your pain gets worse.    · You develop a new pain that was not there before.    · You cannot tolerate medicines given to you by your health care provider.    · You have new symptoms since your last visit with your health care provider.    SEEK IMMEDIATE MEDICAL CARE IF:   · You feel weak.    · You have decreased sensation or numbness.    · You lose control of bowel or bladder function.    · Your pain suddenly gets much worse.    · You develop shaking.  · You develop chills.  · You develop confusion.  · You develop chest pain.  · You develop shortness of breath.    MAKE SURE YOU:  ·   Document Revised: 10/30/2012 Document Reviewed: 08/23/2012 Elsevier Interactive Patient Education [employer]  Cryotherapy Cryotherapy is when you put ice on your injury. Ice helps lessen pain and puffiness (swelling) after an injury. Ice works the best when you start using it in the first 24 to 48 hours after an injury. HOME CARE  Put a dry or damp towel between the ice pack and your skin.  You may press gently on the ice pack.  Leave the ice on for no more than 10 to 20 minutes at a  time.  Check your skin after 5 minutes to make sure your skin is okay.  Rest at least 20 minutes between ice pack uses.  Stop using ice when your skin loses feeling (numbness).  Do not use ice on someone who cannot tell you when it hurts. This includes small children and people with memory problems (dementia). GET HELP RIGHT AWAY IF:  You have white spots on your skin.  Your skin turns blue or pale.  Your skin feels waxy or hard.  Your puffiness gets worse. MAKE SURE YOU:   Understand these instructions.  Will watch your condition.  Will get help right away if you are not doing well or get worse.   This information is not intended to replace advice given to you by your health care provider. Make sure you discuss any questions you have with your health care provider.   Document Released: 08/16/2007 Document Revised: 05/22/2011 Document Reviewed: 10/20/2010 Elsevier Interactive Patient Education 2016 Elsevier Inc.  Pain Without a Known Cause WHAT IS PAIN WITHOUT A KNOWN CAUSE? Pain can occur in any part of the body and can range from mild to severe. Sometimes no cause can be found for why you are having pain. Some types of pain that can occur without a known cause include:   Headache.  Back pain.  Abdominal pain.  Neck pain. HOW IS PAIN WITHOUT A KNOWN CAUSE DIAGNOSED?  Your health care provider will try to find the cause of your pain. This may include:  Physical exam.  Medical history.  Blood tests.  Urine tests.  X-rays. If no cause is found, your health care provider may diagnose you with pain without a known cause.  IS THERE TREATMENT FOR PAIN WITHOUT A CAUSE?  Treatment depends on the kind of pain you have. Your health care provider may prescribe medicines to help relieve your pain.  WHAT CAN I DO AT HOME FOR MY PAIN?   Take medicines only as directed by your health care provider.  Stop any activities that cause pain. During periods of severe pain, bed  rest may help.  Try to reduce your stress with activities such as yoga or meditation. Talk to your health care provider for other stress-reducing activity recommendations.  Exercise regularly, if approved by your health care provider.  Eat a healthy diet that includes fruits and vegetables. This may improve pain. Talk to your health care provider if you have any questions about your diet. WHAT IF MY PAIN DOES NOT GET BETTER?  If you have a painful condition and no reason can be found for the pain or the pain gets worse, it is important to follow up with your health care provider. It may be necessary to repeat tests and look further for a possible cause.    This information is not intended to replace advice given to you by your health care provider. Make sure you discuss any questions you have with your health  care provider.   Document Released: 11/22/2000 Document Revised: 03/20/2014 Document Reviewed: 07/15/2013 Elsevier Interactive Patient Education 2016 [employer]  Pain Medicine Instructions HOW CAN PAIN MEDICINE AFFECT ME? You were given a prescription for pain medicine. This medicine may make you tired or drowsy and may affect your ability to think clearly. Pain medicine may also affect your ability to drive or perform certain physical activities. It may not be possible to make all of your pain go away, but you should be comfortable enough to move, breathe, and take care of yourself. HOW OFTEN SHOULD I TAKE PAIN MEDICINE AND HOW MUCH SHOULD I TAKE?  Take pain medicine only as directed by your health care provider and only as needed for pain.  You do not need to take pain medicine if you are not having pain, unless directed by your health care provider.  You can take less than the prescribed dose if you find that a smaller amount of medicine controls your pain. WHAT RESTRICTIONS DO I HAVE WHILE TAKING PAIN MEDICINE? Follow these instructions after you start taking pain medicine,  while you are taking the medicine, and for 8 hours after you stop taking the medicine:  Do not drive.  Do not operate machinery.  Do not operate power tools.  Do not sign legal documents.  Do not drink alcohol.  Do not take sleeping pills.  Do not supervise children by yourself.  Do not participate in activities that require climbing or being in high places.  Do not enter a body of water--such as a lake, river, ocean, spa, or swimming pool--without an adult nearby who can monitor and help you. HOW CAN I KEEP OTHERS SAFE WHILE I AM TAKING PAIN MEDICINE?  Store your pain medicine as directed by your health care provider. Make sure that it is placed where children and pets cannot reach it.  Never share your pain medicine with anyone.  Do not save any leftover pills. If you have any leftover pain medicine, get rid of it or destroy it as directed by your health care provider. WHAT ELSE DO I NEED TO KNOW ABOUT TAKING PAIN MEDICINE?  Use a stool softener if you become constipated from your pain medicine. Increasing your intake of fruits and vegetables will also help with constipation.  Write down the times when you take your pain medicine. Look at the times before you take your next dose of medicine. It is easy to become confused while on pain medicine. Recording the times helps you to avoid an overdose.  If your pain is severe, do not try to treat it yourself by taking more pills than instructed on your prescription. Contact your health care provider for help.  You may have been prescribed a pain medicine that contains acetaminophen. Do not take any other acetaminophen while taking this medicine. An overdose of acetaminophen can result in severe liver damage. Acetaminophen is found in many over-the-counter (OTC) and prescription medicines. If you are taking any medicines in addition to your pain medicine, check the active ingredients on those medicines to see if acetaminophen is  listed. WHEN SHOULD I CALL MY HEALTH CARE PROVIDER?  Your medicine is not helping to make the pain go away.  You vomit or have diarrhea shortly after taking the medicine.  You develop new pain in areas that did not hurt before.  You have an allergic reaction to your medicine. This may include:  Itchiness.  Swelling.  Dizziness.  Developing a new rash. WHEN SHOULD I  CALL 911 OR GO TO THE EMERGENCY ROOM?  You feel dizzy or you faint.  You are very confused or disoriented.  You repeatedly vomit.  Your skin or lips turn pale or bluish in color.  You have shortness of breath or you are breathing much more slowly than usual.  You have a severe allergic reaction to your medicine. This includes:  Developing tongue swelling.  Having difficulty breathing.   This information is not intended to replace advice given to you by your health care provider. Make sure you discuss any questions you have with your health care provider.  Follow up with your primary care provider if symptoms do not improve. Return to the ED if you experience numbness/tingling,discoloration of your extremity.

## 2015-02-26 ENCOUNTER — Ambulatory Visit: Payer: Medicare Other | Admitting: Family Medicine

## 2015-02-26 ENCOUNTER — Telehealth: Payer: Self-pay | Admitting: Family Medicine

## 2015-02-26 NOTE — Telephone Encounter (Signed)
Patient called in states she can barely walk she was seen at the orthopedic office yesterday they say they beleive patient has a stress fracture and that is why she can barely walk. She has cancelled her appt for today and will call back to reschedule.

## 2015-03-01 ENCOUNTER — Telehealth: Payer: Self-pay | Admitting: Family Medicine

## 2015-03-01 NOTE — Telephone Encounter (Signed)
Pt would like to speak with Dr. Caren MacadamPickard's nurse regarding an appointment that she had today.  [phone]

## 2015-03-02 NOTE — Telephone Encounter (Signed)
LMTRC

## 2015-03-03 NOTE — Telephone Encounter (Signed)
Pt called to inform us of how dissatisfied with our care and what had happened to her and that now she will have to have surgery. 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 She states that she has been in here several times and we have done nothing for her but label her as a pain seeker. She states that we have not gotten her old medical records from her [place] in [place] and that none of her appts has been scheduled. I informed pt that appts were made and for various reasons she did not make it to those appointments and if you no show for some appts then they will not reschedule you. Pt has various reasons as to why appts were not kept. She was also upset about being labeled as a pain seeker as per her she is not like that. I informed her of the controlled medication rules and that her UDS did not contain any pain medication. Again reason was that she had been out a week before this appt. Pt needs medical clearence to have surgery and as per [facility] - he is willing to fill it out without having to have her come in for ov.

## 2015-03-11 ENCOUNTER — Telehealth: Payer: Self-pay | Admitting: Family Medicine

## 2015-03-11 NOTE — Telephone Encounter (Signed)
Patient is calling regarding her leg swelling, and the medication she is on for this  [phone] or [phone]

## 2015-03-12 MED ORDER — FUROSEMIDE 40 MG PO TABS: 40.0000 mg | ORAL_TABLET | Freq: Every day | ORAL | Status: DC | PRN

## 2015-03-12 NOTE — Telephone Encounter (Signed)
Tried to call pt several times and apparently she is having phone trouble and can not hear me - will try to call again

## 2015-03-12 NOTE — Telephone Encounter (Signed)
We could try d/c bumex and start lasix 40 mg poqd and continue potassium.  Have the patient start wearing compression hose (knee high) if she is not already doing so.  Recheck Monday if no better.

## 2015-03-12 NOTE — Telephone Encounter (Signed)
Pt aware and med sent to pharm - pt request rx for compression stockings and will be glad to do that but she is going out of town - will call when she gets back. Informed pt to go get panty hose with spandex in them and wear them - she agreed and will call if no better.

## 2015-03-12 NOTE — Telephone Encounter (Signed)
Spoke to pt and she states that her legs are still swelling up on her- she took 3 tabs of the Bumex 2mg  yesterday and she barley went to the bathroom. I questioned her about drinking fluid and she states that she does not drink a lot of fluids that she sips b/c of previous gastric bypass surgery she can not guzzle liquids. She also stated that she thinks the lasik worked better. Please Advise.

## 2015-03-18 ENCOUNTER — Other Ambulatory Visit: Payer: Self-pay | Admitting: Family Medicine

## 2015-03-18 NOTE — Telephone Encounter (Signed)
ok 

## 2015-03-18 NOTE — Telephone Encounter (Signed)
Ok to refill??  Last office visit 02/03/2015.  Last refill 02/15/2015.  Of note, patient was seen in ED again for pain on 02/23/2015.

## 2015-03-19 NOTE — Telephone Encounter (Signed)
RX called in .

## 2015-03-23 ENCOUNTER — Telehealth: Payer: Self-pay | Admitting: Family Medicine

## 2015-03-23 DIAGNOSIS — R109 Unspecified abdominal pain: Principal | ICD-10-CM

## 2015-03-23 DIAGNOSIS — G8929 Other chronic pain: Secondary | ICD-10-CM

## 2015-03-23 NOTE — Telephone Encounter (Signed)
Pt is calling for a refill of Hydrocodone 10-325 [phone]

## 2015-03-23 NOTE — Telephone Encounter (Signed)
ok 

## 2015-03-23 NOTE — Telephone Encounter (Signed)
?   OK to Refill  - LRF 02/19/15

## 2015-03-24 MED ORDER — HYDROCODONE-ACETAMINOPHEN 10-325 MG PO TABS: 1.0000 | ORAL_TABLET | Freq: Three times a day (TID) | ORAL | Status: DC | PRN

## 2015-03-24 NOTE — Telephone Encounter (Signed)
rx printed, signed and up front - tried to call pt but vm full - pt can p/u after 2 today

## 2015-03-25 ENCOUNTER — Other Ambulatory Visit: Payer: Self-pay | Admitting: *Deleted

## 2015-03-25 MED ORDER — POTASSIUM CHLORIDE CRYS ER 20 MEQ PO TBCR: 20.0000 meq | EXTENDED_RELEASE_TABLET | Freq: Two times a day (BID) | ORAL | Status: DC

## 2015-03-25 NOTE — Telephone Encounter (Signed)
Received call from patient.   States that she is having weakness in her legs. Reports that she has been doubling up on her Lasix for a few days due to swelling, but has not been taking her K+.  Advised to resume K+. Advised to schedule OV with MD. Reports that she cannot come in on 03/26/2015.  Advised if weakness continues over weekend to go to ER.

## 2015-03-27 ENCOUNTER — Other Ambulatory Visit: Payer: Self-pay | Admitting: Family Medicine

## 2015-04-22 ENCOUNTER — Telehealth: Payer: Self-pay | Admitting: Family Medicine

## 2015-04-22 DIAGNOSIS — R109 Unspecified abdominal pain: Principal | ICD-10-CM

## 2015-04-22 DIAGNOSIS — G8929 Other chronic pain: Secondary | ICD-10-CM

## 2015-04-22 NOTE — Telephone Encounter (Signed)
?   OK to Refill  

## 2015-04-22 NOTE — Telephone Encounter (Signed)
Ok with one refill.  I have received no word from her surgeon regarding her hip surgery.  When is that being done?

## 2015-04-22 NOTE — Telephone Encounter (Signed)
Pt needs a refill of Vicodin.  [phone]

## 2015-04-23 MED ORDER — HYDROCODONE-ACETAMINOPHEN 10-325 MG PO TABS: 1.0000 | ORAL_TABLET | Freq: Three times a day (TID) | ORAL | Status: DC | PRN

## 2015-04-23 NOTE — Telephone Encounter (Signed)
lmtrc

## 2015-04-23 NOTE — Telephone Encounter (Signed)
Rx printed and left up front. 

## 2015-04-24 ENCOUNTER — Other Ambulatory Visit: Payer: Self-pay | Admitting: Family Medicine

## 2015-04-26 NOTE — Telephone Encounter (Signed)
LRF 03/29/15 #60  LOV 02/03/15  OK refill?

## 2015-04-26 NOTE — Telephone Encounter (Signed)
Rx called in 

## 2015-04-26 NOTE — Telephone Encounter (Signed)
ok 

## 2015-04-27 ENCOUNTER — Other Ambulatory Visit: Payer: Self-pay | Admitting: Family Medicine

## 2015-04-27 NOTE — Telephone Encounter (Signed)
ok 

## 2015-04-27 NOTE — Telephone Encounter (Signed)
Ok to refill??  Last office visit 02/03/2015.  Last refill 01/22/2015, #2 refills.

## 2015-04-27 NOTE — Telephone Encounter (Signed)
Medication called to pharmacy. 

## 2015-05-20 ENCOUNTER — Other Ambulatory Visit: Payer: Self-pay | Admitting: Family Medicine

## 2015-05-21 ENCOUNTER — Other Ambulatory Visit: Payer: Self-pay | Admitting: Family Medicine

## 2015-05-21 NOTE — Telephone Encounter (Signed)
Duplicate request

## 2015-05-21 NOTE — Telephone Encounter (Signed)
ok 

## 2015-05-21 NOTE — Telephone Encounter (Signed)
LRF 04/26/15 #60  LOV 02/03/15  OK refill?

## 2015-05-21 NOTE — Telephone Encounter (Signed)
Medication refilled per protocol. 

## 2015-05-25 ENCOUNTER — Telehealth: Payer: Self-pay | Admitting: Family Medicine

## 2015-05-25 DIAGNOSIS — R109 Unspecified abdominal pain: Principal | ICD-10-CM

## 2015-05-25 DIAGNOSIS — G8929 Other chronic pain: Secondary | ICD-10-CM

## 2015-05-25 NOTE — Telephone Encounter (Signed)
Patient needs rx for her hydrocodone  [phone]

## 2015-05-25 NOTE — Telephone Encounter (Signed)
Okay the patient is also overdue for office visit

## 2015-05-25 NOTE — Telephone Encounter (Signed)
?   OK to Refill  

## 2015-05-26 MED ORDER — HYDROCODONE-ACETAMINOPHEN 10-325 MG PO TABS: 1.0000 | ORAL_TABLET | Freq: Three times a day (TID) | ORAL | Status: DC | PRN

## 2015-05-26 NOTE — Telephone Encounter (Signed)
Tried to call no answer no vm 

## 2015-05-27 NOTE — Telephone Encounter (Signed)
RX printed, left up front and patient aware to pick up and appt made 

## 2015-06-14 ENCOUNTER — Ambulatory Visit (INDEPENDENT_AMBULATORY_CARE_PROVIDER_SITE_OTHER): Payer: Medicare Other | Admitting: Family Medicine

## 2015-06-14 ENCOUNTER — Encounter: Payer: Self-pay | Admitting: Family Medicine

## 2015-06-14 VITALS — BP 124/80 | HR 100 | Temp 98.0°F | Resp 16 | Ht 61.5 in | Wt 185.0 lb

## 2015-06-14 DIAGNOSIS — M87059 Idiopathic aseptic necrosis of unspecified femur: Secondary | ICD-10-CM | POA: Insufficient documentation

## 2015-06-14 DIAGNOSIS — E038 Other specified hypothyroidism: Secondary | ICD-10-CM

## 2015-06-14 DIAGNOSIS — L409 Psoriasis, unspecified: Secondary | ICD-10-CM | POA: Diagnosis not present

## 2015-06-14 DIAGNOSIS — R109 Unspecified abdominal pain: Secondary | ICD-10-CM | POA: Diagnosis not present

## 2015-06-14 DIAGNOSIS — G8929 Other chronic pain: Secondary | ICD-10-CM

## 2015-06-14 DIAGNOSIS — M25552 Pain in left hip: Secondary | ICD-10-CM

## 2015-06-14 LAB — COMPLETE METABOLIC PANEL WITHOUT GFR
ALT: 12 U/L (ref 6–29)
AST: 16 U/L (ref 10–30)
Albumin: 3.5 g/dL — ABNORMAL LOW (ref 3.6–5.1)
Alkaline Phosphatase: 99 U/L (ref 33–115)
BUN: 12 mg/dL (ref 7–25)
CO2: 23 mmol/L (ref 20–31)
Calcium: 8.3 mg/dL — ABNORMAL LOW (ref 8.6–10.2)
Chloride: 106 mmol/L (ref 98–110)
Creat: 0.58 mg/dL (ref 0.50–1.10)
GFR, Est African American: >89 mL/min
GFR, Est Non African American: >89 mL/min
Glucose, Bld: 77 mg/dL (ref 70–99)
Potassium: 3.8 mmol/L (ref 3.5–5.3)
Sodium: 141 mmol/L (ref 135–146)
Total Bilirubin: 0.6 mg/dL (ref 0.2–1.2)
Total Protein: 5.5 g/dL — ABNORMAL LOW (ref 6.1–8.1)

## 2015-06-14 LAB — CBC WITH DIFFERENTIAL/PLATELET
BASOS ABS: 0 {cells}/uL (ref 0–200)
Basophils Relative: 0 %
EOS ABS: 71 {cells}/uL (ref 15–500)
EOS PCT: 1 %
HCT: 38.6 % (ref 35.0–45.0)
Hemoglobin: 12.7 g/dL (ref 12.0–15.0)
LYMPHS PCT: 28 %
Lymphs Abs: 1988 cells/uL (ref 850–3900)
MCH: 31.4 pg (ref 27.0–33.0)
MCHC: 32.9 g/dL (ref 32.0–36.0)
MCV: 95.5 fL (ref 80.0–100.0)
MONOS PCT: 9 %
MPV: 11.6 fL (ref 7.5–12.5)
Monocytes Absolute: 639 cells/uL (ref 200–950)
NEUTROS PCT: 62 %
Neutro Abs: 4402 cells/uL (ref 1500–7800)
PLATELETS: 156 10*3/uL (ref 140–400)
RBC: 4.04 MIL/uL (ref 3.80–5.10)
RDW: 14.2 % (ref 11.0–15.0)
WBC: 7.1 10*3/uL (ref 3.8–10.8)

## 2015-06-14 LAB — TSH: TSH: 1.41 m[IU]/L

## 2015-06-14 NOTE — Progress Notes (Signed)
Subjective:    Patient ID: Dorothy Dyer, female    DOB: 1976/01/23, 40 y.o.   MRN: [id number]  HPI 06/23/14 I have reviewed the patient's extensive past medical history. Patient has had chronic gastrointestinal problems. Patient had a gastric bypass. She then developed a small bowel obstruction. She's had numerous colonoscopies, endoscopies, ERCPs to try to determine the root cause of the patient's chronic abdominal pain. She has been diagnosed with dysmotility, irritable bowel syndrome, and chronic abdominal pain.  I reviewed the recent office visit here at my office with my physician assistant. Shortly thereafter she developed 15/10 lower abdominal pain complicated by diarrhea and nausea and vomiting. She went to the emergency room concerned that she had a bowel obstruction. CT of the abdomen and pelvis there revealed intestinal wall thickening and inflammation consistent with enteritis in the duodenum and small intestine either infectious versus inflammation. She was treated empirically for colitis with Cipro and Flagyl and was given narcotic pain medication and nausea medicine. Her pain is no better. She continues to have diarrhea nausea and vomiting. Her weight today is 14 pounds heavier than her last office visit. I weighed the patient personally and I know that the weight is correct today. She tells me that the weight is wrong. She is also concerned by a wound on her anterior right shin. There is a 1.5 cm erythematous central ulcer that is weeping clear serous fluid. She has +1 pitting edema in her legs and chronic venous insufficiency and venous stasis dermatitis on the leg. The wound was caused by a small trauma over 2 weeks ago however it will not heal due to the weeping edema.  Of note, the patient was declined by local gastroenterologist.  At that time, my plan was:  I am concerned the patient may have noninfectious colitis from possibly inflammatory bowel disease such as Crohn's disease.  She is in  moderate to severe pain. She is requesting more pain medication. I will try empirically treating the patient for inflammatory bowel disease with a prednisone taper. I explained this to the patient. This is not a diagnosis but honestly empiric therapy only. Recheck next week. If symptoms improve, she may benefit from chronic immunosuppression. I treated the wound on her right leg as a venous stasis ulcer by patient the patient in an Radio broadcast assistant. I recommended she elevate her legs is much as possible. I will recheck her next week to replace the Unna boot. I will also refer the patient to gastroenterology at [facility].  06/29/14 Patient had been on antibiotics for over 4 days when I saw her. The pain was worsening. After beginning the prednisone, the patient's symptoms improved dramatically. She states that the severe abdominal pain has now improved. She is now at her baseline chronic abdominal pain. The nausea has improved. She is currently on 20 mg a day of prednisone. The question is if she responding to the prednisone or a she responding to the antibiotics. She is also taking Ultram sparingly for abdominal pain. The wound on her right shin is much improved. It is now 5 mm x 5 mm. The edema in her legs is much improved.  However the patient has lost substantial weight. She states that she is taking fluids and eating normally. She denies failure to tolerate by mouth. However she has lost almost 18 pounds. Patient attributes this to stopping Lyrica on her own and also taking more fluid pills.  At that time, my plan was: Venous stasis ulcer  is improving. I replaced the [employer] boot. Recheck in one week. Given the patient's rapid weight loss, I encouraged her to drink more fluids and to discontinue her fluid pill to avoid dehydration. I will recheck her weight next week.. I will also check a TSH to make sure her levothyroxine is appropriately dosed. I do believe the patient may have inflammatory bowel  disease. I'm willing to begin to wean her down on the prednisone is much as possible. Decrease prednisone to 10 mg a day and recheck in one week. I will wean the patient off prednisone as tolerated. She is scheduled to see a gastroenterologist at [facility] on May 18. I also made a referral to a pain clinic. Recheck next week.  07/09/14 Patient's sedimentation rate last week was 4 which is normal. Her white blood cell count was stable at 12 and this was in the clinical setting of glucocorticoids. Given the fact the diagnosis was in question, we quickly wean the patient down to 10 mg of prednisone last week. She also stopped Cipro and Flagyl. Shortly thereafter her abdominal pain began to worsen. She always has a baseline abdominal pain however it has now risen to 10 on a scale of 10. She reports daily nausea. She reports inability to tolerate by mouth. She is still passing flatus. Her last bowel movement was yesterday. Patient was seen yesterday in this clinic by my physician's assistant. Her abdominal pain worsened. At that time Dorothy HaleMary Dyer resumed 40 mg of prednisone a day and also gave her a prescription for oxycodone. She is here today for follow-up.  She continues to lose weight.  At that time, my plan was:  It is possible the patient does have inflammatory bowel disease such as Crohn's, and when I quickly weaned her down on the steroids, the colitis resumed. If that is the case, high-dose steroids should calm down the patient quickly. The other possibility is this is infectious colitis that resumed after the patient discontinue the antibiotics. However she took a full 10 day course. Furthermore she is afebrile. I checked a stat CBC, and her white blood cell count was 13 which is essentially unchanged. Therefore, I believe infectious colitis is unlikely. The other possibility would be a bowel obstruction stemming from her numerous intestinal surgeries. I will send the patient for abdominal x-rays immediately to  rule out signs of bowel obstruction. If there is evidence of a bowel obstruction on x-rays, I will recommend that she go to the emergency room for a CT scan and management of a bowel obstruction. Another possibility would be malingering.  I will give the patient 80 mg of Depo-Medrol IM immediately to try to treat a possible Crohn's flare and I will send her immediately to get x-rays of the abdomen. If the x-rays are negative, I will treat the patient with high-dose steroids. I will slowly wean the patient off steroids, 5 mg per week, until she is seen by GI. Obviously if the x-rays are abnormal she would go to the emergency room. If the pain worsens she is to go to the emergency room for a CT scan of the abdomen.  07/13/14 X-ray revealed no evidence of a bowel obstruction. She's been taking 60 mg of prednisone over the weekend per day. She is here today for follow-up. Patient is doing much better since increasing the dose of prednisone. Her pain is back to her baseline. She looks much better today. Her weight has stabilized. She is no longer losing weight. Now  she is complaining of constipation however I attribute this to the narcotics she is taking. At that time, my plan was: I believe the patient has inflammatory bowel disease and I believe she has Crohn's. Therefore I'm going to try to decrease the patient on the prednisone by 10 mg every week. I want her to start 50 mg of prednisone by mouth daily for the next week and then decrease to 40 mg by mouth daily the second week and then 30 mg by mouth daily the third week and so on until she sees gastroenterology. I have warned the patient about prolonged steroid use and the risk of iatrogenic Cushing's disease, osteoporosis, weight gain, and diabetes. She understands and would like to try to wean off the steroids as her body will tolerate. Ultimately, I hope that GI can help [place] with a definitive diagnosis through endoscopy and help [place] find a maintenance medication  that will maintain remission. I recommended that she use MiraLAX to help manage the constipation. Also gave her samples of movantik 25 mg by mouth daily as needed for narcotic induced constipation. I would like to see the patient back in follow-up after she sees the gastroenterologist later this month.  I gave the patient a prescription for 60 mg of prednisone a day but I explained to the patient how I want her to take it. She understands that she is to decrease the dose to 50 mg a day and then by 10 mg each week until she sees gastroenterology.  09/17/14 DNKA last appt.  Saw GI at [facility].  Note is in EPIC.  They are trying to wean her off prednisone and schedule her for MRI enterography.  Patient was doing well on prednisone patient discontinued prednisone last week. Beginning 2 days ago she developed severe persistent watery diarrhea, increasing frequency of sharp crampy upper abdominal pain, and persistent daily nausea. Seems to coincide with weaning off the prednisone. She has the MRI scheduled for next week. She also has +1 pitting edema in both legs that is unresponsive to Lasix. She is not wearing compression stockings.  At that time, my plan was: Discontinue Lasix and start the patient on Bumex 2 mg by mouth daily. Also gave the patient a prescription for compression stockings 15-20 mmHg that are knee-high and instructed her to wear those on a daily basis to help control the swelling. I will give the patient 60 mg of Depo-Medrol can control the diarrhea and try to calm some of the exacerbation basically to try to buy some time until the patient can obtain MRI next week. I would prefer her not to be on prednisone at the time the MRI so that we can get a definitive diagnosis. Patient agrees with this plan. If symptoms worsen she knows to go to the emergency room   12/24/14 Patient [facility] last appt and rescheduled to today.  EGD was normal in August.  I have received no further correspondence from [facility]  regarding her follow up.  Was seen in the ER 10/6 with dental caries and was placed on amoxicillin.  Thankfully, she has not been to the ED for abd pain since spring.    I asked the patient about what happened with [facility]. Apparently Dorothy Dyer was not in her network and therefore she stopped seeing a gastroenterologist at [facility]. She never had the MRI enterography.  She only had the EGD.Marland Kitchen She has been using prednisone intermittently as she deems appropriate. She takes it whenever she has diarrhea. I  explained to the patient that this is not appropriate and she needs to discontinue doing that. I no longer want her on prednisone. To date no abnormality has been found in her GI workup. Apparently [employer] is inside her network. She would like to be referred to [facility] GI.  She continues to use her pain medication 3 times a day as well as her anxiety medication. She is due for urine drug screen. She is not receiving B12 injections. She is not taking vitamin D. Her B12 was found to be low in April. Her vitamin D was found to be very low. She also complains of pain in the posterior aspect of her left hip. The pain is located in the gluteus and in the upper hamstring. She has severe pain with range of motion of the left hip. She denies any falls or injury.  At that time, my plan was: Coordinating care only seems to be a challenge with this patient. Both the pain clinic and the GI referral seem to be lost and there has been no further follow-up.  Therefore I will obtain a urine drug screen today to monitor for compliance with her hydrocodone as well as her Xanax. These two meds seem to be keeping her from going to the emergency room. However I will make sure the patient is not abusing her diverting her medication. I will give the patient vitamin B12 1000 g  1 today.  She can return on a monthly basis for this injection. I recommended she start vitamin D 50,000 units by mouth every week for the next 6 months. Her  vitamin D levels 4 and April. I also recommended she start calcium 1200 mg a day to help prevent osteoporosis.. I will obtain an x-ray of the left hip. At the present time I believe this is more likely a muscular strain. Her exam is not consistent with intra-articular pathology. I will continue to prescribe her pain medication as well as her anxiety medication as long as the urine drug screen shows no evidence of abuse or diversion. I did place a referral to [facility] gastroenterology  01/18/15  since that last visit, the patient has missed 2 appointments with me both [facility].    Her urine drug screen revealed only benzodiazepines and no opiates. Please see the results in the lab section. Therefore I decreased her pain medication from 90 pills a month for 45 pills a month. She is here today complaining of diffuse abdominal pain, diffuse musculoskeletal pain. She complains of pain in both knees and in her left hip. She does have pitting on her fingernails consistent with psoriasis and she raises the possibility of psoriatic arthritis. At her previous specialist she was being treated for psoriatic arthritis. She questions if this can be the reason that both her knees hurt so badly and her hip hurts so badly. X-ray of left hip reveal mild degenerative change but pain is out of proportion to findings on x-ray. X-rays of the knee obtained last year in December revealed no arthritis or skeletal pathology. She is again complaining of pain in both knees and in her left hip. She is pointing to increase the amount of her pain medication. I explained to the patient that her behavior makes me uncomfortable. Her frequent missed appointments, her negative urine drug screen, the fact that she did not follow-up with a specialist for over 6 months because her insurance did not cover it yet she reports daily abdominal pain that is severe  and requires her to take the pain medication for one 5 times a day seems inconsistent. She  does have a peeling scaly rash on the palms of both hands and on the soles of her left foot.  At that time, my plan was:  I will complete the workup for polyarthralgia by obtaining x-rays of the left knee. I will certainly be willing to refer the patient to a rheumatologist for evaluation for possible signs of psoriatic arthritis. However today, her pain is out of proportion to the findings shown on x-ray. I will not increase the amount of her pain medication for the reasons I discussed in history of present illness. Furthermore I told the patient that she missed another appointment [facility]) , she will be dismissed from the practice as this prevents me from taking care of her and my other patients appropriately. 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   06/14/15 Since that appointment, the patient was diagnosed with avascular necrosis of the left hip. Orthopedics recommended a total hip replacement. I provided the patient medical clearance for this procedure. However the procedure has still not happen. The patient's surgery is on hold because she has not receive clearance from her dentist yet to proceed with hip replacement. Apparently she has a fracture to the has periodically become infected. Her dentist has recommended removal of the tooth under the care of an oral maxillofacial surgeon. The orthopedist will not operate until that tooth is removed and the Dorothy Dyer is provided total clearance. The patient is working now with the oral maxillofacial surgeon to schedule a time to have this tooth removed. She is also seen a rheumatologist who diagnosed her with psoriasis. She does have psoriatic changes to the fingernails on both hands. This could potentially be contributing to her polyarthralgia. The rheumatologist is recommended he married injections however they will not perform the injections until after she is healed from her hip replacement due to possibly  complicating the hip replacement. She also reports palpitations. Her heart rate is 100 bpm. She's been taking levothyroxine 137 g daily and not alternating it with 125 as she is supposed to. She also reports diarrhea and persistent abdominal pain that has been well documented. She has a appointment scheduled with [facility] in the next month  Past Medical History  Diagnosis Date  . Thyroid disease   . Cystitis   . Anemia   . Anxiety   . Arthritis   . Clotting disorder (HCC)     undetermined  . Acid reflux   . Neuromuscular disorder (HCC)   . Hypertension   . Osteoporosis   . 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 Cholecystectomy    . Small intestine surgery  2004    exploratory  . Gastric bypass  05/2001  . Esophagogastroduodenoscopy  10/2014    at [facility], was normal. Dr. Merri Brunette   Current Outpatient Prescriptions on File Prior to Visit  Medication Sig Dispense Refill  . ACIPHEX 20 MG tablet TAKE 1 TABLET BY MOUTH TWICE A DAY 60 tablet 11  . ALPRAZolam (XANAX) 0.5 MG tablet TAKE 1 TABLET TWICE A DAY AS NEEDED 60 tablet 2  . celecoxib (CELEBREX) 200 MG capsule Take 1 capsule (200 mg total) by mouth 2 (two) times daily. 30 capsule 0  . furosemide (LASIX) 40 MG tablet Take 1 tablet (40 mg total) by mouth daily as needed  for fluid. 30 tablet 2  . HYDROcodone-acetaminophen (NORCO) 10-325 MG tablet Take 1 tablet by mouth 3 (three) times daily as needed. 45 tablet 0  . levothyroxine (SYNTHROID, LEVOTHROID) 125 MCG tablet Take 1 tablet (125 mcg total) by mouth daily. 90 tablet 3  . levothyroxine (SYNTHROID, LEVOTHROID) 137 MCG tablet Take 1 tablet (137 mcg total) by mouth daily before breakfast. Take every other day, alternate with 125 mcg dose. 15 tablet 5  . LYRICA 225 MG capsule TAKE ONE CAPSULE BY MOUTH TWICE A DAY 60 capsule 0  . metoprolol succinate (TOPROL-XL) 50 MG 24 hr tablet Take 1 tablet (50 mg total) by mouth daily. Take with  or immediately following a meal. 90 tablet 3  . mometasone (ELOCON) 0.1 % cream Apply 1 application topically daily. 45 g 0  . nortriptyline (PAMELOR) 75 MG capsule TAKE 1 CAPSULE (75 MG TOTAL) BY MOUTH AT BEDTIME. 30 capsule 5  . ondansetron (ZOFRAN) 4 MG tablet TAKE 1 TABLET (4 MG TOTAL) BY MOUTH EVERY 8 (EIGHT) HOURS AS NEEDED FOR NAUSEA OR VOMITING. 20 tablet 0  . potassium chloride SA (K-DUR,KLOR-CON) 20 MEQ tablet Take 1 tablet (20 mEq total) by mouth 2 (two) times daily. 60 tablet 3  . simethicone (MYLICON) 125 MG chewable tablet Chew 125 mg by mouth every 6 (six) hours as needed for flatulence.    . sucralfate (CARAFATE) 1 G tablet Take 1 tablet by mouth 4 (four) times daily.  3  . topiramate (TOPAMAX) 50 MG tablet TAKE 1 TABLET BY MOUTH AT BEDTIME 30 tablet 2   No current facility-administered medications on file prior to visit.   Allergies  Allergen Reactions  . Ibuprofen Nausea Only    Bad stomach pains  . Toradol [Ketorolac Tromethamine] Anxiety  . Aspirin Hives  . Cleocin [Clindamycin Hcl] Itching and Rash  . Clindamycin Rash   Social History   Social History  . Marital Status: Divorced    Spouse Name: N/A  . Number of Children: N/A  . Years of Education: N/A   Occupational History  . Not on file.   Social History Main Topics  . Smoking status: Current Every Day Smoker -- 0.50 packs/day    Types: Cigarettes  . Smokeless tobacco: Never Used  . Alcohol Use: 6.0 oz/week    10 Shots of liquor per week  . Drug Use: No  . Sexual Activity: Yes    Birth Control/ Protection: Pill   Other Topics Concern  . Not on file   Social History Narrative      Review of Systems  All other systems reviewed and are negative.      Objective:   Physical Exam  Constitutional: She appears well-developed and well-nourished. No distress.  Cardiovascular: Normal rate, regular rhythm and normal heart sounds.   No murmur heard. Pulmonary/Chest: Effort normal and breath sounds  normal.  Abdominal: Soft. Bowel sounds are normal. She exhibits no distension. There is no tenderness. There is no rebound and no guarding.  Musculoskeletal: She exhibits no edema.       Left hip: She exhibits decreased range of motion, decreased strength and tenderness. She exhibits no swelling, no crepitus and no deformity.  Skin: She is not diaphoretic. No erythema.          Assessment & Plan:  Other specified hypothyroidism - Plan: CBC with Differential/Platelet, TSH, COMPLETE METABOLIC PANEL WITH GFR  Chronic abdominal pain  Left hip pain  Psoriasis In the past for abdominal pain did improve  with prednisone raising the concern for inflammatory bowel disease possibly Crohn's. However she has never received a definitive diagnosis due to failing to follow up with GI at [facility]. This would likely benefit from the Humira.  Certainly the psoriasis would benefit from humira and this may even help with polyarthralgia if she in fact has psoriatic arthritis. Therefore the patient would benefit from having the tooth removed immediately so that she can have her left hip replacement to develop avascular necrosis in the left hip. Once that is healed, I would recommend immediately following up with rheumatology to begin Humira injections for her psoriasis and also possibly her polyarthralgia. Meanwhile decrease levothyroxine to 125 g by mouth daily and check baseline lab work. Continue her pain medication 3 times a day for chronic abdominal pain as well as her left hip pain.

## 2015-06-23 ENCOUNTER — Other Ambulatory Visit: Payer: Self-pay | Admitting: Family Medicine

## 2015-06-24 ENCOUNTER — Telehealth: Payer: Self-pay | Admitting: Family Medicine

## 2015-06-24 ENCOUNTER — Other Ambulatory Visit: Payer: Self-pay | Admitting: Family Medicine

## 2015-06-24 DIAGNOSIS — R109 Unspecified abdominal pain: Principal | ICD-10-CM

## 2015-06-24 DIAGNOSIS — G8929 Other chronic pain: Secondary | ICD-10-CM

## 2015-06-24 MED ORDER — HYDROCODONE-ACETAMINOPHEN 10-325 MG PO TABS: 1.0000 | ORAL_TABLET | Freq: Three times a day (TID) | ORAL | Status: DC | PRN

## 2015-06-24 NOTE — Telephone Encounter (Signed)
Patient needs rx for her vicodin  [phone]

## 2015-06-24 NOTE — Telephone Encounter (Signed)
Medication called to pharmacy. 

## 2015-06-24 NOTE — Telephone Encounter (Signed)
Ok to refill??  Last office visit 06/14/2015.  Last refill 05/26/2015.

## 2015-06-24 NOTE — Telephone Encounter (Signed)
Ok to refill??  Last office visit 06/14/2015.  Last refill 05/21/2015.

## 2015-06-24 NOTE — Telephone Encounter (Signed)
Prescription printed and patient made aware to come to office to pick up on 06/25/2015.

## 2015-06-24 NOTE — Telephone Encounter (Signed)
ok 

## 2015-06-25 ENCOUNTER — Telehealth: Payer: Self-pay | Admitting: Family Medicine

## 2015-06-25 NOTE — Telephone Encounter (Signed)
Patient is calling to see if she could get letter to say that is medically necessary to get your tooth surgically pulled, so she can move on with hip replacement, she cannot have this done until the tooth is pulled  [phone] please call her and let her know if dr pickard can do this

## 2015-06-28 ENCOUNTER — Encounter: Payer: Self-pay | Admitting: Family Medicine

## 2015-06-28 NOTE — Telephone Encounter (Signed)
Pt aware to p/u letter 

## 2015-06-28 NOTE — Telephone Encounter (Signed)
Letter is on my desk

## 2015-07-22 ENCOUNTER — Other Ambulatory Visit: Payer: Self-pay | Admitting: Family Medicine

## 2015-07-22 ENCOUNTER — Telehealth: Payer: Self-pay | Admitting: Family Medicine

## 2015-07-22 DIAGNOSIS — G8929 Other chronic pain: Secondary | ICD-10-CM

## 2015-07-22 DIAGNOSIS — R109 Unspecified abdominal pain: Principal | ICD-10-CM

## 2015-07-22 NOTE — Telephone Encounter (Signed)
ok 

## 2015-07-22 NOTE — Telephone Encounter (Signed)
Ok to refill??  Last office visit 06/14/2015.  Last refill 04/27/2015, #2 refills.

## 2015-07-22 NOTE — Telephone Encounter (Signed)
Medication called to pharmacy. 

## 2015-07-22 NOTE — Telephone Encounter (Signed)
Ok to refill 

## 2015-07-22 NOTE — Telephone Encounter (Signed)
Pt is requesting a refill of Hydrocodone 10-325 [phone]

## 2015-07-23 MED ORDER — HYDROCODONE-ACETAMINOPHEN 10-325 MG PO TABS: 1.0000 | ORAL_TABLET | Freq: Three times a day (TID) | ORAL | Status: DC | PRN

## 2015-07-23 NOTE — Telephone Encounter (Signed)
RX printed, left up front and patient aware to pick up after 2 pm  

## 2015-07-26 ENCOUNTER — Other Ambulatory Visit: Payer: Self-pay | Admitting: Family Medicine

## 2015-07-26 NOTE — Telephone Encounter (Signed)
ok 

## 2015-07-26 NOTE — Telephone Encounter (Signed)
Ok to refill??  Last office visit 06/14/2015.  Last refill 06/24/2015.

## 2015-07-26 NOTE — Telephone Encounter (Signed)
Medication called to pharmacy. 

## 2015-07-30 ENCOUNTER — Ambulatory Visit (INDEPENDENT_AMBULATORY_CARE_PROVIDER_SITE_OTHER): Payer: Medicare Other | Admitting: Family Medicine

## 2015-07-30 VITALS — BP 120/68 | HR 104 | Temp 97.9°F | Resp 18 | Wt 185.0 lb

## 2015-07-30 DIAGNOSIS — A599 Trichomoniasis, unspecified: Secondary | ICD-10-CM

## 2015-07-30 DIAGNOSIS — Z202 Contact with and (suspected) exposure to infections with a predominantly sexual mode of transmission: Secondary | ICD-10-CM

## 2015-07-30 DIAGNOSIS — Z7251 High risk heterosexual behavior: Secondary | ICD-10-CM

## 2015-07-30 LAB — WET PREP FOR TRICH, YEAST, CLUE
TRICH WET PREP: NONE SEEN
YEAST WET PREP: NONE SEEN

## 2015-07-30 LAB — HIV ANTIBODY (ROUTINE TESTING W REFLEX): HIV: NONREACTIVE

## 2015-07-30 LAB — PREGNANCY, URINE: Preg Test, Ur: NEGATIVE

## 2015-07-30 MED ORDER — METRONIDAZOLE 500 MG PO TABS: 2000.0000 mg | ORAL_TABLET | Freq: Once | ORAL | Status: DC

## 2015-07-30 MED ORDER — AZITHROMYCIN 250 MG PO TABS: 1000.0000 mg | ORAL_TABLET | Freq: Once | ORAL | Status: DC

## 2015-07-30 MED ORDER — LEVONORG-ETH ESTRAD TRIPHASIC PO TABS: 1.0000 | ORAL_TABLET | Freq: Every day | ORAL | Status: DC

## 2015-07-30 NOTE — Progress Notes (Signed)
Subjective:    Patient ID: Dorothy Dyer, female    DOB: 01-27-1976, 40 y.o.   MRN: [id number]  HPI  Boyfriend has recently been diagnosed with chlamydia and trichomoniasis. Patient is here today for testing. She does report dysuria and urgency. Past Medical History  Diagnosis Date  . Thyroid disease   . Cystitis   . Anemia   . Anxiety   . Arthritis   . Clotting disorder (HCC)     undetermined  . Acid reflux   . Neuromuscular disorder (HCC)   . Hypertension   . Osteoporosis   . 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 Psoriasis   . AVN of femur (HCC)     left hip   Past Surgical History  Procedure Laterality Date  . Cholecystectomy    . Small intestine surgery  2004    exploratory  . Gastric bypass  05/2001  . Esophagogastroduodenoscopy  10/2014    at [facility], was normal. Dr. Merri Brunette   Current Outpatient Prescriptions on File Prior to Visit  Medication Sig Dispense Refill  . ACIPHEX 20 MG tablet TAKE 1 TABLET BY MOUTH TWICE A DAY 60 tablet 11  . ALPRAZolam (XANAX) 0.5 MG tablet TAKE 1 TABLET BY MOUTH 2 TIMES DAILY AS NEEDED FOR ANXIETY 60 tablet 2  . celecoxib (CELEBREX) 200 MG capsule Take 1 capsule (200 mg total) by mouth 2 (two) times daily. 30 capsule 0  . furosemide (LASIX) 40 MG tablet Take 1 tablet (40 mg total) by mouth daily as needed for fluid. 30 tablet 2  . HYDROcodone-acetaminophen (NORCO) 10-325 MG tablet Take 1 tablet by mouth 3 (three) times daily as needed. 45 tablet 0  . levothyroxine (SYNTHROID, LEVOTHROID) 125 MCG tablet Take 1 tablet (125 mcg total) by mouth daily. 90 tablet 3  . levothyroxine (SYNTHROID, LEVOTHROID) 137 MCG tablet Take 1 tablet (137 mcg total) by mouth daily before breakfast. Take every other day, alternate with 125 mcg dose. 15 tablet 5  . LYRICA 225 MG capsule TAKE ONE CAPSULE BY MOUTH TWICE A DAY 60 capsule 0  . metoprolol succinate (TOPROL-XL) 50 MG 24 hr tablet TAKE 1 TABLET (50 MG TOTAL) BY MOUTH DAILY. TAKE WITH OR  IMMEDIATELY FOLLOWING A MEAL. 90 tablet 3  . mometasone (ELOCON) 0.1 % cream Apply 1 application topically daily. 45 g 0  . nortriptyline (PAMELOR) 75 MG capsule TAKE 1 CAPSULE (75 MG TOTAL) BY MOUTH AT BEDTIME. 30 capsule 5  . ondansetron (ZOFRAN) 4 MG tablet TAKE 1 TABLET (4 MG TOTAL) BY MOUTH EVERY 8 (EIGHT) HOURS AS NEEDED FOR NAUSEA OR VOMITING. 20 tablet 0  . potassium chloride SA (K-DUR,KLOR-CON) 20 MEQ tablet Take 1 tablet (20 mEq total) by mouth 2 (two) times daily. 60 tablet 3  . simethicone (MYLICON) 125 MG chewable tablet Chew 125 mg by mouth every 6 (six) hours as needed for flatulence.    . sucralfate (CARAFATE) 1 G tablet Take 1 tablet by mouth 4 (four) times daily.  3  . topiramate (TOPAMAX) 50 MG tablet TAKE 1 TABLET BY MOUTH AT BEDTIME 30 tablet 2   No current facility-administered medications on file prior to visit.   Allergies  Allergen Reactions  . Ibuprofen Nausea Only    Bad stomach pains  . Toradol [Ketorolac Tromethamine] Anxiety  . Aspirin Hives  . Cleocin [Clindamycin Hcl] Itching and Rash  . Clindamycin Rash   Social History   Social History  . Marital Status: Divorced    Spouse  Name: N/A  . Number of Children: N/A  . Years of Education: N/A   Occupational History  . Not on file.   Social History Main Topics  . Smoking status: Current Every Day Smoker -- 0.50 packs/day    Types: Cigarettes  . Smokeless tobacco: Never Used  . Alcohol Use: 6.0 oz/week    10 Shots of liquor per week  . Drug Use: No  . Sexual Activity: Yes    Birth Control/ Protection: Pill   Other Topics Concern  . Not on file   Social History Narrative     Review of Systems  All other systems reviewed and are negative.      Objective:   Physical Exam  Cardiovascular: Regular rhythm.   Pulmonary/Chest: Effort normal and breath sounds normal.  Genitourinary: Cervix exhibits discharge. Right adnexum displays no tenderness. Left adnexum displays no tenderness. No erythema  or bleeding in the vagina. Vaginal discharge found.  Vitals reviewed.         Assessment & Plan:  Exposure to chlamydia - Plan: GC/Chlamydia Probe Amp, HIV antibody, WET PREP FOR TRICH, YEAST, CLUE, DISCONTINUED: azithromycin (ZITHROMAX) 250 MG tablet  Unprotected sexual intercourse - Plan: Pregnancy, urine  Trichimoniasis - Plan: metroNIDAZOLE (FLAGYL) 500 MG tablet  I will go ahead and treat the patient prophylactically with azithromycin 1000 mg by mouth 1 to cover chlamydia and Flagyl 2000 mg by mouth 1 to treat trichomoniasis. We will also screen the patient for HIV with a blood test.

## 2015-08-02 ENCOUNTER — Other Ambulatory Visit: Payer: Self-pay | Admitting: Family Medicine

## 2015-08-02 NOTE — Telephone Encounter (Signed)
Refill appropriate and filled per protocol. 

## 2015-08-04 ENCOUNTER — Other Ambulatory Visit: Payer: Self-pay | Admitting: Family Medicine

## 2015-08-11 ENCOUNTER — Encounter: Payer: Self-pay | Admitting: Family Medicine

## 2015-08-23 ENCOUNTER — Telehealth: Payer: Self-pay | Admitting: Family Medicine

## 2015-08-23 DIAGNOSIS — G8929 Other chronic pain: Secondary | ICD-10-CM

## 2015-08-23 DIAGNOSIS — R109 Unspecified abdominal pain: Principal | ICD-10-CM

## 2015-08-23 NOTE — Telephone Encounter (Signed)
Ok to refill 

## 2015-08-23 NOTE — Telephone Encounter (Signed)
Patient is calling to get rx for her  Hydrocodone if possible would need to pick it up asap she is in a lot of pain  [phone]

## 2015-08-23 NOTE — Telephone Encounter (Signed)
ok 

## 2015-08-24 MED ORDER — HYDROCODONE-ACETAMINOPHEN 10-325 MG PO TABS: 1.0000 | ORAL_TABLET | Freq: Three times a day (TID) | ORAL | Status: DC | PRN

## 2015-08-24 NOTE — Telephone Encounter (Signed)
Pt called and is aware to pu rx

## 2015-08-24 NOTE — Telephone Encounter (Signed)
Tried to call - no answer and no vm - Rx has been printed and up front for pt

## 2015-08-25 ENCOUNTER — Other Ambulatory Visit: Payer: Self-pay | Admitting: Family Medicine

## 2015-08-25 ENCOUNTER — Telehealth: Payer: Self-pay | Admitting: Family Medicine

## 2015-08-25 NOTE — Telephone Encounter (Signed)
Ok to refill??  Last office visit 07/30/2015.  Last refill 07/26/2015.

## 2015-08-25 NOTE — Telephone Encounter (Signed)
Patient has questions about her enpresse  plese call her at [phone] (H)

## 2015-08-26 NOTE — Telephone Encounter (Signed)
ok 

## 2015-08-27 NOTE — Telephone Encounter (Signed)
rx called in

## 2015-09-03 NOTE — Telephone Encounter (Signed)
Pt wanted to know if she could restart her BCP - per Dr. Demetrius CharityP ok to restart - pt aware

## 2015-09-22 ENCOUNTER — Telehealth: Payer: Self-pay | Admitting: Family Medicine

## 2015-09-22 DIAGNOSIS — R109 Unspecified abdominal pain: Principal | ICD-10-CM

## 2015-09-22 DIAGNOSIS — G8929 Other chronic pain: Secondary | ICD-10-CM

## 2015-09-22 NOTE — Telephone Encounter (Signed)
Patient needs rx for vicodin  [phone]

## 2015-09-22 NOTE — Telephone Encounter (Signed)
Ok to refill 

## 2015-09-23 MED ORDER — HYDROCODONE-ACETAMINOPHEN 10-325 MG PO TABS: 1.0000 | ORAL_TABLET | Freq: Three times a day (TID) | ORAL | Status: DC | PRN

## 2015-09-23 NOTE — Telephone Encounter (Signed)
ok 

## 2015-09-23 NOTE — Telephone Encounter (Signed)
RX printed, left up front and patient aware to pick up in am 

## 2015-10-08 ENCOUNTER — Telehealth: Payer: Self-pay | Admitting: Family Medicine

## 2015-10-08 ENCOUNTER — Other Ambulatory Visit: Payer: Self-pay | Admitting: Family Medicine

## 2015-10-08 NOTE — Telephone Encounter (Signed)
Ok to refill??  Last office visit 07/30/2015.  Last refill 08/27/2015.

## 2015-10-08 NOTE — Telephone Encounter (Signed)
Ok to refill 

## 2015-10-08 NOTE — Telephone Encounter (Signed)
ok 

## 2015-10-08 NOTE — Telephone Encounter (Signed)
Was ok'd in another encounter - med called to pharm

## 2015-10-08 NOTE — Telephone Encounter (Signed)
Patient called requesting a refill on her lyrica 225 mg [facility] on [address]  CB# [phone]

## 2015-10-14 ENCOUNTER — Other Ambulatory Visit: Payer: Self-pay | Admitting: Family Medicine

## 2015-10-14 NOTE — Telephone Encounter (Signed)
Refill appropriate and filled per protocol. 

## 2015-10-25 ENCOUNTER — Other Ambulatory Visit: Payer: Self-pay | Admitting: Family Medicine

## 2015-10-25 ENCOUNTER — Telehealth: Payer: Self-pay | Admitting: Family Medicine

## 2015-10-25 DIAGNOSIS — Z9884 Bariatric surgery status: Secondary | ICD-10-CM

## 2015-10-25 DIAGNOSIS — G8929 Other chronic pain: Secondary | ICD-10-CM

## 2015-10-25 DIAGNOSIS — K219 Gastro-esophageal reflux disease without esophagitis: Secondary | ICD-10-CM

## 2015-10-25 DIAGNOSIS — R109 Unspecified abdominal pain: Secondary | ICD-10-CM

## 2015-10-25 MED ORDER — HYDROCODONE-ACETAMINOPHEN 10-325 MG PO TABS: 1.0000 | ORAL_TABLET | Freq: Three times a day (TID) | ORAL | 0 refills | Status: DC | PRN

## 2015-10-25 NOTE — Telephone Encounter (Signed)
RX printed and pt left message to pick up this afternoon

## 2015-10-25 NOTE — Telephone Encounter (Signed)
LRF 09/23/15 #45  LOV 07/30/15  OK refill?

## 2015-10-25 NOTE — Telephone Encounter (Signed)
ok 

## 2015-10-25 NOTE — Telephone Encounter (Signed)
Patient is calling to get another referral for [facility] in [place] if possible, patient has had to reschedule three times, therefor a new referral is required  [phone]

## 2015-11-11 ENCOUNTER — Other Ambulatory Visit: Payer: Self-pay | Admitting: Family Medicine

## 2015-11-17 ENCOUNTER — Other Ambulatory Visit: Payer: Self-pay | Admitting: Family Medicine

## 2015-11-17 NOTE — Telephone Encounter (Signed)
Ok to refill 

## 2015-11-18 NOTE — Telephone Encounter (Signed)
Medication called to pharmacy. 

## 2015-11-18 NOTE — Telephone Encounter (Signed)
ok 

## 2015-11-22 ENCOUNTER — Other Ambulatory Visit: Payer: Self-pay | Admitting: Family Medicine

## 2015-11-23 ENCOUNTER — Telehealth: Payer: Self-pay | Admitting: Family Medicine

## 2015-11-23 DIAGNOSIS — G8929 Other chronic pain: Secondary | ICD-10-CM

## 2015-11-23 DIAGNOSIS — R109 Unspecified abdominal pain: Principal | ICD-10-CM

## 2015-11-23 MED ORDER — HYDROCODONE-ACETAMINOPHEN 10-325 MG PO TABS: 1.0000 | ORAL_TABLET | Freq: Three times a day (TID) | ORAL | 0 refills | Status: DC | PRN

## 2015-11-23 NOTE — Telephone Encounter (Signed)
Ok to refill 

## 2015-11-23 NOTE — Telephone Encounter (Signed)
RX printed, left up front and patient's brother aware that pt may pick up rx tomorrow. Per her brother pt does not have minutes on her phone but can text so he will text her and let her know.

## 2015-11-23 NOTE — Telephone Encounter (Signed)
Patient is calling to get rx for her hydrocodone, she is not sure if it is time, but is in a lot of pain due to a couple of falls she has had

## 2015-11-23 NOTE — Telephone Encounter (Signed)
Ok with refill if it has been 1 month.  Otherwise, no early refills.

## 2015-11-24 ENCOUNTER — Telehealth: Payer: Self-pay | Admitting: Family Medicine

## 2015-11-24 ENCOUNTER — Other Ambulatory Visit: Payer: Self-pay | Admitting: Family Medicine

## 2015-11-24 MED ORDER — LEVOTHYROXINE SODIUM 125 MCG PO TABS: 125.0000 ug | ORAL_TABLET | Freq: Every day | ORAL | 3 refills | Status: DC

## 2015-11-24 NOTE — Telephone Encounter (Signed)
PATIENT NEEDS REFILL ON HER LEVOTHYROXINE, [facility], HAS CALLED PHARMACY BUT NOTHING IN SYSTEM  [phone]

## 2015-11-24 NOTE — Telephone Encounter (Signed)
Medication called/sent to requested pharmacy  

## 2015-11-29 ENCOUNTER — Ambulatory Visit (INDEPENDENT_AMBULATORY_CARE_PROVIDER_SITE_OTHER): Payer: Medicare Other | Admitting: Family Medicine

## 2015-11-29 VITALS — BP 136/90 | HR 84 | Temp 97.5°F | Resp 18 | Ht 61.5 in | Wt 189.0 lb

## 2015-11-29 DIAGNOSIS — R109 Unspecified abdominal pain: Secondary | ICD-10-CM

## 2015-11-29 DIAGNOSIS — R000 Tachycardia, unspecified: Secondary | ICD-10-CM

## 2015-11-29 DIAGNOSIS — G8929 Other chronic pain: Secondary | ICD-10-CM | POA: Diagnosis not present

## 2015-11-29 DIAGNOSIS — M25552 Pain in left hip: Secondary | ICD-10-CM

## 2015-11-29 LAB — CBC WITH DIFFERENTIAL/PLATELET
BASOS ABS: 0 {cells}/uL (ref 0–200)
Basophils Relative: 0 %
EOS PCT: 1 %
Eosinophils Absolute: 51 cells/uL (ref 15–500)
HCT: 35.2 % (ref 35.0–45.0)
Hemoglobin: 11.5 g/dL — ABNORMAL LOW (ref 12.0–15.0)
LYMPHS PCT: 28 %
Lymphs Abs: 1428 cells/uL (ref 850–3900)
MCH: 32.6 pg (ref 27.0–33.0)
MCHC: 32.7 g/dL (ref 32.0–36.0)
MCV: 99.7 fL (ref 80.0–100.0)
MONOS PCT: 10 %
MPV: 10.9 fL (ref 7.5–12.5)
Monocytes Absolute: 510 cells/uL (ref 200–950)
NEUTROS ABS: 3111 {cells}/uL (ref 1500–7800)
Neutrophils Relative %: 61 %
PLATELETS: 169 10*3/uL (ref 140–400)
RBC: 3.53 MIL/uL — ABNORMAL LOW (ref 3.80–5.10)
RDW: 15.4 % — AB (ref 11.0–15.0)
WBC: 5.1 10*3/uL (ref 3.8–10.8)

## 2015-11-29 LAB — TSH: TSH: 0.58 mIU/L

## 2015-11-29 MED ORDER — HYDROCODONE-ACETAMINOPHEN 10-325 MG PO TABS: 1.0000 | ORAL_TABLET | Freq: Three times a day (TID) | ORAL | 0 refills | Status: DC | PRN

## 2015-11-29 NOTE — Progress Notes (Signed)
Subjective:    Patient ID: Dorothy Dyer, female    DOB: 07/31/1975, 40 y.o.   MRN: [id number]  HPI4/12/16 I have reviewed the patient's extensive past medical history. Patient has had chronic gastrointestinal problems. Patient had a gastric bypass. She then developed a small bowel obstruction. She's had numerous colonoscopies, endoscopies, ERCPs to try to determine the root cause of the patient's chronic abdominal pain. She has been diagnosed with dysmotility, irritable bowel syndrome, and chronic abdominal pain.  I reviewed the recent office visit here at my office with my physician assistant. Shortly thereafter she developed 15/10 lower abdominal pain complicated by diarrhea and nausea and vomiting. She went to the emergency room concerned that she had a bowel obstruction. CT of the abdomen and pelvis there revealed intestinal wall thickening and inflammation consistent with enteritis in the duodenum and small intestine either infectious versus inflammation. She was treated empirically for colitis with Cipro and Flagyl and was given narcotic pain medication and nausea medicine. Her pain is no better. She continues to have diarrhea nausea and vomiting. Her weight today is 14 pounds heavier than her last office visit. I weighed the patient personally and I know that the weight is correct today. She tells me that the weight is wrong. She is also concerned by a wound on her anterior right shin. There is a 1.5 cm erythematous central ulcer that is weeping clear serous fluid. She has +1 pitting edema in her legs and chronic venous insufficiency and venous stasis dermatitis on the leg. The wound was caused by a small trauma over 2 weeks ago however it will not heal due to the weeping edema.  Of note, the patient was declined by local gastroenterologist.  At that time, my plan was:  I am concerned the patient may have noninfectious colitis from possibly inflammatory bowel disease such as Crohn's disease. She  is in  moderate to severe pain. She is requesting more pain medication. I will try empirically treating the patient for inflammatory bowel disease with a prednisone taper. I explained this to the patient. This is not a diagnosis but honestly empiric therapy only. Recheck next week. If symptoms improve, she may benefit from chronic immunosuppression. I treated the wound on her right leg as a venous stasis ulcer by patient the patient in an Radio broadcast assistantUnna boot. I recommended she elevate her legs is much as possible. I will recheck her next week to replace the Unna boot. I will also refer the patient [facility].  06/29/14 Patient had been on antibiotics for over 4 days when I saw her. The pain was worsening. After beginning the prednisone, the patient's symptoms improved dramatically. She states that the severe abdominal pain has now improved. She is now at her baseline chronic abdominal pain. The nausea has improved. She is currently on 20 mg a day of prednisone. The question is if she responding to the prednisone or a she responding to the antibiotics. She is also taking Ultram sparingly for abdominal pain. The wound on her right shin is much improved. It is now 5 mm x 5 mm. The edema in her legs is much improved.  However the patient has lost substantial weight. She states that she is taking fluids and eating normally. She denies failure to tolerate by mouth. However she has lost almost 18 pounds. Patient attributes this to stopping Lyrica on her own and also taking more fluid pills.  At that time, my plan was: Venous stasis ulcer is  improving. I replaced the [employer] boot. Recheck in one week. Given the patient's rapid weight loss, I encouraged her to drink more fluids and to discontinue her fluid pill to avoid dehydration. I will recheck her weight next week.. I will also check a TSH to make sure her levothyroxine is appropriately dosed. I do believe the patient may have inflammatory bowel  disease. I'm willing to begin to wean her down on the prednisone is much as possible. Decrease prednisone to 10 mg a day and recheck in one week. I will wean the patient off prednisone as tolerated. She is [facility]. I also made a referral to a pain clinic. Recheck next week.  07/09/14 Patient's sedimentation rate last week was 4 which is normal. Her white blood cell count was stable at 12 and this was in the clinical setting of glucocorticoids. Given the fact the diagnosis was in question, we quickly wean the patient down to 10 mg of prednisone last week. She also stopped Cipro and Flagyl. Shortly thereafter her abdominal pain began to worsen. She always has a baseline abdominal pain however it has now risen to 10 on a scale of 10. She reports daily nausea. She reports inability to tolerate by mouth. She is still passing flatus. Her last bowel movement was yesterday. Patient was seen yesterday in this clinic by my physician's assistant. Her abdominal pain worsened. At that time [name] resumed 40 mg of prednisone a day and also gave her a prescription for oxycodone. She is here today for follow-up.  She continues to lose weight.  At that time, my plan was:  It is possible the patient does have inflammatory bowel disease such as Crohn's, and when I quickly weaned her down on the steroids, the colitis resumed. If that is the case, high-dose steroids should calm down the patient quickly. The other possibility is this is infectious colitis that resumed after the patient discontinue the antibiotics. However she took a full 10 day course. Furthermore she is afebrile. I checked a stat CBC, and her white blood cell count was 13 which is essentially unchanged. Therefore, I believe infectious colitis is unlikely. The other possibility would be a bowel obstruction stemming from her numerous intestinal surgeries. I will send the patient for abdominal x-rays immediately to  rule out signs of bowel obstruction. If there is evidence of a bowel obstruction on x-rays, I will recommend that she go to the emergency room for a CT scan and management of a bowel obstruction. Another possibility would be malingering.  I will give the patient 80 mg of Depo-Medrol IM immediately to try to treat a possible Crohn's flare and I will send her immediately to get x-rays of the abdomen. If the x-rays are negative, I will treat the patient with high-dose steroids. I will slowly wean the patient off steroids, 5 mg per week, until she is seen by GI. Obviously if the x-rays are abnormal she would go to the emergency room. If the pain worsens she is to go to the emergency room for a CT scan of the abdomen.  07/13/14 X-ray revealed no evidence of a bowel obstruction. She's been taking 60 mg of prednisone over the weekend per day. She is here today for follow-up. Patient is doing much better since increasing the dose of prednisone. Her pain is back to her baseline. She looks much better today. Her weight has stabilized. She is no longer losing weight. Now she  is complaining of constipation however I attribute this to the narcotics she is taking. At that time, my plan was: I believe the patient has inflammatory bowel disease and I believe she has Crohn's. Therefore I'm going to try to decrease the patient on the prednisone by 10 mg every week. I want her to start 50 mg of prednisone by mouth daily for the next week and then decrease to 40 mg by mouth daily the second week and then 30 mg by mouth daily the third week and so on until she sees gastroenterology. I have warned the patient about prolonged steroid use and the risk of iatrogenic Cushing's disease, osteoporosis, weight gain, and diabetes. She understands and would like to try to wean off the steroids as her body will tolerate. Ultimately, I hope that GI can help us with a definitive diagnosis through endoscopy and help us find a maintenance medication  that will maintain remission. I recommended that she use MiraLAX to help manage the constipation. Also gave her samples of movantik 25 mg by mouth daily as needed for narcotic induced constipation. I would like to see the patient back in follow-up after she sees the gastroenterologist later this month.  I gave the patient a prescription for 60 mg of prednisone a day but I explained to the patient how I want her to take it. She understands that she is to decrease the dose to 50 mg a day and then by 10 mg each week until she sees gastroenterology.  09/17/14 [facility] last appt.  [facility]  Note is in EPIC.  They are trying to wean her off prednisone and schedule her for MRI enterography.  Patient was doing well on prednisone patient discontinued prednisone last week. Beginning 2 days ago she developed severe persistent watery diarrhea, increasing frequency of sharp crampy upper abdominal pain, and persistent daily nausea. Seems to coincide with weaning off the prednisone. She has the MRI scheduled for next week. She also has +1 pitting edema in both legs that is unresponsive to Lasix. She is not wearing compression stockings.  At that time, my plan was: Discontinue Lasix and start the patient on Bumex 2 mg by mouth daily. Also gave the patient a prescription for compression stockings 15-20 mmHg that are knee-high and instructed her to wear those on a daily basis to help control the swelling. I will give the patient 60 mg of Depo-Medrol can control the diarrhea and try to calm some of the exacerbation basically to try to buy some time until the patient can obtain MRI next week. I would prefer her not to be on prednisone at the time the MRI so that we can get a definitive diagnosis. Patient agrees with this plan. If symptoms worsen she knows to go to the emergency room   12[facility]24/14 [facility] and rescheduled to today.  EGD was normal in August.  I have [facility]arding her follow up.  Was seen in the ER 10/6 with dental caries and was placed on amoxicillin.  Thankfully, she has not been to the ED for abd pain since spring.    I asked the patient about what [facility] network and therefore she [facility]. She never had the MRI enterography.  She only had the EGD.Marland Kitchen. She has been using prednisone intermittently as she deems appropriate. She takes it whenever she has diarrhea. I explained  to the patient that this is not appropriate and she needs to discontinue doing that. I no longer want her on prednisone. To date no abnormality has been found in her GI workup. Apparently [employer] is inside her network. She would like to be referred to [facility] GI.  She continues to use her pain medication 3 times a day as well as her anxiety medication. She is due for urine drug screen. She is not receiving B12 injections. She is not taking vitamin D. Her B12 was found to be low in April. Her vitamin D was found to be very low. She also complains of pain in the posterior aspect of her left hip. The pain is located in the gluteus and in the upper hamstring. She has severe pain with range of motion of the left hip. She denies any falls or injury.  At that time, my plan was: Coordinating care only seems to be a challenge with this patient. Both the pain clinic and the GI referral seem to be lost and there has been no further follow-up.  Therefore I will obtain a urine drug screen today to monitor for compliance with her hydrocodone as well as her Xanax. These two meds seem to be keeping her from going to the emergency room. However I will make sure the patient is not abusing her diverting her medication. I will give the patient vitamin B12 1000 g  1 today.  She can return on a monthly basis for this injection. I recommended she start vitamin D 50,000 units by mouth every week for the next 6 months. Her  vitamin D levels 4 and April. I also recommended she start calcium 1200 mg a day to help prevent osteoporosis.. I will obtain an x-ray of the left hip. At the present time I believe this is more likely a muscular strain. Her exam is not consistent with intra-articular pathology. I will continue to prescribe her pain medication as well as her anxiety medication as long as the urine drug screen shows no evidence of abuse or diversion. I did place a referral to [facility] gastroenterology  01/18/15  since that last visit, the patient has missed 2 appointments with me both [facility].    Her urine drug screen revealed only benzodiazepines and no opiates. Please see the results in the lab section. Therefore I decreased her pain medication from 90 pills a month for 45 pills a month. She is here today complaining of diffuse abdominal pain, diffuse musculoskeletal pain. She complains of pain in both knees and in her left hip. She does have pitting on her fingernails consistent with psoriasis and she raises the possibility of psoriatic arthritis. At her previous specialist she was being treated for psoriatic arthritis. She questions if this can be the reason that both her knees hurt so badly and her hip hurts so badly. X-ray of left hip reveal mild degenerative change but pain is out of proportion to findings on x-ray. X-rays of the knee obtained last year in December revealed no arthritis or skeletal pathology. She is again complaining of pain in both knees and in her left hip. She is pointing to increase the amount of her pain medication. I explained to the patient that her behavior makes me uncomfortable. Her frequent missed appointments, her negative urine drug screen, the fact that she did not follow-up with a specialist for over 6 months because her insurance did not cover it yet she reports daily abdominal pain that is severe and  requires her to take the pain medication for one 5 times a day seems inconsistent. She  does have a peeling scaly rash on the palms of both hands and on the soles of her left foot.  At that time, my plan was:  I will complete the workup for polyarthralgia by obtaining x-rays of the left knee. I will certainly be willing to refer the patient to a rheumatologist for evaluation for possible signs of psoriatic arthritis. However today, her pain is out of proportion to the findings shown on x-ray. I will not increase the amount of her pain medication for the reasons I discussed in history of present illness. Furthermore I told the patient that she missed another appointment [facility] , she will be dismissed from the practice as this prevents me from taking care of her and my other patients appropriately. 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   06/14/15 Since that appointment, the patient was diagnosed with avascular necrosis of the left hip. Orthopedics recommended a total hip replacement. I provided the patient medical clearance for this procedure. However the procedure has still not happen. The patient's surgery is on hold because she has not receive clearance from her dentist yet to proceed with hip replacement. Apparently she has a fracture to the has periodically become infected. Her dentist has recommended removal of the tooth under the care of an oral maxillofacial surgeon. The orthopedist will not operate until that tooth is removed and the Dentist has provided total clearance. The patient is working now with the oral maxillofacial surgeon to schedule a time to have this tooth removed. She is also seen a rheumatologist who diagnosed her with psoriasis. She does have psoriatic changes to the fingernails on both hands. This could potentially be contributing to her polyarthralgia. The rheumatologist is recommended he married injections however they will not perform the injections until after she is healed from her hip replacement due to possibly  complicating the hip replacement. She also reports palpitations. Her heart rate is 100 bpm. She's been taking levothyroxine 137 g daily and not alternating it with 125 as she is supposed to. She also reports diarrhea and persistent abdominal pain that has been well documented. She has a appointment scheduled with [facility] in the next month.  At that time, my plan was: In the past for abdominal pain did improve with prednisone raising the concern for inflammatory bowel disease possibly Crohn's. However she has never received a definitive diagnosis due to failing [facility]. This would likely benefit from the Humira.  Certainly the psoriasis would benefit from humira and this may even help with polyarthralgia if she in fact has psoriatic arthritis. Therefore the patient would benefit from having the tooth removed immediately so that she can have her left hip replacement to develop avascular necrosis in the left hip. Once that is healed, I would recommend immediately following up with rheumatology to begin Humira injections for her psoriasis and also possibly her polyarthralgia. Meanwhile decrease levothyroxine to 125 g by mouth daily and check baseline lab work. Continue her pain medication 3 times a day for chronic abdominal pain as well as her left hip pain.   11/29/15 Since I last saw the patient, she is started to fall frequently. She has several bruises on her right arm over her elbow and over her dorsal forearm. They're very large bruises each the size of a baseball. I bluntly  asked the patient if she was being abused. She is adamant that she is not being abused and that she is falling. I then asked the patient if she could be falling so frequently due to overmedication or intoxication. She denies this. Unfortunately her situation has not changed from her last visit. She has still not proceeded with her hip replacement of her left hip due to avascular necrosis. 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 Orthopedic surgeon supposedly will not operate on her hip until her dental issues have resolved. The patient still does not have the money to have the surgery on her teeth. Therefore nothing has occurred regarding her left hip. Due to the pain she is becoming progressively more deconditioned and her legs. This is what she believes is triggering her falls. She is in severe pain in her left hip. I have only been prescribing 45 hydrocodone per month. However she states that she is in misery the majority of the day. She is not driving. She is having to care for her son with autism. She is unable to flex her hip greater than 60 due to pain. She has severe pain with internal and Rotation.  Past Medical History:  Diagnosis Date  . Acid reflux   . Anemia   . Anxiety   . Arthritis   . AVN of femur (HCC)    left hip  . Clotting disorder (HCC)    undetermined  . Cystitis   . 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 Hypertension   . Neuromuscular disorder (HCC)   . Osteoporosis   . Psoriasis   . Thyroid disease    Past Surgical History:  Procedure Laterality Date  . CHOLECYSTECTOMY    . ESOPHAGOGASTRODUODENOSCOPY  10/2014   at [facility], was normal. Dr. Merri Brunette  . GASTRIC BYPASS  05/2001  . SMALL INTESTINE SURGERY  2004   exploratory   Current Outpatient Prescriptions on File Prior to Visit  Medication Sig Dispense Refill  . ACIPHEX 20 MG tablet TAKE 1 TABLET BY MOUTH TWICE A DAY 60 tablet 11  . ALPRAZolam (XANAX) 0.5 MG tablet TAKE 1 TABLET BY MOUTH TWICE A DAY AS NEEDED FOR ANXIETY 60 tablet 1  . celecoxib (CELEBREX) 200 MG capsule Take 1 capsule (200 mg total) by mouth 2 (two) times daily. 30 capsule 0  . furosemide (LASIX) 40 MG tablet Take 1 tablet (40 mg total) by mouth daily as needed for fluid. 30 tablet 2  . HYDROcodone-acetaminophen (NORCO) 10-325 MG tablet Take 1 tablet by mouth 3 (three) times daily as needed.  Must last 30 days 45 tablet 0  . levonorgestrel-ethinyl estradiol (ENPRESSE-28) tablet Take 1 tablet by mouth daily. 1 Package 11  . levothyroxine (SYNTHROID, LEVOTHROID) 125 MCG tablet TAKE 1 TABLET EVERY DAY 90 tablet 0  . levothyroxine (SYNTHROID, LEVOTHROID) 137 MCG tablet TAKE 1 TABLET (137 MCG TOTAL) BY MOUTH DAILY BEFORE BREAKFAST. 30 tablet 3  . LYRICA 225 MG capsule TAKE ONE CAPSULE BY MOUTH TWICE A DAY 60 capsule 2  . metoprolol succinate (TOPROL-XL) 50 MG 24 hr tablet TAKE 1 TABLET (50 MG TOTAL) BY MOUTH DAILY. TAKE WITH OR IMMEDIATELY FOLLOWING A MEAL. 90 tablet 3  . metroNIDAZOLE (FLAGYL) 500 MG tablet Take 4 tablets (2,000 mg total) by mouth once. 4 tablet 0  . mometasone (ELOCON) 0.1 % cream Apply 1 application topically daily. 45 g 0  .  nortriptyline (PAMELOR) 75 MG capsule TAKE 1 CAPSULE (75 MG TOTAL) BY MOUTH AT BEDTIME. 30 capsule 0  . ondansetron (ZOFRAN) 4 MG tablet TAKE 1 TABLET (4 MG TOTAL) BY MOUTH EVERY 8 (EIGHT) HOURS AS NEEDED FOR NAUSEA OR VOMITING. 20 tablet 0  . potassium chloride SA (K-DUR,KLOR-CON) 20 MEQ tablet Take 1 tablet (20 mEq total) by mouth 2 (two) times daily. 60 tablet 3  . simethicone (MYLICON) 125 MG chewable tablet Chew 125 mg by mouth every 6 (six) hours as needed for flatulence.    . sucralfate (CARAFATE) 1 G tablet Take 1 tablet by mouth 4 (four) times daily.  3  . topiramate (TOPAMAX) 50 MG tablet TAKE 1 TABLET BY MOUTH AT BEDTIME 30 tablet 5  . traMADol (ULTRAM) 50 MG tablet Take 100 mg by mouth every 6 (six) hours as needed. Reported on 07/30/2015  0   No current facility-administered medications on file prior to visit.    Allergies  Allergen Reactions  . Ibuprofen Nausea Only    Bad stomach pains  . Toradol [Ketorolac Tromethamine] Anxiety  . Aspirin Hives  . Cleocin [Clindamycin Hcl] Itching and Rash  . Clindamycin Rash   Social History   Social History  . Marital status: Divorced    Spouse name: N/A  . Number of children: N/A  .  Years of education: N/A   Occupational History  . Not on file.   Social History Main Topics  . Smoking status: Current Every Day Smoker    Packs/day: 0.50    Types: Cigarettes  . Smokeless tobacco: Never Used  . Alcohol use 6.0 oz/week    10 Shots of liquor per week  . Drug use: No  . Sexual activity: Yes    Birth control/ protection: Pill   Other Topics Concern  . Not on file   Social History Narrative  . No narrative on file      Review of Systems  All other systems reviewed and are negative.      Objective:   Physical Exam  Constitutional: She appears well-developed and well-nourished. No distress.  Cardiovascular: Normal rate, regular rhythm and normal heart sounds.   No murmur heard. Pulmonary/Chest: Effort normal and breath sounds normal.  Abdominal: Soft. Bowel sounds are normal. She exhibits no distension. There is no tenderness. There is no rebound and no guarding.  Musculoskeletal: She exhibits no edema.       Left hip: She exhibits decreased range of motion, decreased strength and tenderness. She exhibits no swelling, no crepitus and no deformity.  Skin: She is not diaphoretic. No erythema.          Assessment & Plan:  Left hip pain - Plan: DG HIP UNILAT WITH PELVIS 2-3 VIEWS LEFT  Tachycardia - Plan: CBC with Differential/Platelet, COMPLETE METABOLIC PANEL WITH GFR, TSH  Chronic abdominal pain - Plan: HYDROcodone-acetaminophen (NORCO) 10-325 MG tablet  Given the frequency of the falls and the worsening left hip pain, I will perform an x-ray of the left hip to rule out any trauma to the hip or possible occult fracture. In good faith, I will increase the patient's Norco to 12/14/2023 one pill 3 times a day until she is able to proceed with her hip replacement. Patient must follow-up with the dentist as planned so that she can receive clearance to proceed with her hip surgery. I explained to the patient that I would not continue high-dose narcotics  indefinitely. Given her high resting heart rate and tachycardia, I  have asked the patient to increase Toprol-XL to 50 mg twice daily as she does not believe the medication is lasting a full 24 hours. I believe that her heart rate and blood pressure can tolerate this. I will also monitor her hypothyroidism by checking a TSH. I will also monitor her kidney function, her liver function test, and her CBC

## 2015-11-30 LAB — COMPLETE METABOLIC PANEL WITH GFR
ALT: 14 U/L (ref 6–29)
AST: 20 U/L (ref 10–30)
Albumin: 3.4 g/dL — ABNORMAL LOW (ref 3.6–5.1)
Alkaline Phosphatase: 115 U/L (ref 33–115)
BUN: 17 mg/dL (ref 7–25)
CHLORIDE: 110 mmol/L (ref 98–110)
CO2: 23 mmol/L (ref 20–31)
Calcium: 8.6 mg/dL (ref 8.6–10.2)
Creat: 0.55 mg/dL (ref 0.50–1.10)
GLUCOSE: 88 mg/dL (ref 70–99)
POTASSIUM: 4.1 mmol/L (ref 3.5–5.3)
SODIUM: 142 mmol/L (ref 135–146)
TOTAL PROTEIN: 5.7 g/dL — AB (ref 6.1–8.1)
Total Bilirubin: 0.6 mg/dL (ref 0.2–1.2)

## 2015-12-01 ENCOUNTER — Ambulatory Visit
Admission: RE | Admit: 2015-12-01 | Discharge: 2015-12-01 | Disposition: A | Discharge status: Home / Self Care | Payer: Medicare Other | Source: Ambulatory Visit | Attending: Family Medicine | Admitting: Family Medicine

## 2015-12-01 DIAGNOSIS — M25552 Pain in left hip: Secondary | ICD-10-CM

## 2015-12-16 ENCOUNTER — Other Ambulatory Visit: Payer: Self-pay | Admitting: Family Medicine

## 2015-12-17 NOTE — Telephone Encounter (Signed)
Rx refilled per protocol 

## 2015-12-24 ENCOUNTER — Other Ambulatory Visit: Payer: Self-pay | Admitting: Family Medicine

## 2015-12-24 MED ORDER — METOPROLOL SUCCINATE ER 50 MG PO TB24: 50.0000 mg | ORAL_TABLET | Freq: Two times a day (BID) | ORAL | 3 refills | Status: DC

## 2015-12-27 ENCOUNTER — Other Ambulatory Visit: Payer: Self-pay | Admitting: Family Medicine

## 2015-12-28 MED ORDER — SUCRALFATE 1 G PO TABS: 1.0000 g | ORAL_TABLET | Freq: Four times a day (QID) | ORAL | 0 refills | Status: DC

## 2015-12-28 NOTE — Addendum Note (Signed)
Addended by: Phillips OdorSIX, CHRISTINA H on: 12/28/2015 08:28 AM   Modules accepted: Orders

## 2016-01-04 ENCOUNTER — Ambulatory Visit: Payer: Medicare Other | Admitting: Family Medicine

## 2016-01-04 ENCOUNTER — Other Ambulatory Visit: Payer: Self-pay | Admitting: Family Medicine

## 2016-01-04 NOTE — Telephone Encounter (Signed)
Ok to refill 

## 2016-01-04 NOTE — Telephone Encounter (Signed)
ok 

## 2016-01-06 ENCOUNTER — Other Ambulatory Visit: Payer: Self-pay | Admitting: Family Medicine

## 2016-01-06 DIAGNOSIS — G8929 Other chronic pain: Secondary | ICD-10-CM

## 2016-01-06 DIAGNOSIS — R109 Unspecified abdominal pain: Principal | ICD-10-CM

## 2016-01-06 NOTE — Telephone Encounter (Signed)
Ok to refill 

## 2016-01-06 NOTE — Telephone Encounter (Signed)
PATIENT CALLING TO GET RX FOR HER HYDROCODONE  [phone]

## 2016-01-07 ENCOUNTER — Other Ambulatory Visit: Payer: Self-pay | Admitting: Family Medicine

## 2016-01-07 ENCOUNTER — Telehealth: Payer: Self-pay | Admitting: Family Medicine

## 2016-01-07 MED ORDER — HYDROCODONE-ACETAMINOPHEN 10-325 MG PO TABS: 1.0000 | ORAL_TABLET | Freq: Three times a day (TID) | ORAL | 0 refills | Status: DC | PRN

## 2016-01-07 NOTE — Telephone Encounter (Signed)
ok 

## 2016-01-07 NOTE — Telephone Encounter (Signed)
Patient is calling to say she needs refill on her nortriptyline if possible  [facility]

## 2016-01-07 NOTE — Telephone Encounter (Signed)
rx printed and left pt message ready for pick up today after 2 PM

## 2016-01-10 ENCOUNTER — Ambulatory Visit (INDEPENDENT_AMBULATORY_CARE_PROVIDER_SITE_OTHER): Payer: Medicare Other | Admitting: Family Medicine

## 2016-01-10 ENCOUNTER — Encounter: Payer: Self-pay | Admitting: Family Medicine

## 2016-01-10 VITALS — BP 114/76 | HR 88 | Temp 98.1°F | Resp 18 | Wt 191.0 lb

## 2016-01-10 DIAGNOSIS — R600 Localized edema: Secondary | ICD-10-CM

## 2016-01-10 DIAGNOSIS — D508 Other iron deficiency anemias: Secondary | ICD-10-CM

## 2016-01-10 MED ORDER — FUROSEMIDE 40 MG PO TABS: 40.0000 mg | ORAL_TABLET | Freq: Every day | ORAL | 3 refills | Status: DC | PRN

## 2016-01-10 MED ORDER — NORTRIPTYLINE HCL 75 MG PO CAPS: ORAL_CAPSULE | ORAL | 1 refills | Status: DC

## 2016-01-10 MED ORDER — METOLAZONE 5 MG PO TABS: 5.0000 mg | ORAL_TABLET | Freq: Every day | ORAL | 1 refills | Status: DC

## 2016-01-10 NOTE — Progress Notes (Signed)
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  Musculoskeletal: She exhibits no edema.       Left hip: She exhibits decreased range of motion, decreased strength and tenderness. She exhibits no swelling, no crepitus and no deformity.  Skin: She is not diaphoretic. No erythema.          Assessment & Plan:  Localized edema - Plan: furosemide (LASIX) 40 MG tablet, metolazone (ZAROXOLYN) 5 MG tablet  Other iron deficiency anemia - Plan: Vitamin B12, Anemia panel  Discontinue Bumex. Begin Lasix 40 mg by mouth one or 2 times a week as needed for swelling. She can take Zaroxolyn 5 mg 30 minutes prior to the Lasix to potentiate the effect. I explained that she should only do this 1-2 times a week. Recheck in 1-2 weeks to see if this is helping. I will also check an anemia panel to evaluate for iron deficiency and B12 deficiency as a cause of her anemia.

## 2016-01-10 NOTE — Telephone Encounter (Signed)
Medication called/sent to requested pharmacy  

## 2016-01-11 LAB — ANEMIA PANEL
%SAT: 9 % — ABNORMAL LOW (ref 11–50)
ABS RETIC: 73500 {cells}/uL (ref 20000–80000)
FERRITIN: 40 ng/mL (ref 10–232)
Folate: 10.5 ng/mL (ref >5.4)
IRON: 31 ug/dL — AB (ref 40–190)
RBC.: 3.5 MIL/uL — AB (ref 3.80–5.10)
RETIC CT PCT: 2.1 %
TIBC: 346 ug/dL (ref 250–450)
UIBC: 315 ug/dL (ref 125–400)
Vitamin B-12: 271 pg/mL (ref 200–1100)

## 2016-01-11 NOTE — Addendum Note (Signed)
Addended by: Phineas SemenJOHNSON, Joci Dress A on: 01/11/2016 04:34 PM   Modules accepted: Orders

## 2016-01-12 ENCOUNTER — Telehealth: Payer: Self-pay

## 2016-01-12 MED ORDER — FERROUS SULFATE 325 (65 FE) MG PO TBEC: 325.0000 mg | DELAYED_RELEASE_TABLET | ORAL | 0 refills | Status: DC

## 2016-01-12 NOTE — Telephone Encounter (Signed)
New referral sent to [facility]

## 2016-01-13 ENCOUNTER — Encounter: Payer: Self-pay | Admitting: Physician Assistant

## 2016-01-13 ENCOUNTER — Ambulatory Visit
Admission: RE | Admit: 2016-01-13 | Discharge: 2016-01-13 | Disposition: A | Discharge status: Home / Self Care | Payer: Medicare Other | Source: Ambulatory Visit | Attending: Physician Assistant | Admitting: Physician Assistant

## 2016-01-13 ENCOUNTER — Ambulatory Visit (INDEPENDENT_AMBULATORY_CARE_PROVIDER_SITE_OTHER): Payer: Medicare Other | Admitting: Physician Assistant

## 2016-01-13 VITALS — BP 110/78 | HR 92 | Temp 97.7°F | Resp 16 | Ht 62.0 in | Wt 191.0 lb

## 2016-01-13 DIAGNOSIS — M25562 Pain in left knee: Secondary | ICD-10-CM

## 2016-01-13 DIAGNOSIS — M5432 Sciatica, left side: Secondary | ICD-10-CM

## 2016-01-13 MED ORDER — PREDNISONE 20 MG PO TABS: ORAL_TABLET | ORAL | 0 refills | Status: DC
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
## 2016-01-19 ENCOUNTER — Other Ambulatory Visit: Payer: Self-pay | Admitting: Family Medicine

## 2016-01-20 ENCOUNTER — Ambulatory Visit: Payer: Self-pay | Admitting: Family Medicine

## 2016-01-20 NOTE — Telephone Encounter (Signed)
Iron Rx ordered on 11-1

## 2016-01-24 ENCOUNTER — Other Ambulatory Visit: Payer: Self-pay | Admitting: Family Medicine

## 2016-01-24 NOTE — Telephone Encounter (Signed)
Ok to refill??  Last office visit 01/13/2016.  Last refill 11/18/2015, #1 refill.

## 2016-01-24 NOTE — Telephone Encounter (Signed)
okay

## 2016-01-27 ENCOUNTER — Other Ambulatory Visit: Payer: Self-pay | Admitting: Family Medicine

## 2016-01-27 DIAGNOSIS — L309 Dermatitis, unspecified: Secondary | ICD-10-CM

## 2016-01-31 ENCOUNTER — Other Ambulatory Visit: Payer: Self-pay | Admitting: Family Medicine

## 2016-02-07 ENCOUNTER — Telehealth: Payer: Self-pay | Admitting: Family Medicine

## 2016-02-07 DIAGNOSIS — G8929 Other chronic pain: Secondary | ICD-10-CM

## 2016-02-07 DIAGNOSIS — R109 Unspecified abdominal pain: Principal | ICD-10-CM

## 2016-02-07 MED ORDER — HYDROCODONE-ACETAMINOPHEN 10-325 MG PO TABS: 1.0000 | ORAL_TABLET | Freq: Three times a day (TID) | ORAL | 0 refills | Status: DC | PRN

## 2016-02-07 NOTE — Telephone Encounter (Signed)
Patient requesting a refill on her hydrocodone   CB# [phone]

## 2016-02-07 NOTE — Telephone Encounter (Signed)
RX printed, left up front and patient aware to pick up  

## 2016-02-07 NOTE — Telephone Encounter (Signed)
Ok to refill 

## 2016-02-07 NOTE — Telephone Encounter (Signed)
ok 

## 2016-02-08 ENCOUNTER — Encounter: Payer: Self-pay | Admitting: Family Medicine

## 2016-02-08 ENCOUNTER — Ambulatory Visit (INDEPENDENT_AMBULATORY_CARE_PROVIDER_SITE_OTHER): Payer: Medicare Other | Admitting: Family Medicine

## 2016-02-08 VITALS — BP 116/74 | HR 82 | Temp 98.1°F | Resp 14 | Ht 62.0 in | Wt 174.0 lb

## 2016-02-08 DIAGNOSIS — M87052 Idiopathic aseptic necrosis of left femur: Secondary | ICD-10-CM

## 2016-02-08 DIAGNOSIS — M779 Enthesopathy, unspecified: Secondary | ICD-10-CM

## 2016-02-08 NOTE — Patient Instructions (Signed)
Get the xray of your hip and foot Use the cane Take your pain medicine as prescribed You can try aleve twice a day, between your pain medicine F/U Dr. Tanya NonesPickard 2-3 WEEKS for recheck - Needs 30 minute slot
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
## 2016-02-09 NOTE — Assessment & Plan Note (Signed)
Multiple falls. She is not using the cane which is recommended by her primary care provider since she has not been in to orthopedics to have her surgery. I gave her another prescription for a cane she states that she did not want one that had multiple prongs and this is how the previous prescription was written. I think that she is giving out many excuses for not following through with this chronic issue and continues to fall and have significant pain which makes her. She is drug seeking. I did not prescribe any new medications today. I did order an x-ray on her hip as she is very high-risk for fracture also order on her foot I do think this is likely a callus possible bone spur in the foot.

## 2016-02-10 ENCOUNTER — Encounter: Payer: Self-pay | Admitting: Emergency Medicine

## 2016-02-10 ENCOUNTER — Observation Stay (HOSPITAL_COMMUNITY)
Admission: EM | Admit: 2016-02-10 | Discharge: 2016-02-11 | Disposition: A | Discharge status: Home / Self Care | Payer: Medicare Other | Attending: Internal Medicine | Admitting: Internal Medicine

## 2016-02-10 ENCOUNTER — Emergency Department (HOSPITAL_COMMUNITY): Payer: Medicare Other

## 2016-02-10 DIAGNOSIS — E162 Hypoglycemia, unspecified: Secondary | ICD-10-CM | POA: Diagnosis not present

## 2016-02-10 DIAGNOSIS — R740 Nonspecific elevation of levels of transaminase and lactic acid dehydrogenase [LDH]: Secondary | ICD-10-CM | POA: Diagnosis not present

## 2016-02-10 DIAGNOSIS — E039 Hypothyroidism, unspecified: Secondary | ICD-10-CM | POA: Insufficient documentation

## 2016-02-10 DIAGNOSIS — M810 Age-related osteoporosis without current pathological fracture: Secondary | ICD-10-CM | POA: Insufficient documentation

## 2016-02-10 DIAGNOSIS — M87852 Other osteonecrosis, left femur: Secondary | ICD-10-CM | POA: Diagnosis not present

## 2016-02-10 DIAGNOSIS — R109 Unspecified abdominal pain: Secondary | ICD-10-CM | POA: Insufficient documentation

## 2016-02-10 DIAGNOSIS — D509 Iron deficiency anemia, unspecified: Secondary | ICD-10-CM | POA: Insufficient documentation

## 2016-02-10 DIAGNOSIS — E538 Deficiency of other specified B group vitamins: Secondary | ICD-10-CM | POA: Diagnosis not present

## 2016-02-10 DIAGNOSIS — N179 Acute kidney failure, unspecified: Secondary | ICD-10-CM | POA: Diagnosis not present

## 2016-02-10 DIAGNOSIS — R296 Repeated falls: Secondary | ICD-10-CM | POA: Diagnosis not present

## 2016-02-10 DIAGNOSIS — F17210 Nicotine dependence, cigarettes, uncomplicated: Secondary | ICD-10-CM | POA: Insufficient documentation

## 2016-02-10 DIAGNOSIS — Z833 Family history of diabetes mellitus: Secondary | ICD-10-CM | POA: Insufficient documentation

## 2016-02-10 DIAGNOSIS — Z9181 History of falling: Secondary | ICD-10-CM | POA: Insufficient documentation

## 2016-02-10 DIAGNOSIS — E559 Vitamin D deficiency, unspecified: Secondary | ICD-10-CM | POA: Insufficient documentation

## 2016-02-10 DIAGNOSIS — J323 Chronic sphenoidal sinusitis: Secondary | ICD-10-CM | POA: Insufficient documentation

## 2016-02-10 DIAGNOSIS — E876 Hypokalemia: Secondary | ICD-10-CM | POA: Diagnosis not present

## 2016-02-10 DIAGNOSIS — I10 Essential (primary) hypertension: Secondary | ICD-10-CM | POA: Diagnosis not present

## 2016-02-10 DIAGNOSIS — Z888 Allergy status to other drugs, medicaments and biological substances status: Secondary | ICD-10-CM | POA: Insufficient documentation

## 2016-02-10 DIAGNOSIS — G8929 Other chronic pain: Secondary | ICD-10-CM | POA: Diagnosis not present

## 2016-02-10 DIAGNOSIS — Z9884 Bariatric surgery status: Secondary | ICD-10-CM | POA: Insufficient documentation

## 2016-02-10 DIAGNOSIS — E860 Dehydration: Secondary | ICD-10-CM | POA: Diagnosis not present

## 2016-02-10 DIAGNOSIS — R945 Abnormal results of liver function studies: Secondary | ICD-10-CM | POA: Diagnosis not present

## 2016-02-10 DIAGNOSIS — M1990 Unspecified osteoarthritis, unspecified site: Secondary | ICD-10-CM | POA: Insufficient documentation

## 2016-02-10 DIAGNOSIS — K219 Gastro-esophageal reflux disease without esophagitis: Secondary | ICD-10-CM | POA: Insufficient documentation

## 2016-02-10 DIAGNOSIS — Z825 Family history of asthma and other chronic lower respiratory diseases: Secondary | ICD-10-CM | POA: Insufficient documentation

## 2016-02-10 DIAGNOSIS — L409 Psoriasis, unspecified: Secondary | ICD-10-CM | POA: Insufficient documentation

## 2016-02-10 DIAGNOSIS — I071 Rheumatic tricuspid insufficiency: Secondary | ICD-10-CM | POA: Diagnosis not present

## 2016-02-10 DIAGNOSIS — Z809 Family history of malignant neoplasm, unspecified: Secondary | ICD-10-CM | POA: Insufficient documentation

## 2016-02-10 DIAGNOSIS — Z818 Family history of other mental and behavioral disorders: Secondary | ICD-10-CM | POA: Insufficient documentation

## 2016-02-10 DIAGNOSIS — M797 Fibromyalgia: Secondary | ICD-10-CM | POA: Diagnosis present

## 2016-02-10 DIAGNOSIS — M87059 Idiopathic aseptic necrosis of unspecified femur: Secondary | ICD-10-CM | POA: Diagnosis present

## 2016-02-10 DIAGNOSIS — F1210 Cannabis abuse, uncomplicated: Secondary | ICD-10-CM | POA: Diagnosis not present

## 2016-02-10 DIAGNOSIS — D649 Anemia, unspecified: Secondary | ICD-10-CM | POA: Diagnosis present

## 2016-02-10 DIAGNOSIS — F1190 Opioid use, unspecified, uncomplicated: Secondary | ICD-10-CM | POA: Diagnosis present

## 2016-02-10 DIAGNOSIS — F419 Anxiety disorder, unspecified: Secondary | ICD-10-CM | POA: Diagnosis not present

## 2016-02-10 DIAGNOSIS — Z885 Allergy status to narcotic agent status: Secondary | ICD-10-CM | POA: Insufficient documentation

## 2016-02-10 DIAGNOSIS — Z886 Allergy status to analgesic agent status: Secondary | ICD-10-CM | POA: Insufficient documentation

## 2016-02-10 DIAGNOSIS — Z9049 Acquired absence of other specified parts of digestive tract: Secondary | ICD-10-CM | POA: Insufficient documentation

## 2016-02-10 LAB — COMPREHENSIVE METABOLIC PANEL
ALK PHOS: 228 U/L — AB (ref 38–126)
ALT: 61 U/L — AB (ref 14–54)
ANION GAP: 11 (ref 5–15)
AST: 120 U/L — ABNORMAL HIGH (ref 15–41)
Albumin: 3.3 g/dL — ABNORMAL LOW (ref 3.5–5.0)
BILIRUBIN TOTAL: 1.7 mg/dL — AB (ref 0.3–1.2)
BUN: 52 mg/dL — ABNORMAL HIGH (ref 6–20)
CALCIUM: 8.3 mg/dL — AB (ref 8.9–10.3)
CO2: 21 mmol/L — ABNORMAL LOW (ref 22–32)
CREATININE: 1.67 mg/dL — AB (ref 0.44–1.00)
Chloride: 105 mmol/L (ref 101–111)
GFR, EST AFRICAN AMERICAN: 43 mL/min — AB (ref >60)
GFR, EST NON AFRICAN AMERICAN: 37 mL/min — AB (ref >60)
Glucose, Bld: 84 mg/dL (ref 65–99)
Potassium: 2.7 mmol/L — CL (ref 3.5–5.1)
Sodium: 137 mmol/L (ref 135–145)
TOTAL PROTEIN: 6.3 g/dL — AB (ref 6.5–8.1)

## 2016-02-10 LAB — URINALYSIS, ROUTINE W REFLEX MICROSCOPIC
BILIRUBIN URINE: NEGATIVE
Glucose, UA: NEGATIVE mg/dL
Ketones, ur: 40 mg/dL — AB
Leukocytes, UA: NEGATIVE
NITRITE: POSITIVE — AB
PROTEIN: NEGATIVE mg/dL
SPECIFIC GRAVITY, URINE: 1.019 (ref 1.005–1.030)
pH: 5.5 (ref 5.0–8.0)

## 2016-02-10 LAB — CBC WITH DIFFERENTIAL/PLATELET
BASOS ABS: 0 10*3/uL (ref 0.0–0.1)
BASOS PCT: 1 %
EOS ABS: 0.1 10*3/uL (ref 0.0–0.7)
Eosinophils Relative: 1 %
HEMATOCRIT: 35.2 % — AB (ref 36.0–46.0)
HEMOGLOBIN: 11.6 g/dL — AB (ref 12.0–15.0)
Lymphocytes Relative: 17 %
Lymphs Abs: 0.7 10*3/uL (ref 0.7–4.0)
MCH: 32.9 pg (ref 26.0–34.0)
MCHC: 33 g/dL (ref 30.0–36.0)
MCV: 99.7 fL (ref 78.0–100.0)
MONOS PCT: 9 %
Monocytes Absolute: 0.4 10*3/uL (ref 0.1–1.0)
NEUTROS ABS: 3.1 10*3/uL (ref 1.7–7.7)
NEUTROS PCT: 72 %
Platelets: 135 10*3/uL — ABNORMAL LOW (ref 150–400)
RBC: 3.53 MIL/uL — ABNORMAL LOW (ref 3.87–5.11)
RDW: 14.2 % (ref 11.5–15.5)
WBC: 4.3 10*3/uL (ref 4.0–10.5)

## 2016-02-10 LAB — URINE MICROSCOPIC-ADD ON

## 2016-02-10 LAB — RAPID URINE DRUG SCREEN, HOSP PERFORMED
Amphetamines: NOT DETECTED
Barbiturates: NOT DETECTED
Benzodiazepines: NOT DETECTED
COCAINE: NOT DETECTED
OPIATES: POSITIVE — AB
Tetrahydrocannabinol: POSITIVE — AB

## 2016-02-10 LAB — PREGNANCY, URINE: Preg Test, Ur: NEGATIVE

## 2016-02-10 LAB — ETHANOL

## 2016-02-10 LAB — CBG MONITORING, ED: GLUCOSE-CAPILLARY: 135 mg/dL — AB (ref 65–99)

## 2016-02-10 MED ORDER — HYDROCODONE-ACETAMINOPHEN 10-325 MG PO TABS
1.0000 | ORAL_TABLET | Freq: Four times a day (QID) | ORAL | Status: DC | PRN
  Administered 2016-02-10 – 2016-02-11 (×3): 1 via ORAL
  Filled 2016-02-10 (×3): qty 1

## 2016-02-10 MED ORDER — PREGABALIN 75 MG PO CAPS
225.0000 mg | ORAL_CAPSULE | Freq: Once | ORAL | Status: AC
  Administered 2016-02-11: 225 mg via ORAL
  Filled 2016-02-10: qty 3

## 2016-02-10 MED ORDER — NORTRIPTYLINE HCL 25 MG PO CAPS: 75.0000 mg | ORAL_CAPSULE | Freq: Once | ORAL | Status: DC

## 2016-02-10 MED ORDER — TOPIRAMATE 25 MG PO TABS
50.0000 mg | ORAL_TABLET | Freq: Once | ORAL | Status: AC
  Administered 2016-02-11: 50 mg via ORAL
  Filled 2016-02-10: qty 2

## 2016-02-10 MED ORDER — KCL IN DEXTROSE-NACL 20-5-0.45 MEQ/L-%-% IV SOLN
Freq: Once | INTRAVENOUS | Status: AC
  Administered 2016-02-10: 20:00:00 via INTRAVENOUS
  Filled 2016-02-10: qty 1000

## 2016-02-10 MED ORDER — SODIUM CHLORIDE 0.9 % IV BOLUS (SEPSIS)
500.0000 mL | Freq: Once | INTRAVENOUS | Status: AC
  Administered 2016-02-10: 500 mL via INTRAVENOUS

## 2016-02-10 MED ORDER — ALPRAZOLAM 0.5 MG PO TABS
0.5000 mg | ORAL_TABLET | Freq: Once | ORAL | Status: DC
  Filled 2016-02-10: qty 1

## 2016-02-10 MED ORDER — POTASSIUM CHLORIDE CRYS ER 20 MEQ PO TBCR
40.0000 meq | EXTENDED_RELEASE_TABLET | Freq: Once | ORAL | Status: AC
  Administered 2016-02-10: 40 meq via ORAL
  Filled 2016-02-10: qty 2

## 2016-02-10 NOTE — ED Triage Notes (Addendum)
Pt BIB [facility] after being found passed out on the bed, found by a worker who comes to help out with her autistic son. Pt was arousable to touch upon EMS arrival but was hypoglycemic at 55 CBG. Pt was given cranberry juice on scene and it came up to 75. Pt also reports that she has been falling and off balance today. Hx of avascular necrosis on the L side.  Alert and oriented at this time.

## 2016-02-10 NOTE — ED Notes (Signed)
Bed: [id number] Expected date:  Expected time:  Means of arrival:  Comments: EMS- 40yo F,  AMS/hypoglycemia

## 2016-02-10 NOTE — ED Notes (Signed)
Pt stated that she did not want to give urine sample right at the moment. Pt stated that she still feels weak and would like to wait till she feels more stable to walk to restroom. (bedpan was given as an option)

## 2016-02-10 NOTE — ED Provider Notes (Signed)
Emergency Department Provider Note   I have reviewed the triage vital signs and the nursing notes.   HISTORY  Chief Complaint Hypoglycemia and Fall   HPI Dorothy Dyer is a 40 y.o. female with PMH of anemia, acid reflux, avascular necrosis of the left hip, HTN, and undetermined clotting disorder presents to the emergency department for evaluation of frequent falls in last 24 hours and hypoglycemia. The patient was found on the head of her house today, minimally responsive. EMS was called and she was found to be hypoglycemic on the scene. They were able to wake her and she drank some juice and became more alert and felt better.   The patient states that since yesterday evening she's felt more off balance and frequent falls yesterday. She states she did not sleep well because of playing games on her phone. She is also not been eating or drinking well but states this is not unusual for her. She reports multiple falls and the feeling like she is falling to the left with ambulation. She does have known avascular necrosis of the left hip but states that she's had this problem for over a year never had the significant gait instability. She denies any alcohol or illicit drug use. She reports using her prescription drugs as she is supposed to. No fevers or chills. No grossly bloody or melanotic stools. She cannot recall the exact details surrounding how she became unresponsive on the bed. Does not take insulin or other hypoglycemic agents.    Past Medical History:  Diagnosis Date  . Acid reflux   . Anemia   . Anxiety   . Arthritis   . AVN of femur (HCC)    left hip  . Clotting disorder (HCC)    undetermined  . Cystitis   . 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 Hypertension   . Neuromuscular disorder (HCC)   . Osteoporosis   . Psoriasis   . Thyroid disease     Patient Active Problem List   Diagnosis Date Noted  . Hypokalemia 02/11/2016  . Hypoglycemia 02/11/2016  . AKI  (acute kidney injury) (HCC) 02/10/2016  . AVN of femur (HCC)   . 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 Hoarseness 06/17/2014  . 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 Fibromyalgia 06/17/2014  . Chronic abdominal pain 06/17/2014  . Knee pain, bilateral 06/17/2014  . Vitamin D deficiency 06/17/2014  . Vitamin B12 deficiency 06/17/2014  . Iron deficiency anemia 06/17/2014  . Thyroid activity decreased 06/17/2014  . Abnormal Pap smear of cervix 06/17/2014  . Anemia   . Anxiety   . Arthritis   . Acid reflux   . Neuromuscular disorder (HCC)   . Hypertension   . Osteoporosis   . HPV (human papilloma virus) infection     Past Surgical History:  Procedure Laterality Date  . CHOLECYSTECTOMY    . ESOPHAGOGASTRODUOD[facility]OSCOPY  [facility]2014   [facility] Dr. Merri BrunetteFina  . GASTRIC BYPASS  05/2001  . SMALL INTESTINE SURGERY  2004   exploratory      Allergies Ibuprofen; Toradol [ketorolac tromethamine]; Aspirin; and Cleocin [clindamycin hcl]  Family History  Problem Relation Age of Onset  . Depression Mother   . Diabetes Mother   . Hyperlipidemia Mother   . Depression Father   . Hearing loss Father   . Hyperlipidemia Father   . Hypertension Father   . Learning disabilities Father   . Mental illness Father   . Asthma Brother   .  Alcohol abuse Maternal Grandmother   . Arthritis Maternal Grandmother   . Cancer Maternal Grandmother   . Cancer Maternal Grandfather   . Mental illness Paternal Grandmother   . Heart disease Paternal Grandfather   . Stroke Paternal Grandfather     Social History Social History  Substance Use Topics  . Smoking status: Current Every Day Smoker    Packs/day: 0.50    Types: Cigarettes  . Smokeless tobacco: Never Used  . Alcohol use 6.0 oz/week    10 Shots of liquor per week    Review of Systems  Constitutional: No fever/chills Eyes: No visual changes. ENT: No sore throat. Cardiovascular: Denies chest pain. Respiratory: Denies shortness of  breath. Gastrointestinal: No abdominal pain. No nausea, no vomiting. No diarrhea. No constipation. Genitourinary: Negative for dysuria. Musculoskeletal: Negative for back pain. Skin: Negative for rash. Neurological: Negative for headaches, focal weakness or numbness. Positive gait instability and frequent falls.   10-point ROS otherwise negative.  ____________________________________________   PHYSICAL EXAM:  VITAL SIGNS: ED Triage Vitals [02/10/16 1728]  Enc Vitals Group     BP 108/73     Pulse Rate 87     Resp 10     Temp 97.8 F (36.6 C)     Temp Source Oral     SpO2 98 %    Constitutional: Alert and oriented. Appears drowsy at times.  Eyes: Conjunctivae are normal. PERRL. EOMI. Head: Atraumatic. Nose: No congestion/rhinnorhea. Mouth/Throat: Mucous membranes are moist.  Oropharynx non-erythematous. Neck: No stridor. No cervical spine tenderness to palpation. Cardiovascular: Normal rate, regular rhythm. Good peripheral circulation. Grossly normal heart sounds.   Respiratory: Normal respiratory effort.  No retractions. Lungs CTAB. Gastrointestinal: Soft and nontender. No distention.  Musculoskeletal: Pain with movement of the left hip (reported as baseline).  Neurologic:  Normal speech and language. No gross focal neurologic deficits are appreciated. Normal finger-to-nose and no pronator drift. Gait with limp but normal trendelenburg.  Skin:  Skin is warm, dry and intact. No rash noted. Multiple bruises over arms and legs.    ____________________________________________   LABS (all labs ordered are listed, but only abnormal results are displayed)  Labs Reviewed  COMPREHENSIVE METABOLIC PANEL - Abnormal; Notable for the following:       Result Value   Potassium 2.7 (*)    CO2 21 (*)    BUN 52 (*)    Creatinine, Ser 1.67 (*)    Calcium 8.3 (*)    Total Protein 6.3 (*)    Albumin 3.3 (*)    AST 120 (*)    ALT 61 (*)    Alkaline Phosphatase 228 (*)    Total  Bilirubin 1.7 (*)    GFR calc non Af Amer 37 (*)    GFR calc Af Amer 43 (*)    All other components within normal limits  CBC WITH DIFFERENTIAL/PLATELET - Abnormal; Notable for the following:    RBC 3.53 (*)    Hemoglobin 11.6 (*)    HCT 35.2 (*)    Platelets 135 (*)    All other components within normal limits  URINALYSIS, ROUTINE W REFLEX MICROSCOPIC (NOT AT [facility]) - Abnormal; Notable for the following:    APPearance CLOUDY (*)    Hgb urine dipstick LARGE (*)    Ketones, ur 40 (*)    Nitrite POSITIVE (*)    All other components within normal limits  RAPID URINE DRUG SCREEN, HOSP PERFORMED - Abnormal; Notable for the following:    Opiates POSITIVE (*)  Tetrahydrocannabinol POSITIVE (*)    All other components within normal limits  URINE MICROSCOPIC-ADD ON - Abnormal; Notable for the following:    Squamous Epithelial / LPF 0-5 (*)    Bacteria, UA MANY (*)    Casts GRANULAR CAST (*)    All other components within normal limits  BASIC METABOLIC PANEL - Abnormal; Notable for the following:    Potassium 3.2 (*)    CO2 21 (*)    BUN 32 (*)    Calcium 8.1 (*)    All other components within normal limits  CBC - Abnormal; Notable for the following:    WBC 3.2 (*)    RBC 3.15 (*)    Hemoglobin 10.3 (*)    HCT 30.8 (*)    Platelets 135 (*)    All other components within normal limits  HEPATIC FUNCTION PANEL - Abnormal; Notable for the following:    Total Protein 5.3 (*)    Albumin 2.7 (*)    AST 94 (*)    Alkaline Phosphatase 175 (*)    Total Bilirubin 1.3 (*)    Indirect Bilirubin 1.0 (*)    All other components within normal limits  GLUCOSE, CAPILLARY - Abnormal; Notable for the following:    Glucose-Capillary 102 (*)    All other components within normal limits  CBG MONITORING, ED - Abnormal; Notable for the following:    Glucose-Capillary 135 (*)    All other components within normal limits  ETHANOL  PREGNANCY, URINE  MAGNESIUM  GLUCOSE, CAPILLARY    ____________________________________________  EKG   EKG Interpretation  Date/Time:  Thursday February 10 2016 17:26:34 EST Ventricular Rate:  90 PR Interval:    QRS Duration: 97 QT Interval:  383 QTC Calculation: 469 R Axis:   27 Text Interpretation:  Sinus rhythm Abnormal R-wave progression, early transition Borderline repolarization abnormality No STEMI.  Similar to prior.  Confirmed by LONG MD, JOSHUA [phone]) on 02/10/2016 6:42:42 PM       ____________________________________________  RADIOLOGY  Ct Head Wo Contrast  Result Date: 02/10/2016 CLINICAL DATA:  Falling and off balance today. Found down on bed. Hypoglycemic. EXAM: CT HEAD WITHOUT CONTRAST TECHNIQUE: Contiguous axial images were obtained from the base of the skull through the vertex without intravenous contrast. COMPARISON:  None. FINDINGS: BRAIN: The ventricles and sulci are normal. No intraparenchymal hemorrhage, mass effect nor midline shift. No acute large vascular territory infarcts. No abnormal extra-axial fluid collections. Basal cisterns are patent. VASCULAR: Unremarkable. SKULL/SOFT TISSUES: No skull fracture. No significant soft tissue swelling. ORBITS/SINUSES: The included ocular globes and orbital contents are normal.Mild LEFT sphenoid sinusitis. Mastoid air cells are well aerated. OTHER: None. IMPRESSION: Normal CT HEAD. Electronically Signed   By: Awilda Metro M.D.   On: 02/10/2016 18:37    ____________________________________________   PROCEDURES  Procedure(s) performed:   Procedures  None ____________________________________________   INITIAL IMPRESSION / ASSESSMENT AND PLAN / ED COURSE  Pertinent labs & imaging results that were available during my care of the patient were reviewed by me and considered in my medical decision making (see chart for details).  Patient presents to the ED after being found unresponsive with hypoglycemia. She has had multiple falls in the last 24 hours and  feels like she is falling to the left. Patient has an intact neurological exam. Does have a limping gait thought to be 2/2 AVN of the left hip. No fever or chills. Labs show AKI and hypokalemia. I am replacing K and giving IVF. No  MRI at this time but would consider if concern for acute CVA remains after IVF and electrolyte replacement. Patient appears somewhat intoxicated on arrival. EtOH negative but UDS is pending.   Discussed patient's case with hospitalist, Dr. Toniann Fail.  Recommend admission to tele, obs bed.  I will place holding orders per their request. Patient and family (if present) updated with plan. Care transferred to hospitlaist service.  I reviewed all nursing notes, vitals, pertinent old records, EKGs, labs, imaging (as available).  ____________________________________________  FINAL CLINICAL IMPRESSION(S) / ED DIAGNOSES  Final diagnoses:  AKI (acute kidney injury) (HCC)     MEDICATIONS GIVEN DURING THIS VISIT:  Medications  HYDROcodone-acetaminophen (NORCO) 10-325 MG per tablet 1 tablet (1 tablet Oral Given 02/11/16 0758)  ALPRAZolam Prudy Feeler) tablet 0.5 mg (0.5 mg Oral Not Given 02/11/16 0017)  topiramate (TOPAMAX) tablet 50 mg (not administered)  ALPRAZolam (XANAX) tablet 0.5 mg (not administered)  pantoprazole (PROTONIX) EC tablet 40 mg (40 mg Oral Not Given 02/11/16 1000)  nortriptyline (PAMELOR) capsule 75 mg (not administered)  pregabalin (LYRICA) capsule 225 mg (225 mg Oral Given 02/11/16 0910)  sucralfate (CARAFATE) tablet 1 g (not administered)  metoprolol succinate (TOPROL-XL) 24 hr tablet 50 mg (50 mg Oral Given 02/11/16 0910)  acetaminophen (TYLENOL) tablet 650 mg (not administered)    Or  acetaminophen (TYLENOL) suppository 650 mg (not administered)  ondansetron (ZOFRAN) tablet 4 mg (not administered)    Or  ondansetron (ZOFRAN) injection 4 mg (not administered)  enoxaparin (LOVENOX) injection 40 mg (40 mg Subcutaneous Given 02/11/16 0911)  levothyroxine  (SYNTHROID, LEVOTHROID) tablet 125 mcg (125 mcg Oral Given 02/11/16 0758)  levothyroxine (SYNTHROID, LEVOTHROID) tablet 137 mcg (not administered)  sodium chloride 0.9 % bolus 500 mL (0 mLs Intravenous Stopped 02/10/16 2018)  dextrose 5 % and 0.45 % NaCl with KCl 20 mEq/L infusion ( Intravenous New Bag/Given 02/10/16 2018)  potassium chloride SA (K-DUR,KLOR-CON) CR tablet 40 mEq (40 mEq Oral Given 02/10/16 1938)  topiramate (TOPAMAX) tablet 50 mg (50 mg Oral Given 02/11/16 0016)  pregabalin (LYRICA) capsule 225 mg (225 mg Oral Given 02/11/16 0017)  nortriptyline (PAMELOR) capsule 75 mg (75 mg Oral Given 02/11/16 0034)  potassium chloride SA (K-DUR,KLOR-CON) CR tablet 40 mEq (40 mEq Oral Given 02/11/16 0305)  potassium chloride SA (K-DUR,KLOR-CON) CR tablet 40 mEq (40 mEq Oral Given 02/11/16 0909)     NEW OUTPATIENT MEDICATIONS STARTED DURING THIS VISIT:  None   Note:  This document was prepared using Dragon voice recognition software and may include unintentional dictation errors.  Alona Bene, MD Emergency Medicine  Maia Plan, MD 02/11/16 [phone]

## 2016-02-10 NOTE — ED Notes (Signed)
Patient denies pain and is resting comfortably.  

## 2016-02-10 NOTE — ED Notes (Signed)
Patient transported to CT 

## 2016-02-11 ENCOUNTER — Observation Stay (HOSPITAL_COMMUNITY)
Admission: EM | Admit: 2016-02-11 | Discharge: 2016-02-11 | Disposition: A | Discharge status: Home / Self Care | Payer: Medicare Other | Source: Home / Self Care | Attending: Internal Medicine | Admitting: Internal Medicine

## 2016-02-11 ENCOUNTER — Observation Stay (HOSPITAL_BASED_OUTPATIENT_CLINIC_OR_DEPARTMENT_OTHER): Payer: Medicare Other

## 2016-02-11 ENCOUNTER — Encounter (HOSPITAL_COMMUNITY): Payer: Self-pay | Admitting: Internal Medicine

## 2016-02-11 DIAGNOSIS — N179 Acute kidney failure, unspecified: Secondary | ICD-10-CM | POA: Diagnosis not present

## 2016-02-11 DIAGNOSIS — E876 Hypokalemia: Secondary | ICD-10-CM | POA: Diagnosis not present

## 2016-02-11 DIAGNOSIS — F1210 Cannabis abuse, uncomplicated: Secondary | ICD-10-CM | POA: Diagnosis present

## 2016-02-11 DIAGNOSIS — M87052 Idiopathic aseptic necrosis of left femur: Secondary | ICD-10-CM

## 2016-02-11 DIAGNOSIS — E162 Hypoglycemia, unspecified: Secondary | ICD-10-CM | POA: Diagnosis present

## 2016-02-11 DIAGNOSIS — R55 Syncope and collapse: Secondary | ICD-10-CM

## 2016-02-11 DIAGNOSIS — R740 Nonspecific elevation of levels of transaminase and lactic acid dehydrogenase [LDH]: Secondary | ICD-10-CM

## 2016-02-11 DIAGNOSIS — G8929 Other chronic pain: Secondary | ICD-10-CM

## 2016-02-11 DIAGNOSIS — R109 Unspecified abdominal pain: Secondary | ICD-10-CM | POA: Diagnosis not present

## 2016-02-11 DIAGNOSIS — F1190 Opioid use, unspecified, uncomplicated: Secondary | ICD-10-CM | POA: Diagnosis not present

## 2016-02-11 DIAGNOSIS — R4189 Other symptoms and signs involving cognitive functions and awareness: Secondary | ICD-10-CM | POA: Diagnosis not present

## 2016-02-11 LAB — CBC
HEMATOCRIT: 30.8 % — AB (ref 36.0–46.0)
Hemoglobin: 10.3 g/dL — ABNORMAL LOW (ref 12.0–15.0)
MCH: 32.7 pg (ref 26.0–34.0)
MCHC: 33.4 g/dL (ref 30.0–36.0)
MCV: 97.8 fL (ref 78.0–100.0)
Platelets: 135 10*3/uL — ABNORMAL LOW (ref 150–400)
RBC: 3.15 MIL/uL — ABNORMAL LOW (ref 3.87–5.11)
RDW: 14.1 % (ref 11.5–15.5)
WBC: 3.2 10*3/uL — AB (ref 4.0–10.5)

## 2016-02-11 LAB — GLUCOSE, CAPILLARY
GLUCOSE-CAPILLARY: 102 mg/dL — AB (ref 65–99)
GLUCOSE-CAPILLARY: 82 mg/dL (ref 65–99)
Glucose-Capillary: 73 mg/dL (ref 65–99)

## 2016-02-11 LAB — MAGNESIUM: Magnesium: 1.8 mg/dL (ref 1.7–2.4)

## 2016-02-11 LAB — ECHOCARDIOGRAM COMPLETE
Height: 61.5 in
WEIGHTICAEL: 2779.56 [oz_av]

## 2016-02-11 LAB — BASIC METABOLIC PANEL
Anion gap: 9 (ref 5–15)
BUN: 32 mg/dL — AB (ref 6–20)
CHLORIDE: 109 mmol/L (ref 101–111)
CO2: 21 mmol/L — ABNORMAL LOW (ref 22–32)
Calcium: 8.1 mg/dL — ABNORMAL LOW (ref 8.9–10.3)
Creatinine, Ser: 0.89 mg/dL (ref 0.44–1.00)
GFR calc Af Amer: >60 mL/min (ref >60)
GFR calc non Af Amer: >60 mL/min (ref >60)
GLUCOSE: 86 mg/dL (ref 65–99)
POTASSIUM: 3.2 mmol/L — AB (ref 3.5–5.1)
Sodium: 139 mmol/L (ref 135–145)

## 2016-02-11 LAB — HEPATIC FUNCTION PANEL
ALT: 51 U/L (ref 14–54)
AST: 94 U/L — ABNORMAL HIGH (ref 15–41)
Albumin: 2.7 g/dL — ABNORMAL LOW (ref 3.5–5.0)
Alkaline Phosphatase: 175 U/L — ABNORMAL HIGH (ref 38–126)
BILIRUBIN DIRECT: 0.3 mg/dL (ref 0.1–0.5)
BILIRUBIN TOTAL: 1.3 mg/dL — AB (ref 0.3–1.2)
Indirect Bilirubin: 1 mg/dL — ABNORMAL HIGH (ref 0.3–0.9)
Total Protein: 5.3 g/dL — ABNORMAL LOW (ref 6.5–8.1)

## 2016-02-11 LAB — CORTISOL: Cortisol, Plasma: 10.5 ug/dL

## 2016-02-11 MED ORDER — LEVOTHYROXINE SODIUM 25 MCG PO TABS
125.0000 ug | ORAL_TABLET | ORAL | Status: DC
  Administered 2016-02-11: 125 ug via ORAL
  Filled 2016-02-11: qty 1

## 2016-02-11 MED ORDER — LEVOTHYROXINE SODIUM 25 MCG PO TABS: 125.0000 ug | ORAL_TABLET | Freq: Every day | ORAL | Status: DC

## 2016-02-11 MED ORDER — NORTRIPTYLINE HCL 25 MG PO CAPS
75.0000 mg | ORAL_CAPSULE | Freq: Once | ORAL | Status: AC
  Administered 2016-02-11: 75 mg via ORAL
  Filled 2016-02-11 (×2): qty 3

## 2016-02-11 MED ORDER — ONDANSETRON HCL 4 MG PO TABS
4.0000 mg | ORAL_TABLET | Freq: Four times a day (QID) | ORAL | Status: DC | PRN
Start: 2016-02-11 — End: 2016-02-11

## 2016-02-11 MED ORDER — ADULT MULTIVITAMIN W/MINERALS CH
1.0000 | ORAL_TABLET | Freq: Every day | ORAL | Status: DC
  Administered 2016-02-11: 1 via ORAL
  Filled 2016-02-11: qty 1

## 2016-02-11 MED ORDER — SUCRALFATE 1 G PO TABS: 1.0000 g | ORAL_TABLET | Freq: Four times a day (QID) | ORAL | Status: DC | PRN

## 2016-02-11 MED ORDER — ENSURE ENLIVE PO LIQD: 237.0000 mL | Freq: Two times a day (BID) | ORAL | 12 refills | Status: DC

## 2016-02-11 MED ORDER — POTASSIUM CHLORIDE CRYS ER 20 MEQ PO TBCR
40.0000 meq | EXTENDED_RELEASE_TABLET | Freq: Once | ORAL | Status: AC
  Administered 2016-02-11: 40 meq via ORAL
  Filled 2016-02-11: qty 2

## 2016-02-11 MED ORDER — NORTRIPTYLINE HCL 25 MG PO CAPS
75.0000 mg | ORAL_CAPSULE | Freq: Every day | ORAL | Status: DC
  Filled 2016-02-11: qty 3

## 2016-02-11 MED ORDER — PREGABALIN 75 MG PO CAPS
225.0000 mg | ORAL_CAPSULE | Freq: Two times a day (BID) | ORAL | Status: DC
  Administered 2016-02-11: 225 mg via ORAL
  Filled 2016-02-11: qty 3

## 2016-02-11 MED ORDER — LEVOTHYROXINE SODIUM 25 MCG PO TABS: 137.0000 ug | ORAL_TABLET | ORAL | Status: DC

## 2016-02-11 MED ORDER — ENOXAPARIN SODIUM 40 MG/0.4ML ~~LOC~~ SOLN
40.0000 mg | SUBCUTANEOUS | Status: DC
  Administered 2016-02-11: 40 mg via SUBCUTANEOUS
  Filled 2016-02-11: qty 0.4

## 2016-02-11 MED ORDER — TOPIRAMATE 25 MG PO TABS: 50.0000 mg | ORAL_TABLET | Freq: Every day | ORAL | Status: DC

## 2016-02-11 MED ORDER — ACETAMINOPHEN 650 MG RE SUPP: 650.0000 mg | Freq: Four times a day (QID) | RECTAL | Status: DC | PRN

## 2016-02-11 MED ORDER — METOPROLOL SUCCINATE ER 50 MG PO TB24
50.0000 mg | ORAL_TABLET | Freq: Two times a day (BID) | ORAL | Status: DC
  Administered 2016-02-11: 50 mg via ORAL
  Filled 2016-02-11: qty 1

## 2016-02-11 MED ORDER — ALPRAZOLAM 0.5 MG PO TABS: 0.5000 mg | ORAL_TABLET | Freq: Two times a day (BID) | ORAL | Status: DC | PRN

## 2016-02-11 MED ORDER — ONDANSETRON HCL 4 MG/2ML IJ SOLN: 4.0000 mg | Freq: Four times a day (QID) | INTRAMUSCULAR | Status: DC | PRN

## 2016-02-11 MED ORDER — PANTOPRAZOLE SODIUM 40 MG PO TBEC
40.0000 mg | DELAYED_RELEASE_TABLET | Freq: Every day | ORAL | Status: DC
  Filled 2016-02-11: qty 1

## 2016-02-11 MED ORDER — RABEPRAZOLE SODIUM 20 MG PO TBEC
20.0000 mg | DELAYED_RELEASE_TABLET | Freq: Two times a day (BID) | ORAL | Status: DC
  Administered 2016-02-11 (×2): 20 mg via ORAL
  Filled 2016-02-11: qty 1

## 2016-02-11 MED ORDER — ENSURE ENLIVE PO LIQD
237.0000 mL | Freq: Two times a day (BID) | ORAL | Status: DC
  Administered 2016-02-11 (×2): 237 mL via ORAL

## 2016-02-11 MED ORDER — ACETAMINOPHEN 325 MG PO TABS: 650.0000 mg | ORAL_TABLET | Freq: Four times a day (QID) | ORAL | Status: DC | PRN

## 2016-02-11 NOTE — H&P (Signed)
History and Physical    Dorothy Dyer WUJ:[id number] DOB: 1976-02-15 DOA: 02/10/2016  PCP: Leo Grosser, MD  Patient coming from: Home.  Chief Complaint: Unresponsiveness.  HPI: Dorothy Dyer is a 40 y.o. female with history of chronic abdominal pain status post multiple procedures including gastric bypass cholecystectomy, hypertension, hypothyroidism was found to be unresponsive by patient's childcare aide. As per the patient, patient's childcare aide was unable to open the door and had to bring in help to open the door of the patient's house and patient was found lying on the bed unresponsive. EMS was called and blood sugars were recorded to be 55. Patient was given juice and was brought to the ER. Labs in the ER show acute renal failure with hypokalemia. Patient denies any vomiting or diarrhea. But states that she has been having poor appetite. Labs also reveal elevated LFTs.   ED Course: Patient was started on D5 normal saline with potassium replacement. EKG was showing normal sinus rhythm with normal QTc interval. Patient is on pain medications for avascular necrosis of the left leg and has gait problems from that. CT head was unremarkable and patient appears nonfocal.  Review of Systems: As per HPI, rest all negative.   Past Medical History:  Diagnosis Date  . Acid reflux   . Anemia   . Anxiety   . Arthritis   . AVN of femur (HCC)    left hip  . Clotting disorder (HCC)    undetermined  . Cystitis   . 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 Hypertension   . Neuromuscular disorder (HCC)   . Osteoporosis   . Psoriasis   . Thyroid disease     Past Surgical History:  Procedure Laterality Date  . CHOLECYSTECTOMY    . ESOPHAGOGASTRODUODENOSCOPY  10/2014   at [facility], was normal. Dr. Merri Brunette  . GASTRIC BYPASS  05/2001  . SMALL INTESTINE SURGERY  2004   exploratory     reports that she has been smoking Cigarettes.  She has been smoking about 0.50 packs per  day. She has never used smokeless tobacco. She reports that she drinks about 6.0 oz of alcohol per week . She reports that she does not use drugs.  Allergies  Allergen Reactions  . Ibuprofen Nausea Only    Bad stomach pains  . Toradol [Ketorolac Tromethamine] Anxiety  . Aspirin Hives  . Cleocin [Clindamycin Hcl] Itching and Rash    Family History  Problem Relation Age of Onset  . Depression Mother   . Diabetes Mother   . Hyperlipidemia Mother   . Depression Father   . Hearing loss Father   . Hyperlipidemia Father   . Hypertension Father   . Learning disabilities Father   . Mental illness Father   . Asthma Brother   . Alcohol abuse Maternal Grandmother   . Arthritis Maternal Grandmother   . Cancer Maternal Grandmother   . Cancer Maternal Grandfather   . Mental illness Paternal Grandmother   . Heart disease Paternal Grandfather   . Stroke Paternal Grandfather     Prior to Admission medications   Medication Sig Start Date End Date Taking? Authorizing Provider  ACIPHEX 20 MG tablet TAKE 1 TABLET BY MOUTH TWICE A DAY Patient taking differently: TAKE 20mg  TABLET BY MOUTH TWICE A DAY 01/19/16  Yes Donita Brooks, MD  ALPRAZolam (XANAX) 0.5 MG tablet TAKE 1 TABLET BY MOUTH TWICE A DAY AS NEEDED Patient taking differently: TAKE 0.5 TABLET BY MOUTH TWICE A  DAY AS NEEDED for anxiety 01/25/16  Yes Donita BrooksWarren T Pickard, MD  furosemide (LASIX) 40 MG tablet Take 1 tablet (40 mg total) by mouth daily as needed. Patient taking differently: Take 40 mg by mouth daily as needed for fluid.  01/10/16  Yes Donita BrooksWarren T Pickard, MD  HYDROcodone-acetaminophen (NORCO) 10-325 MG tablet Take 1 tablet by mouth 3 (three) times daily as needed. Must last 30 days Patient taking differently: Take 1 tablet by mouth 3 (three) times daily as needed for moderate pain. Must last 30 days 02/07/16  Yes Donita BrooksWarren T Pickard, MD  levothyroxine (SYNTHROID, LEVOTHROID) 125 MCG tablet TAKE 1 TABLET EVERY DAY Patient taking  differently: TAKE 125mg  TABLET by mouth  EVERY other DAY 11/25/15  Yes Donita BrooksWarren T Pickard, MD  levothyroxine (SYNTHROID, LEVOTHROID) 137 MCG tablet TAKE 1 TABLET (137 MCG TOTAL) BY MOUTH DAILY BEFORE BREAKFAST. Patient taking differently: TAKE 1 TABLET (137 MCG TOTAL) BY MOUTH every other day BEFORE BREAKFAST. 10/14/15  Yes Donita BrooksWarren T Pickard, MD  LYRICA 225 MG capsule TAKE ONE CAPSULE BY MOUTH TWICE A DAY Patient taking differently: TAKE 225mg  CAPSULE BY MOUTH TWICE A DAY 01/05/16  Yes Donita BrooksWarren T Pickard, MD  metolazone (ZAROXOLYN) 5 MG tablet Take 1 tablet (5 mg total) by mouth daily. Take zaroxylyn 5 mg 30 minutes before lasix 1-2 times a week. 01/10/16  Yes Donita BrooksWarren T Pickard, MD  metoprolol succinate (TOPROL-XL) 50 MG 24 hr tablet Take 1 tablet (50 mg total) by mouth 2 (two) times daily. 12/24/15  Yes Donita BrooksWarren T Pickard, MD  mometasone (ELOCON) 0.1 % cream APPLY TO AFFECTED AREA EVERY DAY Patient taking differently: APPLY TO AFFECTED AREA daily prn for scorisis 01/27/16  Yes Donita BrooksWarren T Pickard, MD  nortriptyline (PAMELOR) 75 MG capsule TAKE 1 CAPSULE (75 MG TOTAL) BY MOUTH AT BEDTIME. 01/10/16  Yes Donita BrooksWarren T Pickard, MD  ondansetron (ZOFRAN) 4 MG tablet TAKE 1 TABLET (4 MG TOTAL) BY MOUTH EVERY 8 (EIGHT) HOURS AS NEEDED FOR NAUSEA OR VOMITING. 12/28/14  Yes Donita BrooksWarren T Pickard, MD  potassium chloride SA (K-DUR,KLOR-CON) 20 MEQ tablet Take 1 tablet (20 mEq total) by mouth 2 (two) times daily. Patient taking differently: Take 20 mEq by mouth 2 (two) times daily as needed (when taking lasix).  03/25/15  Yes Donita BrooksWarren T Pickard, MD  simethicone (MYLICON) 80 MG chewable tablet Chew 80 mg by mouth every 6 (six) hours as needed for flatulence.   Yes Historical Provider, MD  sucralfate (CARAFATE) 1 g tablet Take 1 tablet (1 g total) by mouth 4 (four) times daily. Patient taking differently: Take 1 g by mouth 4 (four) times daily as needed (stomach coating).  12/28/15  Yes Donita BrooksWarren T Pickard, MD  topiramate (TOPAMAX) 50 MG  tablet TAKE 1 TABLET BY MOUTH AT BEDTIME Patient taking differently: TAKE 50mg  TABLET BY MOUTH AT BEDTIME 01/31/16  Yes Donita BrooksWarren T Pickard, MD    Physical Exam: Vitals:   02/10/16 1808 02/10/16 1959 02/10/16 2139 02/10/16 2147  BP: 112/78 104/74  104/66  Pulse: 84 82  77  Resp: 10 13  16   Temp:    97.8 F (36.6 C)  TempSrc:    Oral  SpO2: 99% 100%  100%  Weight:   78.8 kg (173 lb 11.6 oz)   Height:   5' 1.5" (1.562 m)       Constitutional: Moderately built and nourished. Vitals:   02/10/16 1808 02/10/16 1959 02/10/16 2139 02/10/16 2147  BP: 112/78 104/74  104/66  Pulse: 84 82  77  Resp: 10 13  16   Temp:    97.8 F (36.6 C)  TempSrc:    Oral  SpO2: 99% 100%  100%  Weight:   78.8 kg (173 lb 11.6 oz)   Height:   5' 1.5" (1.562 m)    Eyes: Anicteric no pallor. ENMT: No discharge from the ears eyes nose mouth. Neck: No mass felt. No neck rigidity. Respiratory: No rhonchi or crepitations. Cardiovascular: S1 and S2 heard no murmurs appreciated. Abdomen: Soft nontender bowel sounds present. No guarding or rigidity. Musculoskeletal: No edema. Pain on moving left leg. Skin: No rash. Skin appears warm. Neurologic: Alert awake oriented to time place and person. Moves all extremities. Psychiatric: Appears normal. Normal affect.   Labs on Admission: I have personally reviewed following labs and imaging studies  CBC:  Recent Labs Lab 02/10/16 1814  WBC 4.3  NEUTROABS 3.1  HGB 11.6*  HCT 35.2*  MCV 99.7  PLT 135*   Basic Metabolic Panel:  Recent Labs Lab 02/10/16 1814  NA 137  K 2.7*  CL 105  CO2 21*  GLUCOSE 84  BUN 52*  CREATININE 1.67*  CALCIUM 8.3*   GFR: Estimated Creatinine Clearance: 42.2 mL/min (by C-G formula based on SCr of 1.67 mg/dL (H)). Liver Function Tests:  Recent Labs Lab 02/10/16 1814  AST 120*  ALT 61*  ALKPHOS 228*  BILITOT 1.7*  PROT 6.3*  ALBUMIN 3.3*   No results for input(s): LIPASE, AMYLASE in the last 168 hours. No  results for input(s): AMMONIA in the last 168 hours. Coagulation Profile: No results for input(s): INR, PROTIME in the last 168 hours. Cardiac Enzymes: No results for input(s): CKTOTAL, CKMB, CKMBINDEX, TROPONINI in the last 168 hours. BNP (last 3 results) No results for input(s): PROBNP in the last 8760 hours. HbA1C: No results for input(s): HGBA1C in the last 72 hours. CBG:  Recent Labs Lab 02/10/16 1714  GLUCAP 135*   Lipid Profile: No results for input(s): CHOL, HDL, LDLCALC, TRIG, CHOLHDL, LDLDIRECT in the last 72 hours. Thyroid Function Tests: No results for input(s): TSH, T4TOTAL, FREET4, T3FREE, THYROIDAB in the last 72 hours. Anemia Panel: No results for input(s): VITAMINB12, FOLATE, FERRITIN, TIBC, IRON, RETICCTPCT in the last 72 hours. Urine analysis:    Component Value Date/Time   COLORURINE YELLOW 02/10/2016 2013   APPEARANCEUR CLOUDY (A) 02/10/2016 2013   LABSPEC 1.019 02/10/2016 2013   PHURINE 5.5 02/10/2016 2013   GLUCOSEU NEGATIVE 02/10/2016 2013   HGBUR LARGE (A) 02/10/2016 2013   BILIRUBINUR NEGATIVE 02/10/2016 2013   KETONESUR 40 (A) 02/10/2016 2013   PROTEINUR NEGATIVE 02/10/2016 2013   UROBILINOGEN 1.0 06/18/2014 1316   NITRITE POSITIVE (A) 02/10/2016 2013   LEUKOCYTESUR NEGATIVE 02/10/2016 2013   Sepsis Labs: @LABRCNTIP (procalcitonin:4,lacticidven:4) )No results found for this or any previous visit (from the past 240 hour(s)).   Radiological Exams on Admission: Ct Head Wo Contrast  Result Date: 02/10/2016 CLINICAL DATA:  Falling and off balance today. Found down on bed. Hypoglycemic. EXAM: CT HEAD WITHOUT CONTRAST TECHNIQUE: Contiguous axial images were obtained from the base of the skull through the vertex without intravenous contrast. COMPARISON:  None. FINDINGS: BRAIN: The ventricles and sulci are normal. No intraparenchymal hemorrhage, mass effect nor midline shift. No acute large vascular territory infarcts. No abnormal extra-axial fluid  collections. Basal cisterns are patent. VASCULAR: Unremarkable. SKULL/SOFT TISSUES: No skull fracture. No significant soft tissue swelling. ORBITS/SINUSES: The included ocular globes and orbital contents are normal.Mild LEFT sphenoid sinusitis. Mastoid air cells  are well aerated. OTHER: None. IMPRESSION: Normal CT HEAD. Electronically Signed   By: Awilda Metro M.D.   On: 02/10/2016 18:37    EKG: Independently reviewed. Normal sinus rhythm with a normal QTC. T-wave inversions.  Assessment/Plan Principal Problem:   AKI (acute kidney injury) (HCC) Active Problems:   Fibromyalgia   Chronic abdominal pain   AVN of femur (HCC)   Hypokalemia   Hypoglycemia    1. Acute renal failure - probably from poor oral intake and dehydration. Gently hydrate and follow metabolic panel. Hold diuretics for now. 2. Hypoglycemia and unresponsive episode - not sure if patient has dumping syndrome from known history of gastric bypass. Patient is on D5 normal at this time. Follow CBGs once D5 normal is discontinued. EKG shows normal sinus rhythm with QTC within acceptable limits. Check 2-D echo to make sure patient did not have any cardiac issues for her unresponsive episode. 3. Hypokalemia - probably from diuretics and poor oral intake. Replace and recheck. Check magnesium levels. 4. Avascular necrosis of the left hip - pain followed by orthopedic surgeon. 5. Fibromyalgia on Lyrica. 6. Hypertension on metoprolol. Holding orders for now. 7. Chronic abdominal pain and reflux status post gastric bypass and cholecystectomy with abnormal LFTs - follow LFTs. Patient used to follow with [facility] but due to insurance issues patient is planning to change to [employer]. 8. Hypothyroidism on Synthroid.  Patient at this time denies any alcohol abuse.   DVT prophylaxis: Lovenox. Code Status: Full code.  Family Communication: Discussed with patient.  Disposition Plan: Home.  Consults called: None.  Admission  status: Observation.    Eduard Clos MD [facility] Pager [phone].  If 7PM-7AM, please contact night-coverage [email or web address] Password [email or web address]  02/11/2016, 2:24 AM

## 2016-02-11 NOTE — Progress Notes (Signed)
Initial Nutrition Assessment  DOCUMENTATION CODES:   Not applicable  INTERVENTION:   Ensure Enlive po BID, each supplement provides 350 kcal and 20 grams of protein  NUTRITION DIAGNOSIS:   Inadequate oral intake related to poor appetite as evidenced by per patient/family report.  GOAL:   Patient will meet greater than or equal to 90% of their needs  MONITOR:   PO intake, Supplement acceptance  REASON FOR ASSESSMENT:   Malnutrition Screening Tool    ASSESSMENT:   40 y.o. female with history of chronic abdominal pain status post multiple procedures including gastric bypass cholecystectomy, hypertension, hypothyroidism was found to be unresponsive by patient's childcare aide.   Met with pt in room today. Pt reports that she feels worse than yesterday and has no appetite but she forced herself to eat breakfast because she knew she needed to. Pt reports eating 100% breakfast today. Pt reports that she has lost 175lbs r/t gastric bypass surgery; but recently her weights have fluctuated r/t severe edema in her lower legs. Pt reports that she has been on/off lasix recently. Pt reports constipation today but reports that she has been having "stomach problems" ever since her cholecystectomy last year. Pt reports sweats, tachycardia, diarrhea, nausea, and weakness postprandial. Spoke to pt about possible dumping syndrome and recommended increasing soluble fiber with each meal and avoiding sugary/high fat foods.   Medications reviewed and include: lovenox, synthroid, hydrocodone   Labs reviewed: K 3.2(L), CO2 21(L), BUN 32(H), Ca 8.1(L) adj. 9.14 wnl, Alk Phos 175(H), Alb 2.7(L), AST 94(H), Tbili 1.3(H) WBC 3.2(L)  Nutrition-Focused physical exam completed. Findings are no fat depletion, no muscle depletion, and severe pitting edema in bilateral lower extremeties.    Diet Order:  Diet regular Room service appropriate? Yes; Fluid consistency: Thin  Skin:  Reviewed, no issues  Last BM:   11/28  Height:   Ht Readings from Last 1 Encounters:  02/10/16 5' 1.5" (1.562 m)    Weight:   Wt Readings from Last 1 Encounters:  02/10/16 173 lb 11.6 oz (78.8 kg)    Ideal Body Weight:  48.9 kg  BMI:  Body mass index is 32.29 kg/m.  Estimated Nutritional Needs:   Kcal:  1200-1500kcal/day   Protein:  78-94g/day   Fluid:  >1.2L/day   EDUCATION NEEDS:   No education needs identified at this time  Koleen Distance, RD, LDN

## 2016-02-11 NOTE — Evaluation (Addendum)
Physical Therapy Evaluation Patient Details Name: Dorothy PeersCrystal Graveline MRN: [id number] DOB: 04/02/1975 Today's Date: 02/11/2016   History of Present Illness  40 yo female admitted with hypoglycemia, AKI, multiple falls. Hx of chronic abd pain, hip AVN, gastric bypass, HTN, fibromyalgia.  Clinical Impression  On eval, pt required Min assist for mobility. She was able to walk ~25 feet with a RW. Pain rated 9/10 at rest, 10/10 with activity. Pt only able to tolerate ~50% or < weightbearing on L LE during ambulation. Based on pt's performance and pain level, unsure if she can safely mobilize at home alone on today. Feel pt needs to be mobilizing at least at a supervision level since she lives alone. Will recommend RW and HHPT follow up. Will follow during hospital stay.     Follow Up Recommendations Home health PT;Supervision for mobility/OOB    Equipment Recommendations  Rolling walker with 5" wheels    Recommendations for Other Services       Precautions / Restrictions Precautions Precautions: Fall Restrictions Weight Bearing Restrictions: No      Mobility  Bed Mobility Overal bed mobility: Needs Assistance Bed Mobility: Supine to Sit     Supine to sit: HOB elevated;Min assist     General bed mobility comments: Assist for L LE. Increased time. Pt relied on bedrail.   Transfers Overall transfer level: Needs assistance Equipment used: Rolling walker (2 wheeled) Transfers: Sit to/from Stand Sit to Stand: Min assist         General transfer comment: Assist to rise, stabilize, control descent. VCs safety, technique, hand/LE placement  Ambulation/Gait Ambulation/Gait assistance: Min assist Ambulation Distance (Feet): 25 Feet Assistive device: Rolling walker (2 wheeled) Gait Pattern/deviations: Step-to pattern;Decreased stance time - left;Trunk flexed;Antalgic;Decreased weight shift to left     General Gait Details: Pt maintaining 50% or less weightbearing on L LE. She  actually remained on her toes for entire distance. Distance limited by pain.   Stairs            Wheelchair Mobility    Modified Rankin (Stroke Patients Only)       Balance Overall balance assessment: Needs assistance;History of Falls           Standing balance-Leahy Scale: Poor Standing balance comment: requires RW                             Pertinent Vitals/Pain Pain Assessment: 0-10 Pain Score: 10-Worst pain ever (9 at rest; 10 with activity) Pain Location: L hip/thigh Pain Descriptors / Indicators: Sore;Aching;Grimacing;Guarding Pain Intervention(s): Heat applied    Home Living Family/patient expects to be discharged to:: Private residence Living Arrangements: Alone   Type of Home: Mobile home Home Access: Stairs to enter Entrance Stairs-Rails: Right Entrance Stairs-Number of Steps: 4 Home Layout: One level Home Equipment: None      Prior Function Level of Independence: Independent               Hand Dominance        Extremity/Trunk Assessment   Upper Extremity Assessment: Overall WFL for tasks assessed Multiple bruises along bil UEs           Lower Extremity Assessment: Generalized weakness;LLE deficits/detail Noted bil lower leg redness and warmth.      Cervical / Trunk Assessment: Normal  Communication   Communication: No difficulties  Cognition Arousal/Alertness: Awake/alert Behavior During Therapy: WFL for tasks assessed/performed Overall Cognitive Status: Within Functional Limits for tasks assessed  General Comments      Exercises     Assessment/Plan    PT Assessment Patient needs continued PT services  PT Problem List Decreased strength;Decreased mobility;Decreased range of motion;Decreased activity tolerance;Decreased balance;Pain;Decreased knowledge of use of DME          PT Treatment Interventions DME instruction;Therapeutic activities;Gait training;Therapeutic  exercise;Patient/family education;Stair training;Functional mobility training    PT Goals (Current goals can be found in the Care Plan section)  Acute Rehab PT Goals Patient Stated Goal: less pain. to be able to eventually get a hip replacement PT Goal Formulation: With patient Time For Goal Achievement: 02/25/16 Potential to Achieve Goals: Good    Frequency Min 3X/week   Barriers to discharge        Co-evaluation               End of Session Equipment Utilized During Treatment: Gait belt Activity Tolerance: Patient limited by pain Patient left: in chair;with call bell/phone within reach           Time: 1540-1612 PT Time Calculation (min) (ACUTE ONLY): 32 min   Charges:   PT Evaluation $PT Eval Low Complexity: 1 Procedure PT Treatments $Gait Training: 8-22 mins   PT G Codes:        Rebeca AlertJannie Anaaya Fuster, MPT Pager: [phone]

## 2016-02-11 NOTE — Progress Notes (Signed)
   02/11/16 1620  PT Time Calculation  PT Start Time (ACUTE ONLY) 1540  PT Stop Time (ACUTE ONLY) 1612  PT Time Calculation (min) (ACUTE ONLY) 32 min  PT G-Codes **NOT FOR INPATIENT CLASS**  Functional Assessment Tool Used (clinical judgement)  Functional Limitation Mobility: Walking and moving around  Mobility: Walking and Moving Around Current Status (O1308(G8978) CJ  Mobility: Walking and Moving Around Goal Status (M5784(G8979) CI  PT General Charges  $$ ACUTE PT VISIT 1 Procedure  PT Evaluation  $PT Eval Low Complexity 1 Procedure  PT Treatments  $Gait Training 8-22 mins   Rebeca AlertJannie Arius Harnois, MPT [phone]

## 2016-02-11 NOTE — Progress Notes (Signed)
[facility].  Uneventful.

## 2016-02-11 NOTE — Procedures (Signed)
Electroencephalogram (EEG) Report  Date of study: 02/11/16  Requesting clinician: Midge MiniumArshad Kakrakandy, MD  Reason for study: Evaluate for seizure  Brief clinical history: This is a 40 year old woman admitted for an episode of unresponsiveness. EEG is now being performed for further evaluation.  Medications:  Current Facility-Administered Medications:  .  acetaminophen (TYLENOL) tablet 650 mg, 650 mg, Oral, Q6H PRN **OR** acetaminophen (TYLENOL) suppository 650 mg, 650 mg, Rectal, Q6H PRN, Eduard ClosArshad N Kakrakandy, MD .  ALPRAZolam Prudy Feeler(XANAX) tablet 0.5 mg, 0.5 mg, Oral, Once, Eduard ClosArshad N Kakrakandy, MD .  ALPRAZolam Prudy Feeler(XANAX) tablet 0.5 mg, 0.5 mg, Oral, BID PRN, Eduard ClosArshad N Kakrakandy, MD .  enoxaparin (LOVENOX) injection 40 mg, 40 mg, Subcutaneous, Q24H, Eduard ClosArshad N Kakrakandy, MD, 40 mg at 02/11/16 0911 .  feeding supplement (ENSURE ENLIVE) (ENSURE ENLIVE) liquid 237 mL, 237 mL, Oral, BID BM, Nishant Dhungel, MD, 237 mL at 02/11/16 1159 .  HYDROcodone-acetaminophen (NORCO) 10-325 MG per tablet 1 tablet, 1 tablet, Oral, Q6H PRN, Eduard ClosArshad N Kakrakandy, MD, 1 tablet at 02/11/16 0758 .  levothyroxine (SYNTHROID, LEVOTHROID) tablet 125 mcg, 125 mcg, Oral, QODAY, Eduard ClosArshad N Kakrakandy, MD, 125 mcg at 02/11/16 0758 .  [START ON 02/12/2016] levothyroxine (SYNTHROID, LEVOTHROID) tablet 137 mcg, 137 mcg, Oral, QODAY, Eduard ClosArshad N Kakrakandy, MD .  metoprolol succinate (TOPROL-XL) 24 hr tablet 50 mg, 50 mg, Oral, BID, Eduard ClosArshad N Kakrakandy, MD, 50 mg at 02/11/16 0910 .  multivitamin with minerals tablet 1 tablet, 1 tablet, Oral, Daily, Nishant Dhungel, MD, 1 tablet at 02/11/16 1159 .  nortriptyline (PAMELOR) capsule 75 mg, 75 mg, Oral, QHS, Eduard ClosArshad N Kakrakandy, MD .  ondansetron [facility](ZOFRAN) tablet 4 mg, 4 mg, Oral, Q6H PRN **OR** ondansetron (ZOFRAN) injection 4 mg, 4 mg, Intravenous, Q6H PRN, Eduard ClosArshad N Kakrakandy, MD .  pregabalin (LYRICA) capsule 225 mg, 225 mg, Oral, BID, Eduard ClosArshad N Kakrakandy, MD, 225 mg at 02/11/16 0910 .   RABEprazole (ACIPHEX) EC tablet 20 mg, 20 mg, Oral, BID AC, Dustin G Zeigler, RPH .  sucralfate (CARAFATE) tablet 1 g, 1 g, Oral, QID PRN, Eduard ClosArshad N Kakrakandy, MD .  topiramate (TOPAMAX) tablet 50 mg, 50 mg, Oral, QHS, Eduard ClosArshad N Kakrakandy, MD  Description: This is a routine EEG performed using standard international 10-20 electrode placement. A total of 18 channels are recorded, including one for the EKG. Wakefulness, drowsiness, and sleep are recorded.   Activating Maneuvers: None  Findings:  The EKG channel demonstrates a regular rhythm with a rate of 70 beats per minute.   The background consists of well formed theta activity with average frequency 7 Hz. The best dominant posterior rhythm is 7.5 Hz. This is symmetric and reacts as expected with eye opening.   There are no focal asymmetries. No epileptiform discharges are present. No seizures are recorded.   Drowsiness is recorded and is normal in appearance. Early stages of sleep are recorded and demonstrate normal architecture.    Impression: This is a normal awake, drowsy, sleep EEG.   Rhona Leavensimothy Oster, MD [facility]

## 2016-02-11 NOTE — Discharge Summary (Signed)
Physician Discharge Summary  Dorothy Dyer ZOX:[id number] DOB: 12/12/1975 DOA: 02/10/2016  PCP: Leo Grosser, MD  Admit date: 02/10/2016 Discharge date: 02/11/2016  Admitted From: home Disposition:  home  Recommendations for Outpatient Follow-up:  1. Follow up with PCP in 1-2 weeks   Home Health:PT Equipment/Devices: Rolling walker  Discharge Condition: fair CODE STATUS: Full code Diet recommendation: Regular    Discharge Diagnoses:  Principal Problem:   AKI (acute kidney injury) (HCC)   Active Problems:   Acute unresponsiveness   Anemia   Fibromyalgia   Chronic abdominal pain   AVN of femur (HCC)   Hypokalemia   Hypoglycemia   Marijuana abuse   Chronic narcotic use  Brief narrative/history of present illness 40 year old female with chronic abdominal pain status post multiple procedures including gastric bypass, cholecystectomy, avascular necrosis of left hip on chronic pain medications (follows with Dr. Veda Canning), hypothyroidism, hypertension, tobacco and marijuana use was found unresponsive by patient's childcare aide. Her childcare aide was unable to open the door and had to bring in health. She was lying on the floor unresponsive. EMS was called who checked her blood sugar was found to be 55. She was given some juice and was brought to the ED. In the ED labs showed acute kidney injury and hypokalemia. Also showed mild transaminitis. She denied any headache, dizziness, nausea, vomiting, chest pain, shortness of breath, abdominal pain, dysuria, diarrhea. She reports taking diuretic (recently switched to metazolone by her PCP for increased bilateral leg swellings). Patient reported that only yesterday she had not eaten anything, was feeling weak and was having unsteady gait. CT head done in the ED was unremarkable. She had a fall at home recently for which x-ray of the left knee was done which was negative for injury. Patient placed on observation.  Hospital  course Principal problem Hypoglycemia and unresponsiveness I suspect this is associated with poor by mouth intake. Denies similar symptoms in the past. Reported being unsteady which is secondary to dehydration. Urine toxin was positive for opiates and marijuana. A.m. Cortisol was normal CBG has remained stable. 2-D echo done showed normal EF and no wall motion abnormality. Normal valves and negative for PFO. EEG negative for seizures.   Acute kidney injury Secondary to poor by mouth intake and dehydration. Held diuretic and given IV fluids, renal function normalized. May resume her Lasix as needed given her significant leg swelling but instructed on keeping herself hydrated. Uses metolazone and Lasix 1-3 times a week.  Hypokalemia Secondary to dehydration and diuretic. Replenished. Resume home potassium supplement.  Avascular necrosis of left hip Being followed by Dr. Veda Canning. On chronic pain medications (Vicodin)  Fibromyalgia On leg at home.  Essential hypertension Resume metoprolol  Chronic abdominal pain On home medications.  Transaminitis Mild. No clear etiology. Denies alcohol use. Repeat LFTs much improved.  Hypothyroidism Continue Synthroid.   Generalized weakness and frequent falls Unclear if excessive narcotic and Xanax use is contributing to this. Patient also uses marijuana (suspect frequently). Avascular necrosis with pain is also contributed to painful gait. Seen by PT and recommend home health PT and rolling walker.  Tobacco use Counseled on smoking and marijuana cessation.  Family communication: None at bedside  Procedure: Head CT 2-D echo EEG  Consults: None Disposition: Home  Discharge Instructions  Discharge Instructions    For home use only DME 4 wheeled rolling walker with seat    Complete by:  As directed    Patient needs a walker to treat with the following condition:  Frequent falls       Medication List    TAKE these medications    ACIPHEX 20 MG tablet Generic drug:  RABEprazole TAKE 1 TABLET BY MOUTH TWICE A DAY What changed:  See the new instructions.   ALPRAZolam 0.5 MG tablet Commonly known as:  XANAX TAKE 1 TABLET BY MOUTH TWICE A DAY AS NEEDED What changed:  See the new instructions.   feeding supplement (ENSURE ENLIVE) Liqd Take 237 mLs by mouth 2 (two) times daily between meals. Start taking on:  02/12/2016   furosemide 40 MG tablet Commonly known as:  LASIX Take 1 tablet (40 mg total) by mouth daily as needed. What changed:  reasons to take this   HYDROcodone-acetaminophen 10-325 MG tablet Commonly known as:  NORCO Take 1 tablet by mouth 3 (three) times daily as needed. Must last 30 days What changed:  reasons to take this  additional instructions   levothyroxine 137 MCG tablet Commonly known as:  SYNTHROID, LEVOTHROID TAKE 1 TABLET (137 MCG TOTAL) BY MOUTH DAILY BEFORE BREAKFAST. What changed:  See the new instructions.   levothyroxine 125 MCG tablet Commonly known as:  SYNTHROID, LEVOTHROID TAKE 1 TABLET EVERY DAY What changed:  See the new instructions.   LYRICA 225 MG capsule Generic drug:  pregabalin TAKE ONE CAPSULE BY MOUTH TWICE A DAY What changed:  See the new instructions.   metolazone 5 MG tablet Commonly known as:  ZAROXOLYN Take 1 tablet (5 mg total) by mouth daily. Take zaroxylyn 5 mg 30 minutes before lasix 1-2 times a week.   metoprolol succinate 50 MG 24 hr tablet Commonly known as:  TOPROL-XL Take 1 tablet (50 mg total) by mouth 2 (two) times daily.   mometasone 0.1 % cream Commonly known as:  ELOCON APPLY TO AFFECTED AREA EVERY DAY What changed:  See the new instructions.   nortriptyline 75 MG capsule Commonly known as:  PAMELOR TAKE 1 CAPSULE (75 MG TOTAL) BY MOUTH AT BEDTIME.   ondansetron 4 MG tablet Commonly known as:  ZOFRAN TAKE 1 TABLET (4 MG TOTAL) BY MOUTH EVERY 8 (EIGHT) HOURS AS NEEDED FOR NAUSEA OR VOMITING.   potassium chloride SA 20 MEQ  tablet Commonly known as:  K-DUR,KLOR-CON Take 1 tablet (20 mEq total) by mouth 2 (two) times daily. What changed:  when to take this  reasons to take this   simethicone 80 MG chewable tablet Commonly known as:  MYLICON Chew 80 mg by mouth every 6 (six) hours as needed for flatulence.   sucralfate 1 g tablet Commonly known as:  CARAFATE Take 1 tablet (1 g total) by mouth 4 (four) times daily. What changed:  when to take this  reasons to take this   topiramate 50 MG tablet Commonly known as:  TOPAMAX TAKE 1 TABLET BY MOUTH AT BEDTIME What changed:  See the new instructions.            Durable Medical Equipment        Start     Ordered   02/11/16 0000  For home use only DME 4 wheeled rolling walker with seat    Question:  Patient needs a walker to treat with the following condition  Answer:  Frequent falls   02/11/16 1629     Follow-up Information    PICKARD,WARREN TOM, MD. Schedule an appointment as soon as possible for a visit in 1 week(s).   Specialty:  Family Medicine Contact information: [phone] [place]  [id number] [phone]          Allergies  Allergen Reactions  . Ibuprofen Nausea Only    Bad stomach pains  . Toradol [Ketorolac Tromethamine] Anxiety  . Aspirin Hives  . Cleocin [Clindamycin Hcl] Itching and Rash      Procedures/Studies: Ct Head Wo Contrast  Result Date: 02/10/2016 CLINICAL DATA:  Falling and off balance today. Found down on bed. Hypoglycemic. EXAM: CT HEAD WITHOUT CONTRAST TECHNIQUE: Contiguous axial images were obtained from the base of the skull through the vertex without intravenous contrast. COMPARISON:  None. FINDINGS: BRAIN: The ventricles and sulci are normal. No intraparenchymal hemorrhage, mass effect nor midline shift. No acute large vascular territory infarcts. No abnormal extra-axial fluid collections. Basal cisterns are patent. VASCULAR: Unremarkable. SKULL/SOFT TISSUES: No skull fracture. No  significant soft tissue swelling. ORBITS/SINUSES: The included ocular globes and orbital contents are normal.Mild LEFT sphenoid sinusitis. Mastoid air cells are well aerated. OTHER: None. IMPRESSION: Normal CT HEAD. Electronically Signed   By: Awilda Metroourtnay  Bloomer M.D.   On: 02/10/2016 18:37   Dg Knee Complete 4 Views Left  Result Date: 01/13/2016 CLINICAL DATA:  Fall 1 week ago with left knee pain, initial encounter EXAM: LEFT KNEE - COMPLETE 4+ VIEW COMPARISON:  None. FINDINGS: No evidence of fracture, dislocation, or joint effusion. No evidence of arthropathy or other focal bone abnormality. Soft tissues are unremarkable. IMPRESSION: No acute abnormality noted. Electronically Signed   By: Alcide CleverMark  Lukens M.D.   On: 01/13/2016 13:50    2d echo - Left ventricle: The cavity size was normal. Systolic function was   normal. The estimated ejection fraction was in the range of 60%   to 65%. Wall motion was normal; there were no regional wall   motion abnormalities. Left ventricular diastolic function   parameters were normal. - Atrial septum: No defect or patent foramen ovale was identified.   Subjective: Complains of pain in her left hip.  Discharge Exam: Vitals:   02/11/16 0647 02/11/16 1415  BP: 92/60 110/67  Pulse: 85 86  Resp: 16 18  Temp: 97.7 F (36.5 C) 97 F (36.1 C)   Vitals:   02/10/16 2139 02/10/16 2147 02/11/16 0647 02/11/16 1415  BP:  104/66 92/60 110/67  Pulse:  77 85 86  Resp:  16 16 18   Temp:  97.8 F (36.6 C) 97.7 F (36.5 C) 97 F (36.1 C)  TempSrc:  Oral Oral Oral  SpO2:  100% 99% 97%  Weight: 78.8 kg (173 lb 11.6 oz)     Height: 5' 1.5" (1.562 m)       General:Middle aged female not in distress HEENT: No pallor, moist mucosa, supple neck Cardiovascular: Normal S1 and S2, no murmurs rubs or gallop Respiratory: CTA bilaterally, no wheezing, no rhonchi Abdominal: Soft, nondistended, nontender, bowel sounds present Extremities: Warm, 1+ pitting edema  bilaterally, limited mobility of the left hip due to pain CNS: Alert and oriented    The results of significant diagnostics from this hospitalization (including imaging, microbiology, ancillary and laboratory) are listed below for reference.     Microbiology: No results found for this or any previous visit (from the past 240 hour(s)).   Labs: BNP (last 3 results) No results for input(s): BNP in the last 8760 hours. Basic Metabolic Panel:  Recent Labs Lab 02/10/16 1814 02/11/16 0447  NA 137 139  K 2.7* 3.2*  CL 105 109  CO2 21* 21*  GLUCOSE 84 86  BUN 52* 32*  CREATININE 1.67* 0.89  CALCIUM 8.3* 8.1*  MG  --  1.8   Liver Function Tests:  Recent Labs Lab 02/10/16 1814 02/11/16 0447  AST 120* 94*  ALT 61* 51  ALKPHOS 228* 175*  BILITOT 1.7* 1.3*  PROT 6.3* 5.3*  ALBUMIN 3.3* 2.7*   No results for input(s): LIPASE, AMYLASE in the last 168 hours. No results for input(s): AMMONIA in the last 168 hours. CBC:  Recent Labs Lab 02/10/16 1814 02/11/16 0447  WBC 4.3 3.2*  NEUTROABS 3.1  --   HGB 11.6* 10.3*  HCT 35.2* 30.8*  MCV 99.7 97.8  PLT 135* 135*   Cardiac Enzymes: No results for input(s): CKTOTAL, CKMB, CKMBINDEX, TROPONINI in the last 168 hours. BNP: Invalid input(s): POCBNP CBG:  Recent Labs Lab 02/10/16 1714 02/11/16 0311 02/11/16 0825 02/11/16 1236  GLUCAP 135* 102* 73 82   D-Dimer No results for input(s): DDIMER in the last 72 hours. Hgb A1c No results for input(s): HGBA1C in the last 72 hours. Lipid Profile No results for input(s): CHOL, HDL, LDLCALC, TRIG, CHOLHDL, LDLDIRECT in the last 72 hours. Thyroid function studies No results for input(s): TSH, T4TOTAL, T3FREE, THYROIDAB in the last 72 hours.  Invalid input(s): FREET3 Anemia work up No results for input(s): VITAMINB12, FOLATE, FERRITIN, TIBC, IRON, RETICCTPCT in the last 72 hours. Urinalysis    Component Value Date/Time   COLORURINE YELLOW 02/10/2016 2013   APPEARANCEUR  CLOUDY (A) 02/10/2016 2013   LABSPEC 1.019 02/10/2016 2013   PHURINE 5.5 02/10/2016 2013   GLUCOSEU NEGATIVE 02/10/2016 2013   HGBUR LARGE (A) 02/10/2016 2013   BILIRUBINUR NEGATIVE 02/10/2016 2013   KETONESUR 40 (A) 02/10/2016 2013   PROTEINUR NEGATIVE 02/10/2016 2013   UROBILINOGEN 1.0 06/18/2014 1316   NITRITE POSITIVE (A) 02/10/2016 2013   LEUKOCYTESUR NEGATIVE 02/10/2016 2013   Sepsis Labs Invalid input(s): PROCALCITONIN,  WBC,  LACTICIDVEN Microbiology No results found for this or any previous visit (from the past 240 hour(s)).   Time coordinating discharge: <30 minutes  SIGNED:   Eddie North, MD  [facility] 02/11/2016, 4:30 PM Pager   If 7PM-7AM, please contact night-coverage [email or web address] Password [email or web address]

## 2016-02-11 NOTE — Progress Notes (Signed)
Discharge to home. Patient owned meds returned. D/c instructions and follow up appointments discussed with patient ,verbalized understanding. PIV removed by NT.

## 2016-02-11 NOTE — Care Management Note (Signed)
Case Management Note  Patient Details  Name: Dorothy Dyer MRN: [id number] Date of Birth: 02/22/1976  Subjective/Objective:   [place]  [facility]  EDCM provided patient with a rolling walker.  Patient reports the walker helps her a lot.  Patient reports she has a brother who lives in [place] and can call if she needs anything.  Discussed if patient does not qualify for hh services to discuss out patient PT with her pcp.  Patient reports her doctor has mentioned this before.  Patient reports she needs surgery.  [facility] of [facility].              Action/Plan: Discharge home with home health services.   Expected Discharge Date:                  Expected Discharge Plan:  Home w Home Health Services  In-House Referral:     Discharge planning Services  CM Consult  Post Acute Care Choice:  Durable Medical Equipment, Home Health Choice offered to:  Patient  DME Arranged:  Walker rolling, Walker rolling with seat DME Agency:  [facility]  HH Arranged:  [facility], [facility]  [facility]  Status of Service:  Completed, signed off  If discussed at Long Length of Stay Meetings, dates discussed:    Additional CommentsRadford Pax:  Dorothy Garate, RN 02/11/2016, 6:24 PM

## 2016-02-11 NOTE — Progress Notes (Signed)
  Echocardiogram 2D Echocardiogram has been performed.  Dorothy SavoyCasey N Terryn Dyer 02/11/2016, 2:47 PM

## 2016-02-13 ENCOUNTER — Telehealth: Payer: Self-pay | Admitting: Family Medicine

## 2016-02-13 NOTE — Telephone Encounter (Signed)
Pt called 12/2 in the afternoon.  She called with very confusion story, initially started with being admitted for passing out, had AKI, given some fluids and released, No stroke or MI,  Then starting disussing her son and difficulty at home, when asked what her concern was She stated "My pain is so bad and the meds aren't helping" States she fells multiple times and her hip hurts. Note she was seen twice this month for pain and falls,I saw her last week, she did not get xray. She is on chronic pain medication, states" I Have been taking 2 at at time and its not helping". Advised she can go back to ER for evaluation and pain, states she does not want to go back to ER, has no one to watch her son. She then stated she can just wait until Monday to see her doctor.   I reviewed 24 hour evaluation there is concern for abuse of narcotics/benzos, marijuana use. She also had not been drinking fluids and taking her diuretics.  Her glucose in the field was 55, she had not eaten.  Will schedule with PCP Monday

## 2016-02-17 ENCOUNTER — Encounter: Payer: Self-pay | Admitting: Family Medicine

## 2016-02-17 ENCOUNTER — Ambulatory Visit (INDEPENDENT_AMBULATORY_CARE_PROVIDER_SITE_OTHER): Payer: Medicare Other | Admitting: Family Medicine

## 2016-02-17 VITALS — BP 130/80 | HR 104 | Temp 98.3°F | Resp 16 | Ht 61.5 in | Wt 177.0 lb

## 2016-02-17 DIAGNOSIS — R109 Unspecified abdominal pain: Secondary | ICD-10-CM

## 2016-02-17 DIAGNOSIS — M87052 Idiopathic aseptic necrosis of left femur: Secondary | ICD-10-CM

## 2016-02-17 DIAGNOSIS — Z09 Encounter for follow-up examination after completed treatment for conditions other than malignant neoplasm: Secondary | ICD-10-CM

## 2016-02-17 DIAGNOSIS — F1190 Opioid use, unspecified, uncomplicated: Secondary | ICD-10-CM | POA: Diagnosis not present

## 2016-02-17 DIAGNOSIS — G8929 Other chronic pain: Secondary | ICD-10-CM | POA: Diagnosis not present

## 2016-02-17 LAB — COMPLETE METABOLIC PANEL WITH GFR
ALBUMIN: 3.3 g/dL — AB (ref 3.6–5.1)
ALK PHOS: 117 U/L — AB (ref 33–115)
ALT: 25 U/L (ref 6–29)
AST: 25 U/L (ref 10–30)
BUN: 15 mg/dL (ref 7–25)
CHLORIDE: 106 mmol/L (ref 98–110)
CO2: 25 mmol/L (ref 20–31)
Calcium: 8.5 mg/dL — ABNORMAL LOW (ref 8.6–10.2)
Creat: 0.52 mg/dL (ref 0.50–1.10)
GFR, Est African American: >89 mL/min (ref >=60)
GLUCOSE: 66 mg/dL — AB (ref 70–99)
POTASSIUM: 3.8 mmol/L (ref 3.5–5.3)
SODIUM: 142 mmol/L (ref 135–146)
Total Bilirubin: 0.5 mg/dL (ref 0.2–1.2)
Total Protein: 5.6 g/dL — ABNORMAL LOW (ref 6.1–8.1)

## 2016-02-17 LAB — CBC WITH DIFFERENTIAL/PLATELET
Basophils Absolute: 0 cells/uL (ref 0–200)
Basophils Relative: 0 %
EOS PCT: 1 %
Eosinophils Absolute: 87 cells/uL (ref 15–500)
HCT: 35.7 % (ref 35.0–45.0)
HEMOGLOBIN: 11.6 g/dL — AB (ref 12.0–15.0)
LYMPHS ABS: 1653 {cells}/uL (ref 850–3900)
LYMPHS PCT: 19 %
MCH: 32 pg (ref 27.0–33.0)
MCHC: 32.5 g/dL (ref 32.0–36.0)
MCV: 98.3 fL (ref 80.0–100.0)
MONOS PCT: 9 %
MPV: 10.9 fL (ref 7.5–12.5)
Monocytes Absolute: 783 cells/uL (ref 200–950)
NEUTROS PCT: 71 %
Neutro Abs: 6177 cells/uL (ref 1500–7800)
PLATELETS: 267 10*3/uL (ref 140–400)
RBC: 3.63 MIL/uL — AB (ref 3.80–5.10)
RDW: 15.4 % — AB (ref 11.0–15.0)
WBC: 8.7 10*3/uL (ref 3.8–10.8)

## 2016-02-17 NOTE — Progress Notes (Signed)
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 taking differently: Take 1 tablet by mouth 3 (three) times daily as needed for moderate pain. Must last 30 days) 90 tablet 0  . levothyroxine (SYNTHROID, LEVOTHROID) 125 MCG tablet TAKE 1 TABLET EVERY DAY (Patient taking differently: TAKE 125mg  TABLET by mouth  EVERY other DAY) 90 tablet 0  . levothyroxine (SYNTHROID, LEVOTHROID) 137 MCG tablet TAKE 1 TABLET (137 MCG TOTAL) BY MOUTH DAILY BEFORE BREAKFAST. (Patient taking differently: TAKE 1 TABLET (137 MCG TOTAL) BY MOUTH every other day BEFORE BREAKFAST.) 30 tablet 3  . LYRICA 225 MG capsule TAKE ONE CAPSULE BY MOUTH TWICE A DAY (Patient taking differently: TAKE 225mg  CAPSULE BY MOUTH TWICE A DAY) 60 capsule 2  . metolazone (ZAROXOLYN) 5 MG tablet Take 1 tablet (5 mg total) by mouth daily. Take zaroxylyn 5 mg 30 minutes before lasix 1-2 times a week. 30 tablet 1  . metoprolol succinate (TOPROL-XL) 50 MG 24 hr tablet Take 1 tablet (50 mg total) by mouth 2 (two) times daily. 180 tablet 3  . mometasone (ELOCON) 0.1 % cream APPLY TO AFFECTED AREA EVERY DAY (Patient taking differently: APPLY TO AFFECTED AREA daily prn for scorisis) 45 g 0  . nortriptyline (PAMELOR) 75 MG capsule TAKE 1 CAPSULE (75 MG TOTAL) BY MOUTH AT BEDTIME. 30 capsule  1  . ondansetron (ZOFRAN) 4 MG tablet TAKE 1 TABLET (4 MG TOTAL) BY MOUTH EVERY 8 (EIGHT) HOURS AS NEEDED FOR NAUSEA OR VOMITING. 20 tablet 0  . potassium chloride SA (K-DUR,KLOR-CON) 20 MEQ tablet Take 1 tablet (20 mEq total) by mouth 2 (two) times daily. (Patient taking differently: Take 20 mEq by mouth 2 (two) times daily as needed (when taking lasix). ) 60 tablet 3  . simethicone (MYLICON) 80 MG chewable tablet Chew 80 mg by mouth every 6 (six) hours as needed for flatulence.    . sucralfate (CARAFATE) 1 g tablet Take 1 tablet (1 g total) by mouth 4 (four) times daily. (Patient taking differently: Take 1 g by mouth 4 (four) times daily as needed (stomach coating). ) 120 tablet 0  . topiramate (TOPAMAX) 50 MG tablet TAKE 1 TABLET BY MOUTH AT BEDTIME (Patient taking differently: TAKE 50mg  TABLET BY MOUTH AT BEDTIME) 30 tablet 3  . furosemide (LASIX) 40 MG tablet Take 1 tablet (40 mg total) by mouth daily as needed. (Patient not taking: Reported on 02/17/2016) 30 tablet 3   No current facility-administered medications on file prior to visit.    Allergies  Allergen Reactions  . Ibuprofen Nausea Only    Bad stomach pains  . Toradol [Ketorolac Tromethamine] Anxiety  . Aspirin Hives  . Cleocin [Clindamycin Hcl] Itching and Rash   Social History   Social History  . Marital status: Divorced    Spouse name: N/A  . Number of children: N/A  . Years of education: N/A   Occupational History  . Not on file.   Social History Main Topics  . Smoking status: Current Every Day Smoker    Packs/day: 0.50  Types: Cigarettes  . Smokeless tobacco: Never Used  . Alcohol use 6.0 oz/week    10 Shots of liquor per week  . Drug use: No  . Sexual activity: Yes    Birth control/ protection: Pill   Other Topics Concern  . Not on file   Social History Narrative  . No narrative on file      Review of Systems  All other systems reviewed and are negative.      Objective:   Physical Exam    Constitutional: She appears well-developed and well-nourished. No distress.  Cardiovascular: Normal rate, regular rhythm and normal heart sounds.   No murmur heard. Pulmonary/Chest: Effort normal and breath sounds normal.  Abdominal: Soft. Bowel sounds are normal. She exhibits no distension. There is no tenderness. There is no rebound and no guarding.  Musculoskeletal: She exhibits no edema.       Left hip: She exhibits decreased range of motion, decreased strength and tenderness. She exhibits no swelling, no crepitus and no deformity.  Skin: She is not diaphoretic. No erythema.          Assessment & Plan:  Avascular necrosis of left femur (HCC)  Chronic abdominal pain  Chronic, continuous use of opioids Patient must go to a pain clinic. #1 there is no excuse to continue to delay the treatment of her dental problems so that she can get her hip fixed. Off the record, I'm concerned by this behavior. Therefore I believe that the patient is more appropriately managed by pain specialist. I have demanded that the patient see the dentist, have her dental problems corrected so that her orthopedist can operate to correct the avscular necrosis of the left hip. It is her left hip pain that the patient states causes her to lose her balance and fall. The second issue is her chronic GI pain. She has called and canceled her gastroenterology appointment on 3 separate occasions. I demanded the patient now see the gastroenterologist at [facility] and stop delaying the referral. I believe she is malnourished. I want her to start drinking ensure 1 can twice a day to increase her protein intake and correct her protein calorie malnutrition. I believe that some of the edema is due to third spacing secondary to low oncotic pressure due to malnutrition. Therefore I've asked the patient to discontinue her diuretic use and improve her nutritional intake. I want her to eat a protein rich diet high in healthy fruits and  vegetables. I want her to avoid junk food and sodas and cakes and cookies. These referrals are unconditional. She must see these specialists or else I can no longer be her primary care physician. I truly believe her syncope was related to a combination of dehydration, protein calorie malnutrition, hyperglycemia secondary to protein calorie malnutrition, hypokalemia, marijuana use, and opiate use.

## 2016-02-21 ENCOUNTER — Telehealth: Payer: Self-pay | Admitting: Family Medicine

## 2016-02-21 NOTE — Telephone Encounter (Signed)
Patient called requesting an order for a cane and toilet seat chair to make it easier to use the restroom she would like this faxed to [facility] [phone]. She states that [place] from [facility] who is a Child psychotherapistsocial worker was supposed to contact our office on Friday about referring patient [facility] for a tooth that needs to be pulled. Her call back number is [phone].    Pt's cb# [phone]

## 2016-02-22 ENCOUNTER — Telehealth: Payer: Self-pay | Admitting: Family Medicine

## 2016-02-22 NOTE — Telephone Encounter (Signed)
[facility] seat?

## 2016-02-22 NOTE — Telephone Encounter (Signed)
Omeprazole 40 poqday 

## 2016-02-22 NOTE — Telephone Encounter (Signed)
ok 

## 2016-02-22 NOTE — Telephone Encounter (Signed)
Insurance will not cover Aciphex w/out PA can we change her to either Nexium, Protonix or omeprazole??

## 2016-02-23 NOTE — Telephone Encounter (Signed)
Pt aware that I will be sending order for cane and elevated toilet seat and that the referral is in for [facility] and kim will call once that is set up.

## 2016-02-23 NOTE — Telephone Encounter (Signed)
Spoke to pt and ins will cover name brand and she has already gotten it filled. No change in meds needed at this time.

## 2016-02-24 NOTE — Telephone Encounter (Signed)
Orders and LOV notes faxed 02/23/16

## 2016-02-25 ENCOUNTER — Ambulatory Visit (INDEPENDENT_AMBULATORY_CARE_PROVIDER_SITE_OTHER): Payer: Medicare Other | Admitting: Family Medicine

## 2016-02-25 VITALS — BP 108/60 | HR 100 | Temp 97.9°F | Resp 18 | Ht 61.5 in | Wt 183.0 lb

## 2016-02-25 DIAGNOSIS — R600 Localized edema: Secondary | ICD-10-CM | POA: Diagnosis not present

## 2016-02-25 MED ORDER — BUMETANIDE 2 MG PO TABS: 2.0000 mg | ORAL_TABLET | Freq: Every day | ORAL | 3 refills | Status: DC

## 2016-02-25 NOTE — Progress Notes (Signed)
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 fruits and vegetables. I want her to avoid junk food and sodas and cakes and cookies. These referrals are unconditional. She must see these specialists or else I can no longer be her primary care physician. I truly believe her syncope was related to a combination of dehydration, protein calorie malnutrition, hyperglycemia secondary to protein calorie malnutrition, hypokalemia, marijuana use, and opiate use.  02/25/16 Patient discontinued her fluid pill as I directed her to do. She's gained 6 pounds since I last saw her. She has +1 pitting edema in her legs distal to the knee. She denies chest pain shortness of breath or dyspnea on exertion. The orthopnea or paroxysmal nocturnal dyspnea Past Medical History:  Diagnosis Date  . Acid reflux   . Anemia   . Anxiety   . Arthritis   . AVN of femur (HCC)    left hip  . Clotting disorder (HCC)    undetermined  . Cystitis   . 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 Hypertension   . Neuromuscular disorder (HCC)   . Osteoporosis   . Psoriasis   . Thyroid disease    Past Surgical History:  Procedure Laterality Date  . CHOLECYSTECTOMY    . ESOPHAGOGASTRODUODENOSCOPY  10/2014   at [facility], was normal. Dr. Merri Brunette  . GASTRIC BYPASS  05/2001  . SMALL INTESTINE  SURGERY  2004   exploratory   Current Outpatient Prescriptions on File Prior to Visit  Medication Sig Dispense Refill  . ACIPHEX 20 MG tablet TAKE 1 TABLET BY MOUTH TWICE A DAY (Patient taking differently: TAKE 20mg  TABLET BY MOUTH TWICE A DAY) 60 tablet 5  . ALPRAZolam (XANAX) 0.5 MG tablet TAKE 1 TABLET BY MOUTH TWICE A DAY AS NEEDED (Patient taking differently: TAKE 0.5 TABLET BY MOUTH TWICE A DAY AS NEEDED for anxiety) 60 tablet 1  . feeding supplement, ENSURE ENLIVE, (ENSURE ENLIVE) LIQD Take 237 mLs by mouth 2 (two) times daily between meals. 237 mL 12  . HYDROcodone-acetaminophen (NORCO) 10-325 MG tablet Take 1 tablet by mouth 3 (three) times daily as needed. Must last 30 days (Patient taking differently: Take 1 tablet by mouth 3 (three) times daily as needed for moderate pain. Must last 30 days) 90 tablet 0  . levothyroxine (SYNTHROID, LEVOTHROID) 125 MCG tablet TAKE 1 TABLET EVERY DAY (Patient taking differently: TAKE 125mg  TABLET by mouth  EVERY other DAY) 90 tablet 0  . levothyroxine (SYNTHROID, LEVOTHROID) 137 MCG tablet TAKE 1 TABLET (137 MCG TOTAL) BY MOUTH DAILY BEFORE BREAKFAST. (Patient taking differently: TAKE 1 TABLET (137 MCG TOTAL) BY MOUTH every other day BEFORE BREAKFAST.) 30 tablet 3  . LYRICA 225 MG capsule TAKE ONE CAPSULE BY MOUTH TWICE A DAY (Patient taking differently: TAKE 225mg  CAPSULE BY MOUTH TWICE A DAY) 60 capsule 2  . metoprolol succinate (TOPROL-XL) 50 MG 24 hr tablet Take 1 tablet (50 mg total) by mouth 2 (two) times daily. 180 tablet 3  . mometasone (ELOCON) 0.1 % cream APPLY TO AFFECTED AREA EVERY DAY (Patient taking differently: APPLY TO AFFECTED AREA daily prn for scorisis) 45 g 0  . nortriptyline (PAMELOR) 75 MG capsule TAKE 1 CAPSULE (75 MG TOTAL) BY  MOUTH AT BEDTIME. 30 capsule 1  . ondansetron (ZOFRAN) 4 MG tablet TAKE 1 TABLET (4 MG TOTAL) BY MOUTH EVERY 8 (EIGHT) HOURS AS NEEDED FOR NAUSEA OR VOMITING. 20 tablet 0  . potassium chloride SA  (K-DUR,KLOR-CON) 20 MEQ tablet Take 1 tablet (20 mEq total) by mouth 2 (two) times daily. (Patient taking differently: Take 20 mEq by mouth 2 (two) times daily as needed (when taking lasix). ) 60 tablet 3  . simethicone (MYLICON) 80 MG chewable tablet Chew 80 mg by mouth every 6 (six) hours as needed for flatulence.    . sucralfate (CARAFATE) 1 g tablet Take 1 tablet (1 g total) by mouth 4 (four) times daily. (Patient taking differently: Take 1 g by mouth 4 (four) times daily as needed (stomach coating). ) 120 tablet 0  . topiramate (TOPAMAX) 50 MG tablet TAKE 1 TABLET BY MOUTH AT BEDTIME (Patient taking differently: TAKE 50mg  TABLET BY MOUTH AT BEDTIME) 30 tablet 3  . furosemide (LASIX) 40 MG tablet Take 1 tablet (40 mg total) by mouth daily as needed. (Patient not taking: Reported on 02/25/2016) 30 tablet 3  . metolazone (ZAROXOLYN) 5 MG tablet Take 1 tablet (5 mg total) by mouth daily. Take zaroxylyn 5 mg 30 minutes before lasix 1-2 times a week. (Patient not taking: Reported on 02/25/2016) 30 tablet 1   No current facility-administered medications on file prior to visit.    Allergies  Allergen Reactions  . Ibuprofen Nausea Only    Bad stomach pains  . Toradol [Ketorolac Tromethamine] Anxiety  . Aspirin Hives  . Cleocin [Clindamycin Hcl] Itching and Rash   Social History   Social History  . Marital status: Divorced    Spouse name: N/A  . Number of children: N/A  . Years of education: N/A   Occupational History  . Not on file.   Social History Main Topics  . Smoking status: Current Every Day Smoker    Packs/day: 0.50    Types: Cigarettes  . Smokeless tobacco: Never Used  . Alcohol use 6.0 oz/week    10 Shots of liquor per week  . Drug use: No  . Sexual activity: Yes    Birth control/ protection: Pill   Other Topics Concern  . Not on file   Social History Narrative  . No narrative on file      Review of Systems  All other systems reviewed and are negative.        Objective:   Physical Exam  Constitutional: She appears well-developed and well-nourished. No distress.  Neck: No JVD present.  Cardiovascular: Normal rate, regular rhythm and normal heart sounds.   No murmur heard. Pulmonary/Chest: Effort normal and breath sounds normal.  Abdominal: Soft. Bowel sounds are normal. She exhibits no distension. There is no tenderness. There is no rebound and no guarding.  Musculoskeletal: She exhibits edema.       Left hip: She exhibits decreased range of motion, decreased strength and tenderness. She exhibits no swelling, no crepitus and no deformity.  Skin: She is not diaphoretic. No erythema.          Assessment & Plan:  Bilateral leg edema Resume Coumadin at 2 mg by mouth daily. Also resume Klor-Con 20 mEq by mouth daily. Recheck next week. Patient increased diuresed approximately 6 pounds. She will likely need to stay on a low-dose Bumex to achieve this.

## 2016-02-26 ENCOUNTER — Other Ambulatory Visit: Payer: Self-pay | Admitting: Family Medicine

## 2016-02-29 ENCOUNTER — Ambulatory Visit: Payer: Medicare Other | Admitting: Family Medicine

## 2016-03-02 ENCOUNTER — Encounter: Payer: Self-pay | Admitting: Family Medicine

## 2016-03-02 ENCOUNTER — Ambulatory Visit (INDEPENDENT_AMBULATORY_CARE_PROVIDER_SITE_OTHER): Payer: Medicare Other | Admitting: Family Medicine

## 2016-03-02 VITALS — BP 112/78 | HR 84 | Temp 98.0°F | Resp 14 | Ht 61.5 in | Wt 179.0 lb

## 2016-03-02 DIAGNOSIS — G5631 Lesion of radial nerve, right upper limb: Secondary | ICD-10-CM | POA: Diagnosis not present

## 2016-03-02 NOTE — Progress Notes (Signed)
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 her to avoid junk food and sodas and cakes and cookies. These referrals are unconditional. She must see these specialists or else I can no longer be her primary care physician. I truly believe her syncope was related to a combination of dehydration, protein calorie malnutrition, hyperglycemia secondary to protein calorie malnutrition, hypokalemia, marijuana use, and opiate use.  02/25/16 Patient discontinued her fluid pill as I directed her to do. She's gained 6 pounds since I last saw her. She has +1 pitting edema in her legs distal to the knee. She denies chest pain shortness of breath or dyspnea on exertion. The orthopnea or paroxysmal nocturnal dyspnea.  At that time, my plan was: Resume Bumex at 2 mg by mouth daily. Also resume Klor-Con 20 mEq by mouth daily. Recheck next week. Patient increased diuresed approximately 6 pounds. She will likely need to stay on a low-dose Bumex to achieve this.  03/02/16 Since starting the diuretic, patient has lost 4 pounds. She still has +1 edema in her legs. She still needs to lose approximately 2-3 pounds. Yesterday evening, she fell asleep draped over her walker with pressure being applied to her medial bicep. She was asleep for several hours.  When she awoke she was unable to dorsiflex her right wrist or extend the MCP joints. She also has weakness in the interosseous muscles. There is no evidence of a median neuropathy or an ulnar neuropathy. She has fine 2. discrimination in the territories of the hand innervated by the median nerve and ulnar nerve however there is diminished sensation in the dorsum of the hand with innervation is from the radial nerve. She has normal strength with flexion of the elbow abduction of the shoulder and extension of the shoulder. 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 Acid reflux   . Anemia   . Anxiety   . Arthritis   . AVN of femur (HCC)    left hip  . Clotting disorder (HCC)    undetermined  . Cystitis   . 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 Hypertension   . Neuromuscular disorder (HCC)   . Osteoporosis   . Psoriasis   . Thyroid disease    Past Surgical History:  Procedure Laterality Date  . CHOLECYSTECTOMY    . ESOPHAGOGASTRODUODENOSCOPY  10/2014   at [facility], was normal. Dr. Merri Brunette  . GASTRIC BYPASS  05/2001  . SMALL INTESTINE SURGERY  2004   exploratory   Current Outpatient Prescriptions on File Prior to Visit  Medication Sig Dispense Refill  . ACIPHEX 20 MG tablet TAKE 1 TABLET BY MOUTH TWICE A DAY (Patient taking differently: TAKE 20mg  TABLET BY MOUTH TWICE A DAY) 60 tablet 5  . ALPRAZolam (XANAX) 0.5 MG tablet TAKE 1 TABLET BY MOUTH TWICE A DAY AS NEEDED (Patient taking differently: TAKE 0.5 TABLET BY MOUTH TWICE A DAY AS NEEDED for anxiety) 60 tablet 1  . bumetanide (BUMEX) 2 MG tablet Take 1 tablet (2 mg total) by mouth daily. 30 tablet 3  . feeding supplement, ENSURE ENLIVE, (ENSURE ENLIVE) LIQD Take 237 mLs by mouth 2 (two)  times daily between meals. 237 mL 12  . furosemide (LASIX) 40 MG tablet Take 1 tablet (40 mg total) by mouth daily as needed. 30 tablet 3  . HYDROcodone-acetaminophen (NORCO) 10-325 MG tablet Take 1 tablet by  mouth 3 (three) times daily as needed. Must last 30 days (Patient taking differently: Take 1 tablet by mouth 3 (three) times daily as needed for moderate pain. Must last 30 days) 90 tablet 0  . levothyroxine (SYNTHROID, LEVOTHROID) 125 MCG tablet TAKE 1 TABLET EVERY DAY (Patient taking differently: TAKE 125mg  TABLET by mouth  EVERY other DAY) 90 tablet 0  . levothyroxine (SYNTHROID, LEVOTHROID) 137 MCG tablet TAKE 1 TABLET BY MOUTH DAILY BEFORE BREAKFAST. 30 tablet 11  . LYRICA 225 MG capsule TAKE ONE CAPSULE BY MOUTH TWICE A DAY (Patient taking differently: TAKE 225mg  CAPSULE BY MOUTH TWICE A DAY) 60 capsule 2  . metolazone (ZAROXOLYN) 5 MG tablet Take 1 tablet (5 mg total) by mouth daily. Take zaroxylyn 5 mg 30 minutes before lasix 1-2 times a week. 30 tablet 1  . metoprolol succinate (TOPROL-XL) 50 MG 24 hr tablet Take 1 tablet (50 mg total) by mouth 2 (two) times daily. 180 tablet 3  . mometasone (ELOCON) 0.1 % cream APPLY TO AFFECTED AREA EVERY DAY (Patient taking differently: APPLY TO AFFECTED AREA daily prn for scorisis) 45 g 0  . nortriptyline (PAMELOR) 75 MG capsule TAKE 1 CAPSULE (75 MG TOTAL) BY MOUTH AT BEDTIME. 30 capsule 1  . ondansetron (ZOFRAN) 4 MG tablet TAKE 1 TABLET (4 MG TOTAL) BY MOUTH EVERY 8 (EIGHT) HOURS AS NEEDED FOR NAUSEA OR VOMITING. 20 tablet 0  . potassium chloride SA (K-DUR,KLOR-CON) 20 MEQ tablet Take 1 tablet (20 mEq total) by mouth 2 (two) times daily. (Patient taking differently: Take 20 mEq by mouth 2 (two) times daily as needed (when taking lasix). ) 60 tablet 3  . simethicone (MYLICON) 80 MG chewable tablet Chew 80 mg by mouth every 6 (six) hours as needed for flatulence.    . sucralfate (CARAFATE) 1 g tablet Take 1 tablet (1 g total) by mouth 4 (four) times daily. (Patient taking differently: Take 1 g by mouth 4 (four) times daily as needed (stomach coating). ) 120 tablet 0  . topiramate (TOPAMAX) 50 MG tablet TAKE 1 TABLET BY MOUTH AT BEDTIME (Patient taking  differently: TAKE 50mg  TABLET BY MOUTH AT BEDTIME) 30 tablet 3   No current facility-administered medications on file prior to visit.    Allergies  Allergen Reactions  . Ibuprofen Nausea Only    Bad stomach pains  . Toradol [Ketorolac Tromethamine] Anxiety  . Aspirin Hives  . Cleocin [Clindamycin Hcl] Itching and Rash   Social History   Social History  . Marital status: Divorced    Spouse name: N/A  . Number of children: N/A  . Years of education: N/A   Occupational History  . Not on file.   Social History Main Topics  . Smoking status: Current Every Day Smoker    Packs/day: 0.50    Types: Cigarettes  . Smokeless tobacco: Never Used  . Alcohol use 6.0 oz/week    10 Shots of liquor per week  . Drug use: No  . Sexual activity: Yes    Birth control/ protection: Pill   Other Topics Concern  . Not on file   Social History Narrative  . No narrative on file      Review of Systems  All other systems reviewed and are negative.  Objective:   Physical Exam  Constitutional: She appears well-developed and well-nourished. No distress.  Neck: No JVD present.  Cardiovascular: Normal rate, regular rhythm and normal heart sounds.   No murmur heard. Pulmonary/Chest: Effort normal and breath sounds normal.  Abdominal: Soft. Bowel sounds are normal. She exhibits no distension. There is no tenderness. There is no rebound and no guarding.  Musculoskeletal: She exhibits edema.       Left hip: She exhibits decreased range of motion, decreased strength and tenderness. She exhibits no swelling, no crepitus and no deformity.  Skin: She is not diaphoretic. No erythema.    See hpi      Assessment & Plan:   Radial nerve palsy, right - Plan: BASIC METABOLIC PANEL WITH GFR  I believe the patient compressed the radial nerve all sleeping and now has a radial nerve palsy as evidenced by her inability to dorsiflex the right wrist were extend the right MCP joints. There is no  evidence of a palsy in the median or ulnar nerve. There is also no evidence of neurologic deficits in any of her cranial nerves. There is no unilateral weakness anywhere else on the body. She denies any slurred speech or facial numbness. Therefore I do not believe this is a stroke. Recommended tincture of time. I will also schedule nerve conduction studies to evaluate further if persistent

## 2016-03-03 LAB — BASIC METABOLIC PANEL WITH GFR
BUN: 23 mg/dL (ref 7–25)
CALCIUM: 8.6 mg/dL (ref 8.6–10.2)
CO2: 25 mmol/L (ref 20–31)
CREATININE: 0.72 mg/dL (ref 0.50–1.10)
Chloride: 107 mmol/L (ref 98–110)
GFR, Est Non African American: >89 mL/min (ref >=60)
GLUCOSE: 85 mg/dL (ref 70–99)
Potassium: 3.9 mmol/L (ref 3.5–5.3)
SODIUM: 142 mmol/L (ref 135–146)

## 2016-03-08 ENCOUNTER — Telehealth: Payer: Self-pay | Admitting: Family Medicine

## 2016-03-08 DIAGNOSIS — G8929 Other chronic pain: Secondary | ICD-10-CM

## 2016-03-08 DIAGNOSIS — R109 Unspecified abdominal pain: Principal | ICD-10-CM

## 2016-03-08 MED ORDER — HYDROCODONE-ACETAMINOPHEN 10-325 MG PO TABS: 1.0000 | ORAL_TABLET | Freq: Three times a day (TID) | ORAL | 0 refills | Status: DC | PRN

## 2016-03-08 NOTE — Telephone Encounter (Signed)
Ok, referral was ordered last week I think.

## 2016-03-08 NOTE — Telephone Encounter (Signed)
RX printed, left up front and patient aware to pick up and referral was placed in 03/02/16

## 2016-03-08 NOTE — Telephone Encounter (Signed)
Ok to refill 

## 2016-03-08 NOTE — Telephone Encounter (Signed)
Patient is calling to get rx for her vicotin if possible this morning, so her mom can pick it up, [place] is [place], also needs referral for nerve conduction study he hand is still numb  [phone]

## 2016-03-10 ENCOUNTER — Telehealth: Payer: Self-pay | Admitting: Family Medicine

## 2016-03-10 ENCOUNTER — Other Ambulatory Visit: Payer: Self-pay

## 2016-03-10 NOTE — Telephone Encounter (Signed)
Dr. Tanya NonesPickard give this to Dorothy BattenKim to complete - not sure when exactly - [name]

## 2016-03-10 NOTE — Telephone Encounter (Signed)
She is calling asking if Dr. Tanya NonesPickard filled out [facility] for the tooth abstraction and was it faxed directly to them. After you completed the application also send latest notes along with justification of why the tooth needs to be pulled prior to hand surgery. She is going to fax over information. She states she has left several msg and no return calls.

## 2016-03-14 NOTE — Telephone Encounter (Signed)
I rec'd form from [facility] and it was completed and faxed 02/22/16.  Has patient tried to contact them.  As they are probably staffed by dental students, they were probably closed over the holidays.

## 2016-03-14 NOTE — Telephone Encounter (Signed)
Call[facility] and [facility] and informed [facility] and that pt may want to call and f/u with them personally. She will call pt and inform her to do so.

## 2016-03-16 ENCOUNTER — Encounter: Payer: Medicare Other | Admitting: Neurology

## 2016-03-17 ENCOUNTER — Encounter: Payer: Self-pay | Admitting: Neurology

## 2016-03-27 ENCOUNTER — Other Ambulatory Visit: Payer: Self-pay | Admitting: Family Medicine

## 2016-03-28 ENCOUNTER — Telehealth: Payer: Self-pay | Admitting: Family Medicine

## 2016-03-28 DIAGNOSIS — R600 Localized edema: Secondary | ICD-10-CM

## 2016-03-28 NOTE — Telephone Encounter (Signed)
I am assuming she wants to switch back from bumex to lasix.  I am ok with that (lasix 40 poqday).

## 2016-03-28 NOTE — Telephone Encounter (Signed)
Patient is calling to speak to you regarding a pill making her gain 10 pounds, and switching back to laxis, very hard to understand  [phone]

## 2016-03-31 MED ORDER — FUROSEMIDE 40 MG PO TABS: 40.0000 mg | ORAL_TABLET | Freq: Every day | ORAL | 3 refills | Status: DC | PRN

## 2016-03-31 NOTE — Telephone Encounter (Signed)
Pt aware of recommendations via vm and med sent to pharm 

## 2016-04-05 ENCOUNTER — Telehealth: Payer: Self-pay | Admitting: Family Medicine

## 2016-04-05 NOTE — Telephone Encounter (Signed)
Pt needs a refill on lyrica, but wants to know if we have samples. Please call back. [phone].

## 2016-04-06 ENCOUNTER — Telehealth: Payer: Self-pay | Admitting: Family Medicine

## 2016-04-06 DIAGNOSIS — R109 Unspecified abdominal pain: Principal | ICD-10-CM

## 2016-04-06 DIAGNOSIS — G8929 Other chronic pain: Secondary | ICD-10-CM

## 2016-04-06 MED ORDER — PREGABALIN 225 MG PO CAPS: ORAL_CAPSULE | ORAL | 3 refills | Status: DC

## 2016-04-06 MED ORDER — HYDROCODONE-ACETAMINOPHEN 10-325 MG PO TABS: 1.0000 | ORAL_TABLET | Freq: Three times a day (TID) | ORAL | 0 refills | Status: DC | PRN

## 2016-04-06 NOTE — Telephone Encounter (Signed)
Going 04/20/16 for tooth extraction - had to cxd appt last week d/t snow  RX printed, left up front and patient aware to pick up

## 2016-04-06 NOTE — Telephone Encounter (Signed)
Ok to refill 

## 2016-04-06 NOTE — Telephone Encounter (Signed)
Where do we stand with the dentist and the orthopedist?  Ok with refill.

## 2016-04-06 NOTE — Telephone Encounter (Signed)
Medication called/sent to requested pharmacy and pt aware we do not carry samples in office via vm

## 2016-04-06 NOTE — Telephone Encounter (Signed)
Pt needs refill on Vicodin please call her back [phone].

## 2016-04-12 ENCOUNTER — Other Ambulatory Visit: Payer: Self-pay | Admitting: Family Medicine

## 2016-04-12 NOTE — Telephone Encounter (Signed)
ok 

## 2016-04-12 NOTE — Telephone Encounter (Signed)
Ok to refill 

## 2016-04-27 DIAGNOSIS — G894 Chronic pain syndrome: Secondary | ICD-10-CM | POA: Diagnosis not present

## 2016-04-27 DIAGNOSIS — M25559 Pain in unspecified hip: Secondary | ICD-10-CM | POA: Diagnosis not present

## 2016-04-27 DIAGNOSIS — Z79899 Other long term (current) drug therapy: Secondary | ICD-10-CM | POA: Diagnosis not present

## 2016-04-27 DIAGNOSIS — R109 Unspecified abdominal pain: Secondary | ICD-10-CM | POA: Diagnosis not present

## 2016-04-27 DIAGNOSIS — Z79891 Long term (current) use of opiate analgesic: Secondary | ICD-10-CM | POA: Diagnosis not present

## 2016-05-25 ENCOUNTER — Ambulatory Visit (INDEPENDENT_AMBULATORY_CARE_PROVIDER_SITE_OTHER): Payer: Medicare Other | Admitting: Physician Assistant

## 2016-05-25 ENCOUNTER — Encounter: Payer: Self-pay | Admitting: Physician Assistant

## 2016-05-25 ENCOUNTER — Other Ambulatory Visit: Payer: Self-pay | Admitting: Physician Assistant

## 2016-05-25 ENCOUNTER — Ambulatory Visit
Admission: RE | Admit: 2016-05-25 | Discharge: 2016-05-25 | Disposition: A | Discharge status: Home / Self Care | Payer: Medicare HMO | Source: Ambulatory Visit | Attending: Physician Assistant | Admitting: Physician Assistant

## 2016-05-25 ENCOUNTER — Other Ambulatory Visit: Payer: Self-pay | Admitting: Family Medicine

## 2016-05-25 VITALS — BP 100/64 | HR 80 | Temp 98.0°F | Resp 16

## 2016-05-25 DIAGNOSIS — M25562 Pain in left knee: Secondary | ICD-10-CM

## 2016-05-25 DIAGNOSIS — G5631 Lesion of radial nerve, right upper limb: Secondary | ICD-10-CM

## 2016-05-25 DIAGNOSIS — M79652 Pain in left thigh: Secondary | ICD-10-CM

## 2016-05-25 DIAGNOSIS — R600 Localized edema: Secondary | ICD-10-CM

## 2016-05-25 DIAGNOSIS — R109 Unspecified abdominal pain: Secondary | ICD-10-CM

## 2016-05-25 DIAGNOSIS — G8929 Other chronic pain: Secondary | ICD-10-CM

## 2016-05-25 DIAGNOSIS — M25552 Pain in left hip: Secondary | ICD-10-CM | POA: Diagnosis not present

## 2016-05-25 MED ORDER — HYDROCODONE-ACETAMINOPHEN 10-325 MG PO TABS: 1.0000 | ORAL_TABLET | Freq: Three times a day (TID) | ORAL | 0 refills | Status: DC | PRN

## 2016-05-25 NOTE — Progress Notes (Signed)
Patient ID: Jairy Angulo MRN: [id number], DOB: 03-30-1975, 41 y.o. Date of Encounter: @DATE @  Chief Complaint:  Chief Complaint  Patient presents with  . left hip down to leg    pain,swelling in both legs    HPI: 41 y.o. year old female  presents with above.   I reviewed Dr. Caren Macadam last office note which outlined prior notes. Today she has several concerns that she wanted to address.  Lower extremity edema: She has been checking her weights daily at home and got some digital scales. Says that in the past, when all of the fluid was off, her weight was 174 and her goal weight is 174. Recently weight had gotten up to 185 and is now at 181. Is concerned that she is holding extra fluid that needs to be gotten off. She also says she feels mouth is dry. In conversation discussed that if protein in blood is low then fluid will go from inside the vessels to the extravascular space. Asked how her diet has been and she said that she has to admit that her diet has been poor recently.   reviewed that on 03/28/16 there was a phone message and at that time she was changed to Lasix 40 mg daily. Verified with her that this is the current diuretic and current dose that she is on.  Also says that someone had mentioned to her that it could be because of her heart and I reviewed with her that she had echo 02/11/16 showed normal LV function.  She reports that 2 nights ago she fell and is having new increased amount of pain in her left knee and left thigh compared to usual.  also shows me her right hand which is contracted and withdrawn. Reviewed that this was discussed at her last visit and I had reviewed that information and then I also reviewed there actually was a nerve conduction study ordered in the computer. She then says that at the time of the visit they felt it may have just been secondary to the way she was leaning on her walker and that it would improve if that was the case and if that occurred  then she could cancel the nerve conduction study. Says at that time she thought it was getting better so she canceled the nerve conduction study but now is realizing that it is not better. Says this is contributing to injuries when she has falls because she cannot use the right hand to brace herself.  She feels that she needs x-rays to evaluate the left knee and left thigh because this pain is new and worse compared to her usual pain. She is concerned that she has caused a new injury secondary to the fall 2 days ago. She says that she is in severe pain and is out of pain medicine. Reviewed that the last pain medicine prescribed here was on 04/06/16 for hydrocodone 10 mg #90.    Says that she did have oral surgery performed one month ago and has appointment to see orthopedics March 22.       Past Medical History:  Diagnosis Date  . Acid reflux   . Anemia   . Anxiety   . Arthritis   . AVN of femur (HCC)    left hip  . Clotting disorder (HCC)    undetermined  . Cystitis   . 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 Hypertension   . Neuromuscular disorder (HCC)   . Osteoporosis   .  Psoriasis   . Thyroid disease      Home Meds: Outpatient Medications Prior to Visit  Medication Sig Dispense Refill  . ACIPHEX 20 MG tablet TAKE 1 TABLET BY MOUTH TWICE A DAY (Patient taking differently: TAKE 20mg  TABLET BY MOUTH TWICE A DAY) 60 tablet 5  . ALPRAZolam (XANAX) 0.5 MG tablet TAKE 1 TABLET BY MOUTH TWICE A DAY AS NEEDED 60 tablet 1  . feeding supplement, ENSURE ENLIVE, (ENSURE ENLIVE) LIQD Take 237 mLs by mouth 2 (two) times daily between meals. 237 mL 12  . furosemide (LASIX) 40 MG tablet Take 1 tablet (40 mg total) by mouth daily as needed. 30 tablet 3  . levothyroxine (SYNTHROID, LEVOTHROID) 125 MCG tablet TAKE 1 TABLET EVERY DAY (Patient taking differently: TAKE 125mg  TABLET by mouth  EVERY other DAY) 90 tablet 0  . levothyroxine (SYNTHROID, LEVOTHROID) 137 MCG tablet TAKE 1  TABLET BY MOUTH DAILY BEFORE BREAKFAST. 30 tablet 11  . metolazone (ZAROXOLYN) 5 MG tablet Take 1 tablet (5 mg total) by mouth daily. Take zaroxylyn 5 mg 30 minutes before lasix 1-2 times a week. 30 tablet 1  . metoprolol succinate (TOPROL-XL) 50 MG 24 hr tablet Take 1 tablet (50 mg total) by mouth 2 (two) times daily. 180 tablet 3  . mometasone (ELOCON) 0.1 % cream APPLY TO AFFECTED AREA EVERY DAY (Patient taking differently: APPLY TO AFFECTED AREA daily prn for scorisis) 45 g 0  . nortriptyline (PAMELOR) 75 MG capsule TAKE ONE CAPSULE BY MOUTH EVERY EVENING AT BEDTIME 30 capsule 5  . ondansetron (ZOFRAN) 4 MG tablet TAKE 1 TABLET (4 MG TOTAL) BY MOUTH EVERY 8 (EIGHT) HOURS AS NEEDED FOR NAUSEA OR VOMITING. 20 tablet 0  . potassium chloride SA (K-DUR,KLOR-CON) 20 MEQ tablet Take 1 tablet (20 mEq total) by mouth 2 (two) times daily. (Patient taking differently: Take 20 mEq by mouth 2 (two) times daily as needed (when taking lasix). ) 60 tablet 3  . pregabalin (LYRICA) 225 MG capsule TAKE 225mg  CAPSULE BY MOUTH TWICE A DAY 60 capsule 3  . simethicone (MYLICON) 80 MG chewable tablet Chew 80 mg by mouth every 6 (six) hours as needed for flatulence.    . sucralfate (CARAFATE) 1 g tablet Take 1 tablet (1 g total) by mouth 4 (four) times daily. (Patient taking differently: Take 1 g by mouth 4 (four) times daily as needed (stomach coating). ) 120 tablet 0  . topiramate (TOPAMAX) 50 MG tablet TAKE 1 TABLET BY MOUTH AT BEDTIME (Patient taking differently: TAKE 50mg  TABLET BY MOUTH AT BEDTIME) 30 tablet 3  . HYDROcodone-acetaminophen (NORCO) 10-325 MG tablet Take 1 tablet by mouth 3 (three) times daily as needed for moderate pain. Must last 30 days 90 tablet 0   No facility-administered medications prior to visit.     Allergies:  Allergies  Allergen Reactions  . Ibuprofen Nausea Only    Bad stomach pains  . Toradol [Ketorolac Tromethamine] Anxiety  . Aspirin Hives  . Cleocin [Clindamycin Hcl] Itching  and Rash    Social History   Social History  . Marital status: Divorced    Spouse name: N/A  . Number of children: N/A  . Years of education: N/A   Occupational History  . Not on file.   Social History Main Topics  . Smoking status: Current Every Day Smoker    Packs/day: 0.50    Types: Cigarettes  . Smokeless tobacco: Never Used  . Alcohol use 6.0 oz/week    10 Shots  of liquor per week  . Drug use: No  . Sexual activity: Yes    Birth control/ protection: Pill   Other Topics Concern  . Not on file   Social History Narrative  . No narrative on file    Family History  Problem Relation Age of Onset  . Depression Mother   . Diabetes Mother   . Hyperlipidemia Mother   . Depression Father   . Hearing loss Father   . Hyperlipidemia Father   . Hypertension Father   . Learning disabilities Father   . Mental illness Father   . Asthma Brother   . Alcohol abuse Maternal Grandmother   . Arthritis Maternal Grandmother   . Cancer Maternal Grandmother   . Cancer Maternal Grandfather   . Mental illness Paternal Grandmother   . Heart disease Paternal Grandfather   . Stroke Paternal Grandfather      Review of Systems:  See HPI for pertinent ROS. All other ROS negative.    Physical Exam: Blood pressure 100/64, pulse 80, temperature 98 F (36.7 C), temperature source Oral, resp. rate 16, SpO2 96 %., There is no height or weight on file to calculate BMI. General: Obese WF. Using walker with a seat on it to get in and out of the office today. Appears in no acute distress. Neck: Supple. No thyromegaly. No lymphadenopathy. Lungs: Clear bilaterally to auscultation without wheezes, rales, or rhonchi. Breathing is unlabored. Heart: RRR with S1 S2. No murmurs, rubs, or gallops. Musculoskeletal:Right hand drawn up, contracture Extremities/Skin:  1+ pitting edema to lower calves Neuro: Alert and oriented X 3. Moves all extremities spontaneously. Gait is normal. CNII-XII grossly in  tact. Psych:  Responds to questions appropriately with a normal affect.     ASSESSMENT AND PLAN:  41 y.o. year old female with  1. Lower extremity edema  Discussed need for increased protein in diet with her again today. Cont Lasix 40 mg every morning. Can take an extra half Lasix at noon for 3 days and then go back to the 40 mg every morning only.  2. Radial nerve palsy, right - Nerve conduction test; Future  3. Left thigh pain - DG Arthro Hip Left; Future  4. Acute pain of left knee - DG Knee Complete 4 Views Left; Future  5. Chronic abdominal pain - HYDROcodone-acetaminophen (NORCO) 10-325 MG tablet; Take 1 tablet by mouth 3 (three) times daily as needed for moderate pain. Must last 30 days  Dispense: 90 tablet; Refill: 0   Signed, [address] [place], [place], [facility] 05/25/2016 1:29 PM

## 2016-05-29 ENCOUNTER — Other Ambulatory Visit: Payer: Self-pay | Admitting: Family Medicine

## 2016-05-29 DIAGNOSIS — G5631 Lesion of radial nerve, right upper limb: Secondary | ICD-10-CM

## 2016-06-01 ENCOUNTER — Encounter (HOSPITAL_COMMUNITY): Payer: Self-pay

## 2016-06-01 ENCOUNTER — Observation Stay (HOSPITAL_COMMUNITY): Payer: Medicare HMO

## 2016-06-01 ENCOUNTER — Emergency Department (HOSPITAL_COMMUNITY): Payer: Medicare HMO

## 2016-06-01 ENCOUNTER — Observation Stay (HOSPITAL_COMMUNITY)
Admission: EM | Admit: 2016-06-01 | Discharge: 2016-06-03 | Disposition: A | Discharge status: Home Health | Payer: Medicare HMO | Attending: Internal Medicine | Admitting: Internal Medicine

## 2016-06-01 DIAGNOSIS — E876 Hypokalemia: Secondary | ICD-10-CM

## 2016-06-01 DIAGNOSIS — E43 Unspecified severe protein-calorie malnutrition: Secondary | ICD-10-CM | POA: Insufficient documentation

## 2016-06-01 DIAGNOSIS — R8271 Bacteriuria: Secondary | ICD-10-CM | POA: Insufficient documentation

## 2016-06-01 DIAGNOSIS — Z6834 Body mass index (BMI) 34.0-34.9, adult: Secondary | ICD-10-CM | POA: Insufficient documentation

## 2016-06-01 DIAGNOSIS — E039 Hypothyroidism, unspecified: Secondary | ICD-10-CM | POA: Insufficient documentation

## 2016-06-01 DIAGNOSIS — E860 Dehydration: Secondary | ICD-10-CM

## 2016-06-01 DIAGNOSIS — M25552 Pain in left hip: Secondary | ICD-10-CM

## 2016-06-01 DIAGNOSIS — G9340 Encephalopathy, unspecified: Secondary | ICD-10-CM | POA: Diagnosis not present

## 2016-06-01 DIAGNOSIS — I4581 Long QT syndrome: Secondary | ICD-10-CM | POA: Insufficient documentation

## 2016-06-01 DIAGNOSIS — I952 Hypotension due to drugs: Secondary | ICD-10-CM

## 2016-06-01 DIAGNOSIS — I10 Essential (primary) hypertension: Secondary | ICD-10-CM | POA: Diagnosis not present

## 2016-06-01 DIAGNOSIS — R600 Localized edema: Secondary | ICD-10-CM | POA: Diagnosis not present

## 2016-06-01 DIAGNOSIS — G8929 Other chronic pain: Secondary | ICD-10-CM | POA: Diagnosis not present

## 2016-06-01 DIAGNOSIS — M810 Age-related osteoporosis without current pathological fracture: Secondary | ICD-10-CM | POA: Diagnosis not present

## 2016-06-01 DIAGNOSIS — Z9889 Other specified postprocedural states: Secondary | ICD-10-CM

## 2016-06-01 DIAGNOSIS — M7989 Other specified soft tissue disorders: Secondary | ICD-10-CM

## 2016-06-01 DIAGNOSIS — M25569 Pain in unspecified knee: Secondary | ICD-10-CM

## 2016-06-01 DIAGNOSIS — T40601A Poisoning by unspecified narcotics, accidental (unintentional), initial encounter: Secondary | ICD-10-CM | POA: Diagnosis not present

## 2016-06-01 DIAGNOSIS — F1210 Cannabis abuse, uncomplicated: Secondary | ICD-10-CM | POA: Diagnosis not present

## 2016-06-01 DIAGNOSIS — F419 Anxiety disorder, unspecified: Secondary | ICD-10-CM | POA: Diagnosis not present

## 2016-06-01 DIAGNOSIS — M25551 Pain in right hip: Secondary | ICD-10-CM

## 2016-06-01 DIAGNOSIS — D509 Iron deficiency anemia, unspecified: Secondary | ICD-10-CM | POA: Diagnosis not present

## 2016-06-01 DIAGNOSIS — E162 Hypoglycemia, unspecified: Secondary | ICD-10-CM | POA: Diagnosis not present

## 2016-06-01 DIAGNOSIS — R109 Unspecified abdominal pain: Secondary | ICD-10-CM | POA: Diagnosis not present

## 2016-06-01 DIAGNOSIS — G92 Toxic encephalopathy: Secondary | ICD-10-CM | POA: Diagnosis not present

## 2016-06-01 DIAGNOSIS — S0990XA Unspecified injury of head, initial encounter: Secondary | ICD-10-CM | POA: Diagnosis not present

## 2016-06-01 DIAGNOSIS — G5631 Lesion of radial nerve, right upper limb: Secondary | ICD-10-CM | POA: Diagnosis not present

## 2016-06-01 DIAGNOSIS — Z9884 Bariatric surgery status: Secondary | ICD-10-CM

## 2016-06-01 DIAGNOSIS — R69 Illness, unspecified: Secondary | ICD-10-CM | POA: Diagnosis not present

## 2016-06-01 DIAGNOSIS — Z888 Allergy status to other drugs, medicaments and biological substances status: Secondary | ICD-10-CM | POA: Insufficient documentation

## 2016-06-01 DIAGNOSIS — W19XXXA Unspecified fall, initial encounter: Secondary | ICD-10-CM

## 2016-06-01 DIAGNOSIS — R404 Transient alteration of awareness: Secondary | ICD-10-CM | POA: Diagnosis not present

## 2016-06-01 DIAGNOSIS — R400 Somnolence: Secondary | ICD-10-CM

## 2016-06-01 DIAGNOSIS — Z79899 Other long term (current) drug therapy: Secondary | ICD-10-CM | POA: Insufficient documentation

## 2016-06-01 DIAGNOSIS — F17210 Nicotine dependence, cigarettes, uncomplicated: Secondary | ICD-10-CM | POA: Diagnosis not present

## 2016-06-01 DIAGNOSIS — M25562 Pain in left knee: Secondary | ICD-10-CM | POA: Diagnosis not present

## 2016-06-01 DIAGNOSIS — L03119 Cellulitis of unspecified part of limb: Secondary | ICD-10-CM | POA: Diagnosis present

## 2016-06-01 DIAGNOSIS — M87059 Idiopathic aseptic necrosis of unspecified femur: Secondary | ICD-10-CM | POA: Diagnosis not present

## 2016-06-01 DIAGNOSIS — Z881 Allergy status to other antibiotic agents status: Secondary | ICD-10-CM | POA: Insufficient documentation

## 2016-06-01 DIAGNOSIS — R531 Weakness: Secondary | ICD-10-CM | POA: Diagnosis not present

## 2016-06-01 DIAGNOSIS — I959 Hypotension, unspecified: Secondary | ICD-10-CM | POA: Diagnosis not present

## 2016-06-01 DIAGNOSIS — Z886 Allergy status to analgesic agent status: Secondary | ICD-10-CM | POA: Insufficient documentation

## 2016-06-01 LAB — FOLATE: FOLATE: 10.2 ng/mL (ref >5.9)

## 2016-06-01 LAB — FERRITIN: FERRITIN: 90 ng/mL (ref 11–307)

## 2016-06-01 LAB — COMPREHENSIVE METABOLIC PANEL
ALBUMIN: 1.9 g/dL — AB (ref 3.5–5.0)
ALT: 35 U/L (ref 14–54)
AST: 37 U/L (ref 15–41)
Alkaline Phosphatase: 150 U/L — ABNORMAL HIGH (ref 38–126)
Anion gap: 14 (ref 5–15)
BILIRUBIN TOTAL: 1.2 mg/dL (ref 0.3–1.2)
BUN: 21 mg/dL — AB (ref 6–20)
CHLORIDE: 100 mmol/L — AB (ref 101–111)
CO2: 25 mmol/L (ref 22–32)
CREATININE: 0.97 mg/dL (ref 0.44–1.00)
Calcium: 7.9 mg/dL — ABNORMAL LOW (ref 8.9–10.3)
GFR calc Af Amer: >60 mL/min (ref >60)
GLUCOSE: 56 mg/dL — AB (ref 65–99)
Potassium: 2.9 mmol/L — ABNORMAL LOW (ref 3.5–5.1)
Sodium: 139 mmol/L (ref 135–145)
TOTAL PROTEIN: 5.6 g/dL — AB (ref 6.5–8.1)

## 2016-06-01 LAB — RAPID URINE DRUG SCREEN, HOSP PERFORMED
AMPHETAMINES: NOT DETECTED
BARBITURATES: NOT DETECTED
Benzodiazepines: NOT DETECTED
COCAINE: NOT DETECTED
Opiates: POSITIVE — AB
TETRAHYDROCANNABINOL: POSITIVE — AB

## 2016-06-01 LAB — RETICULOCYTES
RBC.: 3.32 MIL/uL — ABNORMAL LOW (ref 3.87–5.11)
RETIC COUNT ABSOLUTE: 46.5 10*3/uL (ref 19.0–186.0)
Retic Ct Pct: 1.4 % (ref 0.4–3.1)

## 2016-06-01 LAB — URINALYSIS, ROUTINE W REFLEX MICROSCOPIC
Bilirubin Urine: NEGATIVE
GLUCOSE, UA: NEGATIVE mg/dL
Hgb urine dipstick: NEGATIVE
KETONES UR: 5 mg/dL — AB
Leukocytes, UA: NEGATIVE
NITRITE: POSITIVE — AB
PROTEIN: NEGATIVE mg/dL
Specific Gravity, Urine: 1.009 (ref 1.005–1.030)
pH: 6 (ref 5.0–8.0)

## 2016-06-01 LAB — CBC WITH DIFFERENTIAL/PLATELET
BASOS ABS: 0 10*3/uL (ref 0.0–0.1)
BASOS PCT: 1 %
Eosinophils Absolute: 0.1 10*3/uL (ref 0.0–0.7)
Eosinophils Relative: 3 %
HEMATOCRIT: 31.8 % — AB (ref 36.0–46.0)
Hemoglobin: 10.8 g/dL — ABNORMAL LOW (ref 12.0–15.0)
LYMPHS PCT: 49 %
Lymphs Abs: 1.9 10*3/uL (ref 0.7–4.0)
MCH: 32 pg (ref 26.0–34.0)
MCHC: 34 g/dL (ref 30.0–36.0)
MCV: 94.4 fL (ref 78.0–100.0)
Monocytes Absolute: 0.5 10*3/uL (ref 0.1–1.0)
Monocytes Relative: 14 %
NEUTROS ABS: 1.3 10*3/uL — AB (ref 1.7–7.7)
NEUTROS PCT: 33 %
Platelets: 158 10*3/uL (ref 150–400)
RBC: 3.37 MIL/uL — AB (ref 3.87–5.11)
RDW: 14.3 % (ref 11.5–15.5)
WBC: 3.8 10*3/uL — AB (ref 4.0–10.5)

## 2016-06-01 LAB — ETHANOL: Alcohol, Ethyl (B): <5 mg/dL (ref <5)

## 2016-06-01 LAB — MAGNESIUM: Magnesium: 1.6 mg/dL — ABNORMAL LOW (ref 1.7–2.4)

## 2016-06-01 LAB — POC URINE PREG, ED: PREG TEST UR: NEGATIVE

## 2016-06-01 LAB — TSH: TSH: 1.454 u[IU]/mL (ref 0.350–4.500)

## 2016-06-01 LAB — GLUCOSE, CAPILLARY
GLUCOSE-CAPILLARY: 81 mg/dL (ref 65–99)
GLUCOSE-CAPILLARY: 104 mg/dL — AB (ref 65–99)
GLUCOSE-CAPILLARY: 68 mg/dL (ref 65–99)
Glucose-Capillary: 104 mg/dL — ABNORMAL HIGH (ref 65–99)

## 2016-06-01 LAB — CORTISOL: CORTISOL PLASMA: 6.4 ug/dL

## 2016-06-01 LAB — ACETAMINOPHEN LEVEL: Acetaminophen (Tylenol), Serum: <10 ug/mL — ABNORMAL LOW (ref 10–30)

## 2016-06-01 LAB — SEDIMENTATION RATE: Sed Rate: 9 mm/hr (ref 0–22)

## 2016-06-01 LAB — CBG MONITORING, ED: Glucose-Capillary: 51 mg/dL — ABNORMAL LOW (ref 65–99)

## 2016-06-01 LAB — PROCALCITONIN: PROCALCITONIN: 3.72 ng/mL

## 2016-06-01 LAB — C-REACTIVE PROTEIN: CRP: 1.2 mg/dL — AB (ref <1.0)

## 2016-06-01 LAB — I-STAT CG4 LACTIC ACID, ED
LACTIC ACID, VENOUS: 0.65 mmol/L (ref 0.5–1.9)
LACTIC ACID, VENOUS: 0.49 mmol/L — AB (ref 0.5–1.9)

## 2016-06-01 LAB — CK: CK TOTAL: 350 U/L — AB (ref 38–234)

## 2016-06-01 LAB — VITAMIN B12: VITAMIN B 12: 446 pg/mL (ref 180–914)

## 2016-06-01 LAB — IRON AND TIBC
IRON: 77 ug/dL (ref 28–170)
Saturation Ratios: 42 % — ABNORMAL HIGH (ref 10.4–31.8)
TIBC: 183 ug/dL — AB (ref 250–450)
UIBC: 106 ug/dL

## 2016-06-01 LAB — SALICYLATE LEVEL: Salicylate Lvl: <7 mg/dL (ref 2.8–30.0)

## 2016-06-01 LAB — PHOSPHORUS: Phosphorus: 3 mg/dL (ref 2.5–4.6)

## 2016-06-01 LAB — PREALBUMIN: PREALBUMIN: 10.3 mg/dL — AB (ref 18–38)

## 2016-06-01 MED ORDER — POTASSIUM CHLORIDE CRYS ER 20 MEQ PO TBCR: 40.0000 meq | EXTENDED_RELEASE_TABLET | Freq: Once | ORAL | Status: DC

## 2016-06-01 MED ORDER — POTASSIUM CHLORIDE CRYS ER 20 MEQ PO TBCR
20.0000 meq | EXTENDED_RELEASE_TABLET | Freq: Two times a day (BID) | ORAL | Status: DC
  Administered 2016-06-01: 20 meq via ORAL
  Filled 2016-06-01: qty 1

## 2016-06-01 MED ORDER — MAGNESIUM SULFATE 2 GM/50ML IV SOLN: 2.0000 g | Freq: Once | INTRAVENOUS | Status: DC

## 2016-06-01 MED ORDER — NORTRIPTYLINE HCL 25 MG PO CAPS
75.0000 mg | ORAL_CAPSULE | Freq: Every day | ORAL | Status: DC
  Administered 2016-06-01 – 2016-06-02 (×2): 75 mg via ORAL
  Filled 2016-06-01 (×2): qty 3

## 2016-06-01 MED ORDER — DEXTROSE-NACL 5-0.9 % IV SOLN
INTRAVENOUS | Status: DC
  Administered 2016-06-01: 125 mL/h via INTRAVENOUS

## 2016-06-01 MED ORDER — ALPRAZOLAM 0.5 MG PO TABS: 0.5000 mg | ORAL_TABLET | Freq: Two times a day (BID) | ORAL | Status: DC | PRN

## 2016-06-01 MED ORDER — CEPHALEXIN 500 MG PO CAPS: 500.0000 mg | ORAL_CAPSULE | Freq: Four times a day (QID) | ORAL | Status: DC

## 2016-06-01 MED ORDER — TRAMADOL HCL 50 MG PO TABS
50.0000 mg | ORAL_TABLET | Freq: Four times a day (QID) | ORAL | Status: DC | PRN
  Administered 2016-06-01 – 2016-06-03 (×7): 50 mg via ORAL
  Filled 2016-06-01 (×7): qty 1

## 2016-06-01 MED ORDER — VANCOMYCIN HCL 10 G IV SOLR
1500.0000 mg | Freq: Once | INTRAVENOUS | Status: AC
  Administered 2016-06-01: 1500 mg via INTRAVENOUS
  Filled 2016-06-01: qty 1500

## 2016-06-01 MED ORDER — TOPIRAMATE 25 MG PO TABS: 50.0000 mg | ORAL_TABLET | Freq: Every day | ORAL | Status: DC

## 2016-06-01 MED ORDER — SODIUM CHLORIDE 0.9 % IV BOLUS (SEPSIS)
1000.0000 mL | Freq: Once | INTRAVENOUS | Status: AC
  Administered 2016-06-01: 1000 mL via INTRAVENOUS

## 2016-06-01 MED ORDER — POTASSIUM CHLORIDE 2 MEQ/ML IV SOLN
30.0000 meq | INTRAVENOUS | Status: DC
  Administered 2016-06-01: 30 meq via INTRAVENOUS
  Filled 2016-06-01: qty 15

## 2016-06-01 MED ORDER — DEXTROSE 50 % IV SOLN: 25.0000 mL | Freq: Once | INTRAVENOUS | Status: DC

## 2016-06-01 MED ORDER — TOPIRAMATE 25 MG PO TABS
50.0000 mg | ORAL_TABLET | Freq: Every day | ORAL | Status: DC
  Administered 2016-06-01 – 2016-06-02 (×2): 50 mg via ORAL
  Filled 2016-06-01 (×2): qty 2

## 2016-06-01 MED ORDER — NORTRIPTYLINE HCL 25 MG PO CAPS: 75.0000 mg | ORAL_CAPSULE | Freq: Every day | ORAL | Status: DC

## 2016-06-01 MED ORDER — ACETAMINOPHEN 650 MG RE SUPP: 650.0000 mg | Freq: Four times a day (QID) | RECTAL | Status: DC | PRN

## 2016-06-01 MED ORDER — ACETAMINOPHEN 325 MG PO TABS
650.0000 mg | ORAL_TABLET | Freq: Four times a day (QID) | ORAL | Status: DC | PRN
  Administered 2016-06-02 – 2016-06-03 (×2): 650 mg via ORAL
  Filled 2016-06-01 (×2): qty 2

## 2016-06-01 MED ORDER — PREGABALIN 75 MG PO CAPS: 225.0000 mg | ORAL_CAPSULE | Freq: Two times a day (BID) | ORAL | Status: DC

## 2016-06-01 MED ORDER — DEXTROSE 50 % IV SOLN
1.0000 | Freq: Once | INTRAVENOUS | Status: AC
  Administered 2016-06-01: 50 mL via INTRAVENOUS
  Filled 2016-06-01: qty 50

## 2016-06-01 MED ORDER — VANCOMYCIN HCL IN DEXTROSE 1-5 GM/200ML-% IV SOLN
1000.0000 mg | Freq: Two times a day (BID) | INTRAVENOUS | Status: DC
  Administered 2016-06-02: 1000 mg via INTRAVENOUS
  Filled 2016-06-01 (×2): qty 200

## 2016-06-01 MED ORDER — PREGABALIN 75 MG PO CAPS
225.0000 mg | ORAL_CAPSULE | Freq: Two times a day (BID) | ORAL | Status: DC
  Administered 2016-06-01 – 2016-06-03 (×4): 225 mg via ORAL
  Filled 2016-06-01 (×4): qty 3

## 2016-06-01 MED ORDER — PIPERACILLIN-TAZOBACTAM 3.375 G IVPB
3.3750 g | Freq: Three times a day (TID) | INTRAVENOUS | Status: DC
  Administered 2016-06-02: 3.375 g via INTRAVENOUS
  Filled 2016-06-01 (×3): qty 50

## 2016-06-01 MED ORDER — DEXTROSE 50 % IV SOLN: 50.0000 mL | Freq: Once | INTRAVENOUS | Status: DC

## 2016-06-01 MED ORDER — POTASSIUM CHLORIDE CRYS ER 20 MEQ PO TBCR
40.0000 meq | EXTENDED_RELEASE_TABLET | Freq: Once | ORAL | Status: DC
  Filled 2016-06-01: qty 2

## 2016-06-01 MED ORDER — PANTOPRAZOLE SODIUM 40 MG PO TBEC
40.0000 mg | DELAYED_RELEASE_TABLET | Freq: Every day | ORAL | Status: DC
  Administered 2016-06-01: 40 mg via ORAL
  Filled 2016-06-01: qty 1

## 2016-06-01 MED ORDER — PIPERACILLIN-TAZOBACTAM 3.375 G IVPB 30 MIN
3.3750 g | Freq: Once | INTRAVENOUS | Status: AC
  Administered 2016-06-01: 3.375 g via INTRAVENOUS
  Filled 2016-06-01: qty 50

## 2016-06-01 MED ORDER — ENOXAPARIN SODIUM 40 MG/0.4ML ~~LOC~~ SOLN
40.0000 mg | SUBCUTANEOUS | Status: DC
  Administered 2016-06-01 – 2016-06-02 (×2): 40 mg via SUBCUTANEOUS
  Filled 2016-06-01 (×2): qty 0.4

## 2016-06-01 MED ORDER — DEXTROSE 50 % IV SOLN
INTRAVENOUS | Status: AC
  Administered 2016-06-01: 50 mL
  Filled 2016-06-01: qty 50

## 2016-06-01 MED ORDER — SODIUM CHLORIDE 0.9 % IV SOLN: INTRAVENOUS | Status: DC

## 2016-06-01 MED ORDER — SUCRALFATE 1 G PO TABS
1.0000 g | ORAL_TABLET | Freq: Four times a day (QID) | ORAL | Status: DC
  Administered 2016-06-01 – 2016-06-03 (×8): 1 g via ORAL
  Filled 2016-06-01 (×8): qty 1

## 2016-06-01 MED ORDER — GADOBENATE DIMEGLUMINE 529 MG/ML IV SOLN
20.0000 mL | Freq: Once | INTRAVENOUS | Status: AC | PRN
  Administered 2016-06-01: 20 mL via INTRAVENOUS

## 2016-06-01 MED ORDER — LEVOTHYROXINE SODIUM 25 MCG PO TABS
137.0000 ug | ORAL_TABLET | Freq: Every day | ORAL | Status: DC
  Administered 2016-06-02 – 2016-06-03 (×2): 137 ug via ORAL
  Filled 2016-06-01 (×2): qty 1

## 2016-06-01 NOTE — ED Notes (Signed)
Pt returns from xray

## 2016-06-01 NOTE — Progress Notes (Signed)
All home medications counted, documented and taken to pharmacy. White copy of pharmacy forms placed into patient's chart. Patient aware. Lawson RadarHeather M Aryam Zhan

## 2016-06-01 NOTE — ED Triage Notes (Signed)
Pt arrives EMS with c/o weakness over 2 weeks. Taking meds for hip pain. States she has fallen in last 2 weeks and sleeps on floor because she is too weak to get up. Lives with Autistic son who is in second grade. EMS states pain med bottle filled hydrocodone  Filled march 15 for 90 pills with 6 left. Pt states she knows she is taking too much.

## 2016-06-01 NOTE — ED Notes (Signed)
Pt rerturns from MRI, sleeping but rouses to voice and touch.

## 2016-06-01 NOTE — H&P (Signed)
History and Physical    Dorothy Dyer MBB:[id number] DOB: 04/17/1975 DOA: 06/01/2016   PCP: Odette Fraction, MD   Patient coming from/Resides with: Private residence/lives with autistic son who is in the Second Grade  Admission status: Observation/medical floor -it may be medically necessary to stay a minimum 2 midnights to rule out impending and/or unexpected changes in physiologic status that may differ from initial evaluation performed in the ER and/or at time of admission therefore consider reevaluation of admission status and 24 hours.   Chief Complaint: Altered mental status  HPI: Dorothy Dyer is a 41 y.o. 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 This patient was last evaluated by her primary care physician on 3/15. During that visit Lasix dosage was discussed with the addition of a  1x prn schedule on top of her daily dose. It was felt a good deal of her edema was secondary to low protein in setting of gastric bypass procedure. Plans were to obtain x-rays of her left hip and knee. She also apparently has issues with chronic abdominal pain post-gastric bypass procedure and was given a prescription of Vicodin 10/325 one tablet 3 times daily as needed for moderate pain with caveat that this must last 30 days she was dispensed 90 tablets with 0 refills. Today EMS was called to the home by a relative who is going to take the patient to her orthopedic physician appointment today. The patient apparently was difficult to awaken and had been experiencing several falls over the past 2 weeks. It was reported that the patient has been sleeping on the floor because  she is too weak to get up. Upon review of the patient's medications the hydrocodone bottle that was filled on March 15 for 90 pills had only 6 pills left. Patient admitted to EMS staff that she been taking too many pain medications.  ED Course: Vital Signs: BP 101/79   Pulse 94   Temp 97.5 F (36.4 C) (Oral)   Resp 20   Ht 5' 1.5" (1.562 m)   Wt 81.6 kg (180 lb)   SpO2 100%   BMI 33.46 kg/m  CT head without contrast: No acute abnormalities 2 view chest x-ray pending Lab data: Sodium 139, potassium 2.9, chloride 100, CO2 25, glucose 56, BUN 21, creatinine 0.97, calcium 7.9, alkaline phosphatase 150, albumin 1.9, LFTs not elevated, CK 350, lactic acid 0.65, white count 3800 with normal differential except for mildly decreased absolute neutrophils 1.3%, hemoglobin 10.9, platelets 158,000, Tylenol level less than 10, salicylate level less than 7, urine pregnancy negative, urinalysis positive for nitrite 5 ketones rare bacteria and 0-5 wbc's, urine drug screen positive for opiates and marijuana, blood cultures and urine culture obtained in the ER Medications and treatments: Normal saline bolus 2 L, and vancomycin 1500 mg IV 1, Zosyn 3.375 g IV 1, potassium 30 mEq IV bolus 1, 1 aunt D50  Review of Systems:  In addition to the HPI above,  No Fever-myalgias or other constitutional symptoms No Headache, changes with Vision or hearing, new weakness, tingling, numbness in any extremity, dysarthria or word finding difficulty, tremors or seizure activity No problems swallowing food or Liquids, indigestion/reflux, choking or coughing while eating No Chest pain, Cough or Shortness of Breath,  palpitations, orthopnea or DOE No N/V, melena,hematochezia, dark tarry stools, constipation No dysuria, malodorous urine, hematuria or flank pain No new joint pains, aches, swelling or redness No recent unintentional weight gain or loss No polyuria, polydypsia or polyphagia   Past Medical History:  Diagnosis  Date  . Acid reflux   . Anemia   . Anxiety   . Arthritis   . AVN of femur ([place])    left hip  . Clotting disorder ([place])    undetermined  . Cystitis   . 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 Hypertension   . Neuromuscular disorder ([place])   . Osteoporosis   . Psoriasis   . Thyroid disease     Past Surgical History:  Procedure Laterality Date  . CHOLECYSTECTOMY    . ESOPHAGOGASTRODUODENOSCOPY  10/2014   at [facility], was normal. Dr. Roney Mans  . GASTRIC BYPASS  05/2001  . SMALL INTESTINE SURGERY  2004   exploratory    Social History   Social History  . Marital status: Divorced    Spouse name: N/A  . Number of children: N/A  . Years of education: N/A   Occupational History  . Not on file.   Social History Main Topics  . Smoking status: Current Every Day Smoker    Packs/day: 0.50    Types: Cigarettes  . Smokeless tobacco: Never Used  . Alcohol use 6.0 oz/week    10 Shots of liquor per week  . Drug use: No  . Sexual activity: Yes    Birth control/ protection: Pill   Other Topics Concern  . Not on file   Social History Narrative  . No narrative on file    Mobility: Reports frequent falls at home Work history: Disabled since gastric bypass procedure   Allergies  Allergen Reactions  . Ibuprofen Nausea Only    Bad stomach pains  . Toradol [Ketorolac Tromethamine] Anxiety  . Aspirin Hives  . Cleocin [Clindamycin Hcl] Itching and Rash    Family History  Problem Relation Age of Onset  . Depression Mother   . Diabetes Mother   . Hyperlipidemia Mother   . Depression Father   . Hearing loss Father   . Hyperlipidemia Father   . Hypertension Father   . Learning disabilities Father   . Mental illness Father   . Asthma Brother   . Alcohol abuse Maternal Grandmother   . Arthritis Maternal Grandmother   . Cancer Maternal Grandmother   . Cancer Maternal Grandfather   . Mental illness Paternal Grandmother   . Heart disease Paternal Grandfather   . Stroke  Paternal Grandfather      Prior to Admission medications   Medication Sig Start Date End Date Taking? Authorizing Provider  ACIPHEX 20 MG tablet TAKE 1 TABLET BY MOUTH TWICE A DAY Patient taking differently: TAKE 9m TABLET BY MOUTH TWICE A DAY 01/19/16   WSusy Frizzle MD  ALPRAZolam (Duanne Moron 0.5 MG tablet TAKE 1 TABLET BY MOUTH TWICE A DAY AS NEEDED 04/13/16   WSusy Frizzle MD  feeding supplement, ENSURE ENLIVE, (ENSURE ENLIVE) LIQD Take 237 mLs by mouth 2 (two) times daily between meals. 02/12/16   Nishant Dhungel, MD  furosemide (LASIX) 40 MG tablet Take 1 tablet (40 mg total) by mouth daily as needed. 03/31/16   WSusy Frizzle MD  HYDROcodone-acetaminophen (NORCO) 10-325 MG tablet Take 1 tablet by mouth 3 (three) times daily as needed for moderate pain. Must last 30 days 05/25/16   MLonie Peak  Dixon, PA-C  levothyroxine (SYNTHROID, LEVOTHROID) 125 MCG tablet TAKE 1 TABLET EVERY DAY Patient taking differently: TAKE 147m TABLET by mouth  EVERY other DAY 11/25/15   WSusy Frizzle MD  levothyroxine (SYNTHROID, LEVOTHROID) 137 MCG tablet TAKE 1 TABLET BY MOUTH DAILY BEFORE BREAKFAST. 02/28/16   WSusy Frizzle MD  metolazone (ZAROXOLYN) 5 MG tablet Take 1 tablet (5 mg total) by mouth daily. Take zaroxylyn 5 mg 30 minutes before lasix 1-2 times a week. 01/10/16   WSusy Frizzle MD  metoprolol succinate (TOPROL-XL) 50 MG 24 hr tablet Take 1 tablet (50 mg total) by mouth 2 (two) times daily. 12/24/15   WSusy Frizzle MD  mometasone (ELOCON) 0.1 % cream APPLY TO AFFECTED AREA EVERY DAY Patient taking differently: APPLY TO AFFECTED AREA daily prn for scorisis 01/27/16   WSusy Frizzle MD  nortriptyline (PAMELOR) 75 MG capsule TAKE ONE CAPSULE BY MOUTH EVERY EVENING AT BEDTIME 03/27/16   WSusy Frizzle MD  ondansetron (ZOFRAN) 4 MG tablet TAKE 1 TABLET (4 MG TOTAL) BY MOUTH EVERY 8 (EIGHT) HOURS AS NEEDED FOR NAUSEA OR VOMITING. 12/28/14   WSusy Frizzle MD  potassium chloride SA  (K-DUR,KLOR-CON) 20 MEQ tablet Take 1 tablet (20 mEq total) by mouth 2 (two) times daily. Patient taking differently: Take 20 mEq by mouth 2 (two) times daily as needed (when taking lasix).  03/25/15   WSusy Frizzle MD  pregabalin (LYRICA) 225 MG capsule TAKE 2238mCAPSULE BY MOUTH TWICE A DAY 04/06/16   WaSusy FrizzleMD  simethicone (MYLICON) 80 MG chewable tablet Chew 80 mg by mouth every 6 (six) hours as needed for flatulence.    Historical Provider, MD  sucralfate (CARAFATE) 1 g tablet Take 1 tablet (1 g total) by mouth 4 (four) times daily. Patient taking differently: Take 1 g by mouth 4 (four) times daily as needed (stomach coating).  12/28/15   WaSusy FrizzleMD  topiramate (TOPAMAX) 50 MG tablet TAKE 1 TABLET BY MOUTH AT BEDTIME Patient taking differently: TAKE 5028mABLET BY MOUTH AT BEDTIME 01/31/16   WarSusy FrizzleD    Physical Exam: Vitals:   06/01/16 1245 06/01/16 1300 06/01/16 1330 06/01/16 1345  BP: 116/66 105/70 100/63 101/79  Pulse: 90 98 98 94  Resp:   13 20  Temp:      TempSrc:      SpO2: (!) 86% 100% 99% 100%  Weight:      Height:          Constitutional: NAD, Anxious, uncomfortable with complaints of hip knee and chronic abdominal pain Eyes: PERRL, lids and conjunctivae normal ENMT: Mucous membranes are dry. Posterior pharynx clear of any exudate or lesions.Normal dentition.  Neck: normal, supple, no masses, no thyromegaly Respiratory: clear to auscultation bilaterally, no wheezing, no crackles. Normal respiratory effort. No accessory muscle use.  Cardiovascular: Regular rate and rhythm, no murmurs / rubs / gallops. Marked bilateral lower extremity edema that appears consistent with lymphedema. 2+ pedal pulses. No carotid bruits.  Abdomen: no tenderness, no masses palpated. No hepatosplenomegaly. Bowel sounds positive.  Musculoskeletal: no clubbing / cyanosis. No joint deformity upper and lower extremities. Good ROM, no contractures. Normal muscle  tone.  Skin: no rashes, lesions, ulcers. No induration but lower extremity skin changes appear consistent with chronic edema and stasis dermatitis-there are also vesicular clusters on the anterior surfaces of the lower extremities just above the ankle area Neurologic: CN 2-12 grossly intact. Sensation intact, DTR normal.  Strength 5/5 x all 4 extremities.  Psychiatric: Normal judgment and insight. Alert and oriented x 3. Anxious mood.    Labs on Admission: I have personally reviewed following labs and imaging studies  CBC:  Recent Labs Lab 06/01/16 1049  WBC 3.8*  NEUTROABS 1.3*  HGB 10.8*  HCT 31.8*  MCV 94.4  PLT 546   Basic Metabolic Panel:  Recent Labs Lab 06/01/16 1049  NA 139  K 2.9*  CL 100*  CO2 25  GLUCOSE 56*  BUN 21*  CREATININE 0.97  CALCIUM 7.9*   GFR: Estimated Creatinine Clearance: 73.9 mL/min (by C-G formula based on SCr of 0.97 mg/dL). Liver Function Tests:  Recent Labs Lab 06/01/16 1049  AST 37  ALT 35  ALKPHOS 150*  BILITOT 1.2  PROT 5.6*  ALBUMIN 1.9*   No results for input(s): LIPASE, AMYLASE in the last 168 hours. No results for input(s): AMMONIA in the last 168 hours. Coagulation Profile: No results for input(s): INR, PROTIME in the last 168 hours. Cardiac Enzymes:  Recent Labs Lab 06/01/16 1049  CKTOTAL 350*   BNP (last 3 results) No results for input(s): PROBNP in the last 8760 hours. HbA1C: No results for input(s): HGBA1C in the last 72 hours. CBG: No results for input(s): GLUCAP in the last 168 hours. Lipid Profile: No results for input(s): CHOL, HDL, LDLCALC, TRIG, CHOLHDL, LDLDIRECT in the last 72 hours. Thyroid Function Tests: No results for input(s): TSH, T4TOTAL, FREET4, T3FREE, THYROIDAB in the last 72 hours. Anemia Panel: No results for input(s): VITAMINB12, FOLATE, FERRITIN, TIBC, IRON, RETICCTPCT in the last 72 hours. Urine analysis:    Component Value Date/Time   COLORURINE YELLOW 06/01/2016 1145    APPEARANCEUR CLEAR 06/01/2016 1145   LABSPEC 1.009 06/01/2016 1145   PHURINE 6.0 06/01/2016 1145   GLUCOSEU NEGATIVE 06/01/2016 1145   HGBUR NEGATIVE 06/01/2016 1145   BILIRUBINUR NEGATIVE 06/01/2016 1145   KETONESUR 5 (A) 06/01/2016 1145   PROTEINUR NEGATIVE 06/01/2016 1145   UROBILINOGEN 1.0 06/18/2014 1316   NITRITE POSITIVE (A) 06/01/2016 1145   LEUKOCYTESUR NEGATIVE 06/01/2016 1145   Sepsis Labs: @LABRCNTIP (procalcitonin:4,lacticidven:4) )No results found for this or any previous visit (from the past 240 hour(s)).   Radiological Exams on Admission: Ct Head Wo Contrast  Result Date: 06/01/2016 CLINICAL DATA:  41 year old female with history of weakness all over for 2 weeks. Multiple falls in the past 2 weeks. EXAM: CT HEAD WITHOUT CONTRAST TECHNIQUE: Contiguous axial images were obtained from the base of the skull through the vertex without intravenous contrast. COMPARISON:  Head CT 02/10/2016. FINDINGS: Brain: No evidence of acute infarction, hemorrhage, hydrocephalus, extra-axial collection or mass lesion/mass effect. Vascular: No hyperdense vessel or unexpected calcification. Skull: Normal. Negative for fracture or focal lesion. Sinuses/Orbits: No acute finding. Other: None. IMPRESSION: 1. No acute intracranial abnormalities. 2. Normal appearance of the brain. Electronically Signed   By: Vinnie Langton M.D.   On: 06/01/2016 11:59    EKG: (Independently reviewed) sinus rhythm with ventricular rate 92 bpm, QTC 625 ms, no acute ischemic changes  Assessment/Plan Principal Problem:   Acute encephalopathy -Patient presents with acute altered mental status accompanied by recent falls in setting of unintentional overdose of home pain medications -Patient also takes the following medications that can influence mentation: Xanax, Pamelor and Lyrica -We'll hold narcotics but continue Xanax, Pamelor and Lyrica; consider decreasing Lyrica dosage -Follow up on urinalysis and culture obtained  in the ER  Active Problems:   Acute hypokalemia/Dehydration/elevated CK -Given IV potassium and  ER -Continue home potassium taken with diuretics but hold diuretics -Follow electrolytes -Continue IV fluids -Repeat CK in a.m.    Overdose of opiate or related narcotic, accidental or unintentional, initial encounter -As documented by EMS: patient had prescription filled on 3/15 for 90 tabs of 10-325 Vicodin with only 9 pills left upon their inspection of the pill bottle -Primary care provider documented the above documented prescription/pills must last for 30 days therefore it is unlikely they will give her additional refills for this medication in the future and we will not prescribe at discharge as well -I discussed with patient need to appropriately use medications as prescribed not only narcotic pain medications but all medications -We also discussed the poor efficacy of long-term chronic narcotic use for pain management and primary goal is to treat underlying conditions causing the pain -Unable to take NSAIDs secondary to history of gastric bypass surgery and likely concomitant vagotomy -Patient cares for her autistic son at home and I suspect has had long-term issues with anxiety and depression made worse by her current health status-she may benefit from psychiatric evaluation in the outpatient setting    Bilateral lower extremity edema -Patient has had issues with long time lower extremity edema likely a combination of obesity related lymphedema as well as chronic hypoalbuminemia from history of gastric bypass surgery/chronic malabsorption -Echocardiogram December 2017: Preserved LV function, no left ventricular diastolic dysfunction therefore no reason to suspect congestive heart failure as etiology -Recommend cautious use of diuretics in patient with hypoalbuminemia and likely associated intravascular volume depletion with frequent falls -For completeness of exam will check lower extremity  duplex to rule out chronic DVT -Recommend outpatient vascular evaluation regarding the lymphedema and evaluation for compressive devices such as lymphedema sleeves to the legs    Chronic left hip pain/knee pain -Patient apparently set up with local orthopedist for initial visit today -Due to obesity and need for detailed imaging will pursue MR of left hip and knee while hospitalized especially with recent falls -PT/OT evaluation -Ultram for pain in addition to above-mentioned medications    Lower extremity cellulitis -Low index of suspicion this is actually cellulitis with findings more consistent with stasis dermatitis -Have narrowed antibiotics to Keflex -For completeness of exam check blood cultures    Radial nerve palsy, right -Outpatient nerve conduction studies pending -Patient reports that this as well as all of the above problems negatively influencing quality of life     Hypertension -Presenting blood pressures low with systolic readings in the 25Z secondary to dehydration in patient with poor oral intake, excessive narcotic use, and concomitant use of diuretics -Hold preadmission Toprol    H/O gastric bypass -Suspect patient has chronic malabsorption status post bypass surgery -Nutrition consultation -Patient complaining of skin peeling from fingertips and palms of feet and she is can that she has psoriatic arthritis but I suspect this is more related to poor nutrition -Anemia panel -CRP, ESR, ANA and rheumatoid factor -In regards to bariatric diet she will require small frequent feedings    Chronic abdominal pain -Has been ongoing since bypass surgery and patient reports multiple providers given her recommendations for diet and other remedies which have not been successful -Would not treat with narcotics in the future -Consider outpatient referral to tertiary facility that specializes in bariatric surgery issues specifically gastroenterologist -Initial bariatric procedure  apparently performed in [place]    Hypothyroidism -Continue Synthroid -Patient had 2 doses of Synthroid listed on medication reconciliation: One was for 137 g the other was  for 125 g -I chose the higher dose -Check TSH    Iron deficiency anemia -See above    Marijuana abuse -Counseled regarding cessation -GI symptoms could be precipitated by excessive marijuana use he if emesis is involved   Prolonged QT interval -Noted on EKG -Repeat in a.m. and if necessary discontinue potential offending medications      DVT prophylaxis: Lovenox Code Status: Full  Family Communication: Family at bedside Disposition Plan: Home Consults called: None     Bryton Romagnoli L. ANP-BC [facility] Pager [phone]   If 7PM-7AM, please contact night-coverage [email or web address] Password [facility]  06/01/2016, 2:01 PM

## 2016-06-01 NOTE — ED Notes (Signed)
Rennis HardingEllis NP notified of CBG.

## 2016-06-01 NOTE — ED Provider Notes (Signed)
[facility]-EMERGENCY DEPT Provider Note   CSN: [id number] Arrival date & time: 06/01/16  8657     History   Chief Complaint Chief Complaint  Patient presents with  . Weakness    HPI Dorothy Dyer is a 41 y.o. female.  HPI   Patient presents with sleepiness, concern for altered mental status. Per patient, her family had noted that she was not acting herself, very sleepy, falling asleep during conversations. This morning her sister-in-law was unable to wake her, and called EMS. On EMS arrival, patient did wake to sternal rub. At this time, she appears sleepy and fatigue, however is appropriately answering questions.  Reports 2 years of hip pain, taking norco, pt reports taking 5 throughout the day yesterday. Reports not taking the xanax, uses it occasionally, not recently. Family reports that they're concerned that her medication use is likely contributing to her somnolence.  Fell 2 days ago and last week, not sure if she hit her head, however reports she feels like she has tender spots on her head. Reports the hip pain is overall not changed, however has been chronically worsening. Thinks it may be worsened after the fall. No fevers, chest pain, sob, nausea, vomiting, diarrhea, bloody stools Not eating well Reports chronic swelling in her bilateral lower extremities, with some chronic erythema due to venous stasis. Reports she is not looked and is not sure if it appears at baseline.   Past Medical History:  Diagnosis Date  . Acid reflux   . Anemia   . Anxiety   . Arthritis   . AVN of femur (HCC)    left hip  . Clotting disorder (HCC)    undetermined  . Cystitis   . 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 Hypertension   . Neuromuscular disorder (HCC)   . Osteoporosis   . Psoriasis   . Thyroid disease     Patient Active Problem List   Diagnosis Date Noted  . Acute encephalopathy 06/01/2016  . Chronic left hip pain 06/01/2016  . Overdose of opiate or related  narcotic, accidental or unintentional, initial encounter 06/01/2016  . Lower extremity cellulitis 06/01/2016  . 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 Left knee pain 06/01/2016  . Bilateral lower extremity edema 06/01/2016  . Acute hypokalemia 02/11/2016  . Hypoglycemia 02/11/2016  . Marijuana abuse 02/11/2016  . Chronic narcotic use 02/11/2016  . AKI (acute kidney injury) (HCC) 02/10/2016  . AVN of femur (HCC)   . 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 Hoarseness 06/17/2014  . 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 Fibromyalgia 06/17/2014  . Chronic abdominal pain 06/17/2014  . Knee pain, bilateral 06/17/2014  . Vitamin D deficiency 06/17/2014  . Vitamin B12 deficiency 06/17/2014  . Iron deficiency anemia 06/17/2014  . Thyroid activity decreased 06/17/2014  . Abnormal Pap smear of cervix 06/17/2014  . Anemia   . Anxiety   . Arthritis   . Acid reflux   . Neuromuscular disorder (HCC)   . Hypertension   . Osteoporosis   . HPV (human papilloma virus) infection     Past Surgical History:  Procedure Laterality Date  . CHOLECYSTECTOMY    . ESOPHAGOGASTRODUODENOSCOPY  10/2014   at [facility], was normal. Dr. Merri Brunette  . GASTRIC BYPASS  05/2001  . SMALL INTESTINE SURGERY  2004   exploratory    OB History    No data available       Home Medications    Prior to Admission medications   Medication Sig Start  Date End Date Taking? Authorizing Provider  ACIPHEX 20 MG tablet TAKE 1 TABLET BY MOUTH TWICE A DAY Patient taking differently: TAKE 20mg  TABLET BY MOUTH TWICE A DAY 01/19/16   Donita Brooks, MD  ALPRAZolam Prudy Feeler) 0.5 MG tablet TAKE 1 TABLET BY MOUTH TWICE A DAY AS NEEDED 04/13/16   Donita Brooks, MD  feeding supplement, ENSURE ENLIVE, (ENSURE ENLIVE) LIQD Take 237 mLs by mouth 2 (two) times daily between meals. 02/12/16   Nishant Dhungel, MD  furosemide (LASIX) 40 MG tablet Take 1 tablet (40 mg total) by mouth daily as needed. 03/31/16   Donita Brooks, MD  HYDROcodone-acetaminophen  (NORCO) 10-325 MG tablet Take 1 tablet by mouth 3 (three) times daily as needed for moderate pain. Must last 30 days 05/25/16   Dorena Bodo, PA-C  levothyroxine (SYNTHROID, LEVOTHROID) 125 MCG tablet TAKE 1 TABLET EVERY DAY Patient taking differently: TAKE 125mg  TABLET by mouth  EVERY other DAY 11/25/15   Donita Brooks, MD  levothyroxine (SYNTHROID, LEVOTHROID) 137 MCG tablet TAKE 1 TABLET BY MOUTH DAILY BEFORE BREAKFAST. 02/28/16   Donita Brooks, MD  metolazone (ZAROXOLYN) 5 MG tablet Take 1 tablet (5 mg total) by mouth daily. Take zaroxylyn 5 mg 30 minutes before lasix 1-2 times a week. 01/10/16   Donita Brooks, MD  metoprolol succinate (TOPROL-XL) 50 MG 24 hr tablet Take 1 tablet (50 mg total) by mouth 2 (two) times daily. 12/24/15   Donita Brooks, MD  mometasone (ELOCON) 0.1 % cream APPLY TO AFFECTED AREA EVERY DAY Patient taking differently: APPLY TO AFFECTED AREA daily prn for scorisis 01/27/16   Donita Brooks, MD  nortriptyline (PAMELOR) 75 MG capsule TAKE ONE CAPSULE BY MOUTH EVERY EVENING AT BEDTIME 03/27/16   Donita Brooks, MD  ondansetron (ZOFRAN) 4 MG tablet TAKE 1 TABLET (4 MG TOTAL) BY MOUTH EVERY 8 (EIGHT) HOURS AS NEEDED FOR NAUSEA OR VOMITING. 12/28/14   Donita Brooks, MD  potassium chloride SA (K-DUR,KLOR-CON) 20 MEQ tablet Take 1 tablet (20 mEq total) by mouth 2 (two) times daily. Patient taking differently: Take 20 mEq by mouth 2 (two) times daily as needed (when taking lasix).  03/25/15   Donita Brooks, MD  pregabalin (LYRICA) 225 MG capsule TAKE 225mg  CAPSULE BY MOUTH TWICE A DAY 04/06/16   Donita Brooks, MD  simethicone (MYLICON) 80 MG chewable tablet Chew 80 mg by mouth every 6 (six) hours as needed for flatulence.    Historical Provider, MD  sucralfate (CARAFATE) 1 g tablet Take 1 tablet (1 g total) by mouth 4 (four) times daily. Patient taking differently: Take 1 g by mouth 4 (four) times daily as needed (stomach coating).  12/28/15   Donita Brooks,  MD  topiramate (TOPAMAX) 50 MG tablet TAKE 1 TABLET BY MOUTH AT BEDTIME Patient taking differently: TAKE 50mg  TABLET BY MOUTH AT BEDTIME 01/31/16   Donita Brooks, MD    Family History Family History  Problem Relation Age of Onset  . Depression Mother   . Diabetes Mother   . Hyperlipidemia Mother   . Depression Father   . Hearing loss Father   . Hyperlipidemia Father   . Hypertension Father   . Learning disabilities Father   . Mental illness Father   . Asthma Brother   . Alcohol abuse Maternal Grandmother   . Arthritis Maternal Grandmother   . Cancer Maternal Grandmother   . Cancer Maternal Grandfather   . Mental illness Paternal  Grandmother   . Heart disease Paternal Grandfather   . Stroke Paternal Grandfather     Social History Social History  Substance Use Topics  . Smoking status: Current Every Day Smoker    Packs/day: 0.50    Types: Cigarettes  . Smokeless tobacco: Never Used  . Alcohol use 6.0 oz/week    10 Shots of liquor per week     Allergies   Ibuprofen; Toradol [ketorolac tromethamine]; Aspirin; and Cleocin [clindamycin hcl]   Review of Systems Review of Systems  Constitutional: Positive for fatigue. Negative for appetite change (chronically low) and fever.  HENT: Positive for sore throat.   Eyes: Negative for visual disturbance.  Respiratory: Negative for cough and shortness of breath.   Cardiovascular: Negative for chest pain.  Gastrointestinal: Negative for abdominal pain, blood in stool, diarrhea, nausea and vomiting.  Genitourinary: Negative for difficulty urinating.  Musculoskeletal: Positive for arthralgias. Negative for back pain and neck pain.  Skin: Negative for rash.  Neurological: Negative for syncope and headaches.     Physical Exam Updated Vital Signs BP (!) 97/55 (BP Location: Right Arm)   Pulse 97   Temp 97.5 F (36.4 C) (Oral)   Resp 20   Ht 5' 1.5" (1.562 m)   Wt 180 lb (81.6 kg)   SpO2 97%   BMI 33.46 kg/m    Physical Exam  Constitutional: She is oriented to person, place, and time. She appears well-developed and well-nourished. She has a sickly appearance. She appears ill. No distress.  Sleepy, however answering questions appropriately  HENT:  Head: Normocephalic and atraumatic.  Eyes: Conjunctivae and EOM are normal.  Neck: Normal range of motion.  Cardiovascular: Normal rate, regular rhythm, normal heart sounds and intact distal pulses.  Exam reveals no gallop and no friction rub.   No murmur heard. Pulmonary/Chest: Effort normal and breath sounds normal. No respiratory distress. She has no wheezes. She has no rales.  Abdominal: Soft. She exhibits no distension. There is no tenderness. There is no guarding.  Musculoskeletal: She exhibits no edema.       Left hip: She exhibits decreased range of motion, tenderness and bony tenderness.  Neurological: She is oriented to person, place, and time.  Skin: Skin is warm and dry. No rash noted. She is not diaphoretic. No erythema.  Multiple bruises over arms Finger tips with pinpoint lacerations       Nursing note and vitals reviewed.    ED Treatments / Results  Labs (all labs ordered are listed, but only abnormal results are displayed) Labs Reviewed  CBC WITH DIFFERENTIAL/PLATELET - Abnormal; Notable for the following:       Result Value   WBC 3.8 (*)    RBC 3.37 (*)    Hemoglobin 10.8 (*)    HCT 31.8 (*)    Neutro Abs 1.3 (*)    All other components within normal limits  COMPREHENSIVE METABOLIC PANEL - Abnormal; Notable for the following:    Potassium 2.9 (*)    Chloride 100 (*)    Glucose, Bld 56 (*)    BUN 21 (*)    Calcium 7.9 (*)    Total Protein 5.6 (*)    Albumin 1.9 (*)    Alkaline Phosphatase 150 (*)    All other components within normal limits  ACETAMINOPHEN LEVEL - Abnormal; Notable for the following:    Acetaminophen (Tylenol), Serum <10 (*)    All other components within normal limits  RAPID URINE DRUG SCREEN,  HOSP PERFORMED -  Abnormal; Notable for the following:    Opiates POSITIVE (*)    Tetrahydrocannabinol POSITIVE (*)    All other components within normal limits  URINALYSIS, ROUTINE W REFLEX MICROSCOPIC - Abnormal; Notable for the following:    Ketones, ur 5 (*)    Nitrite POSITIVE (*)    Bacteria, UA RARE (*)    Squamous Epithelial / LPF 0-5 (*)    All other components within normal limits  CK - Abnormal; Notable for the following:    Total CK 350 (*)    All other components within normal limits  CBG MONITORING, ED - Abnormal; Notable for the following:    Glucose-Capillary 51 (*)    All other components within normal limits  CULTURE, BLOOD (ROUTINE X 2)  CULTURE, BLOOD (ROUTINE X 2)  URINE CULTURE  CULTURE, BLOOD (ROUTINE X 2)  CULTURE, BLOOD (ROUTINE X 2)  SALICYLATE LEVEL  ETHANOL  TSH  PROCALCITONIN  VITAMIN B12  FOLATE  IRON AND TIBC  FERRITIN  RETICULOCYTES  C-REACTIVE PROTEIN  ANTINUCLEAR ANTIBODIES, IFA  PREALBUMIN  RHEUMATOID FACTOR  SEDIMENTATION RATE  I-STAT CG4 LACTIC ACID, ED  POC URINE PREG, ED  I-STAT CG4 LACTIC ACID, ED    EKG  EKG Interpretation  Date/Time:  Thursday June 01 2016 09:41:07 EDT Ventricular Rate:  92 PR Interval:    QRS Duration: 93 QT Interval:  505 QTC Calculation: 625 R Axis:   84 Text Interpretation:  Sinus rhythm Artifact in lead(s) I II III aVR aVL V2 Interpretation limited secondary to artifact No acute ST changes Similar to prior with exception of artifact Confirmed by [facility] MD, Tion Tse ([id number]) on 06/01/2016 5:01:01 PM       Radiology Ct Head Wo Contrast  Result Date: 06/01/2016 CLINICAL DATA:  41 year old female with history of weakness all over for 2 weeks. Multiple falls in the past 2 weeks. EXAM: CT HEAD WITHOUT CONTRAST TECHNIQUE: Contiguous axial images were obtained from the base of the skull through the vertex without intravenous contrast. COMPARISON:  Head CT 02/10/2016. FINDINGS: Brain: No evidence of acute  infarction, hemorrhage, hydrocephalus, extra-axial collection or mass lesion/mass effect. Vascular: No hyperdense vessel or unexpected calcification. Skull: Normal. Negative for fracture or focal lesion. Sinuses/Orbits: No acute finding. Other: None. IMPRESSION: 1. No acute intracranial abnormalities. 2. Normal appearance of the brain. Electronically Signed   By: Trudie Reed M.D.   On: 06/01/2016 11:59    Procedures Procedures (including critical care time)  Medications Ordered in ED Medications  piperacillin-tazobactam (ZOSYN) IVPB 3.375 g (not administered)  vancomycin (VANCOCIN) IVPB 1000 mg/200 mL premix (not administered)  0.9 %  sodium chloride infusion (not administered)  potassium chloride SA (K-DUR,KLOR-CON) CR tablet 40 mEq (not administered)  traMADol (ULTRAM) tablet 50 mg (not administered)  potassium chloride SA (K-DUR,KLOR-CON) CR tablet 40 mEq (not administered)  sodium chloride 0.9 % bolus 1,000 mL (0 mLs Intravenous Stopped 06/01/16 1403)  sodium chloride 0.9 % bolus 1,000 mL (0 mLs Intravenous Stopped 06/01/16 1403)  vancomycin (VANCOCIN) 1,500 mg in sodium chloride 0.9 % 500 mL IVPB (1,500 mg Intravenous New Bag/Given 06/01/16 1253)  piperacillin-tazobactam (ZOSYN) IVPB 3.375 g (0 g Intravenous Stopped 06/01/16 1240)  dextrose 50 % solution 50 mL (50 mLs Intravenous Given 06/01/16 1220)  gadobenate dimeglumine (MULTIHANCE) injection 20 mL (20 mLs Intravenous Contrast Given 06/01/16 1620)     Initial Impression / Assessment and Plan / ED Course  I have reviewed the triage vital signs and the nursing notes.  Pertinent labs &  imaging results that were available during my care of the patient were reviewed by me and considered in my medical decision making (see chart for details).     41 year old female with a history of chronic pain and narcotic use, fibromyalgia, hypertension, possible neuromuscular disorder presents with concern for altered mental status. Family reports  that she has been frequently falling asleep, not acting herself. On arrival to the emergency department, her blood pressures are 90 systolic, without tachycardia, no fever. Patient is sleepy, however able to appropriately answer all questions, protecting her airway.  Suspect patient's blood pressures and altered mental status or secondary to narcotic use as well as dehydration. Given patient does have some asymmetry asymmetric erythema of her bilateral lower extremities, with concern for venous stasis versus cellulitis, and is hypotensive, we'll cover with antibiotics given possibility that symptoms could be sepsis.  Patient also reports fall, and given altered mental status and fall, head CT was ordered which showed no acute findings. X-ray of the hip was and chest were ordered. Urinalysis shows no sign of UTI other than positive nitrite. . Pregnancy test is negative. Given patient taking Norco, and appears to have missed his medication, Tylenol levels were ordered and are WNL.   Patient is given 2 L of normal saline. Given potassium and D50 for hypokalemia/glycemia.   Blood pressures improved with fluids.  Suspect more likely dehydration and medication misuse, however will admit to observation. Lower suspicion for sepsis secondary to LLE cellulitis however feel pt needs observation.       Final Clinical Impressions(s) / ED Diagnoses   Final diagnoses:  Hypotension due to drugs  Somnolence  Dehydration  Hypoglycemia  Hypokalemia  Pain of left hip joint  Fall, initial encounter    New Prescriptions New Prescriptions   No medications on file     Alvira Monday, MD 06/01/16 1703

## 2016-06-01 NOTE — ED Notes (Signed)
Pt refused po potassium at this time.

## 2016-06-01 NOTE — ED Notes (Addendum)
Pt in b/r

## 2016-06-01 NOTE — ED Notes (Signed)
Iv attempted x 1 witout success.

## 2016-06-01 NOTE — ED Notes (Signed)
Got patient up to the bathroom patient is getting a urine sample

## 2016-06-01 NOTE — Progress Notes (Signed)
Pharmacy Antibiotic Note  Dorothy Dyer is a 41 y.o. female admitted on 06/01/2016 with cellulitis.  Pharmacy has been consulted for vancomycin and zosyn dosing. Pt is afebrile, WBC is low at 3.8, Scr and lactic acid are WNL.   Plan: Vancomycin 1500mg  IV x 1 then 1gm IV Q12H Zosyn 3.375gm IV Q8H (4 hr inf) F/u renal fxn, C&S, clinical status and trough at SS  Height: 5' 1.5" (156.2 cm) Weight: 180 lb (81.6 kg) IBW/kg (Calculated) : 48.95  Temp (24hrs), Avg:97.5 F (36.4 C), Min:97.5 F (36.4 C), Max:97.5 F (36.4 C)  No results for input(s): WBC, CREATININE, LATICACIDVEN, VANCOTROUGH, VANCOPEAK, VANCORANDOM, GENTTROUGH, GENTPEAK, GENTRANDOM, TOBRATROUGH, TOBRAPEAK, TOBRARND, AMIKACINPEAK, AMIKACINTROU, AMIKACIN in the last 168 hours.  CrCl cannot be calculated (Patient's most recent lab result is older than the maximum 21 days allowed.).    Allergies  Allergen Reactions  . Ibuprofen Nausea Only    Bad stomach pains  . Toradol [Ketorolac Tromethamine] Anxiety  . Aspirin Hives  . Cleocin [Clindamycin Hcl] Itching and Rash    Antimicrobials this admission: Vanc 3/22>> Zosyn 3/22>>  Dose adjustments this admission: N/A  Microbiology results: Pending  Thank you for allowing pharmacy to be a part of this patient's care.  Nayali Talerico, Drake LeachRachel Lynn 06/01/2016 10:44 AM

## 2016-06-01 NOTE — ED Notes (Signed)
IV potassium DC per VO Dr. Corrie DandySchlossmen.

## 2016-06-01 NOTE — Progress Notes (Addendum)
Acute hypoglycemia -recurrent- not on hypoglycemic agents prior to admission and was low at presentation presumed 2/2 poor oral intake in setting of acute encephalopathy-repeat 51 so will give additional amp D50 -Follow POCT CBGs q 4hrs until better -change IVFs to D5NS at 125/hr -given low BP, hypokalemia and hypoglycemia will ck random cortisol -Mg 2 gm IV- level not collected but with low K suspect low Mg  Junious SilkAllison Ellis, ANP

## 2016-06-01 NOTE — ED Notes (Signed)
Updated report to Herbert Seta, RN

## 2016-06-01 NOTE — ED Notes (Addendum)
Patient transported to MRI 

## 2016-06-02 ENCOUNTER — Observation Stay (HOSPITAL_BASED_OUTPATIENT_CLINIC_OR_DEPARTMENT_OTHER): Payer: Medicare HMO

## 2016-06-02 DIAGNOSIS — G8929 Other chronic pain: Secondary | ICD-10-CM | POA: Diagnosis not present

## 2016-06-02 DIAGNOSIS — R600 Localized edema: Secondary | ICD-10-CM | POA: Diagnosis not present

## 2016-06-02 DIAGNOSIS — R109 Unspecified abdominal pain: Secondary | ICD-10-CM | POA: Diagnosis not present

## 2016-06-02 DIAGNOSIS — E162 Hypoglycemia, unspecified: Secondary | ICD-10-CM | POA: Diagnosis not present

## 2016-06-02 DIAGNOSIS — M25552 Pain in left hip: Secondary | ICD-10-CM | POA: Diagnosis not present

## 2016-06-02 DIAGNOSIS — E43 Unspecified severe protein-calorie malnutrition: Secondary | ICD-10-CM | POA: Diagnosis not present

## 2016-06-02 DIAGNOSIS — M79609 Pain in unspecified limb: Secondary | ICD-10-CM | POA: Diagnosis not present

## 2016-06-02 DIAGNOSIS — E876 Hypokalemia: Secondary | ICD-10-CM | POA: Diagnosis not present

## 2016-06-02 DIAGNOSIS — G9340 Encephalopathy, unspecified: Secondary | ICD-10-CM | POA: Diagnosis not present

## 2016-06-02 DIAGNOSIS — Z9889 Other specified postprocedural states: Secondary | ICD-10-CM | POA: Diagnosis not present

## 2016-06-02 LAB — CBC
HEMATOCRIT: 29.1 % — AB (ref 36.0–46.0)
HEMOGLOBIN: 9.9 g/dL — AB (ref 12.0–15.0)
MCH: 32.6 pg (ref 26.0–34.0)
MCHC: 34 g/dL (ref 30.0–36.0)
MCV: 95.7 fL (ref 78.0–100.0)
Platelets: 147 10*3/uL — ABNORMAL LOW (ref 150–400)
RBC: 3.04 MIL/uL — AB (ref 3.87–5.11)
RDW: 14.5 % (ref 11.5–15.5)
WBC: 4.6 10*3/uL (ref 4.0–10.5)

## 2016-06-02 LAB — MAGNESIUM: Magnesium: 1.3 mg/dL — ABNORMAL LOW (ref 1.7–2.4)

## 2016-06-02 LAB — BASIC METABOLIC PANEL
Anion gap: 8 (ref 5–15)
BUN: 6 mg/dL (ref 6–20)
CHLORIDE: 107 mmol/L (ref 101–111)
CO2: 24 mmol/L (ref 22–32)
Calcium: 8 mg/dL — ABNORMAL LOW (ref 8.9–10.3)
Creatinine, Ser: 0.66 mg/dL (ref 0.44–1.00)
GFR calc Af Amer: >60 mL/min (ref >60)
GLUCOSE: 100 mg/dL — AB (ref 65–99)
POTASSIUM: 3.9 mmol/L (ref 3.5–5.1)
Sodium: 139 mmol/L (ref 135–145)

## 2016-06-02 LAB — COMPREHENSIVE METABOLIC PANEL
ALBUMIN: 1.6 g/dL — AB (ref 3.5–5.0)
ALT: 30 U/L (ref 14–54)
ANION GAP: 10 (ref 5–15)
AST: 24 U/L (ref 15–41)
Alkaline Phosphatase: 124 U/L (ref 38–126)
BUN: 10 mg/dL (ref 6–20)
CHLORIDE: 105 mmol/L (ref 101–111)
CO2: 26 mmol/L (ref 22–32)
Calcium: 7.3 mg/dL — ABNORMAL LOW (ref 8.9–10.3)
Creatinine, Ser: 0.71 mg/dL (ref 0.44–1.00)
GFR calc non Af Amer: >60 mL/min (ref >60)
Glucose, Bld: 83 mg/dL (ref 65–99)
Potassium: 2.5 mmol/L — CL (ref 3.5–5.1)
SODIUM: 141 mmol/L (ref 135–145)
Total Bilirubin: 1 mg/dL (ref 0.3–1.2)
Total Protein: 3.9 g/dL — ABNORMAL LOW (ref 6.5–8.1)

## 2016-06-02 LAB — GLUCOSE, CAPILLARY
GLUCOSE-CAPILLARY: 118 mg/dL — AB (ref 65–99)
GLUCOSE-CAPILLARY: 95 mg/dL (ref 65–99)
GLUCOSE-CAPILLARY: 99 mg/dL (ref 65–99)
Glucose-Capillary: 82 mg/dL (ref 65–99)
Glucose-Capillary: 104 mg/dL — ABNORMAL HIGH (ref 65–99)
Glucose-Capillary: 130 mg/dL — ABNORMAL HIGH (ref 65–99)

## 2016-06-02 LAB — ANTINUCLEAR ANTIBODIES, IFA: ANA Ab, IFA: NEGATIVE

## 2016-06-02 LAB — CK: Total CK: 191 U/L (ref 38–234)

## 2016-06-02 LAB — HIV ANTIBODY (ROUTINE TESTING W REFLEX): HIV Screen 4th Generation wRfx: NONREACTIVE

## 2016-06-02 LAB — RHEUMATOID FACTOR: Rhuematoid fact SerPl-aCnc: <10 IU/mL (ref 0.0–13.9)

## 2016-06-02 MED ORDER — VANCOMYCIN HCL IN DEXTROSE 1-5 GM/200ML-% IV SOLN
1000.0000 mg | Freq: Two times a day (BID) | INTRAVENOUS | Status: DC
  Filled 2016-06-02: qty 200

## 2016-06-02 MED ORDER — OMEGA-3-ACID ETHYL ESTERS 1 G PO CAPS
1.0000 g | ORAL_CAPSULE | Freq: Every day | ORAL | Status: DC
  Administered 2016-06-02 – 2016-06-03 (×2): 1 g via ORAL
  Filled 2016-06-02 (×2): qty 1

## 2016-06-02 MED ORDER — CALCIUM CARBONATE-VITAMIN D 500-200 MG-UNIT PO TABS
2.0000 | ORAL_TABLET | Freq: Every day | ORAL | Status: DC
  Administered 2016-06-03: 2 via ORAL
  Filled 2016-06-02: qty 1

## 2016-06-02 MED ORDER — VITAMIN B-1 100 MG PO TABS
100.0000 mg | ORAL_TABLET | Freq: Every day | ORAL | Status: DC
  Administered 2016-06-02 – 2016-06-03 (×2): 100 mg via ORAL
  Filled 2016-06-02 (×2): qty 1

## 2016-06-02 MED ORDER — ADULT MULTIVITAMIN W/MINERALS CH
1.0000 | ORAL_TABLET | Freq: Two times a day (BID) | ORAL | Status: DC
  Administered 2016-06-02 – 2016-06-03 (×3): 1 via ORAL
  Filled 2016-06-02 (×3): qty 1

## 2016-06-02 MED ORDER — UNJURY CHICKEN SOUP POWDER
8.0000 [oz_av] | Freq: Two times a day (BID) | ORAL | Status: DC
Start: 2016-06-02 — End: 2016-06-03
  Administered 2016-06-02 – 2016-06-03 (×4): 8 [oz_av] via ORAL
  Filled 2016-06-02 (×4): qty 27

## 2016-06-02 MED ORDER — PREMIER PROTEIN SHAKE
11.0000 [oz_av] | Freq: Two times a day (BID) | ORAL | Status: DC
  Administered 2016-06-02: 11 [oz_av] via ORAL
  Filled 2016-06-02 (×4): qty 325.31

## 2016-06-02 MED ORDER — MAGNESIUM SULFATE 4 GM/100ML IV SOLN
4.0000 g | Freq: Once | INTRAVENOUS | Status: AC
  Administered 2016-06-02: 4 g via INTRAVENOUS
  Filled 2016-06-02: qty 100

## 2016-06-02 MED ORDER — PANTOPRAZOLE SODIUM 40 MG PO TBEC
40.0000 mg | DELAYED_RELEASE_TABLET | Freq: Two times a day (BID) | ORAL | Status: DC
Start: 2016-06-02 — End: 2016-06-03
  Administered 2016-06-02 – 2016-06-03 (×4): 40 mg via ORAL
  Filled 2016-06-02 (×4): qty 1

## 2016-06-02 MED ORDER — B COMPLEX-C PO TABS
1.0000 | ORAL_TABLET | Freq: Every day | ORAL | Status: DC
  Administered 2016-06-02 – 2016-06-03 (×2): 1 via ORAL
  Filled 2016-06-02 (×2): qty 1

## 2016-06-02 MED ORDER — CALCIUM CITRATE-VITAMIN D 500-400 MG-UNIT PO CHEW
1.0000 | CHEWABLE_TABLET | Freq: Three times a day (TID) | ORAL | Status: DC
  Administered 2016-06-02: 1 via ORAL
  Filled 2016-06-02 (×2): qty 1

## 2016-06-02 MED ORDER — POTASSIUM CHLORIDE CRYS ER 20 MEQ PO TBCR
40.0000 meq | EXTENDED_RELEASE_TABLET | Freq: Three times a day (TID) | ORAL | Status: AC
  Administered 2016-06-02 (×3): 40 meq via ORAL
  Filled 2016-06-02 (×3): qty 2

## 2016-06-02 NOTE — Progress Notes (Signed)
**  Preliminary report by tech**  Bilateral lower extremity venous duplex completed. There is no evidence of deep or superficial vein thrombosis involving the right and left lower extremities. All visualized vessels appear patent and compressible. There is no evidence of Baker's cysts bilaterally.  06/02/16 4:10 PM Olen CordialGreg Torsten Weniger RVT

## 2016-06-02 NOTE — Care Management Note (Signed)
Case Management Note  Patient Details  Name: Dorothy PeersCrystal Dyer MRN: [id number] Date of Birth: 01/18/1976  Subjective/Objective:    Pt in with acute encephalopathy. She is from home with her children.                 Action/Plan: Awaiting PT/OT recommendations. CM following for d/c needs, physician orders.  Expected Discharge Date:                  Expected Discharge Plan:     In-House Referral:     Discharge planning Services     Post Acute Care Choice:    Choice offered to:     DME Arranged:    DME Agency:     HH Arranged:    HH Agency:     Status of Service:  In process, will continue to follow  If discussed at Long Length of Stay Meetings, dates discussed:    Additional Comments:  Kermit BaloKelli F Aylen Rambert, RN 06/02/2016, 1:31 PM

## 2016-06-02 NOTE — Progress Notes (Signed)
CBG was 68 at 2042. Asymptomatic. Juice administered. Rechecked CBG 104.

## 2016-06-02 NOTE — Progress Notes (Addendum)
PROGRESS NOTE   Dorothy Dyer  ZOX:[id number]    DOB: 1975-03-15    DOA: 06/01/2016  PCP: Leo Grosser, MD   I have briefly reviewed patients previous medical records in [facility].  Brief Narrative:  41 year old female, moved from [place] to [place] approximately 2 years ago, PMH of remote gastric bypass surgery in 2000, chronic abdominal pain (has appointment to be seen at [facility]), chronic bilateral lower extremity edema, right radial nerve palsy with outpatient neurology evaluation in progress, avascular necrosis of left hip (awaiting total hip arthroplasty pending dental, medical clearance and smoking cessation. Dr.Swintek, Ortho), iron deficiency anemia, HTN, THC abuse, recently seen by PCP and Lasix dose adjusted for edema, given Vicodin 10/325 for pain, presented to ED with altered mental status. EMS was called to patient's home by a relative who was going to take patient to her orthopedic appointment on 06/01/16 due to difficulty awakening patient and several falls over the past 2 weeks. Patient apparently was sleeping on the floor because she was too weak to get up. Review of her home medications revealed that she had been taking more than prescribed.   Assessment & Plan:   Principal Problem:   Acute encephalopathy Active Problems:   Hypertension   H/O gastric bypass   Chronic abdominal pain   Iron deficiency anemia   Acute hypokalemia   Hypoglycemia   Marijuana abuse   Chronic left hip pain   Overdose of opiate or related narcotic, accidental or unintentional, initial encounter   Lower extremity cellulitis   Radial nerve palsy, right   Left knee pain   Bilateral lower extremity edema   Protein-calorie malnutrition, severe   1. Suspected opioid overdose, unintentional with mental status changes: Patient had been using Tylenol and even Aleve despite her history of gastric bypass for pain management until recent prescription of Vicodin by PCP. I  discussed with patient's orthopedic M.D. who recommends Tylenol and Ultram for pain management. He does not recommend other opioids or lidocaine patch which does not usually work well. Mental status changes have resolved. CT head negative. 2. Hypokalemia/hypomagnesemia: replace aggressively and follow. 3. Chronic bilateral lower extremity edema: Multifactorial but probably related to nutritional/hypoalbuminemia from gastric bypass surgery and mild absorption and lymphedema versus venous stasis. 2-D echo 02/2016 with preserved LV function. Continue cautious use of diuretics. No clinical features suggestive of infection/cellulitis. Empirically started IV vancomycin and Zosyn were discontinued. Low index of suspicion for DVT but follow up lower extremity venous Dopplers. 4. Left hip and knee pain, chronic and worsening/avascular necrosis of left hip: MRI of the left hip and left knee reviewed with patient's primary orthopedic M.D. who looked at these images and indicated that patient needs to follow up with him as outpatient to plan for surgery. In the interim he recommends PT evaluation, weightbearing as tolerated, walker and pain management as above. No acute intervention in the hospital recommended. Await PT and OT evaluation. 5. Status post gastric bypass surgery and severe malnutrition in the context of chronic illness: Dietitian consultation appreciated and discussed with her gait continue supplements as recommended. 6. Hypoglycemia: Suspect secondary to poor oral intake. Initiate regular diet (patient states that she was taking regular diet at home and refuses modified diet) and then consider stopping IV D5 infusion and monitoring for recurrence area 7. Essential hypertension: Soft blood pressures. Home Toprol held. 8. Right radial nerve palsy: Outpatient follow-up. 9. Chronic abdominal pain: States that she has had this since age 65 and preceding her bariatric  surgery and has undergone extensive  evaluation and is supposed to follow up with [facility]. 10. Hypothyroid: Continue Synthroid. 11. Iron deficiency anemia/mild thrombocytopenia: Follow CBCs 12. THC abuse: Cessation counseled. 13. Prolonged QTC: Resolved on repeat EKG 3/23. Continue to replace potassium and magnesium as above. No arrhythmias on telemetry. 14. Suspect asymptomatic bacteriuria   DVT prophylaxis: Lovenox Code Status: Full Family Communication: None at bedside Disposition: Patient was placed in the hospital in observation status. However she has severe significant acute medical issues (severe electrolyte abnormalities, hypoglycemia, progressive left hip pain leading to frequent falls) that require inpatient evaluation and management that would require at least 2 midnights hospital stay and should qualify for inpatient hospitalization.   Consultants:  Discussed with orthopedics   Procedures:  None   Antimicrobials:  Discontinued IV Zosyn and vancomycin  Subjective: Seen this morning. Wanted regular food and declined heart healthy diet. Complained of heartburn despite once daily PPI. Chronic abdominal pain since teenage years. No nausea, vomiting, constipation or diarrhea. Progressively worsening left hip pain to a point where she has difficulty ambulating and has sustained multiple falls.   ROS:  no confusion. No chest pain, dyspnea, dizziness or lightheadedness.  Objective:  Vitals:   06/01/16 2104 06/02/16 0200 06/02/16 0557 06/02/16 1037  BP: 126/84 (!) 129/57 102/62 (!) 108/42  Pulse: 99 (!) 107 (!) 105 100  Resp: 20 20 20 17   Temp: 98 F (36.7 C) 98 F (36.7 C) 98 F (36.7 C) 98.5 F (36.9 C)  TempSrc: Axillary Oral Oral Oral  SpO2: 100% 99% 96% 97%  Weight:      Height:        Examination:  General exam: Patient was seen with her female RN in the room. Pleasant young female lying comfortably supine in bed. Respiratory system: Clear to auscultation. Respiratory effort  normal. Cardiovascular system: S1 & S2 heard, RRR. No JVD, murmurs, rubs, gallops or click. Bilateral leg chronic appearing edema without acute changes. Telemetry: SR-ST in the 100s.  Gastrointestinal system: Abdomen is nondistended, soft and nontender. No organomegaly or masses felt. Normal bowel sounds heard. Central nervous system: Alert and oriented. No focal neurological deficits. Extremities: Symmetric 5 x 5 power. Left lower extremity power assessment limited secondary to pain.  Skin: No rashes, lesions or ulcers. Chronic leg edema without acute changes.  Psychiatry: Judgement and insight appear normal. Mood & affect appropriate.     Data Reviewed: I have personally reviewed following labs and imaging studies  CBC:  Recent Labs Lab 06/01/16 1049 06/02/16 0418  WBC 3.8* 4.6  NEUTROABS 1.3*  --   HGB 10.8* 9.9*  HCT 31.8* 29.1*  MCV 94.4 95.7  PLT 158 147*   Basic Metabolic Panel:  Recent Labs Lab 06/01/16 1049 06/01/16 1900 06/02/16 0418  NA 139  --  141  K 2.9*  --  2.5*  CL 100*  --  105  CO2 25  --  26  GLUCOSE 56*  --  83  BUN 21*  --  10  CREATININE 0.97  --  0.71  CALCIUM 7.9*  --  7.3*  MG  --  1.6*  --   PHOS  --  3.0  --    Liver Function Tests:  Recent Labs Lab 06/01/16 1049 06/02/16 0418  AST 37 24  ALT 35 30  ALKPHOS 150* 124  BILITOT 1.2 1.0  PROT 5.6* 3.9*  ALBUMIN 1.9* 1.6*   Coagulation Profile: No results for input(s): INR, PROTIME in the last  168 hours. Cardiac Enzymes:  Recent Labs Lab 06/01/16 1049 06/02/16 0418  CKTOTAL 350* 191   HbA1C: No results for input(s): HGBA1C in the last 72 hours. CBG:  Recent Labs Lab 06/01/16 2128 06/01/16 2351 06/02/16 0326 06/02/16 0742 06/02/16 1127  GLUCAP 104* 104* 82 99 118*    Recent Results (from the past 240 hour(s))  Blood culture (routine x 2)     Status: None (Preliminary result)   Collection Time: 06/01/16 10:30 AM  Result Value Ref Range Status   Specimen  Description BLOOD LEFT FOREARM  Final   Special Requests BOTTLES DRAWN AEROBIC AND ANAEROBIC  5CC  Final   Culture NO GROWTH 1 DAY  Final   Report Status PENDING  Incomplete  Urine culture     Status: Abnormal (Preliminary result)   Collection Time: 06/01/16 10:39 AM  Result Value Ref Range Status   Specimen Description URINE, RANDOM  Final   Special Requests NONE  Final   Culture (A)  Final    >=100,000 COLONIES/mL ESCHERICHIA COLI SUSCEPTIBILITIES TO FOLLOW    Report Status PENDING  Incomplete  Blood culture (routine x 2)     Status: None (Preliminary result)   Collection Time: 06/01/16 10:55 AM  Result Value Ref Range Status   Specimen Description BLOOD RIGHT ANTECUBITAL  Final   Special Requests BOTTLES DRAWN AEROBIC AND ANAEROBIC  5CC  Final   Culture NO GROWTH 1 DAY  Final   Report Status PENDING  Incomplete         Radiology Studies: Dg Chest 2 View  Result Date: 06/01/2016 CLINICAL DATA:  Hypertension.  Evaluate for pneumonia. EXAM: CHEST  2 VIEW COMPARISON:  06/05/2014 FINDINGS: Patient slightly rotated to the left. Lungs are adequately inflated without airspace consolidation or effusion. Cardiomediastinal silhouette is within normal. Remainder of the exam is unchanged. IMPRESSION: No active cardiopulmonary disease. Electronically Signed   By: Elberta Fortisaniel  Boyle M.D.   On: 06/01/2016 18:16   Ct Head Wo Contrast  Result Date: 06/01/2016 CLINICAL DATA:  41 year old female with history of weakness all over for 2 weeks. Multiple falls in the past 2 weeks. EXAM: CT HEAD WITHOUT CONTRAST TECHNIQUE: Contiguous axial images were obtained from the base of the skull through the vertex without intravenous contrast. COMPARISON:  Head CT 02/10/2016. FINDINGS: Brain: No evidence of acute infarction, hemorrhage, hydrocephalus, extra-axial collection or mass lesion/mass effect. Vascular: No hyperdense vessel or unexpected calcification. Skull: Normal. Negative for fracture or focal lesion.  Sinuses/Orbits: No acute finding. Other: None. IMPRESSION: 1. No acute intracranial abnormalities. 2. Normal appearance of the brain. Electronically Signed   By: Trudie Reedaniel  Entrikin M.D.   On: 06/01/2016 11:59   Mr Hip Left W Wo Contrast  Result Date: 06/01/2016 CLINICAL DATA:  Chronic left hip pain.  IV drug abuse. EXAM: MRI OF THE LEFT HIP WITHOUT AND WITH CONTRAST TECHNIQUE: Multiplanar, multisequence MR imaging was performed both before and after administration of intravenous contrast. CONTRAST:  20 cc MultiHance COMPARISON:  Radiographs from 05/25/2016 FINDINGS: Bones: Chronic avascular necrosis the left femoral head with collapse and some fragmentation of essentially the entire femoral head. The junction of the femoral head and neck now pseudoarticulates with the lateral margin of the acetabulum and there 1.5 cm proximal migration of the left femur along with 2.2 cm lateral displacement of the femur with respect to the acetabulum. Joint effusion with synovitis extending around the pseudoarticulation. As expected there is some subcortical edema and low-level subcortical enhancement along the pseudoarticulation. There is  mild degenerative arthropathy of the right hip. Articular cartilage and labrum Articular cartilage: The articular cartilages essentially destroyed on the left. Labrum: Likely severely torn given the pseudoarticulation of the acetabular rim with the irregular neck of the femur. Joint or bursal effusion Joint effusion: Moderate to large joint effusion on the left with synovitis. Bursae:  No significant regional bursitis. Muscles and tendons Muscles and tendons: Abnormal muscle edema in the left gluteus musculature and also in the right gluteus minimus. Abnormal edema tracks along the deep margin of the iliotibial band without significant enhancement. Edema in the left hip adductor musculature. Other findings Miscellaneous: Subcutaneous edema lateral to the hips and tracking down the left lateral  thigh. IMPRESSION: 1. Chronic avascular necrosis of the left femoral head with collapse and some fragmentation of essentially the entire femoral head. The femur is displaced 2.2 cm lateral and 1.5 cm proximal, with the neck of the femur now pseudo articulating with the edge of the acetabulum. Surrounding moderate effusion with extensive synovitis. Septic joint not entirely excluded but certainly the appearance of synovitis would be readily explainable by the noninfectious findings. 2. Edema in regional musculature of the left hip including the gluteus musculature and left hip adductor musculature. There is also fluid tracking deep to the iliotibial band in such a fashion that fasciitis is not excluded. No visible gas in the soft tissues. Electronically Signed   By: Gaylyn Rong M.D.   On: 06/01/2016 17:22   Mr Knee Left W Wo Contrast  Result Date: 06/01/2016 CLINICAL DATA:  Swelling of the knee and acute knee pain. IV drug abuse. EXAM: MRI OF THE LEFT KNEE WITHOUT AND WITH CONTRAST TECHNIQUE: Multiplanar, multisequence MR imaging of the knee was performed before and after the administration of intravenous contrast. CONTRAST:  20mL MULTIHANCE GADOBENATE DIMEGLUMINE 529 MG/ML IV SOLN COMPARISON:  05/25/2016 conventional radiographs FINDINGS: Despite efforts by the technologist and patient, motion artifact is present on today's exam and could not be eliminated. This reduces exam sensitivity and specificity. MENISCI Medial meniscus:  Unremarkable Lateral meniscus:  Unremarkable LIGAMENTS Cruciates:  Unremarkable Collaterals:  Unremarkable CARTILAGE Patellofemoral: Focal chondral defect inferiorly along the lateral patellar facet for example on images 7-8 series 9 with faint endosteal edema at this site. Mild degenerative chondral thinning in the femoral trochlear groove. Medial:  Mild degenerative chondral thinning. Lateral:  Unremarkable Joint:  Trace knee effusion. Popliteal Fossa: Low-level edema in the  distal biceps femoris musculature and distal vastus musculature. Extensor Mechanism:  Unremarkable Bones: Chronic avascular necrosis in both femoral condyles and in the medial and lateral tibial plateau, without flattening or surrounding marrow edema. Other: Extensive subcutaneous edema circumferentially around the knee. IMPRESSION: 1. Extensive circumferential subcutaneous edema around the knee. 2. The focal irregular chondral defect inferiorly along the lateral patellar facet with associated faint endosteal edema. I do not see a definite free chondral fragment loose in the joint although there is a trace knee joint effusion. Mild degenerative chondral thinning in the femoral trochlear groove and medial compartment. 3. Low-level edema in the distal biceps femoris musculature and distal vastus musculature, without obvious abnormal enhancement. This may reflect lymphedema or venous stasis, myositis is not totally excluded. No drainable abscess. No findings of septic joint. 4. Multiple foci of chronic avascular necrosis in the femoral condyles and tibial plateau. No surrounding edema to suggest active infarct. Electronically Signed   By: Gaylyn Rong M.D.   On: 06/01/2016 17:05        Scheduled Meds: . B-complex  with vitamin C  1 tablet Oral Daily  . calcium citrate-vitamin D  1 tablet Oral TID  . dextrose  50 mL Intravenous Once  . enoxaparin (LOVENOX) injection  40 mg Subcutaneous Q24H  . levothyroxine  137 mcg Oral QAC breakfast  . magnesium sulfate 1 - 4 g bolus IVPB  2 g Intravenous Once  . multivitamin with minerals  1 tablet Oral BID  . nortriptyline  75 mg Oral QHS  . omega-3 acid ethyl esters  1 g Oral Daily  . pantoprazole  40 mg Oral BID AC  . potassium chloride  40 mEq Oral Once  . potassium chloride  40 mEq Oral Once  . potassium chloride SA  40 mEq Oral TID  . pregabalin  225 mg Oral BID  . protein supplement shake  11 oz Oral BID  . protein supplement  8 oz Oral BID PC  .  sucralfate  1 g Oral QID  . thiamine  100 mg Oral Daily  . topiramate  50 mg Oral QHS   Continuous Infusions: . dextrose 5 % and 0.9% NaCl 125 mL/hr (06/01/16 1731)     LOS: 0 days     Pershing Skidmore, MD, FACP, FHM. [facility] Pager [phone]  If 7PM-7AM, please contact night-coverage [email or web address] Password [email or web address] 06/02/2016, 1:58 PM

## 2016-06-02 NOTE — Progress Notes (Signed)
Critical lab potassium of 2.5. NP paged with new order of increased oral potassium TID.

## 2016-06-02 NOTE — Evaluation (Signed)
Physical Therapy Evaluation Patient Details Name: Dorothy PeersCrystal Laflam MRN: [id number] DOB: 06/27/1975 Today's Date: 06/02/2016   History of Present Illness  Dorothy Dyer is a 41 y.o. female with medical history significant for morbid obesity status post gastric bypass surgery chronic bilateral lower extremity edema, right radial nerve palsy with outpatient neurological evaluation in progress, history of avascular necrosis of the hip, iron deficiency anemia post bypass surgery, hypertension, marijuana abuse.  Admitted due to weakness, falls, overdose on prescription pain medication  Clinical Impression  Patient presents with decreased mobility due to pain and limited tolerance with weight L LE.  She has had falls at home and reports difficulty negotiating steps to enter home.  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  She now has respite care for her son and is scheduled to see orthopedist on 4/3 for presurgical planning for L THA.  She will benefit from follow up HHPT and HH aide and 3:1 for safe shower transfers.  Will benefit from skilled PT in the acute setting until d/c home.      Follow Up Recommendations Home health PT [facility](HH aide)    Equipment Recommendations  3in1 (PT)    Recommendations for Other Services       Precautions / Restrictions Precautions Precautions: Fall      Mobility  Bed Mobility Overal bed mobility: Modified Independent             General bed mobility comments: with increased time and pain, educated how to use device to assist leg off bed  Transfers Overall transfer level: Modified independent Equipment used: Rolling walker (2 wheeled) Transfers: Sit to/from Stand           General transfer comment: stands unaided with UE support  Ambulation/Gait Ambulation/Gait assistance: Modified independent (Device/Increase time) Ambulation Distance (Feet): 20 Feet Assistive device: Rolling walker (2  wheeled) Gait Pattern/deviations: Step-to pattern;Antalgic;Decreased stance time - left;Decreased step length - right     General Gait Details: heavy UE reliance for pain relief on L  Stairs Stairs:  (demonstrated stairs with rail and crutch)          Wheelchair Mobility    Modified Rankin (Stroke Patients Only)       Balance Overall balance assessment: Needs assistance   Sitting balance-Leahy Scale: Good     Standing balance support: Bilateral upper extremity supported Standing balance-Leahy Scale: Poor Standing balance comment: UE support for balance, did wash hands at sink with weight shifted R and leaning on counter                             Pertinent Vitals/Pain Pain Assessment: Faces Faces Pain Scale: Hurts whole lot Pain Location: L hip with mobility Pain Descriptors / Indicators: Discomfort;Guarding;Grimacing Pain Intervention(s): Monitored during session;Limited activity within patient's tolerance    Home Living Family/patient expects to be discharged to:: Private residence Living Arrangements: Children Available Help at Discharge: Family;Available PRN/intermittently Type of Home: Mobile home Home Access: Stairs to enter Entrance Stairs-Rails: Right Entrance Stairs-Number of Steps: 4 Home Layout: One level Home Equipment: Walker - 4 wheels;Cane - single point      Prior Function Level of Independence: Independent with assistive device(s)         Comments: lives with 657 y/o autistic son, brother and sister in law help with groceries     Hand Dominance        Extremity/Trunk Assessment  Upper Extremity Assessment Upper Extremity Assessment: RUE deficits/detail RUE Deficits / Details: radial nerve palsy with weak grip and difficulty opening hand RUE Sensation: decreased light touch    Lower Extremity Assessment Lower Extremity Assessment: LLE deficits/detail;RLE deficits/detail RLE Deficits / Details: edema in LE's L>R,  lifts R antigravity, not formally tested, errythema in lower half of lower legs LLE Deficits / Details: cannot lift antigravity, severe pain with full weight bearing    Cervical / Trunk Assessment Cervical / Trunk Assessment: Kyphotic  Communication   Communication: No difficulties  Cognition Arousal/Alertness: Awake/alert Behavior During Therapy: WFL for tasks assessed/performed Overall Cognitive Status: Within Functional Limits for tasks assessed                                        General Comments General comments (skin integrity, edema, etc.): obtained sock aid and leg lifter for pt. and educated in use; made elbow protector for pt to pad R elbow due to radial nerve palsy    Exercises     Assessment/Plan    PT Assessment Patient needs continued PT services  PT Problem List Decreased balance;Pain;Decreased activity tolerance;Decreased knowledge of use of DME;Decreased mobility       PT Treatment Interventions Stair training;Gait training;Therapeutic exercise    PT Goals (Current goals can be found in the Care Plan section)  Acute Rehab PT Goals Patient Stated Goal: To get hip surgery PT Goal Formulation: With patient Time For Goal Achievement: 06/05/16 Potential to Achieve Goals: Good    Frequency Min 3X/week   Barriers to discharge        Co-evaluation               End of Session Equipment Utilized During Treatment: Gait belt Activity Tolerance: Patient tolerated treatment well Patient left: in chair   PT Visit Diagnosis: Difficulty in walking, not elsewhere classified (R26.2);Pain Pain - Right/Left: Left Pain - part of body: Hip    Time: 1610-9604 PT Time Calculation (min) (ACUTE ONLY): 39 min   Charges:   PT Evaluation $PT Eval Moderate Complexity: 1 Procedure PT Treatments $Gait Training: 8-22 mins $Self Care/Home Management: 8-22   PT G Codes:   PT G-Codes **NOT FOR INPATIENT CLASS** Functional Assessment Tool Used:  Clinical judgement Functional Limitation: Mobility: Walking and moving around Mobility: Walking and Moving Around Current Status (V4098): At least 40 percent but less than 60 percent impaired, limited or restricted Mobility: Walking and Moving Around Goal Status [id number]): At least 20 percent but less than 40 percent impaired, limited or restricted    [place], [place] [phone] 06/02/2016   Elray Mcgregor 06/02/2016, 6:08 PM

## 2016-06-02 NOTE — Progress Notes (Addendum)
Initial Nutrition Assessment  DOCUMENTATION CODES:   Severe malnutrition in context of chronic illness  INTERVENTION:  Recommend providing 500 mg of Calcium Citrate with Vitamin D3 TID Recommend providing Centrum chewable Multivitamin BID for one month and once daily thereafter Recommend providing Super-B Complex vitamin once daily for one month Recommend providing additional 100 mg of thiamine for 5 days Recommend providing daily Fish Oil/Omega-3 supplement daily  Provide Premier Protein BID, each supplement provides 160 kcal and 30 grams of protein Provide Unjury (Chicken Soup) BID, each supplement provides 120 kcal and 20 grams of protein    NUTRITION DIAGNOSIS:   Malnutrition related to altered GI function, poor appetite as evidenced by severe fluid accumulation, energy intake < 75% for > or equal to 1 month.   GOAL:   Patient will meet greater than or equal to 90% of their needs   MONITOR:   PO intake, Supplement acceptance, Labs, Weight trends, Skin, I & O's  REASON FOR ASSESSMENT:   Consult Poor PO  ASSESSMENT:   41 y.o. 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  Pt reports having a poor appetite, nausea, abdominal pain, reflux and flatulence for several years. She reports that she usually drinks water during the day and eats a small meal at night. It usually takes her an hour to eat about half a sandwich at night which is a normal-sized dinner for her. She states that some days she is able to eat a few bites of food every few hours during the day. She states that she stopped taking her vitamin and mineral supplements about 3 weeks after  her gastric bypass surgery in 2003. She was drinking protein shakes for several months after. Pt reports feeling hungry this morning because she didn't eat anything yesterday; she ate a piece of white toast and less than half of the scrambled eggs and drank some coke.   She has tried drinking Ensure Shakes in the past, but she doesn't like them and feels that they cause her GI distress.   Pt appears malnourished on exam. She has several signs of possible vitamin/mineral deficiencies. Dry peeling skin on hands and scalp, dry brittle hair, spoon shaped nails, severe edema in legs, and purplish/magenta tongue.  ? Beri Beri and thiamine deficiency. Suspect severe deficiencies of thiamine, iron, zinc, protein, vitamin B6 and essential fatty acids.   Discussed concerns for nutrient deficiencies with patient and reviewed supplement recommendations as written above. Reviewed when and how would be best to take supplements. Discussed protein needs. Pt agreeable to trying Unjury and [employer] Protein supplements.   Diet Order:  Diet regular Room service appropriate? Yes; Fluid consistency: Thin  Skin:  Reviewed, no issues (cellulitis of leg)  Last BM:  PTA  Height:   Ht Readings from Last 1 Encounters:  06/01/16 5' 1.5" (1.562 m)    Weight:   Wt Readings from Last 1 Encounters:  06/01/16 180 lb (81.6 kg)    Ideal Body Weight:  48.9 kg  BMI:  Body mass index is 33.46 kg/m.  Estimated Nutritional Needs:   Kcal:  1800-2000  Protein:  90-105 grams  Fluid:  1.8-2 L/day  EDUCATION NEEDS:   Education needs addressed  Carlous Olivares RD, LDN, CSP  Inpatient Clinical Dietitian Pager: [phone] After Hours Pager: [phone]

## 2016-06-03 DIAGNOSIS — R600 Localized edema: Secondary | ICD-10-CM | POA: Diagnosis not present

## 2016-06-03 DIAGNOSIS — M25552 Pain in left hip: Secondary | ICD-10-CM | POA: Diagnosis not present

## 2016-06-03 DIAGNOSIS — Z9889 Other specified postprocedural states: Secondary | ICD-10-CM | POA: Diagnosis not present

## 2016-06-03 DIAGNOSIS — E43 Unspecified severe protein-calorie malnutrition: Secondary | ICD-10-CM | POA: Diagnosis not present

## 2016-06-03 DIAGNOSIS — G9340 Encephalopathy, unspecified: Secondary | ICD-10-CM | POA: Diagnosis not present

## 2016-06-03 DIAGNOSIS — E876 Hypokalemia: Secondary | ICD-10-CM | POA: Diagnosis not present

## 2016-06-03 DIAGNOSIS — R109 Unspecified abdominal pain: Secondary | ICD-10-CM | POA: Diagnosis not present

## 2016-06-03 DIAGNOSIS — E162 Hypoglycemia, unspecified: Secondary | ICD-10-CM | POA: Diagnosis not present

## 2016-06-03 DIAGNOSIS — G8929 Other chronic pain: Secondary | ICD-10-CM | POA: Diagnosis not present

## 2016-06-03 LAB — CBC
HEMATOCRIT: 27.6 % — AB (ref 36.0–46.0)
Hemoglobin: 9.1 g/dL — ABNORMAL LOW (ref 12.0–15.0)
MCH: 32.2 pg (ref 26.0–34.0)
MCHC: 33 g/dL (ref 30.0–36.0)
MCV: 97.5 fL (ref 78.0–100.0)
Platelets: 122 10*3/uL — ABNORMAL LOW (ref 150–400)
RBC: 2.83 MIL/uL — ABNORMAL LOW (ref 3.87–5.11)
RDW: 14.6 % (ref 11.5–15.5)
WBC: 3.7 10*3/uL — ABNORMAL LOW (ref 4.0–10.5)

## 2016-06-03 LAB — URINE CULTURE: Culture: >=100000 — AB

## 2016-06-03 LAB — BASIC METABOLIC PANEL
Anion gap: 4 — ABNORMAL LOW (ref 5–15)
BUN: 6 mg/dL (ref 6–20)
CHLORIDE: 109 mmol/L (ref 101–111)
CO2: 27 mmol/L (ref 22–32)
Calcium: 7.8 mg/dL — ABNORMAL LOW (ref 8.9–10.3)
Creatinine, Ser: 0.58 mg/dL (ref 0.44–1.00)
GFR calc Af Amer: >60 mL/min (ref >60)
GFR calc non Af Amer: >60 mL/min (ref >60)
GLUCOSE: 81 mg/dL (ref 65–99)
POTASSIUM: 3.7 mmol/L (ref 3.5–5.1)
Sodium: 140 mmol/L (ref 135–145)

## 2016-06-03 LAB — GLUCOSE, CAPILLARY
GLUCOSE-CAPILLARY: 73 mg/dL (ref 65–99)
GLUCOSE-CAPILLARY: 96 mg/dL (ref 65–99)
Glucose-Capillary: 82 mg/dL (ref 65–99)
Glucose-Capillary: 85 mg/dL (ref 65–99)

## 2016-06-03 LAB — PROCALCITONIN: PROCALCITONIN: 0.79 ng/mL

## 2016-06-03 LAB — MAGNESIUM
MAGNESIUM: 1.4 mg/dL — AB (ref 1.7–2.4)
Magnesium: 2.4 mg/dL (ref 1.7–2.4)

## 2016-06-03 MED ORDER — CALCIUM CARBONATE-VITAMIN D 500-200 MG-UNIT PO TABS: 2.0000 | ORAL_TABLET | Freq: Every day | ORAL | 0 refills | Status: AC

## 2016-06-03 MED ORDER — B COMPLEX-C PO TABS: 1.0000 | ORAL_TABLET | Freq: Every day | ORAL | 0 refills | Status: AC

## 2016-06-03 MED ORDER — TRAMADOL HCL 50 MG PO TABS: 50.0000 mg | ORAL_TABLET | Freq: Four times a day (QID) | ORAL | 0 refills | Status: DC | PRN

## 2016-06-03 MED ORDER — ACETAMINOPHEN 325 MG PO TABS: 650.0000 mg | ORAL_TABLET | Freq: Four times a day (QID) | ORAL | Status: DC | PRN

## 2016-06-03 MED ORDER — ADULT MULTIVITAMIN W/MINERALS CH: 1.0000 | ORAL_TABLET | Freq: Two times a day (BID) | ORAL | Status: AC

## 2016-06-03 MED ORDER — UNJURY CHICKEN SOUP POWDER: 8.0000 [oz_av] | Freq: Two times a day (BID) | ORAL | Status: AC

## 2016-06-03 MED ORDER — THIAMINE HCL 100 MG PO TABS: 100.0000 mg | ORAL_TABLET | Freq: Every day | ORAL | 0 refills | Status: AC

## 2016-06-03 MED ORDER — POTASSIUM CHLORIDE CRYS ER 20 MEQ PO TBCR: 20.0000 meq | EXTENDED_RELEASE_TABLET | Freq: Two times a day (BID) | ORAL | 0 refills | Status: AC | PRN

## 2016-06-03 MED ORDER — MAGNESIUM SULFATE 4 GM/100ML IV SOLN
4.0000 g | Freq: Once | INTRAVENOUS | Status: AC
  Administered 2016-06-03: 4 g via INTRAVENOUS
  Filled 2016-06-03: qty 100

## 2016-06-03 MED ORDER — OMEGA-3-ACID ETHYL ESTERS 1 G PO CAPS: 1.0000 g | ORAL_CAPSULE | Freq: Every day | ORAL | 0 refills | Status: AC

## 2016-06-03 NOTE — Discharge Summary (Addendum)
Physician Discharge Summary  Dorothy Dyer GUY:[id number] DOB: 01/26/1976  PCP: Leo Grosser, MD  Admit date: 06/01/2016 Discharge date: 06/03/2016  Recommendations for Outpatient Follow-up:  1. Dr. Gilmore Laroche, PCP in 3 days with repeat labs (CBC & CMP). She does have a prior appointment on 06/06/16 at 10:15 AM.Please follow final blood culture results that were sent from the hospital. 2. Dr. Loreli Dollar, Orthopedics: Patient is advised to call their office on 06/05/16 for a follow-up appointment.  Home Health: PT, home health aide Equipment/Devices: Rolling walker. 3n1   Discharge Condition: Improved and stable.  CODE STATUS: Full.  Diet recommendation: Heart healthy diet.  Discharge Diagnoses:  Principal Problem:   Acute encephalopathy Active Problems:   Hypertension   H/O gastric bypass   Chronic abdominal pain   Iron deficiency anemia   Acute hypokalemia   Hypoglycemia   Marijuana abuse   Chronic left hip pain   Overdose of opiate or related narcotic, accidental or unintentional, initial encounter   Lower extremity cellulitis   Radial nerve palsy, right   Left knee pain   Bilateral lower extremity edema   Protein-calorie malnutrition, severe   Brief Summary: 41 year old female, moved from [place] to [place] approximately 2 years ago, PMH of remote gastric bypass surgery in 2000, chronic abdominal pain (has appointment to be seen at [facility]), chronic bilateral lower extremity edema, right radial nerve palsy with outpatient neurology evaluation in progress, avascular necrosis of left hip (awaiting total hip arthroplasty pending dental, medical clearance and smoking cessation. Dr.Swintek, Ortho), iron deficiency anemia, HTN, THC abuse, recently seen by PCP and Lasix dose adjusted for edema, given Vicodin 10/325 for pain, presented to ED with altered mental status. EMS was called to patient's home by a relative who was going to take patient to her orthopedic  appointment on 06/01/16, due to difficulty awakening patient and several falls over the past 2 weeks. Patient apparently was sleeping on the floor because she was too weak to get up. Review of her home medications revealed that she had been taking more than prescribed narcotics.   Assessment & Plan:   1. Suspected opioid overdose, unintentional, with mental status changes: Patient had been using Tylenol and even Aleve despite her history of gastric bypass for pain management until recent prescription of Vicodin by PCP. I discussed with patient's orthopedic M.D. who recommended Tylenol and Ultram for pain management. He does not recommend other opioids or lidocaine patch which does not usually work well. Mental status changes have resolved. CT head negative. I discussed multiple times with patient that she should not use more than prescribed amount of any medications, especially sedative medications including opioids, benzodiazepines. I reviewed the [place] controlled substance database with the assistance of a pharmacist and noted recent prescription for Vicodin and Lyrica. As per orthopedic recommendations, prescribed her a very short supply of Ultram until she sees her outpatient PCP. She gave permission to dispose off the remainder of her narcotics that were in the hospital. I requested patient's RN to coordinate with the pharmacy to dispose her narcotics. 2. Hypokalemia/hypomagnesemia:  replaced aggressively. Close outpatient follow-up. Likely multifactorial secondary to poor oral intake, gastric bypass history and diuretics. 3. Chronic bilateral lower extremity edema: Multifactorial but probably related to nutritional/hypoalbuminemia from gastric bypass surgery and mal absorption and lymphedema versus venous stasis. 2-D echo 02/2016 with preserved LV function. Continue cautious use of diuretics. No clinical features suggestive of infection/cellulitis. Empirically started IV vancomycin and Zosyn  were discontinued. Low index of  suspicion for DVT but performed lower extremity venous Dopplers which were negative for DVT. 4. Left hip and knee pain, chronic and worsening/avascular necrosis of left hip: MRI of the left hip and left knee reviewed with patient's primary orthopedic M.D. who looked at these images and indicated that patient needs to follow up with him as outpatient to plan for surgery. In the interim he recommended PT evaluation, weightbearing as tolerated, walker and pain management as above. No acute intervention in the hospital recommended.  5. Status post gastric bypass surgery and severe malnutrition in the context of chronic illness: Dietitian consultation appreciated and discussed with her. Continue supplements as recommended. 6. Hypoglycemia: Suspect secondary to poor oral intake. Initiated regular diet (patient states that she was taking regular diet at home and refused modified diet). No further hypoglycemic episodes. 7. Essential hypertension: Soft blood pressures. Toprol was discontinued during this admission. May consider initiating getting outpatient follow-up if blood pressures start to rise. 8. Right radial nerve palsy: Outpatient follow-up. 9. Chronic abdominal pain: States that she has had this since age 19 and preceding her bariatric surgery and has undergone extensive evaluation and is supposed to follow up with [facility]. 10. Hypothyroid: Continue Synthroid. TSH: 1.454. 11. Iron deficiency anemia/mild thrombocytopenia: Hemoglobin stable. Has pancytopenia of unclear etiology. Close outpatient follow-up. 12. THC abuse: Cessation counseled. 13. Prolonged QTC: Resolved on repeat EKG 3/23. Replaced potassium and magnesium. 14. Asymptomatic bacteriuria:      Consultants:  Discussed with orthopedics   Procedures:  None    Discharge Instructions  Discharge Instructions    Call MD for:    Complete by:  As directed    Confusion.   Call MD for:   difficulty breathing, headache or visual disturbances    Complete by:  As directed    Call MD for:  extreme fatigue    Complete by:  As directed    Call MD for:  persistant dizziness or light-headedness    Complete by:  As directed    Call MD for:  severe uncontrolled pain    Complete by:  As directed    Diet - low sodium heart healthy    Complete by:  As directed    Increase activity slowly    Complete by:  As directed        Medication List    STOP taking these medications   BELBUCA 150 MCG Film Generic drug:  Buprenorphine HCl   EMBEDA 30-1.2 MG Cpcr Generic drug:  Morphine-Naltrexone   feeding supplement (ENSURE ENLIVE) Liqd   HYDROcodone-acetaminophen 10-325 MG tablet Commonly known as:  NORCO   metolazone 5 MG tablet Commonly known as:  ZAROXOLYN   metoprolol succinate 50 MG 24 hr tablet Commonly known as:  TOPROL-XL   mometasone 0.1 % cream Commonly known as:  ELOCON   ondansetron 4 MG tablet Commonly known as:  ZOFRAN   rizatriptan 10 MG tablet Commonly known as:  MAXALT   simethicone 80 MG chewable tablet Commonly known as:  MYLICON   sucralfate 1 g tablet Commonly known as:  CARAFATE     TAKE these medications   acetaminophen 325 MG tablet Commonly known as:  TYLENOL Take 2 tablets (650 mg total) by mouth every 6 (six) hours as needed for mild pain (or Fever >/= 101).   ACIPHEX 20 MG tablet Generic drug:  RABEprazole TAKE 1 TABLET BY MOUTH TWICE A DAY What changed:  See the new instructions.   ALPRAZolam 0.5 MG tablet Commonly known  as:  XANAX TAKE 1 TABLET BY MOUTH TWICE A DAY AS NEEDED   B-complex with vitamin C tablet Take 1 tablet by mouth daily. Start taking on:  06/04/2016   calcium-vitamin D 500-200 MG-UNIT tablet Commonly known as:  OSCAL WITH D Take 2 tablets by mouth daily with breakfast. Start taking on:  06/04/2016   furosemide 40 MG tablet Commonly known as:  LASIX Take 1 tablet (40 mg total) by mouth daily as  needed. What changed:  reasons to take this   levothyroxine 125 MCG tablet Commonly known as:  SYNTHROID, LEVOTHROID TAKE 1 TABLET EVERY DAY What changed:  See the new instructions.   multivitamin with minerals Tabs tablet Take 1 tablet by mouth 2 (two) times daily. Start taking on:  06/04/2016   nortriptyline 75 MG capsule Commonly known as:  PAMELOR TAKE ONE CAPSULE BY MOUTH EVERY EVENING AT BEDTIME   omega-3 acid ethyl esters 1 g capsule Commonly known as:  LOVAZA Take 1 capsule (1 g total) by mouth daily. Start taking on:  06/04/2016   potassium chloride SA 20 MEQ tablet Commonly known as:  K-DUR,KLOR-CON Take 1 tablet (20 mEq total) by mouth 2 (two) times daily as needed (when taking lasix).   pregabalin 225 MG capsule Commonly known as:  LYRICA TAKE 225mg  CAPSULE BY MOUTH TWICE A DAY   protein supplement Powd Commonly known as:  UNJURY CHICKEN SOUP Take 27 g (8 oz total) by mouth 2 (two) times daily after a meal. Start taking on:  06/04/2016   thiamine 100 MG tablet Take 1 tablet (100 mg total) by mouth daily. Start taking on:  06/04/2016   topiramate 50 MG tablet Commonly known as:  TOPAMAX TAKE 1 TABLET BY MOUTH AT BEDTIME What changed:  See the new instructions.   traMADol 50 MG tablet Commonly known as:  ULTRAM Take 1 tablet (50 mg total) by mouth every 6 (six) hours as needed for moderate pain or severe pain.      Follow-up Information    PICKARD,WARREN TOM, MD. Schedule an appointment as soon as possible for a visit in 3 day(s).   Specialty:  Family Medicine Why:  To be seen with repeat labs (CBC & CMP). Contact information: [address] [place] [address] [phone]        Swinteck, Cloyde Reams, MD. Schedule an appointment as soon as possible for a visit.   Specialty:  Orthopedic Surgery Why:  Call office on Monday 06/05/16 for follow-up appointment. Contact information: [address] [place]  [address] [phone]          Allergies  Allergen Reactions  . Ibuprofen Nausea Only and Other (See Comments)    Bad stomach pains  . Toradol [Ketorolac Tromethamine] Anxiety  . Aspirin Hives  . Cleocin [Clindamycin Hcl] Itching and Rash      Procedures/Studies: Dg Chest 2 View  Result Date: 06/01/2016 CLINICAL DATA:  Hypertension.  Evaluate for pneumonia. EXAM: CHEST  2 VIEW COMPARISON:  06/05/2014 FINDINGS: Patient slightly rotated to the left. Lungs are adequately inflated without airspace consolidation or effusion. Cardiomediastinal silhouette is within normal. Remainder of the exam is unchanged. IMPRESSION: No active cardiopulmonary disease. Electronically Signed   By: Elberta Fortis M.D.   On: 06/01/2016 18:16   Ct Head Wo Contrast  Result Date: 06/01/2016 CLINICAL DATA:  41 year old female with history of weakness all over for 2 weeks. Multiple falls in the past 2 weeks. EXAM: CT HEAD WITHOUT CONTRAST  TECHNIQUE: Contiguous axial images were obtained from the base of the skull through the vertex without intravenous contrast. COMPARISON:  Head CT 02/10/2016. FINDINGS: Brain: No evidence of acute infarction, hemorrhage, hydrocephalus, extra-axial collection or mass lesion/mass effect. Vascular: No hyperdense vessel or unexpected calcification. Skull: Normal. Negative for fracture or focal lesion. Sinuses/Orbits: No acute finding. Other: None. IMPRESSION: 1. No acute intracranial abnormalities. 2. Normal appearance of the brain. Electronically Signed   By: Trudie Reedaniel  Entrikin M.D.   On: 06/01/2016 11:59   Mr Hip Left W Wo Contrast  Result Date: 06/01/2016 CLINICAL DATA:  Chronic left hip pain.  IV drug abuse. EXAM: MRI OF THE LEFT HIP WITHOUT AND WITH CONTRAST TECHNIQUE: Multiplanar, multisequence MR imaging was performed both before and after administration of intravenous contrast. CONTRAST:  20 cc MultiHance COMPARISON:  Radiographs from 05/25/2016 FINDINGS: Bones: Chronic avascular  necrosis the left femoral head with collapse and some fragmentation of essentially the entire femoral head. The junction of the femoral head and neck now pseudoarticulates with the lateral margin of the acetabulum and there 1.5 cm proximal migration of the left femur along with 2.2 cm lateral displacement of the femur with respect to the acetabulum. Joint effusion with synovitis extending around the pseudoarticulation. As expected there is some subcortical edema and low-level subcortical enhancement along the pseudoarticulation. There is mild degenerative arthropathy of the right hip. Articular cartilage and labrum Articular cartilage: The articular cartilages essentially destroyed on the left. Labrum: Likely severely torn given the pseudoarticulation of the acetabular rim with the irregular neck of the femur. Joint or bursal effusion Joint effusion: Moderate to large joint effusion on the left with synovitis. Bursae:  No significant regional bursitis. Muscles and tendons Muscles and tendons: Abnormal muscle edema in the left gluteus musculature and also in the right gluteus minimus. Abnormal edema tracks along the deep margin of the iliotibial band without significant enhancement. Edema in the left hip adductor musculature. Other findings Miscellaneous: Subcutaneous edema lateral to the hips and tracking down the left lateral thigh. IMPRESSION: 1. Chronic avascular necrosis of the left femoral head with collapse and some fragmentation of essentially the entire femoral head. The femur is displaced 2.2 cm lateral and 1.5 cm proximal, with the neck of the femur now pseudo articulating with the edge of the acetabulum. Surrounding moderate effusion with extensive synovitis. Septic joint not entirely excluded but certainly the appearance of synovitis would be readily explainable by the noninfectious findings. 2. Edema in regional musculature of the left hip including the gluteus musculature and left hip adductor  musculature. There is also fluid tracking deep to the iliotibial band in such a fashion that fasciitis is not excluded. No visible gas in the soft tissues. Electronically Signed   By: Gaylyn RongWalter  Liebkemann M.D.   On: 06/01/2016 17:22   Mr Knee Left W Wo Contrast  Result Date: 06/01/2016 CLINICAL DATA:  Swelling of the knee and acute knee pain. IV drug abuse. EXAM: MRI OF THE LEFT KNEE WITHOUT AND WITH CONTRAST TECHNIQUE: Multiplanar, multisequence MR imaging of the knee was performed before and after the administration of intravenous contrast. CONTRAST:  20mL MULTIHANCE GADOBENATE DIMEGLUMINE 529 MG/ML IV SOLN COMPARISON:  05/25/2016 conventional radiographs FINDINGS: Despite efforts by the technologist and patient, motion artifact is present on today's exam and could not be eliminated. This reduces exam sensitivity and specificity. MENISCI Medial meniscus:  Unremarkable Lateral meniscus:  Unremarkable LIGAMENTS Cruciates:  Unremarkable Collaterals:  Unremarkable CARTILAGE Patellofemoral: Focal chondral defect inferiorly along the lateral patellar  facet for example on images 7-8 series 9 with faint endosteal edema at this site. Mild degenerative chondral thinning in the femoral trochlear groove. Medial:  Mild degenerative chondral thinning. Lateral:  Unremarkable Joint:  Trace knee effusion. Popliteal Fossa: Low-level edema in the distal biceps femoris musculature and distal vastus musculature. Extensor Mechanism:  Unremarkable Bones: Chronic avascular necrosis in both femoral condyles and in the medial and lateral tibial plateau, without flattening or surrounding marrow edema. Other: Extensive subcutaneous edema circumferentially around the knee. IMPRESSION: 1. Extensive circumferential subcutaneous edema around the knee. 2. The focal irregular chondral defect inferiorly along the lateral patellar facet with associated faint endosteal edema. I do not see a definite free chondral fragment loose in the joint  although there is a trace knee joint effusion. Mild degenerative chondral thinning in the femoral trochlear groove and medial compartment. 3. Low-level edema in the distal biceps femoris musculature and distal vastus musculature, without obvious abnormal enhancement. This may reflect lymphedema or venous stasis, myositis is not totally excluded. No drainable abscess. No findings of septic joint. 4. Multiple foci of chronic avascular necrosis in the femoral condyles and tibial plateau. No surrounding edema to suggest active infarct. Electronically Signed   By: Gaylyn Rong M.D.   On: 06/01/2016 17:05   Dg Knee Complete 4 Views Left  Result Date: 05/25/2016 CLINICAL DATA:  Left knee pain for several days, no known injury, initial encounter EXAM: LEFT KNEE - COMPLETE 4+ VIEW COMPARISON:  01/13/2016 FINDINGS: No acute fracture or dislocation is noted. Mild mottled density is noted within the bones which may simply represent some osteopenia. No gross soft tissue abnormality is seen. IMPRESSION: No acute bony abnormality is noted. A somewhat mottled appearance to the bones is noted of uncertain significance. This may simply be related to osteopenia. Electronically Signed   By: Alcide Clever M.D.   On: 05/25/2016 13:21   Dg Hip Unilat With Pelvis 2-3 Views Left  Result Date: 05/25/2016 CLINICAL DATA:  Left hip pain for several days, no known injury, history of AVN EXAM: DG HIP (WITH OR WITHOUT PELVIS) 2-3V LEFT COMPARISON:  12/01/2015 FINDINGS: There has been progressive destruction of the left femoral head with some fragmentation within the hip joints as well as the femoral head now articulating with the superolateral aspect of the acetabulum which also demonstrates some remodeling. No gross soft tissue abnormality is noted. No acute fracture is seen. IMPRESSION: Progressive destruction of the left femoral head consistent with the given clinical history of AVN. The femoral head now articulates primarily with  the superolateral aspect of the acetabulum. Electronically Signed   By: Alcide Clever M.D.   On: 05/25/2016 13:29      Subjective:  States that she is feeling better. Pain seems to be controlled. Appetite improved. Denies any other complaints. Specifically denied dysuria or urinary frequency, fever or chills. Patient was seen along with her female RN in the room.   Discharge Exam:  Vitals:   06/03/16 0600 06/03/16 0956 06/03/16 1355 06/03/16 1732  BP: 98/61 98/62 110/68 110/68  Pulse: 91 95 91 100  Resp:  20 20 20   Temp:  97.5 F (36.4 C) 97.4 F (36.3 C) 98.2 F (36.8 C)  TempSrc:  Oral Oral Oral  SpO2:  99% 99% 100%  Weight:      Height:        General exam: Pleasant young female lying comfortably supine in bed. Respiratory system: Clear to auscultation. Respiratory effort normal. Cardiovascular system: S1 &  S2 heard, RRR. No JVD, murmurs, rubs, gallops or click. Bilateral leg chronic appearing edema without acute changes. Telemetry: SR-ST in the 100s.  Gastrointestinal system: Abdomen is nondistended, soft and nontender. No organomegaly or masses felt. Normal bowel sounds heard. Central nervous system: Alert and oriented. No focal neurological deficits. Extremities: Symmetric 5 x 5 power. Left lower extremity power assessment limited secondary to pain.  Skin: No rashes, lesions or ulcers. Chronic leg edema without acute changes.  Psychiatry: Judgement and insight appear normal. Mood & affect appropriate.     The results of significant diagnostics from this hospitalization (including imaging, microbiology, ancillary and laboratory) are listed below for reference.     Microbiology: Recent Results (from the past 240 hour(s))  Blood culture (routine x 2)     Status: None (Preliminary result)   Collection Time: 06/01/16 10:30 AM  Result Value Ref Range Status   Specimen Description BLOOD LEFT FOREARM  Final   Special Requests BOTTLES DRAWN AEROBIC AND ANAEROBIC  5CC  Final    Culture NO GROWTH 2 DAYS  Final   Report Status PENDING  Incomplete  Urine culture     Status: Abnormal   Collection Time: 06/01/16 10:39 AM  Result Value Ref Range Status   Specimen Description URINE, RANDOM  Final   Special Requests NONE  Final   Culture >=100,000 COLONIES/mL ESCHERICHIA COLI (A)  Final   Report Status 06/03/2016 FINAL  Final   Organism ID, Bacteria ESCHERICHIA COLI (A)  Final      Susceptibility   Escherichia coli - MIC*    AMPICILLIN >=32 RESISTANT Resistant     CEFAZOLIN >=64 RESISTANT Resistant     CEFTRIAXONE <=1 SENSITIVE Sensitive     CIPROFLOXACIN <=0.25 SENSITIVE Sensitive     GENTAMICIN <=1 SENSITIVE Sensitive     IMIPENEM <=0.25 SENSITIVE Sensitive     NITROFURANTOIN <=16 SENSITIVE Sensitive     TRIMETH/SULFA <=20 SENSITIVE Sensitive     AMPICILLIN/SULBACTAM >=32 RESISTANT Resistant     PIP/TAZO 8 SENSITIVE Sensitive     Extended ESBL NEGATIVE Sensitive     * >=100,000 COLONIES/mL ESCHERICHIA COLI  Blood culture (routine x 2)     Status: None (Preliminary result)   Collection Time: 06/01/16 10:55 AM  Result Value Ref Range Status   Specimen Description BLOOD RIGHT ANTECUBITAL  Final   Special Requests BOTTLES DRAWN AEROBIC AND ANAEROBIC  5CC  Final   Culture NO GROWTH 2 DAYS  Final   Report Status PENDING  Incomplete     Labs: CBC:  Recent Labs Lab 06/01/16 1049 06/02/16 0418 06/03/16 0653  WBC 3.8* 4.6 3.7*  NEUTROABS 1.3*  --   --   HGB 10.8* 9.9* 9.1*  HCT 31.8* 29.1* 27.6*  MCV 94.4 95.7 97.5  PLT 158 147* 122*   Basic Metabolic Panel:  Recent Labs Lab 06/01/16 1049 06/01/16 1900 06/02/16 0418 06/02/16 1753 06/03/16 0653 06/03/16 1616  NA 139  --  141 139 140  --   K 2.9*  --  2.5* 3.9 3.7  --   CL 100*  --  105 107 109  --   CO2 25  --  26 24 27   --   GLUCOSE 56*  --  83 100* 81  --   BUN 21*  --  10 6 6   --   CREATININE 0.97  --  0.71 0.66 0.58  --   CALCIUM 7.9*  --  7.3* 8.0* 7.8*  --   MG  --  1.6*  --  1.3*  1.4* 2.4  PHOS  --  3.0  --   --   --   --    Liver Function Tests:  Recent Labs Lab 06/01/16 1049 06/02/16 0418  AST 37 24  ALT 35 30  ALKPHOS 150* 124  BILITOT 1.2 1.0  PROT 5.6* 3.9*  ALBUMIN 1.9* 1.6*   Cardiac Enzymes:  Recent Labs Lab 06/01/16 1049 06/02/16 0418  CKTOTAL 350* 191   CBG:  Recent Labs Lab 06/02/16 2337 06/03/16 0427 06/03/16 0819 06/03/16 1156 06/03/16 1601  GLUCAP 95 82 73 85 96   Thyroid function studies  Recent Labs  06/01/16 1658  TSH 1.454   Anemia work up  Recent Labs  06/01/16 1658  VITAMINB12 446  FOLATE 10.2  FERRITIN 90  TIBC 183*  IRON 77  RETICCTPCT 1.4   Urinalysis    Component Value Date/Time   COLORURINE YELLOW 06/01/2016 1145   APPEARANCEUR CLEAR 06/01/2016 1145   LABSPEC 1.009 06/01/2016 1145   PHURINE 6.0 06/01/2016 1145   GLUCOSEU NEGATIVE 06/01/2016 1145   HGBUR NEGATIVE 06/01/2016 1145   BILIRUBINUR NEGATIVE 06/01/2016 1145   KETONESUR 5 (A) 06/01/2016 1145   PROTEINUR NEGATIVE 06/01/2016 1145   UROBILINOGEN 1.0 06/18/2014 1316   NITRITE POSITIVE (A) 06/01/2016 1145   LEUKOCYTESUR NEGATIVE 06/01/2016 1145      Time coordinating discharge: Over 30 minutes  SIGNED:  Marcellus Scott, MD, FACP, FHM. [facility] Pager [phone]  If 7PM-7AM, please contact night-coverage [email or web address] Password [facility] 06/03/2016, 5:41 PM

## 2016-06-03 NOTE — Progress Notes (Signed)
OT Cancellation Note and Discharge  Patient Details Name: Glenford PeersCrystal Amon MRN: [id number] DOB: 08/18/1975   Cancelled Treatment:    Reason Eval/Treat Not Completed: OT screened, no needs identified, will sign off. Spoke with RN and Pt who declined, PT has provided her with sock donner, leg lifter, and rec 3 in 1. OT provided red tubing to build up grips for utensils and objects like toothbrush.   Evern BioLaura J Annmarie Plemmons 06/03/2016, 4:30 PM  Sherryl MangesLaura Jael Kostick OTR/L [phone]

## 2016-06-03 NOTE — Discharge Instructions (Signed)
Nutrition Recommendations for Discharge: Take 500 mg of Calcium Citrate with Vitamin D3 TID Take Centrum chewable Multivitamin twice daily for one month and once daily after the first month Take Super-B Complex vitamin once daily for one month Take additional 100 mg of thiamine for 5 days Take a Fish Oil/Omega-3 supplement daily  Unjury (Chicken Soup) twice daily Premier Protein BID twice daily as needed  These supplements can be purchased at [employer] at [address], [facility]    Discharge instructions: Please get your medications reviewed and adjusted by your Primary MD.  Please request your Primary MD to go over all Hospital Tests and Procedure/Radiological results at the follow up, please get all Hospital records sent to your Prim MD by signing hospital release before you go home.  If you had Pneumonia of Lung problems at the Hospital: Please get a 2 view Chest X ray done in 6-8 weeks after hospital discharge or sooner if instructed by your Primary MD.  If you have Congestive Heart Failure: Please call your Cardiologist or Primary MD anytime you have any of the following symptoms:  1) 3 pound weight gain in 24 hours or 5 pounds in 1 week  2) shortness of breath, with or without a dry hacking cough  3) swelling in the hands, feet or stomach  4) if you have to sleep on extra pillows at night in order to breathe  Follow cardiac low salt diet and 1.5 lit/day fluid restriction.  If you have diabetes Accuchecks 4 times/day, Once in AM empty stomach and then before each meal. Log in all results and show them to your primary doctor at your next visit. If any glucose reading is under 80 or above 300 call your primary MD immediately.  If you have Seizure/Convulsions/Epilepsy: Please do not drive, operate heavy machinery, participate in activities at heights or participate in high speed sports until you have seen by Primary MD or a Neurologist and advised  to do so again.  If you had Gastrointestinal Bleeding: Please ask your Primary MD to check a complete blood count within one week of discharge or at your next visit. Your endoscopic/colonoscopic biopsies that are pending at the time of discharge, will also need to followed by your Primary MD.  Get Medicines reviewed and adjusted. Please take all your medications with you for your next visit with your Primary MD  Please request your Primary MD to go over all hospital tests and procedure/radiological results at the follow up, please ask your Primary MD to get all Hospital records sent to his/her office.  If you experience worsening of your admission symptoms, develop shortness of breath, life threatening emergency, suicidal or homicidal thoughts you must seek medical attention immediately by calling 911 or calling your MD immediately  if symptoms less severe.  You must read complete instructions/literature along with all the possible adverse reactions/side effects for all the Medicines you take and that have been prescribed to you. Take any new Medicines after you have completely understood and accpet all the possible adverse reactions/side effects.   Do not drive or operate heavy machinery when taking Pain medications.   Do not take more than prescribed Pain, Sleep and Anxiety Medications  Special Instructions: If you have smoked or chewed Tobacco  in the last 2 yrs please stop smoking, stop any regular Alcohol  and or any Recreational drug use.  Wear Seat belts while driving.  Please note You were cared for by a hospitalist  during your hospital stay. If you have any questions about your discharge medications or the care you received while you were in the hospital after you are discharged, you can call the unit and asked to speak with the hospitalist on call if the hospitalist that took care of you is not available. Once you are discharged, your primary care physician will handle any further  medical issues. Please note that NO REFILLS for any discharge medications will be authorized once you are discharged, as it is imperative that you return to your primary care physician (or establish a relationship with a primary care physician if you do not have one) for your aftercare needs so that they can reassess your need for medications and monitor your lab values.  You can reach the hospitalist office at phone [phone] or fax [phone]   If you do not have a primary care physician, you can call [phone] for a physician referral.

## 2016-06-03 NOTE — Care Management Note (Signed)
Case Management Note  Patient Details  Name: Dorothy PeersCrystal Dyer MRN: [id number] Date of Birth: 03/06/1976  Subjective/Objective:                 Spoke with patient at the bedside, she states she needs 3in1, order placed, [facility] to room prior to DC. [facility]. [facility] Spoke with Dr Waymon AmatoHongalgi, anticipate DC tonight pending return labs after mag infusion. Patient states family will provide ride home.    Action/Plan:   Expected Discharge Date:                  Expected Discharge Plan:  Home w Home Health Services  In-House Referral:     Discharge planning Services  CM Consult  Post Acute Care Choice:  Home Health Choice offered to:  Patient  DME Arranged:    DME Agency:     HH Arranged:  PT, Nurse's Aide HH Agency:  [facility]  Status of Service:  Completed, signed off  If discussed at Long Length of Stay Meetings, dates discussed:    Additional Comments:  Lawerance SabalDebbie Maurice Ramseur, RN 06/03/2016, 3:54 PM

## 2016-06-03 NOTE — Progress Notes (Signed)
Discharge paperwork gone over with patient. Prescriptions given. Patient's medications were retrieved from pharmacy. All narcotics recounted and verified initial amount. No questions at this time. Patient told to call out when ride comes.

## 2016-06-05 ENCOUNTER — Telehealth: Payer: Self-pay | Admitting: Family Medicine

## 2016-06-05 MED ORDER — RABEPRAZOLE SODIUM 20 MG PO TBEC: DELAYED_RELEASE_TABLET | ORAL | 5 refills | Status: DC

## 2016-06-05 NOTE — Telephone Encounter (Signed)
Patient requesting a refill on her aciphex she would like it called into [facility] on [address]. She isn't sure if it will require a prior auth.  CB# [phone]

## 2016-06-05 NOTE — Telephone Encounter (Signed)
Medication called/sent to requested pharmacy  

## 2016-06-06 ENCOUNTER — Telehealth: Payer: Self-pay | Admitting: Family Medicine

## 2016-06-06 ENCOUNTER — Ambulatory Visit: Payer: Medicare Other | Admitting: Family Medicine

## 2016-06-06 LAB — CULTURE, BLOOD (ROUTINE X 2)
Culture: NO GROWTH
Culture: NO GROWTH

## 2016-06-06 NOTE — Telephone Encounter (Signed)
This request has received a Cancelled outcome. This may mean your patient does not have active coverage with this plan, this authorization was processed as a duplicate request, or an authorization was not needed for this medication. Note any additional information provided by [employer] at the bottom of this request, and contact [employer] at [phone] for further details.

## 2016-06-06 NOTE — Telephone Encounter (Signed)
PA submitted through [email or web address]  - received the following:   Your information has been submitted to [employer]. [employer] will review the request and fax you a determination directly,typically within 3-5 business days from your submission. You may also check for the updated outcome later by reopening this request. If [employer] has not responded in 3-5 business days or if you have any questions about your PA submission, contact [employer] at [phone].

## 2016-06-07 MED ORDER — RABEPRAZOLE SODIUM 20 MG PO TBEC: DELAYED_RELEASE_TABLET | ORAL | 5 refills | Status: DC

## 2016-06-07 NOTE — Telephone Encounter (Signed)
Resent PA through [email or web address] and received the following:   Your PA has been faxed to the plan as a paper copy. Please contact the plan directly if you haven't received a determination in a typical timeframe.  You will be notified of the determination via fax.

## 2016-06-08 ENCOUNTER — Encounter: Payer: Self-pay | Admitting: Family Medicine

## 2016-06-08 ENCOUNTER — Ambulatory Visit (INDEPENDENT_AMBULATORY_CARE_PROVIDER_SITE_OTHER): Payer: Medicare HMO | Admitting: Family Medicine

## 2016-06-08 VITALS — BP 130/80 | HR 100 | Temp 97.9°F | Resp 16 | Ht 61.5 in | Wt 182.0 lb

## 2016-06-08 DIAGNOSIS — R69 Illness, unspecified: Secondary | ICD-10-CM | POA: Diagnosis not present

## 2016-06-08 DIAGNOSIS — F1190 Opioid use, unspecified, uncomplicated: Secondary | ICD-10-CM | POA: Diagnosis not present

## 2016-06-08 DIAGNOSIS — G8929 Other chronic pain: Secondary | ICD-10-CM

## 2016-06-08 DIAGNOSIS — R109 Unspecified abdominal pain: Secondary | ICD-10-CM | POA: Diagnosis not present

## 2016-06-08 DIAGNOSIS — M87052 Idiopathic aseptic necrosis of left femur: Secondary | ICD-10-CM

## 2016-06-08 DIAGNOSIS — R1084 Generalized abdominal pain: Secondary | ICD-10-CM

## 2016-06-08 NOTE — Progress Notes (Signed)
Subjective:    Patient ID: Dorothy Dyer, female    DOB: 12/11/1975, 41 y.o.   MRN: [id number]  HPI  Recently admitted to the hospital with altered mental status secondary to narcotic overdose. I've copied relevant portions of the discharge summary and included them below for my reference:  Admit date: 06/01/2016 Discharge date: 06/03/2016  Recommendations for Outpatient Follow-up:  1. Dr. Gilmore Larocheom Aydin Hink, PCP in 3 days with repeat labs (CBC & CMP). She does have a prior appointment on 06/06/16 at 10:15 AM.Please follow final blood culture results that were sent from the hospital. 2. Dr. Loreli DollarBrian Swintek, Orthopedics: Patient is advised to call their office on 06/05/16 for a follow-up appointment.  Home Health: PT, home health aide Equipment/Devices: Rolling walker. 3n1   Discharge Condition: Improved and stable.  CODE STATUS: Full.  Diet recommendation: Heart healthy diet.  Discharge Diagnoses:  Principal Problem:   Acute encephalopathy Active Problems:   Hypertension   H/O gastric bypass   Chronic abdominal pain   Iron deficiency anemia   Acute hypokalemia   Hypoglycemia   Marijuana abuse   Chronic left hip pain   Overdose of opiate or related narcotic, accidental or unintentional, initial encounter   Lower extremity cellulitis   Radial nerve palsy, right   Left knee pain   Bilateral lower extremity edema   Protein-calorie malnutrition, severe   Brief Summary: 41 year old female, [place], PMH of remote gastric bypass surgery in 2000, chronic abdominal pain ([facility] chronic bilateral lower extremity edema, right radial nerve palsy with outpatient neurology evaluation in progress, avascular necrosis of left hip (awaiting total hip arthroplasty pending dental, medical clearance and smoking cessation. Dr.Swintek, Ortho), iron deficiency anemia, HTN, THC abuse, recently seen by PCP and  Lasix dose adjusted for edema, given Vicodin 10/325 forpain, presented to ED with altered mental status. EMS was called to patient's home by a relative who was going to take patient to her orthopedic appointment on 06/01/16, due to difficulty awakening patient and several falls over the past 2 weeks. Patient apparently was sleeping on the floor because she was too weak to get up. Review of her home medications revealed that she had been taking more than prescribed narcotics.   Assessment & Plan:  1. Suspected opioid overdose, unintentional, with mental status changes:Patient had been using Tylenol and even Aleve despite her history of gastric bypass for pain management until recent prescription of Vicodin by PCP. I discussed with patient's orthopedic M.D. who recommended Tylenol and Ultram for pain management. He does not recommend other opioids or lidocaine patch which does not usually work well. Mental status changes have resolved. CT head negative. I discussed multiple times with patient that she should not use more than prescribed amount of any medications, especially sedative medications including opioids, benzodiazepines. I reviewed the [place] controlled substance database with the assistance of a pharmacist and noted recent prescription for Vicodin and Lyrica. As per orthopedic recommendations, prescribed her a very short supply of Ultram until she sees her outpatient PCP. She gave permission to dispose off the remainder of her narcotics that were in the hospital. I requested patient's RN to coordinate with the pharmacy to dispose her narcotics. 2. Hypokalemia/hypomagnesemia: replaced aggressively. Close outpatient follow-up. Likely multifactorial secondary to poor oral intake, gastric bypass history and diuretics. 3. Chronic bilateral lower extremity edema: Multifactorial but probably related to nutritional/hypoalbuminemia from gastric bypass surgery and mal absorption and  lymphedema  versus venous stasis. 2-D echo 02/2016 with preserved LV function. Continue cautious use of diuretics. No clinical features suggestive of infection/cellulitis. Empirically started IV vancomycin and Zosyn were discontinued. Low index of suspicion for DVT but performed lower extremity venous Dopplers which were negative for DVT. 4. Left hip and knee pain, chronic and worsening/avascular necrosis of left hip: MRI of the left hip and left knee reviewed with patient's primary orthopedic M.D. who looked at these images and indicated that patient needs to follow up with him as outpatient to plan for surgery. In the interim he recommended PT evaluation, weightbearing as tolerated, walker and pain management as above. No acute intervention in the hospital recommended.  5. Status post gastric bypass surgery and severe malnutrition in the context of chronic illness: Dietitian consultation appreciated and discussed with her. Continue supplements as recommended. 6. Hypoglycemia: Suspect secondary to poor oral intake. Initiated regular diet (patient states that she was taking regular diet at home and refused modified diet). No further hypoglycemic episodes. 7. Essential hypertension:Soft blood pressures. Toprol was discontinued during this admission. May consider initiating getting outpatient follow-up if blood pressures start to rise. 8. Right radial nerve palsy:Outpatient follow-up. 9. Chronic abdominal pain:States that she has had this since age 14and preceding her bariatric surgery and has undergone extensive evaluation and is supposed to follow up with [facility]. 10. Hypothyroid: Continue Synthroid. TSH: 1.454. 11. Iron deficiency anemia/mild thrombocytopenia: Hemoglobin stable. Has pancytopenia of unclear etiology. Close outpatient follow-up. 12. THC abuse:Cessation counseled. 13. Prolonged ZOX:[id number] on repeat EKG 3/23. Replaced potassium and magnesium. 14. Asymptomatic bacteriuria:      06/08/16 Here today for follow-up. The blood culture results returned negative. The patient what happened. I did not dictate to the patient that I already had her discharge summary and had already reviewed in detail. She was very elusive as to what happened. She states that she was found unresponsive due to hypoglycemia. She states that the hospital told her the reason she was unresponsive is because her blood sugars were low and because her blood pressure was low. At no point did she mention the narcotic overdose or that they mentioned narcotics to her and all or the patient discontinued hydrocodone and replaced it with Ultram. I did explain to the patient that I can see all the hospital records and had reviewed them. I then gave her the opportunity to again explain to me what happened. She states that she took 1 hydrocodone, drank some vodka (her alcohol level is less than 5) and was found unresponsive on the floor 3 or 4 hours later. I did explain to the patient that EMS had found her pill bottle with only 6 or 9 pills and even though she was prescribed 97 days earlier. She states that she does not give her pills to anyone else but that she had not taken all of them. She states that the others were in another bottle in her bedroom. She states that she takes some of the pain pills out of the bottle and put some an aleve bottle for convenience to carry around.  I explained to her that I don't believe is logical. She then admits that she takes more than 3 pills a day but that she had not taken that many. At no time did she give me a full and complete and on a story of what happened. She is scheduled to see orthopedic surgeon in April and her surgeon has already requested clearance for a total left  hip replacement. My concern is that her pain predates the avascular necrosis of her hip. She has been complaining of chronic pain ever since she joined this practice. She also reports chronic abdominal pain.  However she never follows up with gastroenterology. She's been referred to [facility] and [facility] but there is always a reason that she cannot make a follow-up appointment even though her pain is unrelenting and disabling. Therefore, even after the surgery, I see no insight to her request for pain medication explain this to the patient and I asked her to work with me so that we can formulate a plan to help manage her issues moving forward after her surgery I suggested that we get her in a substance abuse program and also work with the pain clinic to maybe 5 ways to manage her pain without narcotic pain medication. However the patient is unwilling to admit that she has a problem with abusing narcotic pain medication. Past Medical History:  Diagnosis Date  . Acid reflux   . Anemia   . Anxiety   . Arthritis   . AVN of femur (HCC)    left hip  . Clotting disorder (HCC)    undetermined  . Cystitis   . 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 Hypertension   . Neuromuscular disorder (HCC)   . Osteoporosis   . Psoriasis   . Thyroid disease     Past Surgical History:  Procedure Laterality Date  . CHOLECYSTECTOMY    . ESOPHAGOGASTRODUODENOSCOPY  10/2014   at [facility], was normal. Dr. Merri Brunette  . GASTRIC BYPASS  05/2001  . SMALL INTESTINE SURGERY  2004   exploratory   Current Outpatient Prescriptions on File Prior to Visit  Medication Sig Dispense Refill  . acetaminophen (TYLENOL) 325 MG tablet Take 2 tablets (650 mg total) by mouth every 6 (six) hours as needed for mild pain (or Fever >/= 101).    Marland Kitchen ALPRAZolam (XANAX) 0.5 MG tablet TAKE 1 TABLET BY MOUTH TWICE A DAY AS NEEDED 60 tablet 1  . B Complex-C (B-COMPLEX WITH VITAMIN C) tablet Take 1 tablet by mouth daily. 30 tablet 0  . calcium-vitamin D (OSCAL WITH D) 500-200 MG-UNIT tablet Take 2 tablets by mouth daily with breakfast. 60 tablet 0  . furosemide (LASIX) 40 MG tablet Take 1 tablet (40 mg total) by mouth daily as needed. (Patient taking  differently: Take 40 mg by mouth daily as needed for fluid. ) 30 tablet 3  . levothyroxine (SYNTHROID, LEVOTHROID) 125 MCG tablet TAKE 1 TABLET EVERY DAY (Patient taking differently: TAKE 125mg  TABLET by mouth  EVERY DAY) 90 tablet 0  . Multiple Vitamin (MULTIVITAMIN WITH MINERALS) TABS tablet Take 1 tablet by mouth 2 (two) times daily.    . nortriptyline (PAMELOR) 75 MG capsule TAKE ONE CAPSULE BY MOUTH EVERY EVENING AT BEDTIME 30 capsule 5  . omega-3 acid ethyl esters (LOVAZA) 1 g capsule Take 1 capsule (1 g total) by mouth daily. 30 capsule 0  . potassium chloride SA (K-DUR,KLOR-CON) 20 MEQ tablet Take 1 tablet (20 mEq total) by mouth 2 (two) times daily as needed (when taking lasix). 30 tablet 0  . pregabalin (LYRICA) 225 MG capsule TAKE 225mg  CAPSULE BY MOUTH TWICE A DAY 60 capsule 3  . protein supplement (UNJURY CHICKEN SOUP) POWD Take 27 g (8 oz total) by mouth 2 (two) times daily after a meal.    . RABEprazole (ACIPHEX) 20 MG tablet TAKE 20mg  TABLET BY MOUTH TWICE A DAY 60  tablet 5  . thiamine 100 MG tablet Take 1 tablet (100 mg total) by mouth daily. 30 tablet 0  . topiramate (TOPAMAX) 50 MG tablet TAKE 1 TABLET BY MOUTH AT BEDTIME (Patient taking differently: TAKE 50mg  TABLET BY MOUTH AT BEDTIME) 30 tablet 3  . traMADol (ULTRAM) 50 MG tablet Take 1 tablet (50 mg total) by mouth every 6 (six) hours as needed for moderate pain or severe pain. 12 tablet 0   No current facility-administered medications on file prior to visit.    Allergies  Allergen Reactions  . Ibuprofen Nausea Only and Other (See Comments)    Bad stomach pains  . Toradol [Ketorolac Tromethamine] Anxiety  . Aspirin Hives  . Cleocin [Clindamycin Hcl] Itching and Rash   Social History   Social History  . Marital status: Divorced    Spouse name: N/A  . Number of children: N/A  . Years of education: N/A   Occupational History  . Not on file.   Social History Main Topics  . Smoking status: Current Every Day Smoker     Packs/day: 0.50    Types: Cigarettes  . Smokeless tobacco: Never Used  . Alcohol use 6.0 oz/week    10 Shots of liquor per week  . Drug use: No  . Sexual activity: Yes    Birth control/ protection: Pill   Other Topics Concern  . Not on file   Social History Narrative  . No narrative on file    Review of Systems  All other systems reviewed and are negative.      Objective:   Physical Exam  Constitutional: She appears well-developed and well-nourished. No distress.  Cardiovascular: Normal rate, regular rhythm and normal heart sounds.   Pulmonary/Chest: Effort normal and breath sounds normal. No respiratory distress. She has no wheezes. She has no rales.  Skin: She is not diaphoretic.  Vitals reviewed.   ambulating with walker      Assessment & Plan:  Chronic, continuous use of opioids - Plan: Ambulatory referral to Pain Clinic  Chronic abdominal pain - Plan: Ambulatory referral to Pain Clinic  Avascular necrosis of left femur (HCC) - Plan: Ambulatory referral to Pain Clinic  Abdominal pain, chronic, generalized  More than 30 minutes were spent with the patient in discussion. Patient will not acknowledge that she has a problem with narcotic abuse.  I am truly worried about this patient. I think it is only a matter of time before she overdoses. As for my part, I will no longer prescribe any narcotic pain medication or any benzodiazepine. I would be willing to prescribe a lower dose of Lyrica and I will start by reducing her dose to 100 mg 3 times a day. However I will not refer her to a psychiatrist as she is not willing to admit there is a problem and therefore I believe this visit would be fruitless. She insists that something else must have happened.  I recommended a substance abuse treatment program but the patient must make that commitment on her own and she is not yet at the time that she will do that. She is medically cleared to proceed with her upcoming hip  replacement. This may help manage the pain in her hip but  this will obviously not help her chronic abdominal pain. Therefore I will refer the patient to a pain clinic but I will no longer prescribe any opiate medication to this patient.

## 2016-06-08 NOTE — Telephone Encounter (Signed)
Pt in office today and this was approved and she has picked up from pharmacy.

## 2016-06-10 DIAGNOSIS — D509 Iron deficiency anemia, unspecified: Secondary | ICD-10-CM | POA: Diagnosis not present

## 2016-06-10 DIAGNOSIS — E43 Unspecified severe protein-calorie malnutrition: Secondary | ICD-10-CM | POA: Diagnosis not present

## 2016-06-10 DIAGNOSIS — G5631 Lesion of radial nerve, right upper limb: Secondary | ICD-10-CM | POA: Diagnosis not present

## 2016-06-10 DIAGNOSIS — M25562 Pain in left knee: Secondary | ICD-10-CM | POA: Diagnosis not present

## 2016-06-10 DIAGNOSIS — G9340 Encephalopathy, unspecified: Secondary | ICD-10-CM | POA: Diagnosis not present

## 2016-06-10 DIAGNOSIS — G8929 Other chronic pain: Secondary | ICD-10-CM | POA: Diagnosis not present

## 2016-06-10 DIAGNOSIS — I10 Essential (primary) hypertension: Secondary | ICD-10-CM | POA: Diagnosis not present

## 2016-06-10 DIAGNOSIS — F1220 Cannabis dependence, uncomplicated: Secondary | ICD-10-CM | POA: Diagnosis not present

## 2016-06-10 DIAGNOSIS — R69 Illness, unspecified: Secondary | ICD-10-CM | POA: Diagnosis not present

## 2016-06-10 DIAGNOSIS — R600 Localized edema: Secondary | ICD-10-CM | POA: Diagnosis not present

## 2016-06-10 DIAGNOSIS — M25552 Pain in left hip: Secondary | ICD-10-CM | POA: Diagnosis not present

## 2016-06-12 DIAGNOSIS — E43 Unspecified severe protein-calorie malnutrition: Secondary | ICD-10-CM | POA: Diagnosis not present

## 2016-06-12 DIAGNOSIS — G9340 Encephalopathy, unspecified: Secondary | ICD-10-CM | POA: Diagnosis not present

## 2016-06-12 DIAGNOSIS — G5631 Lesion of radial nerve, right upper limb: Secondary | ICD-10-CM | POA: Diagnosis not present

## 2016-06-12 DIAGNOSIS — R600 Localized edema: Secondary | ICD-10-CM | POA: Diagnosis not present

## 2016-06-12 DIAGNOSIS — G8929 Other chronic pain: Secondary | ICD-10-CM | POA: Diagnosis not present

## 2016-06-12 DIAGNOSIS — D509 Iron deficiency anemia, unspecified: Secondary | ICD-10-CM | POA: Diagnosis not present

## 2016-06-12 DIAGNOSIS — M25562 Pain in left knee: Secondary | ICD-10-CM | POA: Diagnosis not present

## 2016-06-12 DIAGNOSIS — R69 Illness, unspecified: Secondary | ICD-10-CM | POA: Diagnosis not present

## 2016-06-12 DIAGNOSIS — M25552 Pain in left hip: Secondary | ICD-10-CM | POA: Diagnosis not present

## 2016-06-12 DIAGNOSIS — I10 Essential (primary) hypertension: Secondary | ICD-10-CM | POA: Diagnosis not present

## 2016-06-13 DIAGNOSIS — M87052 Idiopathic aseptic necrosis of left femur: Secondary | ICD-10-CM | POA: Diagnosis not present

## 2016-06-15 DIAGNOSIS — R600 Localized edema: Secondary | ICD-10-CM | POA: Diagnosis not present

## 2016-06-15 DIAGNOSIS — E43 Unspecified severe protein-calorie malnutrition: Secondary | ICD-10-CM | POA: Diagnosis not present

## 2016-06-15 DIAGNOSIS — M25562 Pain in left knee: Secondary | ICD-10-CM | POA: Diagnosis not present

## 2016-06-15 DIAGNOSIS — R69 Illness, unspecified: Secondary | ICD-10-CM | POA: Diagnosis not present

## 2016-06-15 DIAGNOSIS — G8929 Other chronic pain: Secondary | ICD-10-CM | POA: Diagnosis not present

## 2016-06-15 DIAGNOSIS — M25552 Pain in left hip: Secondary | ICD-10-CM | POA: Diagnosis not present

## 2016-06-15 DIAGNOSIS — G9340 Encephalopathy, unspecified: Secondary | ICD-10-CM | POA: Diagnosis not present

## 2016-06-15 DIAGNOSIS — G5631 Lesion of radial nerve, right upper limb: Secondary | ICD-10-CM | POA: Diagnosis not present

## 2016-06-15 DIAGNOSIS — D509 Iron deficiency anemia, unspecified: Secondary | ICD-10-CM | POA: Diagnosis not present

## 2016-06-15 DIAGNOSIS — I10 Essential (primary) hypertension: Secondary | ICD-10-CM | POA: Diagnosis not present

## 2016-06-19 ENCOUNTER — Encounter: Payer: Self-pay | Admitting: Family Medicine

## 2016-06-26 ENCOUNTER — Other Ambulatory Visit: Payer: Self-pay | Admitting: Family Medicine

## 2016-06-26 MED ORDER — RABEPRAZOLE SODIUM 20 MG PO TBEC: DELAYED_RELEASE_TABLET | ORAL | 3 refills | Status: DC

## 2016-06-26 MED ORDER — NORTRIPTYLINE HCL 75 MG PO CAPS: ORAL_CAPSULE | ORAL | 2 refills | Status: DC

## 2016-06-26 NOTE — Addendum Note (Signed)
Addended by: Legrand Rams B on: 06/26/2016 09:16 AM   Modules accepted: Orders

## 2016-06-28 ENCOUNTER — Other Ambulatory Visit: Payer: Self-pay | Admitting: Family Medicine

## 2016-06-29 ENCOUNTER — Other Ambulatory Visit: Payer: Self-pay | Admitting: Family Medicine

## 2016-06-29 MED ORDER — RABEPRAZOLE SODIUM 20 MG PO TBEC: DELAYED_RELEASE_TABLET | ORAL | 5 refills | Status: AC

## 2016-07-06 ENCOUNTER — Encounter (HOSPITAL_COMMUNITY): Payer: Self-pay | Admitting: Emergency Medicine

## 2016-07-06 ENCOUNTER — Emergency Department (HOSPITAL_COMMUNITY)
Admission: EM | Admit: 2016-07-06 | Discharge: 2016-07-06 | Disposition: A | Discharge status: Home / Self Care | Payer: Medicare HMO | Attending: Emergency Medicine | Admitting: Emergency Medicine

## 2016-07-06 ENCOUNTER — Emergency Department (HOSPITAL_COMMUNITY): Payer: Medicare HMO

## 2016-07-06 DIAGNOSIS — S42001A Fracture of unspecified part of right clavicle, initial encounter for closed fracture: Secondary | ICD-10-CM

## 2016-07-06 DIAGNOSIS — Y999 Unspecified external cause status: Secondary | ICD-10-CM | POA: Diagnosis not present

## 2016-07-06 DIAGNOSIS — W19XXXA Unspecified fall, initial encounter: Secondary | ICD-10-CM | POA: Diagnosis not present

## 2016-07-06 DIAGNOSIS — Y939 Activity, unspecified: Secondary | ICD-10-CM | POA: Diagnosis not present

## 2016-07-06 DIAGNOSIS — S42024A Nondisplaced fracture of shaft of right clavicle, initial encounter for closed fracture: Secondary | ICD-10-CM | POA: Diagnosis not present

## 2016-07-06 DIAGNOSIS — S42021A Displaced fracture of shaft of right clavicle, initial encounter for closed fracture: Secondary | ICD-10-CM | POA: Diagnosis not present

## 2016-07-06 DIAGNOSIS — Y929 Unspecified place or not applicable: Secondary | ICD-10-CM | POA: Insufficient documentation

## 2016-07-06 DIAGNOSIS — I10 Essential (primary) hypertension: Secondary | ICD-10-CM | POA: Insufficient documentation

## 2016-07-06 DIAGNOSIS — F17210 Nicotine dependence, cigarettes, uncomplicated: Secondary | ICD-10-CM | POA: Diagnosis not present

## 2016-07-06 DIAGNOSIS — M25511 Pain in right shoulder: Secondary | ICD-10-CM | POA: Diagnosis not present

## 2016-07-06 DIAGNOSIS — S4991XA Unspecified injury of right shoulder and upper arm, initial encounter: Secondary | ICD-10-CM | POA: Diagnosis present

## 2016-07-06 DIAGNOSIS — R69 Illness, unspecified: Secondary | ICD-10-CM | POA: Diagnosis not present

## 2016-07-06 NOTE — ED Provider Notes (Signed)
[facility]-EMERGENCY DEPT Provider Note   CSN: [id number] Arrival date & time: 07/06/16  1714   By signing my name below, I, Clarisse Gouge, attest that this documentation has been prepared under the direction and in the presence of Benay Pomeroy, PA-C. Electronically Signed: Clarisse Gouge, Scribe. 07/06/16. 6:21 PM.   History   Chief Complaint Chief Complaint  Patient presents with  . Shoulder Pain   The history is provided by the patient and medical records. No language interpreter was used.    Dorothy Dyer is a 41 y.o. female with h/o arthritis, who presents to the Emergency Department with concern for severe R clavicle pain x 2 days. She notes multiple falls in the past week d/t unsteadiness from chronic L hip pain. Associated bruising noted to the area. Pt describes 8/10, constant, aching R shoulder pain. Pt states she has used tylenol and aleve without relief to pain. No other modifying factors noted. Records indicate pt discharged from hospitalization on 06/03/2016 for hypotension d/t opiate overdose. Records further indicate the pt has received 2 scripts for tramadol this month. Pt reportedly R handed. 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 Pt followed by her PCP for multiple chronic illnesses. No head injury, LOC or any other complaints noted at this time.   Past Medical History:  Diagnosis Date  . Abdominal pain, chronic, generalized   . Acid reflux   . Anemia   . Anxiety   . Arthritis   . AVN of femur (HCC)    left hip  . Clotting disorder (HCC)    undetermined  . Cystitis   . 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 Hypertension   . Neuromuscular disorder (HCC)   . Osteoporosis   . Psoriasis   . Thyroid disease     Patient Active Problem List   Diagnosis Date Noted  . Abdominal pain, chronic, generalized   . Protein-calorie malnutrition, severe 06/02/2016  . Acute encephalopathy 06/01/2016   . Chronic left hip pain 06/01/2016  . Overdose of opiate or related narcotic, accidental or unintentional, initial encounter 06/01/2016  . Lower extremity cellulitis 06/01/2016  . 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 Left knee pain 06/01/2016  . Bilateral lower extremity edema 06/01/2016  . Acute hypokalemia 02/11/2016  . Hypoglycemia 02/11/2016  . Marijuana abuse 02/11/2016  . Chronic narcotic use 02/11/2016  . AKI (acute kidney injury) (HCC) 02/10/2016  . AVN of femur (HCC)   . 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 Hoarseness 06/17/2014  . 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 Fibromyalgia 06/17/2014  . Chronic abdominal pain 06/17/2014  . Knee pain, bilateral 06/17/2014  . Vitamin D deficiency 06/17/2014  . Vitamin B12 deficiency 06/17/2014  . Iron deficiency anemia 06/17/2014  . Thyroid activity decreased 06/17/2014  . Abnormal Pap smear of cervix 06/17/2014  . Anemia   . Anxiety   . Arthritis   . Acid reflux   . Neuromuscular disorder (HCC)   . Hypertension   . Osteoporosis   . HPV (human papilloma virus) infection     Past Surgical History:  Procedure Laterality Date  . CHOLECYSTECTOMY    . ESOPHAGOGASTRODUODENOSCOPY  10/2014   at [facility], was normal. Dr. Merri Brunette  . GASTRIC BYPASS  05/2001  . SMALL INTESTINE SURGERY  2004   exploratory    OB History    No data available       Home Medications    Prior to  Admission medications   Medication Sig Start Date End Date Taking? Authorizing Provider  acetaminophen (TYLENOL) 325 MG tablet Take 2 tablets (650 mg total) by mouth every 6 (six) hours as needed for mild pain (or Fever >/= 101). 06/03/16   Elease Etienne, MD  ALPRAZolam Prudy Feeler) 0.5 MG tablet TAKE 1 TABLET BY MOUTH TWICE A DAY AS NEEDED 04/13/16   Donita Brooks, MD  B Complex-C (B-COMPLEX WITH VITAMIN C) tablet Take 1 tablet by mouth daily. 06/04/16   Elease Etienne, MD  calcium-vitamin D (OSCAL WITH D) 500-200 MG-UNIT tablet Take 2 tablets by mouth daily with  breakfast. 06/04/16   Elease Etienne, MD  furosemide (LASIX) 40 MG tablet Take 1 tablet (40 mg total) by mouth daily as needed. Patient taking differently: Take 40 mg by mouth daily as needed for fluid.  03/31/16   Donita Brooks, MD  levothyroxine (SYNTHROID, LEVOTHROID) 125 MCG tablet TAKE 1 TABLET EVERY DAY Patient taking differently: TAKE  TABLET by mouth  EVERY DAY 11/25/15   Donita Brooks, MD  Multiple Vitamin (MULTIVITAMIN WITH MINERALS) TABS tablet Take 1 tablet by mouth 2 (two) times daily. 06/04/16   Elease Etienne, MD  nortriptyline (PAMELOR) 75 MG capsule TAKE ONE CAPSULE BY MOUTH EVERY EVENING AT BEDTIME 06/26/16   Donita Brooks, MD  omega-3 acid ethyl esters (LOVAZA) 1 g capsule Take 1 capsule (1 g total) by mouth daily. 06/04/16   Elease Etienne, MD  potassium chloride SA (K-DUR,KLOR-CON) 20 MEQ tablet Take 1 tablet (20 mEq total) by mouth 2 (two) times daily as needed (when taking lasix). 06/03/16   Elease Etienne, MD  pregabalin (LYRICA) 225 MG capsule TAKE  CAPSULE BY MOUTH TWICE A DAY 04/06/16   Donita Brooks, MD  protein supplement [facility] CHICKEN SOUP) POWD Take 27 g (8 oz total) by mouth 2 (two) times daily after a meal. 06/04/16   Elease Etienne, MD  RABEprazole (ACIPHEX) 20 MG tablet 1 tab po bid 06/29/16   Donita Brooks, MD  thiamine 100 MG tablet Take 1 tablet (100 mg total) by mouth daily. 06/04/16   Elease Etienne, MD  topiramate (TOPAMAX) 50 MG tablet TAKE 1 TABLET BY MOUTH AT BEDTIME Patient taking differently: TAKE  TABLET BY MOUTH AT BEDTIME 01/31/16   Donita Brooks, MD  traMADol (ULTRAM) 50 MG tablet Take 1 tablet (50 mg total) by mouth every 6 (six) hours as needed for moderate pain or severe pain. 06/03/16   Elease Etienne, MD    Family History Family History  Problem Relation Age of Onset  . Depression Mother   . Diabetes Mother   . Hyperlipidemia Mother   . Depression Father   . Hearing loss Father   . Hyperlipidemia  Father   . Hypertension Father   . Learning disabilities Father   . Mental illness Father   . Asthma Brother   . Alcohol abuse Maternal Grandmother   . Arthritis Maternal Grandmother   . Cancer Maternal Grandmother   . Cancer Maternal Grandfather   . Mental illness Paternal Grandmother   . Heart disease Paternal Grandfather   . Stroke Paternal Grandfather     Social History Social History  Substance Use Topics  . Smoking status: Current Every Day Smoker    Packs/day: 0.50    Types: Cigarettes  . Smokeless tobacco: Never Used  . Alcohol use 6.0 oz/week    10 Shots of liquor per week  Allergies   Ibuprofen; Toradol [ketorolac tromethamine]; Aspirin; and Cleocin [clindamycin hcl]   Review of Systems Review of Systems  Musculoskeletal: Positive for arthralgias and myalgias. Negative for joint swelling.  Skin: Positive for color change. Negative for wound.  All other systems reviewed and are negative.    Physical Exam Updated Vital Signs BP 114/80   Pulse 89   Temp 98.3 F (36.8 C) (Oral)   Resp 16   SpO2 100%   Physical Exam  Constitutional: She is oriented to person, place, and time. She appears well-developed and well-nourished.  HENT:  Head: Normocephalic and atraumatic.  Eyes: Conjunctivae are normal. Pupils are equal, round, and reactive to light. Right eye exhibits no discharge. Left eye exhibits no discharge. No scleral icterus.  Neck: Normal range of motion. No JVD present. No tracheal deviation present.  Pulmonary/Chest: Effort normal. No stridor.  Musculoskeletal:  Deformity, bruising, swelling to the right clavicle. Skin is intact. No tenderness to palpation over the shoulder joint. Pain with range of motion of the shoulder joint in any directions. Normal elbow, wrist. Distal radial pulses intact.  Neurological: She is alert and oriented to person, place, and time. Coordination normal.  Psychiatric: She has a normal mood and affect. Her behavior is  normal. Judgment and thought content normal.  Nursing note and vitals reviewed.    ED Treatments / Results  DIAGNOSTIC STUDIES: Oxygen Saturation is 100% on RA, NL by my interpretation.    COORDINATION OF CARE: 6:10 PM-Discussed next steps with pt. PT verbalized understanding and is agreeable with the plan. Will order imaging.   Labs (all labs ordered are listed, but only abnormal results are displayed) Labs Reviewed - No data to display  EKG  EKG Interpretation None       Radiology No results found.  Procedures Procedures (including critical care time)  Medications Ordered in ED Medications - No data to display   Initial Impression / Assessment and Plan / ED Course  I have reviewed the triage vital signs and the nursing notes.  Pertinent labs & imaging results that were available during my care of the patient were reviewed by me and considered in my medical decision making (see chart for details).     Pt in ED with Right clavicle injury after falling. Patient states she falls quite frequently because of left hip problems. Patient states that she is waiting for dental clearance for her hip replacement. She normally uses a walker. Exam was concerning for clavicle fracture. She denies any other complaints. We'll get x-rays.   7:12 PM X-rays show a nondisplaced mid right clavicle fracture. We'll place in a sling. Discussed with patient rest, ice, pain management. Patient is requesting stronger pain medications for home. She initially told us she is taking Tylenol and Motrin for her pain, however I did look up patient on controlled substances database and it appears she has had gotten total of 120 tablets of tramadol in the last 3 weeks. She is receiving his medications from Dr. Leroy Libman, her orthopedist. I also reviewed patient's past medical history recent admission. She was just admitted and discharged last month for altered mental status which to be due to accidental  opiate overdose. It looks like patient was on morphine, hydrocodone 10 mg in the past and she was taken off of these medications due to this overdose one month ago. I do not think it is the safest to start patient on opiates again given recent hospitalization and frequent falls. I advised her  to continue to take tramadol, Tylenol, Motrin, and follow up with orthopedic specialist.  Vitals:   07/06/16 1723  BP: 114/80  Pulse: 89  Resp: 16  Temp: 98.3 F (36.8 C)  TempSrc: Oral  SpO2: 100%     Final Clinical Impressions(s) / ED Diagnoses   Final diagnoses:  Closed nondisplaced fracture of right clavicle, unspecified part of clavicle, initial encounter    New Prescriptions New Prescriptions   No medications on file   I personally performed the services described in this documentation, which was scribed in my presence. The recorded information has been reviewed and is accurate.    Jaynie Crumble, PA-C 07/06/16 1917    Lyndal Pulley, MD 07/07/16 [phone]

## 2016-07-06 NOTE — Discharge Instructions (Signed)
Ice. Avoid using right arm. Sling for support. Continue tramadol, tylenol, motrin for pain. Follow up Dr. Linna Caprice.

## 2016-07-06 NOTE — ED Triage Notes (Signed)
Pt presents to ED for assessment of right shoulder pain after multiple falls.  Patient states she needs to have a hip replacement, and has neropathy to her cane holding hand and falls often.  Patient is being monitored by her PCP for her chronic illnesses.  Patient states she has fallen to that side in the past, and states pain has been worsening x 2 days.

## 2016-07-06 NOTE — ED Notes (Signed)
Patient Alert and oriented X4. Stable and ambulatory. Patient verbalized understanding of the discharge instructions.  Patient belongings were taken by the patient.  

## 2016-07-14 ENCOUNTER — Encounter: Payer: Self-pay | Admitting: Family Medicine

## 2016-07-25 ENCOUNTER — Encounter: Payer: Self-pay | Admitting: Family Medicine

## 2016-07-25 ENCOUNTER — Ambulatory Visit: Payer: Medicare HMO | Admitting: Family Medicine

## 2016-07-25 ENCOUNTER — Ambulatory Visit (INDEPENDENT_AMBULATORY_CARE_PROVIDER_SITE_OTHER): Payer: Medicare HMO | Admitting: Family Medicine

## 2016-07-25 ENCOUNTER — Ambulatory Visit
Admission: RE | Admit: 2016-07-25 | Discharge: 2016-07-25 | Disposition: A | Discharge status: Home / Self Care | Payer: Medicare HMO | Source: Ambulatory Visit | Attending: Family Medicine | Admitting: Family Medicine

## 2016-07-25 ENCOUNTER — Other Ambulatory Visit: Payer: Self-pay | Admitting: Family Medicine

## 2016-07-25 VITALS — BP 110/80 | HR 96 | Temp 97.9°F | Resp 20 | Ht 61.5 in

## 2016-07-25 DIAGNOSIS — L03115 Cellulitis of right lower limb: Secondary | ICD-10-CM

## 2016-07-25 DIAGNOSIS — M79661 Pain in right lower leg: Secondary | ICD-10-CM

## 2016-07-25 DIAGNOSIS — M7989 Other specified soft tissue disorders: Principal | ICD-10-CM

## 2016-07-25 DIAGNOSIS — M79604 Pain in right leg: Secondary | ICD-10-CM | POA: Diagnosis not present

## 2016-07-25 DIAGNOSIS — R2241 Localized swelling, mass and lump, right lower limb: Secondary | ICD-10-CM | POA: Diagnosis not present

## 2016-07-25 MED ORDER — CEPHALEXIN 500 MG PO CAPS: 500.0000 mg | ORAL_CAPSULE | Freq: Three times a day (TID) | ORAL | 0 refills | Status: DC

## 2016-07-25 NOTE — Progress Notes (Signed)
Subjective:    Patient ID: Dorothy Dyer, female    DOB: 12/11/1975, 41 y.o.   MRN: [id number]  HPI  Recently admitted to the hospital with altered mental status secondary to narcotic overdose. I've copied relevant portions of the discharge summary and included them below for my reference:  Admit date: 06/01/2016 Discharge date: 06/03/2016  Recommendations for Outpatient Follow-up:  1. Dr. Gilmore Larocheom Adrianne Shackleton, PCP in 3 days with repeat labs (CBC & CMP). She does have a prior appointment on 06/06/16 at 10:15 AM.Please follow final blood culture results that were sent from the hospital. 2. Dr. Loreli DollarBrian Swintek, Orthopedics: Patient is advised to call their office on 06/05/16 for a follow-up appointment.  Home Health: PT, home health aide Equipment/Devices: Rolling walker. 3n1   Discharge Condition: Improved and stable.  CODE STATUS: Full.  Diet recommendation: Heart healthy diet.  Discharge Diagnoses:  Principal Problem:   Acute encephalopathy Active Problems:   Hypertension   H/O gastric bypass   Chronic abdominal pain   Iron deficiency anemia   Acute hypokalemia   Hypoglycemia   Marijuana abuse   Chronic left hip pain   Overdose of opiate or related narcotic, accidental or unintentional, initial encounter   Lower extremity cellulitis   Radial nerve palsy, right   Left knee pain   Bilateral lower extremity edema   Protein-calorie malnutrition, severe   Brief Summary: 41 year old female, [place], PMH of remote gastric bypass surgery in 2000, chronic abdominal pain ([facility]), chronic bilateral lower extremity edema, right radial nerve palsy with outpatient neurology evaluation in progress, avascular necrosis of left hip (awaiting total hip arthroplasty pending dental, medical clearance and smoking cessation. Dr.Swintek, Ortho), iron deficiency anemia, HTN, THC abuse, recently seen by PCP and  Lasix dose adjusted for edema, given Vicodin 10/325 forpain, presented to ED with altered mental status. EMS was called to patient's home by a relative who was going to take patient to her orthopedic appointment on 06/01/16, due to difficulty awakening patient and several falls over the past 2 weeks. Patient apparently was sleeping on the floor because she was too weak to get up. Review of her home medications revealed that she had been taking more than prescribed narcotics.   Assessment & Plan:  1. Suspected opioid overdose, unintentional, with mental status changes:Patient had been using Tylenol and even Aleve despite her history of gastric bypass for pain management until recent prescription of Vicodin by PCP. I discussed with patient's orthopedic M.D. who recommended Tylenol and Ultram for pain management. He does not recommend other opioids or lidocaine patch which does not usually work well. Mental status changes have resolved. CT head negative. I discussed multiple times with patient that she should not use more than prescribed amount of any medications, especially sedative medications including opioids, benzodiazepines. I reviewed the [place] controlled substance database with the assistance of a pharmacist and noted recent prescription for Vicodin and Lyrica. As per orthopedic recommendations, prescribed her a very short supply of Ultram until she sees her outpatient PCP. She gave permission to dispose off the remainder of her narcotics that were in the hospital. I requested patient's RN to coordinate with the pharmacy to dispose her narcotics. 2. Hypokalemia/hypomagnesemia: replaced aggressively. Close outpatient follow-up. Likely multifactorial secondary to poor oral intake, gastric bypass history and diuretics. 3. Chronic bilateral lower extremity edema: Multifactorial but probably related to nutritional/hypoalbuminemia from gastric bypass surgery and mal absorption and  lymphedema  versus venous stasis. 2-D echo 02/2016 with preserved LV function. Continue cautious use of diuretics. No clinical features suggestive of infection/cellulitis. Empirically started IV vancomycin and Zosyn were discontinued. Low index of suspicion for DVT but performed lower extremity venous Dopplers which were negative for DVT. 4. Left hip and knee pain, chronic and worsening/avascular necrosis of left hip: MRI of the left hip and left knee reviewed with patient's primary orthopedic M.D. who looked at these images and indicated that patient needs to follow up with him as outpatient to plan for surgery. In the interim he recommended PT evaluation, weightbearing as tolerated, walker and pain management as above. No acute intervention in the hospital recommended.  5. Status post gastric bypass surgery and severe malnutrition in the context of chronic illness: Dietitian consultation appreciated and discussed with her. Continue supplements as recommended. 6. Hypoglycemia: Suspect secondary to poor oral intake. Initiated regular diet (patient states that she was taking regular diet at home and refused modified diet). No further hypoglycemic episodes. 7. Essential hypertension:Soft blood pressures. Toprol was discontinued during this admission. May consider initiating getting outpatient follow-up if blood pressures start to rise. 8. Right radial nerve palsy:Outpatient follow-up. 9. Chronic abdominal pain:States that she has had this since age 14and preceding her bariatric surgery and has undergone extensive evaluation and is supposed to follow up with [facility]. 10. Hypothyroid: Continue Synthroid. TSH: 1.454. 11. Iron deficiency anemia/mild thrombocytopenia: Hemoglobin stable. Has pancytopenia of unclear etiology. Close outpatient follow-up. 12. THC abuse:Cessation counseled. 13. Prolonged ZOX:[id number] on repeat EKG 3/23. Replaced potassium and magnesium. 14. Asymptomatic bacteriuria:      06/08/16 Here today for follow-up. The blood culture results returned negative. The patient what happened. I did not dictate to the patient that I already had her discharge summary and had already reviewed in detail. She was very elusive as to what happened. She states that she was found unresponsive due to hypoglycemia. She states that the hospital told her the reason she was unresponsive is because her blood sugars were low and because her blood pressure was low. At no point did she mention the narcotic overdose or that they mentioned narcotics to her and all or the patient discontinued hydrocodone and replaced it with Ultram. I did explain to the patient that I can see all the hospital records and had reviewed them. I then gave her the opportunity to again explain to me what happened. She states that she took 1 hydrocodone, drank some vodka (her alcohol level is less than 5) and was found unresponsive on the floor 3 or 4 hours later. I did explain to the patient that EMS had found her pill bottle with only 6 or 9 pills and even though she was prescribed 97 days earlier. She states that she does not give her pills to anyone else but that she had not taken all of them. She states that the others were in another bottle in her bedroom. She states that she takes some of the pain pills out of the bottle and put some an aleve bottle for convenience to carry around.  I explained to her that I don't believe is logical. She then admits that she takes more than 3 pills a day but that she had not taken that many. At no time did she give me a full and complete and on a story of what happened. She is scheduled to see orthopedic surgeon in April and her surgeon has already requested clearance for a total left  hip replacement. My concern is that her pain predates the avascular necrosis of her hip. She has been complaining of chronic pain ever since she joined this practice. She also reports chronic abdominal pain.  However she never follows up with gastroenterology. She's been referred to [facility] and [facility] but there is always a reason that she cannot make a follow-up appointment even though her pain is unrelenting and disabling. Therefore, even after the surgery, I see no insight to her request for pain medication explain this to the patient and I asked her to work with me so that we can formulate a plan to help manage her issues moving forward after her surgery I suggested that we get her in a substance abuse program and also work with the pain clinic to maybe 5 ways to manage her pain without narcotic pain medication. However the patient is unwilling to admit that she has a problem with abusing narcotic pain medication.  At that time, my plan was: More than 30 minutes were spent with the patient in discussion. Patient will not acknowledge that she has a problem with narcotic abuse.  I am truly worried about this patient. I think it is only a matter of time before she overdoses. As for my part, I will no longer prescribe any narcotic pain medication or any benzodiazepine. I would be willing to prescribe a lower dose of Lyrica and I will start by reducing her dose to 100 mg 3 times a day. However I will not refer her to a psychiatrist as she is not willing to admit there is a problem and therefore I believe this visit would be fruitless. She insists that something else must have happened.  I recommended a substance abuse treatment program but the patient must make that commitment on her own and she is not yet at the time that she will do that. She is medically cleared to proceed with her upcoming hip replacement. This may help manage the pain in her hip but  this will obviously not help her chronic abdominal pain. Therefore I will refer the patient to a pain clinic but I will no longer prescribe any opiate medication to this patient.  07/25/16 Since I last saw the patient, she has fallen and fractured her right clavicle.  Apparently she is receiving tramadol for pain from her orthopedist, Dr. Veda Canning.  I reviewed the emergency room findings from the visit in April where she was diagnosed with a clavicle fracture. Patient states that she's been having significant swelling in both legs for several weeks. Recently developed a venous stasis ulcer this approximately 1.5 cm in diameter on the anterior aspect of her right shin. The skin has now become extremely red hot and painful. There is erythema streaking up from her ankle to her knee. However she reports pain shooting up her leg with ambulation from her calf to her mid medial thigh. She also has significant pain with very gentle palpation of her calf raising the concern for possible DVT. Swelling is much worse right leg compared to left leg. In fact the lower portions of her pants are wet due to serous drainage coming from her weeping pitting edema in both legs. Past Medical History:  Diagnosis Date  . Abdominal pain, chronic, generalized   . Acid reflux   . Anemia   . Anxiety   . Arthritis   . AVN of femur (HCC)    left hip  . Clotting disorder (HCC)    undetermined  .  Cystitis   . 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 Hypertension   . Neuromuscular disorder (HCC)   . Osteoporosis   . Psoriasis   . Thyroid disease     Past Surgical History:  Procedure Laterality Date  . CHOLECYSTECTOMY    . ESOPHAGOGASTRODUODENOSCOPY  10/2014   at [facility], was normal. Dr. Merri Brunette  . GASTRIC BYPASS  05/2001  . SMALL INTESTINE SURGERY  2004   exploratory   Current Outpatient Prescriptions on File Prior to Visit  Medication Sig Dispense Refill  . acetaminophen (TYLENOL) 325 MG tablet Take 2 tablets (650 mg total) by mouth every 6 (six) hours as needed for mild pain (or Fever >/= 101).    Marland Kitchen ALPRAZolam (XANAX) 0.5 MG tablet TAKE 1 TABLET BY MOUTH TWICE A DAY AS NEEDED 60 tablet 1  . B Complex-C (B-COMPLEX WITH VITAMIN C) tablet Take 1 tablet by mouth daily. 30 tablet  0  . calcium-vitamin D (OSCAL WITH D) 500-200 MG-UNIT tablet Take 2 tablets by mouth daily with breakfast. 60 tablet 0  . furosemide (LASIX) 40 MG tablet Take 1 tablet (40 mg total) by mouth daily as needed. (Patient taking differently: Take 40 mg by mouth daily as needed for fluid. ) 30 tablet 3  . levothyroxine (SYNTHROID, LEVOTHROID) 125 MCG tablet TAKE 1 TABLET EVERY DAY (Patient taking differently: TAKE 125mg  TABLET by mouth  EVERY DAY) 90 tablet 0  . Multiple Vitamin (MULTIVITAMIN WITH MINERALS) TABS tablet Take 1 tablet by mouth 2 (two) times daily.    . nortriptyline (PAMELOR) 75 MG capsule TAKE ONE CAPSULE BY MOUTH EVERY EVENING AT BEDTIME 90 capsule 2  . omega-3 acid ethyl esters (LOVAZA) 1 g capsule Take 1 capsule (1 g total) by mouth daily. 30 capsule 0  . potassium chloride SA (K-DUR,KLOR-CON) 20 MEQ tablet Take 1 tablet (20 mEq total) by mouth 2 (two) times daily as needed (when taking lasix). 30 tablet 0  . pregabalin (LYRICA) 225 MG capsule TAKE 225mg  CAPSULE BY MOUTH TWICE A DAY 60 capsule 3  . protein supplement (UNJURY CHICKEN SOUP) POWD Take 27 g (8 oz total) by mouth 2 (two) times daily after a meal.    . RABEprazole (ACIPHEX) 20 MG tablet 1 tab po bid 60 tablet 5  . thiamine 100 MG tablet Take 1 tablet (100 mg total) by mouth daily. 30 tablet 0  . topiramate (TOPAMAX) 50 MG tablet TAKE 1 TABLET BY MOUTH AT BEDTIME (Patient taking differently: TAKE 50mg  TABLET BY MOUTH AT BEDTIME) 30 tablet 3  . traMADol (ULTRAM) 50 MG tablet Take 1 tablet (50 mg total) by mouth every 6 (six) hours as needed for moderate pain or severe pain. 12 tablet 0   No current facility-administered medications on file prior to visit.    Allergies  Allergen Reactions  . Ibuprofen Nausea Only and Other (See Comments)    Bad stomach pains  . Toradol [Ketorolac Tromethamine] Anxiety  . Aspirin Hives  . Cleocin [Clindamycin Hcl] Itching and Rash   Social History   Social History  . Marital status:  Divorced    Spouse name: N/A  . Number of children: N/A  . Years of education: N/A   Occupational History  . Not on file.   Social History Main Topics  . Smoking status: Current Every Day Smoker    Packs/day: 0.50    Types: Cigarettes  . Smokeless tobacco: Never Used  . Alcohol use 6.0 oz/week    10 Shots of  liquor per week  . Drug use: No  . Sexual activity: Yes    Birth control/ protection: Pill   Other Topics Concern  . Not on file   Social History Narrative  . No narrative on file    Review of Systems  All other systems reviewed and are negative.      Objective:   Physical Exam  Constitutional: She appears well-developed and well-nourished. No distress.  Cardiovascular: Normal rate, regular rhythm and normal heart sounds.   Pulmonary/Chest: Effort normal and breath sounds normal. No respiratory distress. She has no wheezes. She has no rales.  Musculoskeletal: She exhibits edema and tenderness.  Skin: Skin is warm. She is not diaphoretic. There is erythema.  Vitals reviewed.  See HPI ambulating with walker Significant erythema on the distal portion of her right lower leg. Erythema extends up to the knee. This is markedly different and compared to her left leg. She has extreme tenderness to palpation of her right calf and her right shin. She reports pain radiating up the right leg from her calf to her mid thigh.     Assessment & Plan:  Right leg swelling - Plan: US Venous Img Lower Bilateral Patient has chronic venous insufficiency with chronic pitting edema in both legs. However the swelling in her right leg is asymmetric. Given the redness, the warmth, and the pain, I'm concerned the patient may have a DVT. I will try to have her scheduled urgently for an ultrasound ruled out a DVT. At a bare minimum, the patient has cellulitis and needs antibiotics. She also needs diuresis to help with the swelling is both legs are tense with pitting edema.  I'll have her take  Zaroxolyn 5 mg followed in 30 minutes by Lasix 40 mg. She will do this today. She will also do this tomorrow. I will recheck the patient on Thursday.  I will also start her on Keflex 500 mg by mouth 3 times a day. If there is a DVT, the patient will also need to take Xarelto 15 mg by mouth twice a day. Await the results of the ultrasound. Follow-up on Thursday or sooner if worse

## 2016-07-26 ENCOUNTER — Ambulatory Visit: Payer: Medicare HMO | Admitting: Physician Assistant

## 2016-07-26 ENCOUNTER — Encounter: Payer: Self-pay | Admitting: Gynecology

## 2016-07-28 ENCOUNTER — Ambulatory Visit (INDEPENDENT_AMBULATORY_CARE_PROVIDER_SITE_OTHER): Payer: Medicare HMO | Admitting: Family Medicine

## 2016-07-28 VITALS — BP 120/82 | HR 100 | Temp 97.5°F | Resp 20

## 2016-07-28 DIAGNOSIS — M7989 Other specified soft tissue disorders: Secondary | ICD-10-CM | POA: Diagnosis not present

## 2016-07-28 DIAGNOSIS — L03115 Cellulitis of right lower limb: Secondary | ICD-10-CM | POA: Diagnosis not present

## 2016-07-28 NOTE — Progress Notes (Signed)
Subjective:    Patient ID: Dorothy Dyer, female    DOB: [birth year], 41 y.o.   MRN: [id number]  HPI  07/25/16 Since I last saw the patient, she has fallen and fractured her right clavicle. Apparently she is receiving tramadol for pain from her orthopedist, Dr. Veda Canning.  I reviewed the emergency room findings from the visit in April where she was diagnosed with a clavicle fracture. Patient states that she's been having significant swelling in both legs for several weeks. Recently developed a venous stasis ulcer this approximately 1.5 cm in diameter on the anterior aspect of her right shin. The skin has now become extremely red hot and painful. There is erythema streaking up from her ankle to her knee. However she reports pain shooting up her leg with ambulation from her calf to her mid medial thigh. She also has significant pain with very gentle palpation of her calf raising the concern for possible DVT. Swelling is much worse right leg compared to left leg. In fact the lower portions of her pants are wet due to serous drainage coming from her weeping pitting edema in both legs.  At that time, my plan was: Patient has chronic venous insufficiency with chronic pitting edema in both legs. However the swelling in her right leg is asymmetric. Given the redness, the warmth, and the pain, I'm concerned the patient may have a DVT. I will try to have her scheduled urgently for an ultrasound ruled out a DVT. At a bare minimum, the patient has cellulitis and needs antibiotics. She also needs diuresis to help with the swelling is both legs are tense with pitting edema.  I'll have her take Zaroxolyn 5 mg followed in 30 minutes by Lasix 40 mg. She will do this today. She will also do this tomorrow. I will recheck the patient on Thursday.  I will also start her on Keflex 500 mg by mouth 3 times a day. If there is a DVT, the patient will also need to take Xarelto 15 mg by mouth twice a day. Await the results of the  ultrasound. Follow-up on Thursday or sooner if worse  07/28/16 Ultrasound was negative for DVT. Therefore the patient never began Xarelto. Erythema and pain is much better after starting Keflex. However the patient still has substantial swelling in her right leg. She continues to have weeping edema coming from the right leg from a small 2-3 mm superficial ulcer.  Her pants are completely soaked from the weeping edema. She has been taking Zaroxolyn and Lasix as prescribed. Past Medical History:  Diagnosis Date  . Abdominal pain, chronic, generalized   . Acid reflux   . Anemia   . Anxiety   . Arthritis   . AVN of femur (HCC)    left hip  . Clotting disorder (HCC)    undetermined  . Cystitis   . 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 Hypertension   . Neuromuscular disorder (HCC)   . Osteoporosis   . Psoriasis   . Thyroid disease     Past Surgical History:  Procedure Laterality Date  . CHOLECYSTECTOMY    . ESOPHAGOGASTRODUODENOSCOPY  10/2014   at [facility], was normal. Dr. Merri Brunette  . GASTRIC BYPASS  05/2001  . SMALL INTESTINE SURGERY  2004   exploratory   Current Outpatient Prescriptions on File Prior to Visit  Medication Sig Dispense Refill  . acetaminophen (TYLENOL) 325 MG tablet Take 2 tablets (650 mg total) by mouth every 6 (six) hours as  needed for mild pain (or Fever >/= 101).    Marland Kitchen. ALPRAZolam (XANAX) 0.5 MG tablet TAKE 1 TABLET BY MOUTH TWICE A DAY AS NEEDED 60 tablet 1  . B Complex-C (B-COMPLEX WITH VITAMIN C) tablet Take 1 tablet by mouth daily. 30 tablet 0  . calcium-vitamin D (OSCAL WITH D) 500-200 MG-UNIT tablet Take 2 tablets by mouth daily with breakfast. 60 tablet 0  . cephALEXin (KEFLEX) 500 MG capsule Take 1 capsule (500 mg total) by mouth 3 (three) times daily. 30 capsule 0  . furosemide (LASIX) 40 MG tablet Take 1 tablet (40 mg total) by mouth daily as needed. (Patient taking differently: Take 40 mg by mouth daily as needed for fluid. ) 30 tablet 3  .  levothyroxine (SYNTHROID, LEVOTHROID) 125 MCG tablet TAKE 1 TABLET EVERY DAY (Patient taking differently: TAKE 125mg  TABLET by mouth  EVERY DAY) 90 tablet 0  . Multiple Vitamin (MULTIVITAMIN WITH MINERALS) TABS tablet Take 1 tablet by mouth 2 (two) times daily.    . nortriptyline (PAMELOR) 75 MG capsule TAKE ONE CAPSULE BY MOUTH EVERY EVENING AT BEDTIME 90 capsule 2  . omega-3 acid ethyl esters (LOVAZA) 1 g capsule Take 1 capsule (1 g total) by mouth daily. 30 capsule 0  . potassium chloride SA (K-DUR,KLOR-CON) 20 MEQ tablet Take 1 tablet (20 mEq total) by mouth 2 (two) times daily as needed (when taking lasix). 30 tablet 0  . pregabalin (LYRICA) 225 MG capsule TAKE 225mg  CAPSULE BY MOUTH TWICE A DAY 60 capsule 3  . protein supplement (UNJURY CHICKEN SOUP) POWD Take 27 g (8 oz total) by mouth 2 (two) times daily after a meal.    . RABEprazole (ACIPHEX) 20 MG tablet 1 tab po bid 60 tablet 5  . thiamine 100 MG tablet Take 1 tablet (100 mg total) by mouth daily. 30 tablet 0  . topiramate (TOPAMAX) 50 MG tablet TAKE 1 TABLET BY MOUTH AT BEDTIME (Patient taking differently: TAKE 50mg  TABLET BY MOUTH AT BEDTIME) 30 tablet 3  . traMADol (ULTRAM) 50 MG tablet Take 1 tablet (50 mg total) by mouth every 6 (six) hours as needed for moderate pain or severe pain. 12 tablet 0   No current facility-administered medications on file prior to visit.    Allergies  Allergen Reactions  . Ibuprofen Nausea Only and Other (See Comments)    Bad stomach pains  . Toradol [Ketorolac Tromethamine] Anxiety  . Aspirin Hives  . Cleocin [Clindamycin Hcl] Itching and Rash   Social History   Social History  . Marital status: Divorced    Spouse name: N/A  . Number of children: N/A  . Years of education: N/A   Occupational History  . Not on file.   Social History Main Topics  . Smoking status: Current Every Day Smoker    Packs/day: 0.50    Types: Cigarettes  . Smokeless tobacco: Never Used  . Alcohol use 6.0  oz/week    10 Shots of liquor per week  . Drug use: No  . Sexual activity: Yes    Birth control/ protection: Pill   Other Topics Concern  . Not on file   Social History Narrative  . No narrative on file    Review of Systems  All other systems reviewed and are negative.      Objective:   Physical Exam  Constitutional: She appears well-developed and well-nourished. No distress.  Cardiovascular: Normal rate, regular rhythm and normal heart sounds.   Pulmonary/Chest: Effort normal and  breath sounds normal. No respiratory distress. She has no wheezes. She has no rales.  Musculoskeletal: She exhibits edema and tenderness.  Skin: Skin is warm. She is not diaphoretic. There is erythema.  Vitals reviewed.      Assessment & Plan:  Cellulitis of right lower extremity  Right leg swelling Erythema has improved dramatically. Cellulitis is improving. Finish Keflex. Patient was placed in an [place] boot on her right leg. Recheck on Monday for dressing change. Discontinue Zaroxolyn but continue Lasix 40 mg a day

## 2016-07-31 ENCOUNTER — Other Ambulatory Visit: Payer: Self-pay | Admitting: Family Medicine

## 2016-07-31 ENCOUNTER — Encounter: Payer: Self-pay | Admitting: Family Medicine

## 2016-07-31 ENCOUNTER — Ambulatory Visit (INDEPENDENT_AMBULATORY_CARE_PROVIDER_SITE_OTHER): Payer: Medicare HMO | Admitting: Family Medicine

## 2016-07-31 VITALS — BP 126/80 | HR 106 | Temp 97.9°F | Ht 61.5 in | Wt 174.0 lb

## 2016-07-31 DIAGNOSIS — M7989 Other specified soft tissue disorders: Secondary | ICD-10-CM | POA: Diagnosis not present

## 2016-07-31 NOTE — Progress Notes (Signed)
Subjective:    Patient ID: Dorothy Dyer, female    DOB: 04/10/1975, 41 y.o.   MRN: [id number]  HPI  07/25/16 Since I last saw the patient, she has fallen and fractured her right clavicle. Apparently she is receiving tramadol for pain from her orthopedist, Dr. Veda CanningSwintek.  I reviewed the emergency room findings from the visit in April where she was diagnosed with a clavicle fracture. Patient states that she's been having significant swelling in both legs for several weeks. Recently developed a venous stasis ulcer this approximately 1.5 cm in diameter on the anterior aspect of her right shin. The skin has now become extremely red hot and painful. There is erythema streaking up from her ankle to her knee. However she reports pain shooting up her leg with ambulation from her calf to her mid medial thigh. She also has significant pain with very gentle palpation of her calf raising the concern for possible DVT. Swelling is much worse right leg compared to left leg. In fact the lower portions of her pants are wet due to serous drainage coming from her weeping pitting edema in both legs.  At that time, my plan was: Patient has chronic venous insufficiency with chronic pitting edema in both legs. However the swelling in her right leg is asymmetric. Given the redness, the warmth, and the pain, I'm concerned the patient may have a DVT. I will try to have her scheduled urgently for an ultrasound ruled out a DVT. At a bare minimum, the patient has cellulitis and needs antibiotics. She also needs diuresis to help with the swelling is both legs are tense with pitting edema.  I'll have her take Zaroxolyn 5 mg followed in 30 minutes by Lasix 40 mg. She will do this today. She will also do this tomorrow. I will recheck the patient on Thursday.  I will also start her on Keflex 500 mg by mouth 3 times a day. If there is a DVT, the patient will also need to take Xarelto 15 mg by mouth twice a day. Await the results of the  ultrasound. Follow-up on Thursday or sooner if worse  07/28/16 Ultrasound was negative for DVT. Therefore the patient never began Xarelto. Erythema and pain is much better after starting Keflex. However the patient still has substantial swelling in her right leg. She continues to have weeping edema coming from the right leg from a small 2-3 mm superficial ulcer.  Her pants are completely soaked from the weeping edema. She has been taking Zaroxolyn and Lasix as prescribed.  At that time, my plan was: Erythema has improved dramatically. Cellulitis is improving. Finish Keflex. Patient was placed in an [place] F[place]a boot on her right leg. Recheck on Monday for dressing change. Discontinue Zaroxolyn but continue Lasix 40 mg a day  07/31/16 Surprisingly, the patient's leg looks much better in the [employer] boot. There is no evidence of weeping edema through the [employer] boot. The swelling is much better. However the patient has no compression stockings with which to switch into after we remove the Unna boot Past Medical History:  Diagnosis Date  . Abdominal pain, chronic, generalized   . Acid reflux   . Anemia   . Anxiety   . Arthritis   . AVN of femur (HCC)    left hip  . Clotting disorder (HCC)    undetermined  . Cystitis   . 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 Hypertension   . Neuromuscular disorder (HCC)   . Osteoporosis   .  Psoriasis   . Thyroid disease     Past Surgical History:  Procedure Laterality Date  . CHOLECYSTECTOMY    . ESOPHAGOGASTRODUODENOSCOPY  10/2014   at [facility], was normal. Dr. Merri Brunette  . GASTRIC BYPASS  05/2001  . SMALL INTESTINE SURGERY  2004   exploratory   Current Outpatient Prescriptions on File Prior to Visit  Medication Sig Dispense Refill  . acetaminophen (TYLENOL) 325 MG tablet Take 2 tablets (650 mg total) by mouth every 6 (six) hours as needed for mild pain (or Fever >/= 101).    Marland Kitchen ALPRAZolam (XANAX) 0.5 MG tablet TAKE 1 TABLET BY MOUTH TWICE A DAY AS NEEDED 60  tablet 1  . B Complex-C (B-COMPLEX WITH VITAMIN C) tablet Take 1 tablet by mouth daily. 30 tablet 0  . calcium-vitamin D (OSCAL WITH D) 500-200 MG-UNIT tablet Take 2 tablets by mouth daily with breakfast. 60 tablet 0  . cephALEXin (KEFLEX) 500 MG capsule Take 1 capsule (500 mg total) by mouth 3 (three) times daily. 30 capsule 0  . furosemide (LASIX) 40 MG tablet Take 1 tablet (40 mg total) by mouth daily as needed. (Patient taking differently: Take 40 mg by mouth daily as needed for fluid. ) 30 tablet 3  . levothyroxine (SYNTHROID, LEVOTHROID) 125 MCG tablet TAKE 1 TABLET EVERY DAY (Patient taking differently: TAKE 125mg  TABLET by mouth  EVERY DAY) 90 tablet 0  . Multiple Vitamin (MULTIVITAMIN WITH MINERALS) TABS tablet Take 1 tablet by mouth 2 (two) times daily.    . nortriptyline (PAMELOR) 75 MG capsule TAKE ONE CAPSULE BY MOUTH EVERY EVENING AT BEDTIME 90 capsule 2  . omega-3 acid ethyl esters (LOVAZA) 1 g capsule Take 1 capsule (1 g total) by mouth daily. 30 capsule 0  . potassium chloride SA (K-DUR,KLOR-CON) 20 MEQ tablet Take 1 tablet (20 mEq total) by mouth 2 (two) times daily as needed (when taking lasix). 30 tablet 0  . pregabalin (LYRICA) 225 MG capsule TAKE 225mg  CAPSULE BY MOUTH TWICE A DAY 60 capsule 3  . protein supplement (UNJURY CHICKEN SOUP) POWD Take 27 g (8 oz total) by mouth 2 (two) times daily after a meal.    . RABEprazole (ACIPHEX) 20 MG tablet 1 tab po bid 60 tablet 5  . thiamine 100 MG tablet Take 1 tablet (100 mg total) by mouth daily. 30 tablet 0  . topiramate (TOPAMAX) 50 MG tablet TAKE 1 TABLET BY MOUTH AT BEDTIME (Patient taking differently: TAKE 50mg  TABLET BY MOUTH AT BEDTIME) 30 tablet 3  . traMADol (ULTRAM) 50 MG tablet Take 1 tablet (50 mg total) by mouth every 6 (six) hours as needed for moderate pain or severe pain. 12 tablet 0   No current facility-administered medications on file prior to visit.    Allergies  Allergen Reactions  . Ibuprofen Nausea Only and  Other (See Comments)    Bad stomach pains  . Toradol [Ketorolac Tromethamine] Anxiety  . Aspirin Hives  . Cleocin [Clindamycin Hcl] Itching and Rash   Social History   Social History  . Marital status: Divorced    Spouse name: N/A  . Number of children: N/A  . Years of education: N/A   Occupational History  . Not on file.   Social History Main Topics  . Smoking status: Current Every Day Smoker    Packs/day: 0.50    Types: Cigarettes  . Smokeless tobacco: Never Used  . Alcohol use 6.0 oz/week    10 Shots of liquor per week  .  Drug use: No  . Sexual activity: Yes    Birth control/ protection: Pill   Other Topics Concern  . Not on file   Social History Narrative  . No narrative on file    Review of Systems  All other systems reviewed and are negative.      Objective:   Physical Exam  Constitutional: She appears well-developed and well-nourished. No distress.  Cardiovascular: Normal rate, regular rhythm and normal heart sounds.   Pulmonary/Chest: Effort normal and breath sounds normal. No respiratory distress. She has no wheezes. She has no rales.  Musculoskeletal: She exhibits edema. She exhibits no tenderness.  Skin: Skin is warm. She is not diaphoretic. No erythema.  Vitals reviewed.      Assessment & Plan:  Right leg swelling - Plan: BASIC METABOLIC PANEL WITH GFR The edema is much improved. This is secondary to chronic venous insufficiency. Patient will need compression hose to prevent this from worsening essential remove the Unna boot. Therefore I will have her fitted for compression hose that are knee-high 15-20 mmHg. She will return on Friday to have the Unna boot removed bringing her compression hose with her so that we can replace the Unna boot with a compression hose. Continue Lasix 40 mg a day as a maintenance dose. Recheck a BMP to monitor her renal function and her potassium on this. I emphasized to the patient that she will need to continue compression  stockings indefinitely or else she will eventually have recurrent pitting edema in her legs leading to venous stasis ulcers and cellulitis.

## 2016-08-01 LAB — BASIC METABOLIC PANEL WITH GFR
BUN: 14 mg/dL (ref 7–25)
CALCIUM: 7.6 mg/dL — AB (ref 8.6–10.2)
CO2: 28 mmol/L (ref 20–31)
CREATININE: 0.7 mg/dL (ref 0.50–1.10)
Chloride: 106 mmol/L (ref 98–110)
GFR, Est African American: >89 mL/min (ref >=60)
GFR, Est Non African American: >89 mL/min (ref >=60)
GLUCOSE: 69 mg/dL — AB (ref 70–99)
Potassium: 3.4 mmol/L — ABNORMAL LOW (ref 3.5–5.3)
Sodium: 141 mmol/L (ref 135–146)

## 2016-08-04 ENCOUNTER — Ambulatory Visit: Payer: Medicare HMO | Admitting: Family Medicine

## 2016-08-04 LAB — MAGNESIUM: MAGNESIUM: 1.6 mg/dL (ref 1.5–2.5)

## 2016-08-09 ENCOUNTER — Ambulatory Visit (INDEPENDENT_AMBULATORY_CARE_PROVIDER_SITE_OTHER): Payer: Medicare HMO | Admitting: Physician Assistant

## 2016-08-09 ENCOUNTER — Encounter: Payer: Self-pay | Admitting: Physician Assistant

## 2016-08-09 VITALS — BP 108/72 | HR 100 | Temp 97.8°F | Resp 18

## 2016-08-09 DIAGNOSIS — M79601 Pain in right arm: Secondary | ICD-10-CM
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
## 2016-08-10 ENCOUNTER — Ambulatory Visit: Payer: Medicare HMO | Admitting: Family Medicine

## 2016-08-17 ENCOUNTER — Ambulatory Visit: Payer: Medicare HMO | Admitting: Family Medicine

## 2016-08-21 ENCOUNTER — Ambulatory Visit: Payer: Medicare HMO | Admitting: Family Medicine

## 2016-08-23 ENCOUNTER — Ambulatory Visit
Admission: RE | Admit: 2016-08-23 | Discharge: 2016-08-23 | Disposition: A | Discharge status: Home / Self Care | Payer: Medicare HMO | Source: Ambulatory Visit | Attending: Physician Assistant | Admitting: Physician Assistant

## 2016-08-23 DIAGNOSIS — M79621 Pain in right upper arm: Secondary | ICD-10-CM | POA: Diagnosis not present

## 2016-08-23 DIAGNOSIS — M79601 Pain in right arm: Secondary | ICD-10-CM

## 2016-08-24 ENCOUNTER — Ambulatory Visit: Payer: Medicare HMO | Admitting: Family Medicine

## 2016-09-02 ENCOUNTER — Other Ambulatory Visit: Payer: Self-pay | Admitting: Family Medicine

## 2016-09-02 DIAGNOSIS — R600 Localized edema: Secondary | ICD-10-CM

## 2016-09-04 ENCOUNTER — Encounter: Payer: Self-pay | Admitting: Family Medicine

## 2016-09-04 NOTE — Telephone Encounter (Signed)
Okay to give 1 refill 

## 2016-09-04 NOTE — Telephone Encounter (Signed)
Ok to refill 

## 2016-09-18 ENCOUNTER — Other Ambulatory Visit: Payer: Self-pay | Admitting: Family Medicine

## 2016-09-19 NOTE — Telephone Encounter (Signed)
Will need to wean off Xanax 0.5 mg 1/2 tab twice a day for 1 week, then 1/2 tab poqday for 1 week, then 1/2 tab poqod for 1 week (30)

## 2016-09-19 NOTE — Telephone Encounter (Signed)
Ok to refill??      (Dismissed but has [facility] until 10/08/16)

## 2016-09-21 NOTE — Telephone Encounter (Signed)
Pt aware via vm of decrease in medication

## 2016-09-26 ENCOUNTER — Other Ambulatory Visit: Payer: Self-pay | Admitting: Student

## 2016-09-26 DIAGNOSIS — M79601 Pain in right arm: Secondary | ICD-10-CM | POA: Diagnosis not present

## 2016-09-26 DIAGNOSIS — M7989 Other specified soft tissue disorders: Secondary | ICD-10-CM

## 2016-09-26 DIAGNOSIS — R69 Illness, unspecified: Secondary | ICD-10-CM | POA: Diagnosis not present

## 2016-09-26 DIAGNOSIS — G8929 Other chronic pain: Secondary | ICD-10-CM | POA: Diagnosis not present

## 2016-09-26 DIAGNOSIS — Z8781 Personal history of (healed) traumatic fracture: Secondary | ICD-10-CM | POA: Diagnosis not present

## 2016-09-26 DIAGNOSIS — M87052 Idiopathic aseptic necrosis of left femur: Secondary | ICD-10-CM | POA: Diagnosis not present

## 2016-09-27 ENCOUNTER — Emergency Department: Payer: Medicare HMO

## 2016-09-27 ENCOUNTER — Encounter: Payer: Self-pay | Admitting: Emergency Medicine

## 2016-09-27 ENCOUNTER — Ambulatory Visit
Admission: RE | Admit: 2016-09-27 | Discharge: 2016-09-27 | Disposition: A | Discharge status: Home / Self Care | Payer: Medicare HMO | Source: Ambulatory Visit | Attending: Student | Admitting: Student

## 2016-09-27 ENCOUNTER — Observation Stay
Admission: EM | Admit: 2016-09-27 | Discharge: 2016-09-29 | Disposition: A | Discharge status: Home / Self Care | Payer: Medicare HMO | Attending: Internal Medicine | Admitting: Internal Medicine

## 2016-09-27 DIAGNOSIS — F419 Anxiety disorder, unspecified: Secondary | ICD-10-CM | POA: Diagnosis not present

## 2016-09-27 DIAGNOSIS — Z791 Long term (current) use of non-steroidal anti-inflammatories (NSAID): Secondary | ICD-10-CM | POA: Diagnosis not present

## 2016-09-27 DIAGNOSIS — R296 Repeated falls: Secondary | ICD-10-CM | POA: Diagnosis not present

## 2016-09-27 DIAGNOSIS — E876 Hypokalemia: Secondary | ICD-10-CM | POA: Diagnosis not present

## 2016-09-27 DIAGNOSIS — M87852 Other osteonecrosis, left femur: Secondary | ICD-10-CM | POA: Insufficient documentation

## 2016-09-27 DIAGNOSIS — F17210 Nicotine dependence, cigarettes, uncomplicated: Secondary | ICD-10-CM | POA: Insufficient documentation

## 2016-09-27 DIAGNOSIS — R609 Edema, unspecified: Secondary | ICD-10-CM

## 2016-09-27 DIAGNOSIS — E039 Hypothyroidism, unspecified: Secondary | ICD-10-CM | POA: Diagnosis not present

## 2016-09-27 DIAGNOSIS — K219 Gastro-esophageal reflux disease without esophagitis: Secondary | ICD-10-CM | POA: Diagnosis not present

## 2016-09-27 DIAGNOSIS — S199XXA Unspecified injury of neck, initial encounter: Secondary | ICD-10-CM | POA: Diagnosis not present

## 2016-09-27 DIAGNOSIS — S42009A Fracture of unspecified part of unspecified clavicle, initial encounter for closed fracture: Secondary | ICD-10-CM

## 2016-09-27 DIAGNOSIS — S42021A Displaced fracture of shaft of right clavicle, initial encounter for closed fracture: Secondary | ICD-10-CM | POA: Diagnosis not present

## 2016-09-27 DIAGNOSIS — M84411A Pathological fracture, right shoulder, initial encounter for fracture: Secondary | ICD-10-CM | POA: Diagnosis not present

## 2016-09-27 DIAGNOSIS — Z79899 Other long term (current) drug therapy: Secondary | ICD-10-CM | POA: Insufficient documentation

## 2016-09-27 DIAGNOSIS — M25552 Pain in left hip: Secondary | ICD-10-CM | POA: Diagnosis not present

## 2016-09-27 DIAGNOSIS — Z9884 Bariatric surgery status: Secondary | ICD-10-CM | POA: Insufficient documentation

## 2016-09-27 DIAGNOSIS — R52 Pain, unspecified: Secondary | ICD-10-CM

## 2016-09-27 DIAGNOSIS — L4050 Arthropathic psoriasis, unspecified: Secondary | ICD-10-CM | POA: Diagnosis not present

## 2016-09-27 DIAGNOSIS — S0990XA Unspecified injury of head, initial encounter: Secondary | ICD-10-CM | POA: Diagnosis not present

## 2016-09-27 DIAGNOSIS — R0602 Shortness of breath: Secondary | ICD-10-CM | POA: Diagnosis not present

## 2016-09-27 DIAGNOSIS — W19XXXA Unspecified fall, initial encounter: Secondary | ICD-10-CM | POA: Insufficient documentation

## 2016-09-27 DIAGNOSIS — I10 Essential (primary) hypertension: Secondary | ICD-10-CM | POA: Insufficient documentation

## 2016-09-27 DIAGNOSIS — L409 Psoriasis, unspecified: Secondary | ICD-10-CM | POA: Diagnosis not present

## 2016-09-27 DIAGNOSIS — M7989 Other specified soft tissue disorders: Secondary | ICD-10-CM | POA: Insufficient documentation

## 2016-09-27 DIAGNOSIS — M542 Cervicalgia: Secondary | ICD-10-CM | POA: Insufficient documentation

## 2016-09-27 LAB — CBC WITH DIFFERENTIAL/PLATELET
BASOS ABS: 0.1 10*3/uL (ref 0–0.1)
Basophils Relative: 1 %
EOS ABS: 0.2 10*3/uL (ref 0–0.7)
EOS PCT: 3 %
HCT: 33 % — ABNORMAL LOW (ref 35.0–47.0)
Hemoglobin: 11.3 g/dL — ABNORMAL LOW (ref 12.0–16.0)
LYMPHS PCT: 30 %
Lymphs Abs: 1.8 10*3/uL (ref 1.0–3.6)
MCH: 36.1 pg — ABNORMAL HIGH (ref 26.0–34.0)
MCHC: 34.2 g/dL (ref 32.0–36.0)
MCV: 105.4 fL — ABNORMAL HIGH (ref 80.0–100.0)
Monocytes Absolute: 0.7 10*3/uL (ref 0.2–0.9)
Monocytes Relative: 12 %
NEUTROS PCT: 54 %
Neutro Abs: 3.4 10*3/uL (ref 1.4–6.5)
PLATELETS: 135 10*3/uL — AB (ref 150–440)
RBC: 3.14 MIL/uL — AB (ref 3.80–5.20)
RDW: 17.7 % — ABNORMAL HIGH (ref 11.5–14.5)
WBC: 6.2 10*3/uL (ref 3.6–11.0)

## 2016-09-27 LAB — COMPREHENSIVE METABOLIC PANEL
ALT: 39 U/L (ref 14–54)
AST: 72 U/L — ABNORMAL HIGH (ref 15–41)
Albumin: 1.8 g/dL — ABNORMAL LOW (ref 3.5–5.0)
Alkaline Phosphatase: 189 U/L — ABNORMAL HIGH (ref 38–126)
Anion gap: 9 (ref 5–15)
BUN: 9 mg/dL (ref 6–20)
CHLORIDE: 107 mmol/L (ref 101–111)
CO2: 27 mmol/L (ref 22–32)
CREATININE: 0.67 mg/dL (ref 0.44–1.00)
Calcium: 7.8 mg/dL — ABNORMAL LOW (ref 8.9–10.3)
GFR calc Af Amer: >60 mL/min (ref >60)
GFR calc non Af Amer: >60 mL/min (ref >60)
GLUCOSE: 81 mg/dL (ref 65–99)
Potassium: 3.1 mmol/L — ABNORMAL LOW (ref 3.5–5.1)
SODIUM: 143 mmol/L (ref 135–145)
Total Bilirubin: 2.2 mg/dL — ABNORMAL HIGH (ref 0.3–1.2)
Total Protein: 4.8 g/dL — ABNORMAL LOW (ref 6.5–8.1)

## 2016-09-27 LAB — PROTIME-INR
INR: 1.01
Prothrombin Time: 13.3 seconds (ref 11.4–15.2)

## 2016-09-27 LAB — APTT: aPTT: <24 seconds — ABNORMAL LOW (ref 24–36)

## 2016-09-27 MED ORDER — OXYCODONE-ACETAMINOPHEN 5-325 MG PO TABS
1.0000 | ORAL_TABLET | Freq: Four times a day (QID) | ORAL | Status: DC | PRN
  Administered 2016-09-27 – 2016-09-29 (×6): 1 via ORAL
  Filled 2016-09-27 (×6): qty 1

## 2016-09-27 MED ORDER — NAPROXEN SODIUM 275 MG PO TABS
275.0000 mg | ORAL_TABLET | Freq: Two times a day (BID) | ORAL | Status: DC | PRN
  Filled 2016-09-27: qty 1

## 2016-09-27 MED ORDER — ALPRAZOLAM 0.5 MG PO TABS: 0.5000 mg | ORAL_TABLET | Freq: Three times a day (TID) | ORAL | Status: DC | PRN

## 2016-09-27 MED ORDER — PANTOPRAZOLE SODIUM 40 MG PO TBEC
40.0000 mg | DELAYED_RELEASE_TABLET | Freq: Every day | ORAL | Status: DC
  Administered 2016-09-28 – 2016-09-29 (×2): 40 mg via ORAL
  Filled 2016-09-27 (×2): qty 1

## 2016-09-27 MED ORDER — PREGABALIN 75 MG PO CAPS
225.0000 mg | ORAL_CAPSULE | Freq: Two times a day (BID) | ORAL | Status: DC
  Administered 2016-09-27 – 2016-09-29 (×4): 225 mg via ORAL
  Filled 2016-09-27 (×4): qty 3

## 2016-09-27 MED ORDER — MORPHINE SULFATE (PF) 2 MG/ML IV SOLN
2.0000 mg | INTRAVENOUS | Status: DC | PRN
  Administered 2016-09-27 – 2016-09-29 (×4): 2 mg via INTRAVENOUS
  Filled 2016-09-27 (×4): qty 1

## 2016-09-27 MED ORDER — METOLAZONE 2.5 MG PO TABS
2.5000 mg | ORAL_TABLET | Freq: Every day | ORAL | Status: DC
  Administered 2016-09-27 – 2016-09-29 (×3): 2.5 mg via ORAL
  Filled 2016-09-27 (×3): qty 1

## 2016-09-27 MED ORDER — FENTANYL CITRATE (PF) 100 MCG/2ML IJ SOLN
25.0000 ug | Freq: Once | INTRAMUSCULAR | Status: AC
  Administered 2016-09-27: 25 ug via INTRAVENOUS
  Filled 2016-09-27: qty 2

## 2016-09-27 MED ORDER — LEVOTHYROXINE SODIUM 137 MCG PO TABS
137.0000 ug | ORAL_TABLET | ORAL | Status: DC
  Administered 2016-09-28: 137 ug via ORAL
  Filled 2016-09-27: qty 1

## 2016-09-27 MED ORDER — DOCUSATE SODIUM 100 MG PO CAPS: 100.0000 mg | ORAL_CAPSULE | Freq: Two times a day (BID) | ORAL | Status: DC | PRN

## 2016-09-27 MED ORDER — POTASSIUM CHLORIDE CRYS ER 20 MEQ PO TBCR
40.0000 meq | EXTENDED_RELEASE_TABLET | Freq: Once | ORAL | Status: AC
  Administered 2016-09-27: 40 meq via ORAL
  Filled 2016-09-27: qty 2

## 2016-09-27 MED ORDER — PREDNISONE 20 MG PO TABS
60.0000 mg | ORAL_TABLET | Freq: Once | ORAL | Status: AC
  Administered 2016-09-27: 60 mg via ORAL
  Filled 2016-09-27: qty 3

## 2016-09-27 MED ORDER — HEPARIN SODIUM (PORCINE) 5000 UNIT/ML IJ SOLN
5000.0000 [IU] | Freq: Three times a day (TID) | INTRAMUSCULAR | Status: DC
  Administered 2016-09-27 – 2016-09-29 (×5): 5000 [IU] via SUBCUTANEOUS
  Filled 2016-09-27 (×5): qty 1

## 2016-09-27 MED ORDER — NORTRIPTYLINE HCL 25 MG PO CAPS
50.0000 mg | ORAL_CAPSULE | Freq: Every day | ORAL | Status: DC
  Administered 2016-09-27: 50 mg via ORAL
  Filled 2016-09-27 (×2): qty 2

## 2016-09-27 MED ORDER — NAPROXEN 250 MG PO TABS
250.0000 mg | ORAL_TABLET | Freq: Two times a day (BID) | ORAL | Status: DC | PRN
  Filled 2016-09-27: qty 1

## 2016-09-27 MED ORDER — LEVOTHYROXINE SODIUM 50 MCG PO TABS
125.0000 ug | ORAL_TABLET | ORAL | Status: DC
  Administered 2016-09-29: 125 ug via ORAL
  Filled 2016-09-27 (×2): qty 3

## 2016-09-27 MED ORDER — TOPIRAMATE 25 MG PO TABS
50.0000 mg | ORAL_TABLET | Freq: Every day | ORAL | Status: DC
  Administered 2016-09-27 – 2016-09-28 (×2): 50 mg via ORAL
  Filled 2016-09-27 (×3): qty 2

## 2016-09-27 MED ORDER — HYDROMORPHONE HCL 1 MG/ML IJ SOLN
0.5000 mg | Freq: Once | INTRAMUSCULAR | Status: AC
  Administered 2016-09-27: 0.5 mg via INTRAVENOUS
  Filled 2016-09-27: qty 1

## 2016-09-27 NOTE — ED Triage Notes (Signed)
Pt was here to get US of RUE to r/o DVT r/t swelling.  Right arm swollen from elbow down.  Swelling present X 3 weeks.  Also has pain in right arm.  Would like arm seen today as well.  Pt has frequent falls. Has difficulty walking because needs a hip replacement but has not had.  Larey SeatFell today walking with walking with walker.  Did hit head, no LOC.  Has chronic neck pain no worse pain.

## 2016-09-27 NOTE — Progress Notes (Signed)
CSW has initiated SNF referral process. CSW to update unit CSW of discharge plan. Patient pending bed offers and PT consult at this time.    Enos FlingAshley Ramiz Turpin, MSW, LCSW [facility] Clinical Social Worker [phone]

## 2016-09-27 NOTE — ED Notes (Signed)
Pt taken to us via w/c

## 2016-09-27 NOTE — NC FL2 (Signed)
[place] MEDICAID FL2 LEVEL OF CARE SCREENING TOOL     IDENTIFICATION  Patient Name: Dorothy Dyer Birthdate: 02/14/1976 Sex: female Admission Date (Current Location): 09/27/2016  [place]   Facility and Address:  [facility], [address], [place], [place] [address]      Provider Number: [id number]  Attending Physician Name and Address:  Altamese DillingVachhani, Vaibhavkumar, *  Relative Name and Phone Number:  Gwendolyn LimaJeremy Cordova [facility](Brother) [phone]    Current Level of Care: Hospital Recommended Level of Care: Skilled Nursing Facility Prior Approval Number:    Date Approved/Denied:   PASRR Number:    Discharge Plan: SNF    Current Diagnoses: Patient Active Problem List   Diagnosis Date Noted  . Pain 09/27/2016  . Clavicular fracture 09/27/2016  . Abdominal pain, chronic, generalized   . Protein-calorie malnutrition, severe 06/02/2016  . Acute encephalopathy 06/01/2016  . Chronic left hip pain 06/01/2016  . Overdose of opiate or related narcotic, accidental or unintentional, initial encounter 06/01/2016  . Lower extremity cellulitis 06/01/2016  . 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 Left knee pain 06/01/2016  . Bilateral lower extremity edema 06/01/2016  . Acute hypokalemia 02/11/2016  . Hypoglycemia 02/11/2016  . Marijuana abuse 02/11/2016  . Chronic narcotic use 02/11/2016  . AKI (acute kidney injury) (HCC) 02/10/2016  . AVN of femur (HCC)   . 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 Hoarseness 06/17/2014  . 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 Fibromyalgia 06/17/2014  . Chronic abdominal pain 06/17/2014  . Knee pain, bilateral 06/17/2014  . Vitamin D deficiency 06/17/2014  . Vitamin B12 deficiency 06/17/2014  . Iron deficiency anemia 06/17/2014  . Thyroid activity decreased 06/17/2014  . Abnormal Pap smear of cervix 06/17/2014  . Anemia   . Anxiety   . Arthritis   . Acid reflux   . Neuromuscular disorder (HCC)   .  Hypertension   . Osteoporosis   . 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)                       Personal Care Assistance Level of Assistance  Bathing, Feeding, Dressing Bathing Assistance: Limited assistance Feeding assistance: Independent Dressing Assistance: Limited assistance     Functional Limitations Info  Sight, Speech, Hearing Sight Info: Adequate Hearing Info: Adequate Speech Info: Adequate    SPECIAL CARE FACTORS FREQUENCY  PT (By licensed PT)                    Contractures Contractures Info: Not present    Additional Factors Info  Code Status, Allergies, Psychotropic Code Status Info: FULL Allergies Info:  Ibuprofen, Toradol Ketorolac Tromethamine, Aspirin, Cleocin Clindamycin Hcl Psychotropic Info: Xanax         Current Medications (09/27/2016):  This is the current hospital active medication list Current Facility-Administered Medications  Medication Dose Route Frequency Provider Last Rate Last Dose  . morphine 2 MG/ML injection 2 mg  2 mg Intravenous Q4H PRN Altamese DillingVachhani, Vaibhavkumar, MD      . oxyCODONE-acetaminophen (PERCOCET/ROXICET) 5-325 MG per tablet  1 tablet  1 tablet Oral Q6H PRN Altamese Dilling, MD       Current Outpatient Prescriptions  Medication Sig Dispense Refill  . ALPRAZolam (XANAX) 0.5 MG tablet 1/2 tab twice a day for 1 week, then 1/2 tab poqday for 1 week, then 1/2 tab poqod for 1 week (Patient taking differently: Take 0.5 mg by mouth 3 (three) times daily as needed for anxiety. ) 30 tablet 0  . furosemide (LASIX) 40 MG tablet  Take 1 tablet (40 mg total) by mouth daily as needed. (Patient taking differently: Take 40 mg by mouth daily as needed for fluid. ) 30 tablet 3  . levothyroxine (SYNTHROID, LEVOTHROID) 125 MCG tablet TAKE 1 TABLET EVERY DAY (Patient taking differently: Take 125 mcg by mouth every other day, alternating with 137 mcg tablet.) 90 tablet 0  . levothyroxine (SYNTHROID, LEVOTHROID) 137 MCG tablet Take 137 mcg by mouth every other day. Alternating with 125 mcg tablet.    Marland Kitchen LYRICA 225 MG capsule TAKE 1 CAPSULE BY MOUTH TWICE A DAY 60 capsule 0  . metolazone (ZAROXOLYN) 5 MG tablet TAKE 1 TABLET BY MOUTH EVERY DAY 30 MINS BEFORE LASIX 1-2 TIMES PER WEEK. 30 tablet 0  . naproxen sodium (ANAPROX) 220 MG tablet Take 220 mg by mouth 2 (two) times daily with a meal.    . nortriptyline (PAMELOR) 75 MG capsule TAKE ONE CAPSULE BY MOUTH EVERY EVENING AT BEDTIME 90 capsule 2  . potassium chloride SA (K-DUR,KLOR-CON) 20 MEQ tablet Take 1 tablet (20 mEq total) by mouth 2 (two) times daily as needed (when taking lasix). (Patient taking differently: Take 20 mEq by mouth daily. ) 30 tablet 0  . RABEprazole (ACIPHEX) 20 MG tablet 1 tab po bid (Patient taking differently: Take 20 mg by mouth daily. ) 60 tablet 5  . topiramate (TOPAMAX) 50 MG tablet TAKE 1 TABLET BY MOUTH AT BEDTIME (Patient taking differently: TAKE 50mg  TABLET BY MOUTH AT BEDTIME) 30 tablet 3  . acetaminophen (TYLENOL) 325 MG tablet Take 2 tablets (650 mg total) by mouth every 6 (six) hours as needed for mild pain (or Fever >/= 101). (Patient not taking: Reported on 09/27/2016)    . B Complex-C (B-COMPLEX WITH VITAMIN C) tablet Take 1 tablet by mouth daily. (Patient not taking: Reported on 09/27/2016) 30 tablet 0  . calcium-vitamin D (OSCAL WITH D) 500-200 MG-UNIT tablet Take 2 tablets by mouth daily with breakfast. (Patient not taking: Reported on 09/27/2016) 60 tablet 0  . cephALEXin (KEFLEX) 500 MG capsule Take 1 capsule (500 mg total) by mouth 3 (three) times  daily. (Patient not taking: Reported on 09/27/2016) 30 capsule 0  . Multiple Vitamin (MULTIVITAMIN WITH MINERALS) TABS tablet Take 1 tablet by mouth 2 (two) times daily. (Patient not taking: Reported on 09/27/2016)    . omega-3 acid ethyl esters (LOVAZA) 1 g capsule Take 1 capsule (1 g total) by mouth daily. (Patient not taking: Reported on 09/27/2016) 30 capsule 0  . protein supplement (UNJURY CHICKEN SOUP) POWD Take 27 g (8 oz total) by mouth 2 (two) times daily after a meal. (Patient not taking: Reported on 09/27/2016)    . thiamine 100 MG tablet Take 1 tablet (100 mg total) by mouth daily. (Patient not taking: Reported on 09/27/2016) 30 tablet 0  . traMADol (ULTRAM) 50 MG tablet Take 1 tablet (50 mg total) by mouth every 6 (six) hours as needed for moderate pain or severe pain. (Patient not taking: Reported on 09/27/2016) 12 tablet 0  Discharge Medications: Please see discharge summary for a list of discharge medications.  Relevant Imaging Results:  Relevant Lab Results:   Additional Information SS#: [id number]  Lew Dawes, LCSW

## 2016-09-27 NOTE — ED Notes (Signed)
First nurse note   [facility]  States she fell and hit her head in medical arts  states she was coming for u/s of her arm and became dizzy

## 2016-09-27 NOTE — H&P (Signed)
[facility] [place] [facility]   PATIENT NAME: Dorothy Dyer    MR#:  [id number]  DATE OF BIRTH:  August 20, 1975  DATE OF ADMISSION:  09/27/2016  PRIMARY CARE PHYSICIAN: Donita Brooks, MD   REQUESTING/REFERRING PHYSICIAN: Sharma Covert  CHIEF COMPLAINT:   Chief Complaint  Patient presents with  . Fall  . Arm Swelling    HISTORY OF PRESENT ILLNESS: Dorothy Dyer  is a 41 y.o. 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 She does not have dental insurance issue is working with [facility] and they told she has total 11 teeth which need some workup and some of them need to be pulled out she is working with them by multiple appointments to take care of that. Meanwhile she had a fall and a right clavicular fracture a few weeks ago since then she is hurting and she could not walk properly because she has to use her arm to walk with a walker. She came to emergency room as she has swelling on her right upper extremity and also both her legs are swollen with some blisters on them. ER physician spoke to social services and they are working on getting her rehabilitation. Meanwhile because of her arm swelling and fracture she had pain for that she required repeated IV pain medications so she suggested to admit her to help with the pain control.  PAST MEDICAL HISTORY:   Past Medical History:  Diagnosis Date  . Abdominal pain, chronic, generalized   . Acid reflux   . Anemia   . Anxiety   . Arthritis   . AVN of femur (HCC)    left hip  . Clotting disorder (HCC)    undetermined  . Cystitis   . 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 Hypertension   . Neuromuscular disorder (HCC)   . Osteoporosis   .  Psoriasis   . Thyroid disease     PAST SURGICAL HISTORY: Past Surgical History:  Procedure Laterality Date  . CHOLECYSTECTOMY    . ESOPHAGOGASTRODUODENOSCOPY  10/2014   at [facility], was normal. Dr. Merri Brunette  . GASTRIC BYPASS  05/2001  . SMALL INTESTINE SURGERY  2004   exploratory    SOCIAL HISTORY:  Social History  Substance Use Topics  . Smoking status: Current Every Day Smoker    Packs/day: 0.50    Types: Cigarettes  . Smokeless tobacco: Never Used  . Alcohol use 6.0 oz/week    10 Shots of liquor per week    FAMILY HISTORY:  Family History  Problem Relation Age of Onset  . Depression Mother   . Diabetes Mother   . Hyperlipidemia Mother   . Depression Father   . Hearing loss Father   . Hyperlipidemia Father   . Hypertension Father   . Learning disabilities Father   . Mental illness Father   . Asthma Brother   . Alcohol abuse Maternal Grandmother   . Arthritis Maternal Grandmother   . Cancer Maternal Grandmother   . Cancer Maternal Grandfather   . Mental illness Paternal Grandmother   . Heart disease Paternal Grandfather   . Stroke Paternal Grandfather     DRUG ALLERGIES:  Allergies  Allergen Reactions  . Ibuprofen Nausea Only and  Other (See Comments)    Bad stomach pains  . Toradol [Ketorolac Tromethamine] Anxiety  . Aspirin Hives  . Cleocin [Clindamycin Hcl] Itching and Rash    REVIEW OF SYSTEMS:   CONSTITUTIONAL: No fever,Positive for fatigue or weakness.  EYES: No blurred or double vision.  EARS, NOSE, AND THROAT: No tinnitus or ear pain.  RESPIRATORY: No cough, shortness of breath, wheezing or hemoptysis.  CARDIOVASCULAR: No chest pain, orthopnea, edema.  GASTROINTESTINAL: No nausea, vomiting, diarrhea or abdominal pain.  GENITOURINARY: No dysuria, hematuria.  ENDOCRINE: No polyuria, nocturia,  HEMATOLOGY: No anemia, easy bruising or bleeding SKIN: No rash or lesion. MUSCULOSKELETAL: Left hip joint and right shoulder pain and right arm  swelling. NEUROLOGIC: No tingling, numbness, weakness.  PSYCHIATRY: No anxiety or depression.   MEDICATIONS AT HOME:  Prior to Admission medications   Medication Sig Start Date End Date Taking? Authorizing Provider  ALPRAZolam Prudy Feeler) 0.5 MG tablet 1/2 tab twice a day for 1 week, then 1/2 tab poqday for 1 week, then 1/2 tab poqod for 1 week Patient taking differently: Take 0.5 mg by mouth 3 (three) times daily as needed for anxiety.  09/21/16  Yes Donita Brooks, MD  furosemide (LASIX) 40 MG tablet Take 1 tablet (40 mg total) by mouth daily as needed. Patient taking differently: Take 40 mg by mouth daily as needed for fluid.  03/31/16  Yes Donita Brooks, MD  levothyroxine (SYNTHROID, LEVOTHROID) 125 MCG tablet TAKE 1 TABLET EVERY DAY Patient taking differently: Take 125 mcg by mouth every other day, alternating with 137 mcg tablet. 11/25/15  Yes Donita Brooks, MD  levothyroxine (SYNTHROID, LEVOTHROID) 137 MCG tablet Take 137 mcg by mouth every other day. Alternating with 125 mcg tablet.   Yes [provider]  LYRICA 225 MG capsule TAKE 1 CAPSULE BY MOUTH TWICE A DAY 09/05/16  Yes Donita Brooks, MD  metolazone (ZAROXOLYN) 5 MG tablet TAKE 1 TABLET BY MOUTH EVERY DAY 30 MINS BEFORE LASIX 1-2 TIMES PER WEEK. 09/05/16  Yes Donita Brooks, MD  naproxen sodium (ANAPROX) 220 MG tablet Take 220 mg by mouth 2 (two) times daily with a meal.   Yes [provider]  nortriptyline (PAMELOR) 75 MG capsule TAKE ONE CAPSULE BY MOUTH EVERY EVENING AT BEDTIME 06/26/16  Yes Donita Brooks, MD  potassium chloride SA (K-DUR,KLOR-CON) 20 MEQ tablet Take 1 tablet (20 mEq total) by mouth 2 (two) times daily as needed (when taking lasix). Patient taking differently: Take 20 mEq by mouth daily.  06/03/16  Yes Hongalgi, Maximino Greenland, MD  RABEprazole (ACIPHEX) 20 MG tablet 1 tab po bid Patient taking differently: Take 20 mg by mouth daily.  06/29/16  Yes Donita Brooks, MD  topiramate (TOPAMAX)  50 MG tablet TAKE 1 TABLET BY MOUTH AT BEDTIME Patient taking differently: TAKE 50mg  TABLET BY MOUTH AT BEDTIME 01/31/16  Yes Donita Brooks, MD  acetaminophen (TYLENOL) 325 MG tablet Take 2 tablets (650 mg total) by mouth every 6 (six) hours as needed for mild pain (or Fever >/= 101). Patient not taking: Reported on 09/27/2016 06/03/16   Elease Etienne, MD  B Complex-C (B-COMPLEX WITH VITAMIN C) tablet Take 1 tablet by mouth daily. Patient not taking: Reported on 09/27/2016 06/04/16   Elease Etienne, MD  calcium-vitamin D (OSCAL WITH D) 500-200 MG-UNIT tablet Take 2 tablets by mouth daily with breakfast. Patient not taking: Reported on 09/27/2016 06/04/16   Elease Etienne, MD  cephALEXin (KEFLEX) 500  MG capsule Take 1 capsule (500 mg total) by mouth 3 (three) times daily. Patient not taking: Reported on 09/27/2016 07/25/16   Donita BrooksPickard, Warren T, MD  Multiple Vitamin (MULTIVITAMIN WITH MINERALS) TABS tablet Take 1 tablet by mouth 2 (two) times daily. Patient not taking: Reported on 09/27/2016 06/04/16   Elease EtienneHongalgi, Anand D, MD  omega-3 acid ethyl esters (LOVAZA) 1 g capsule Take 1 capsule (1 g total) by mouth daily. Patient not taking: Reported on 09/27/2016 06/04/16   Elease EtienneHongalgi, Anand D, MD  protein supplement [facility](UNJURY CHICKEN SOUP) POWD Take 27 g (8 oz total) by mouth 2 (two) times daily after a meal. Patient not taking: Reported on 09/27/2016 06/04/16   Elease EtienneHongalgi, Anand D, MD  thiamine 100 MG tablet Take 1 tablet (100 mg total) by mouth daily. Patient not taking: Reported on 09/27/2016 06/04/16   Elease EtienneHongalgi, Anand D, MD  traMADol (ULTRAM) 50 MG tablet Take 1 tablet (50 mg total) by mouth every 6 (six) hours as needed for moderate pain or severe pain. Patient not taking: Reported on 09/27/2016 06/03/16   Elease EtienneHongalgi, Anand D, MD      PHYSICAL EXAMINATION:   VITAL SIGNS: Blood pressure 109/80, pulse 87, temperature (!) 97.5 F (36.4 C), temperature source Oral, resp. rate 18, height 5\' 1"  (1.549 m), weight  86.2 kg (190 lb), SpO2 100 %.  GENERAL:  41 y.o.-year-old patient lying in the bed with no acute distress.  EYES: Pupils equal, round, reactive to light and accommodation. No scleral icterus. Extraocular muscles intact.  HEENT: Head atraumatic, normocephalic. Oropharynx and nasopharynx clear.  NECK:  Supple, no jugular venous distention. No thyroid enlargement, no tenderness.  LUNGS: Normal breath sounds bilaterally, no wheezing, rales,rhonchi or crepitation. No use of accessory muscles of respiration.  CARDIOVASCULAR: S1, S2 normal. No murmurs, rubs, or gallops.  ABDOMEN: Soft, nontender, nondistended. Bowel sounds present. No organomegaly or mass.  EXTREMITIES: Bilateral chronic pedal edema, no cyanosis, or clubbing. Right upper extremity is swollen no redness. NEUROLOGIC: Cranial nerves II through XII are intact. Muscle strength 4 /5 in all extremities. Sensation intact. Gait not checked.  PSYCHIATRIC: The patient is alert and oriented x 3.  SKIN: Psoriatic lesions on her right upper extremity spatially and, joints changes with some atrophic findings on her finger joints both hands. She has chronic appearing edema on both legs with some superficial blisters. No redness.   LABORATORY PANEL:   CBC  Recent Labs Lab 09/27/16 0953  WBC 6.2  HGB 11.3*  HCT 33.0*  PLT 135*  MCV 105.4*  MCH 36.1*  MCHC 34.2  RDW 17.7*  LYMPHSABS 1.8  MONOABS 0.7  EOSABS 0.2  BASOSABS 0.1   ------------------------------------------------------------------------------------------------------------------  Chemistries   Recent Labs Lab 09/27/16 0953  NA 143  K 3.1*  CL 107  CO2 27  GLUCOSE 81  BUN 9  CREATININE 0.67  CALCIUM 7.8*  AST 72*  ALT 39  ALKPHOS 189*  BILITOT 2.2*   ------------------------------------------------------------------------------------------------------------------ estimated creatinine clearance is 91.4 mL/min (by C-G formula based on SCr of 0.67  mg/dL). ------------------------------------------------------------------------------------------------------------------ No results for input(s): TSH, T4TOTAL, T3FREE, THYROIDAB in the last 72 hours.  Invalid input(s): FREET3   Coagulation profile  Recent Labs Lab 09/27/16 0953  INR 1.01   ------------------------------------------------------------------------------------------------------------------- No results for input(s): DDIMER in the last 72 hours. -------------------------------------------------------------------------------------------------------------------  Cardiac Enzymes No results for input(s): CKMB, TROPONINI, MYOGLOBIN in the last 168 hours.  Invalid input(s): CK ------------------------------------------------------------------------------------------------------------------ Invalid input(s): POCBNP  ---------------------------------------------------------------------------------------------------------------  Urinalysis    Component Value  Date/Time   COLORURINE YELLOW 06/01/2016 1145   APPEARANCEUR CLEAR 06/01/2016 1145   LABSPEC 1.009 06/01/2016 1145   PHURINE 6.0 06/01/2016 1145   GLUCOSEU NEGATIVE 06/01/2016 1145   HGBUR NEGATIVE 06/01/2016 1145   BILIRUBINUR NEGATIVE 06/01/2016 1145   KETONESUR 5 (A) 06/01/2016 1145   PROTEINUR NEGATIVE 06/01/2016 1145   UROBILINOGEN 1.0 06/18/2014 1316   NITRITE POSITIVE (A) 06/01/2016 1145   LEUKOCYTESUR NEGATIVE 06/01/2016 1145     RADIOLOGY: Dg Chest 2 View  Result Date: 09/27/2016 CLINICAL DATA:  41 year old female with shortness of breath. Clavicle fracture a weeks ago. EXAM: CHEST  2 VIEW COMPARISON:  06/01/2016 chest radiographs. FINDINGS: Seated AP and lateral views of the chest. Midshaft right clavicle fracture with periosteal new bone and callus formation. Other visible osseous structures appear intact. Mildly larger lung volumes. The lungs appear clear. No pneumothorax or pleural effusion. Normal  cardiac size and mediastinal contours. Visualized tracheal air column is within normal limits. Negative visible bowel gas pattern. IMPRESSION: 1. Midshaft right clavicle fracture with periosteal new bone formation. 2. Otherwise negative chest. Electronically Signed   By: Odessa Fleming M.D.   On: 09/27/2016 14:03   Ct Head Wo Contrast  Result Date: 09/27/2016 CLINICAL DATA:  Fall today. Hit head. Chronic neck pain. Initial encounter. EXAM: CT HEAD WITHOUT CONTRAST CT CERVICAL SPINE WITHOUT CONTRAST TECHNIQUE: Multidetector CT imaging of the head and cervical spine was performed following the standard protocol without intravenous contrast. Multiplanar CT image reconstructions of the cervical spine were also generated. COMPARISON:  Head CT 06/01/2016. Right clavicle radiographs 07/06/2016. FINDINGS: CT HEAD FINDINGS Brain: There is no evidence of acute infarct, intracranial hemorrhage, mass, midline shift, or extra-axial fluid collection. The ventricles and sulci are normal. Vascular: No hyperdense vessel. Skull: No fracture or focal osseous lesion. Sinuses/Orbits: Mild left sphenoid sinus mucosal thickening. Clear mastoid air cells. Unremarkable orbits. Other: None. CT CERVICAL SPINE FINDINGS Alignment: Cervical spine straightening.  No listhesis. Skull base and vertebrae: No acute fracture or destructive osseous process. Soft tissues and spinal canal: No prevertebral fluid or swelling. No visible canal hematoma. Disc levels: Minimal disc space narrowing and minimal right uncovertebral spurring at C5-6. Minimal left facet arthrosis at C3-4. No osseous spinal or neural foraminal stenosis. Upper chest: Clear lung apices. Other: The nondisplaced mid right clavicle fracture described on prior radiographs now demonstrates approximately 1.3 cm posterior displacement of the main lateral fragment as well as mild inferior displacement and overriding with surrounding callus formation and likely old hematoma. IMPRESSION: 1. No  evidence of acute intracranial abnormality. 2. No cervical spine fracture or subluxation. 3. Subacute right mid clavicle fracture with new displacement compared to 06/2016 radiographs. Electronically Signed   By: Sebastian Ache M.D.   On: 09/27/2016 11:12   Ct Cervical Spine Wo Contrast  Result Date: 09/27/2016 CLINICAL DATA:  Fall today. Hit head. Chronic neck pain. Initial encounter. EXAM: CT HEAD WITHOUT CONTRAST CT CERVICAL SPINE WITHOUT CONTRAST TECHNIQUE: Multidetector CT imaging of the head and cervical spine was performed following the standard protocol without intravenous contrast. Multiplanar CT image reconstructions of the cervical spine were also generated. COMPARISON:  Head CT 06/01/2016. Right clavicle radiographs 07/06/2016. FINDINGS: CT HEAD FINDINGS Brain: There is no evidence of acute infarct, intracranial hemorrhage, mass, midline shift, or extra-axial fluid collection. The ventricles and sulci are normal. Vascular: No hyperdense vessel. Skull: No fracture or focal osseous lesion. Sinuses/Orbits: Mild left sphenoid sinus mucosal thickening. Clear mastoid air cells. Unremarkable orbits. Other: None. CT CERVICAL  SPINE FINDINGS Alignment: Cervical spine straightening.  No listhesis. Skull base and vertebrae: No acute fracture or destructive osseous process. Soft tissues and spinal canal: No prevertebral fluid or swelling. No visible canal hematoma. Disc levels: Minimal disc space narrowing and minimal right uncovertebral spurring at C5-6. Minimal left facet arthrosis at C3-4. No osseous spinal or neural foraminal stenosis. Upper chest: Clear lung apices. Other: The nondisplaced mid right clavicle fracture described on prior radiographs now demonstrates approximately 1.3 cm posterior displacement of the main lateral fragment as well as mild inferior displacement and overriding with surrounding callus formation and likely old hematoma. IMPRESSION: 1. No evidence of acute intracranial abnormality. 2.  No cervical spine fracture or subluxation. 3. Subacute right mid clavicle fracture with new displacement compared to 06/2016 radiographs. Electronically Signed   By: Sebastian Ache M.D.   On: 09/27/2016 11:12   US Venous Img Upper Uni Right  Result Date: 09/27/2016 CLINICAL DATA:  Right upper extremity swelling EXAM: RIGHT UPPER EXTREMITY VENOUS DOPPLER ULTRASOUND TECHNIQUE: Gray-scale sonography with graded compression, as well as color Doppler and duplex ultrasound were performed to evaluate the upper extremity deep venous system from the level of the subclavian vein and including the jugular, axillary, basilic, radial, ulnar and upper cephalic vein. Spectral Doppler was utilized to evaluate flow at rest and with distal augmentation maneuvers. COMPARISON:  None. FINDINGS: Contralateral Subclavian Vein: Respiratory phasicity is normal and symmetric with the symptomatic side. No evidence of thrombus. Normal compressibility. Internal Jugular Vein: No evidence of thrombus. Normal compressibility, respiratory phasicity and response to augmentation. Subclavian Vein: No evidence of thrombus. Normal compressibility, respiratory phasicity and response to augmentation. Axillary Vein: No evidence of thrombus. Normal compressibility, respiratory phasicity and response to augmentation. Cephalic Vein: No evidence of thrombus. Normal compressibility, respiratory phasicity and response to augmentation. Basilic Vein: No evidence of thrombus. Normal compressibility, respiratory phasicity and response to augmentation. Brachial Veins: No evidence of thrombus. Normal compressibility, respiratory phasicity and response to augmentation. Radial Veins: No evidence of thrombus. Normal compressibility, respiratory phasicity and response to augmentation. Ulnar Veins: No evidence of thrombus. Normal compressibility, respiratory phasicity and response to augmentation. Venous Reflux:  None visualized. Other Findings:  None visualized.  IMPRESSION: No evidence of DVT within the right upper extremity. Electronically Signed   By: Alcide Clever M.D.   On: 09/27/2016 12:05    EKG: Orders placed or performed during the hospital encounter of 06/01/16  . EKG 12-Lead  . EKG 12-Lead  . EKG 12-Lead  . EKG 12-Lead  . EKG 12-Lead  . EKG 12-Lead  . EKG  . EKG    IMPRESSION AND PLAN:  * Clavicular fracture   Right upper extremity swelling   Pain management    DVD studies negative in right upper extremity   Pain control with morphine and Percocet as needed.   Physical therapy evaluation.  * Psoriatic arthritis   Psoriatic skin changes.    Rheumatology consult.  * Hypothyroidism   Continue levothyroxine.  * Active smoking    Counseled to quit smoking for 4 minutes.  All the records are reviewed and case discussed with ED provider. Management plans discussed with the patient, family and they are in agreement.  CODE STATUS: Full code. Code Status History    Date Active Date Inactive Code Status Order ID Comments User Context   06/01/2016  6:34 PM 06/03/2016 10:42 PM Full Code [id number]  Russella Dar, NP Inpatient   02/11/2016  2:22 AM 02/11/2016 10:15 PM Full Code  [id number]  Eduard Clos, MD Inpatient       TOTAL TIME TAKING CARE OF THIS PATIENT: 50 minutes.    Altamese Dilling M.D on 09/27/2016   Between 7am to 6pm - Pager - [phone]  After 6pm go to [email or web address] - password [email or web address]  [facility]  Office  [phone]  CC: Primary care physician; Donita Brooks, MD   Note: This dictation was prepared with Dragon dictation along with smaller phrase technology. Any transcriptional errors that result from this process are unintentional.

## 2016-09-27 NOTE — Clinical Social Work Note (Signed)
Clinical Social Work Assessment  Patient Details  Name: Dorothy PeersCrystal Gabor MRN: [id number] Date of Birth: 06/29/1975  Date of referral:  09/27/16               Reason for consult:  Facility Placement                Permission sought to share information with:  Family Supports Permission granted to share information::  Yes, Verbal Permission Granted  Name::      Dorothy LimaJeremy Dyer (Brother) [phone]  Agency::     Relationship::     Contact Information:     Housing/Transportation Living arrangements for the past 2 months:  Mobile Home Source of Information:  Patient Patient Interpreter Needed:  None Criminal Activity/Legal Involvement Pertinent to Current Situation/Hospitalization:  No - Comment as needed Significant Relationships:  Parents, Siblings, Dependent Children Lives with:  Self, Minor Children Do you feel safe going back to the place where you live?  No Need for family participation in patient care:  Yes (Comment)  Care giving concerns:  MD is concerned that the patient has a right clavicle fracture and left hip AVN, making it impossible for her to safely treat her clavicular fracture with immobilization and safely ambulate. At this time she is also having recurrent falls despite the use of a walker and is unsafe to be discharged home.  Social Worker assessment / plan:  41 y.o. female with a history of psoriatic arthritis, left hip AVN, presenting for right upper extremity swelling and recurrent falls. CSW engaged with Patient at her bedside. CSW introduced self, role of CSW and discussed MD's concerns that the patient has a right clavicle fracture and left hip AVN, making it impossible for her to safely treat her clavicular fracture with immobilization and safely ambulate. At this time she is also having recurrent falls despite the use of a walker and is unsafe to be discharged home. Patient is agreeable to SNF placement and requests that she be placed in [place]. Patient with 328  YO son w/ autism. Patient's son is in the care of Patient's brother at this time. Patient also notes that her [place] assistance. CSW to initiate SNF referral process to be followed up by unit CSW as Patient is being admitted. MD to order PT evaluation.   Employment status:  Disabled (Comment on whether or not currently receiving Disability) Insurance information:  Managed Medicare PT Recommendations:  Not assessed at this time Information / Referral to community resources:  Skilled Nursing Facility  Patient/Family's Response to care:  Patient very appreciative of care received at this time. No concerns expressed.   Patient/Family's Understanding of and Emotional Response to Diagnosis, Current Treatment, and Prognosis:  Patient able to verbalize strong understanding of diagnosis, current treatment, and prognosis. Patient agreeable to SNF placement.   Emotional Assessment Appearance:  Appears stated age Attitude/Demeanor/Rapport:   (Cooperative; Pleasant; Engaging ) Affect (typically observed):  Accepting, Appropriate, Calm, Pleasant Orientation:  Oriented to Self, Oriented to Place, Oriented to  Time, Oriented to Situation Alcohol / Substance use:  Not Applicable Psych involvement (Current and /or in the community):  No (Comment)  Discharge Needs  Concerns to be addressed:  Discharge Planning Concerns, Care Coordination Readmission within the last 30 days:  No Current discharge risk:  Dependent with Mobility, Physical Impairment Barriers to Discharge:  Continued Medical Work up, Insurance Authorization   Lew Dawesshley N Tammey Deeg, LCSW 09/27/2016, 3:33 PM

## 2016-09-27 NOTE — ED Notes (Signed)
Gave patient a meal tray and water.

## 2016-09-27 NOTE — ED Notes (Signed)
Patient given dinner tray and drink. Talking on phone with brother at this time. NAD noted.

## 2016-09-27 NOTE — ED Notes (Signed)
Patient assisted to restroom with use of walker. Repositioned back in bed. Informed patient we are waiting on bed. No further needs expressed at this time.

## 2016-09-27 NOTE — ED Provider Notes (Signed)
[facility] Emergency Department Provider Note  ____________________________________________  Time seen: Approximately 12:49 PM  I have reviewed the triage vital signs and the nursing notes.   HISTORY  Chief Complaint Fall and Arm Swelling    HPI Dorothy Dyer is a 41 y.o. female with a history of severe psoriatic arthritis, left hip AVN, recurrent falls, presenting for right upper extremity swelling and fall. The patient reports that over the last 3 weeks she has had a progressively worsening right upper extremity swelling, which is worse at the end of the day than in the beginning of the day. She is not had any chest pain or shortness of breath. She has no history of blood clots. Several weeks ago, she fell resulting in a right clavicular fracture but has been unable to wear her immobilizer due to needing her right arm in order to use her walker which she requires because of the AVN in her left hip. She does have plans for left total hip arthroplasty in the future, but first test to have a significant amount of dental work for completely decayed teeth. She recently had a fall where she did strike her head and does have some acute on chronic neck pain. She denies any fever or chills, numbness tingling or weakness, visual changes, speech changes.   Past Medical History:  Diagnosis Date  . Abdominal pain, chronic, generalized   . Acid reflux   . Anemia   . Anxiety   . Arthritis   . AVN of femur (HCC)    left hip  . Clotting disorder (HCC)    undetermined  . Cystitis   . 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 Hypertension   . Neuromuscular disorder (HCC)   . Osteoporosis   . Psoriasis   . Thyroid disease     Patient Active Problem List   Diagnosis Date Noted  . Abdominal pain, chronic, generalized   . Protein-calorie malnutrition, severe 06/02/2016  . Acute encephalopathy 06/01/2016  . Chronic left hip pain 06/01/2016  . Overdose of  opiate or related narcotic, accidental or unintentional, initial encounter 06/01/2016  . Lower extremity cellulitis 06/01/2016  . 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 Left knee pain 06/01/2016  . Bilateral lower extremity edema 06/01/2016  . Acute hypokalemia 02/11/2016  . Hypoglycemia 02/11/2016  . Marijuana abuse 02/11/2016  . Chronic narcotic use 02/11/2016  . AKI (acute kidney injury) (HCC) 02/10/2016  . AVN of femur (HCC)   . 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 Hoarseness 06/17/2014  . 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 Fibromyalgia 06/17/2014  . Chronic abdominal pain 06/17/2014  . Knee pain, bilateral 06/17/2014  . Vitamin D deficiency 06/17/2014  . Vitamin B12 deficiency 06/17/2014  . Iron deficiency anemia 06/17/2014  . Thyroid activity decreased 06/17/2014  . Abnormal Pap smear of cervix 06/17/2014  . Anemia   . Anxiety   . Arthritis   . Acid reflux   . Neuromuscular disorder (HCC)   . Hypertension   . Osteoporosis   . HPV (human papilloma virus) infection     Past Surgical History:  Procedure Laterality Date  . CHOLECYSTECTOMY    . ESOPHAGOGASTRODUODENOSCOPY  10/2014   at [facility], was normal. Dr. Merri Brunette  . GASTRIC BYPASS  05/2001  . SMALL INTESTINE SURGERY  2004   exploratory    Current Outpatient Rx  . Order #: [id number] Class: Phone In  . Order #: [id number] Class: Normal  . Order #: [id number] Class: Normal  . Order #:  [id number] Class: Historical Med  . Order #: [id number] Class: OTC  . Order #: [id number] Class: Normal  . Order #: [id number] Class: Normal  . Order #: [id number] Class: Normal  . Order #: [id number] Class: Phone In  . Order #: [id number] Class: Normal  . Order #: [id number] Class: OTC  . Order #: [id number] Class: Normal  . Order #: [id number] Class: Normal  . Order #: [id number] Class: Normal  . Order #: [id number] Class: OTC  . Order #: [id number] Class: Normal  . Order #: [id number] Class: Normal  . Order #: [id number] Class: Normal  . Order #: [id number] Class:  Print    Allergies Ibuprofen; Toradol [ketorolac tromethamine]; Aspirin; and Cleocin [clindamycin hcl]  Family History  Problem Relation Age of Onset  . Depression Mother   . Diabetes Mother   . Hyperlipidemia Mother   . Depression Father   . Hearing loss Father   . Hyperlipidemia Father   . Hypertension Father   . Learning disabilities Father   . Mental illness Father   . Asthma Brother   . Alcohol abuse Maternal Grandmother   . Arthritis Maternal Grandmother   . Cancer Maternal Grandmother   . Cancer Maternal Grandfather   . Mental illness Paternal Grandmother   . Heart disease Paternal Grandfather   . Stroke Paternal Grandfather     Social History Social History  Substance Use Topics  . Smoking status: Current Every Day Smoker    Packs/day: 0.50    Types: Cigarettes  . Smokeless tobacco: Never Used  . Alcohol use 6.0 oz/week    10 Shots of liquor per week    Review of Systems Constitutional: No fever/chills. No lightheadedness or syncope. Eyes: No visual changes. No blurred or double vision. ENT: No sore throat. No congestion or rhinorrhea. Cardiovascular: Denies chest pain. Denies palpitations. Respiratory: Denies shortness of breath.  No cough. Gastrointestinal: No abdominal pain.  No nausea, no vomiting.  No diarrhea.  No constipation. Genitourinary: Negative for dysuria. Musculoskeletal: Negative for back pain. Positive neck pain. Positive pain over the right clavicle. The right upper extremity swelling. Pain in the left hip with associated difficulty walking. Skin: Positive for rash which is chronic. Neurological: Negative for headaches. No focal numbness, tingling or weakness.     ____________________________________________   PHYSICAL EXAM:  VITAL SIGNS: ED Triage Vitals  Enc Vitals Group     BP 09/27/16 0940 94/72     Pulse Rate 09/27/16 0940 95     Resp 09/27/16 0940 18     Temp 09/27/16 0940 (!) 97.5 F (36.4 C)     Temp Source 09/27/16  0940 Oral     SpO2 09/27/16 0940 99 %     Weight 09/27/16 0941 190 lb (86.2 kg)     Height 09/27/16 0941 5\' 1"  (1.549 m)     Head Circumference --      Peak Flow --      Pain Score 09/27/16 0947 6     Pain Loc --      Pain Edu? --      Excl. in GC? --     Constitutional: Alert and oriented. The patient is chronically ill-appearing and uncomfortable but nontoxic Answers questions appropriately. Eyes: Conjunctivae are normal.  EOMI. No scleral icterus. No eye discharge. No raccoon eyes. Head: Atraumatic. No Battle sign. Nose: No congestion/rhinnorhea. No swelling over the nose. No septal hematoma. Mouth/Throat: Mucous membranes are moist. Poor dentition diffusely. No evidence of acute abscess. No facial swelling. Neck: No stridor.  Supple.  No midline C-spine tenderness to palpation, step-offs or  deformities. Cardiovascular: Normal rate, regular rhythm. No murmurs, rubs or gallops. Mild hypotension with a blood pressure of 94/72. Respiratory: Normal respiratory effort.  No accessory muscle use or retractions. Lungs CTAB.  No wheezes, rales or ronchi. Gastrointestinal: Soft, nontender and nondistended.  No guarding or rebound.  No peritoneal signs. Musculoskeletal: Pain with range of motion of the left hip. Arthritic changes that are most evident in the finger joints. Tenderness to palpation over the mid clavicle on the right side. No palpable crepitus. The patient has diffuse edema in the right upper extremity throughout the entirety of the arm. Bilateral symmetric LE edema.  Neurologic:  A&Ox3.  Speech is clear.  Face and smile are symmetric.  EOMI.  Moves all extremities well. Skin:  Skin is warm, dry. Patient does have a scaly rash, most prominent between the webs of the fingers on the right hand, consistent with psoriasis. Psychiatric: Mood and affect are normal. Speech and behavior are normal.  Normal judgement.  ____________________________________________   LABS (all labs ordered are  listed, but only abnormal results are displayed)  Labs Reviewed  CBC WITH DIFFERENTIAL/PLATELET - Abnormal; Notable for the following:       Result Value   RBC 3.14 (*)    Hemoglobin 11.3 (*)    HCT 33.0 (*)    MCV 105.4 (*)    MCH 36.1 (*)    RDW 17.7 (*)    Platelets 135 (*)    All other components within normal limits  COMPREHENSIVE METABOLIC PANEL - Abnormal; Notable for the following:    Potassium 3.1 (*)    Calcium 7.8 (*)    Total Protein 4.8 (*)    Albumin 1.8 (*)    AST 72 (*)    Alkaline Phosphatase 189 (*)    Total Bilirubin 2.2 (*)    All other components within normal limits  APTT - Abnormal; Notable for the following:    aPTT <24 (*)    All other components within normal limits  PROTIME-INR   ____________________________________________  EKG  ED ECG REPORT Not indicated ____________________________________________  RADIOLOGY  Ct Head Wo Contrast  Result Date: 09/27/2016 CLINICAL DATA:  Fall today. Hit head. Chronic neck pain. Initial encounter. EXAM: CT HEAD WITHOUT CONTRAST CT CERVICAL SPINE WITHOUT CONTRAST TECHNIQUE: Multidetector CT imaging of the head and cervical spine was performed following the standard protocol without intravenous contrast. Multiplanar CT image reconstructions of the cervical spine were also generated. COMPARISON:  Head CT 06/01/2016. Right clavicle radiographs 07/06/2016. FINDINGS: CT HEAD FINDINGS Brain: There is no evidence of acute infarct, intracranial hemorrhage, mass, midline shift, or extra-axial fluid collection. The ventricles and sulci are normal. Vascular: No hyperdense vessel. Skull: No fracture or focal osseous lesion. Sinuses/Orbits: Mild left sphenoid sinus mucosal thickening. Clear mastoid air cells. Unremarkable orbits. Other: None. CT CERVICAL SPINE FINDINGS Alignment: Cervical spine straightening.  No listhesis. Skull base and vertebrae: No acute fracture or destructive osseous process. Soft tissues and spinal canal:  No prevertebral fluid or swelling. No visible canal hematoma. Disc levels: Minimal disc space narrowing and minimal right uncovertebral spurring at C5-6. Minimal left facet arthrosis at C3-4. No osseous spinal or neural foraminal stenosis. Upper chest: Clear lung apices. Other: The nondisplaced mid right clavicle fracture described on prior radiographs now demonstrates approximately 1.3 cm posterior displacement of the main lateral fragment as well as mild inferior displacement and overriding with surrounding callus formation and likely old hematoma. IMPRESSION: 1. No evidence of acute intracranial abnormality. 2. No cervical  spine fracture or subluxation. 3. Subacute right mid clavicle fracture with new displacement compared to 06/2016 radiographs. Electronically Signed   By: Sebastian Ache M.D.   On: 09/27/2016 11:12   Ct Cervical Spine Wo Contrast  Result Date: 09/27/2016 CLINICAL DATA:  Fall today. Hit head. Chronic neck pain. Initial encounter. EXAM: CT HEAD WITHOUT CONTRAST CT CERVICAL SPINE WITHOUT CONTRAST TECHNIQUE: Multidetector CT imaging of the head and cervical spine was performed following the standard protocol without intravenous contrast. Multiplanar CT image reconstructions of the cervical spine were also generated. COMPARISON:  Head CT 06/01/2016. Right clavicle radiographs 07/06/2016. FINDINGS: CT HEAD FINDINGS Brain: There is no evidence of acute infarct, intracranial hemorrhage, mass, midline shift, or extra-axial fluid collection. The ventricles and sulci are normal. Vascular: No hyperdense vessel. Skull: No fracture or focal osseous lesion. Sinuses/Orbits: Mild left sphenoid sinus mucosal thickening. Clear mastoid air cells. Unremarkable orbits. Other: None. CT CERVICAL SPINE FINDINGS Alignment: Cervical spine straightening.  No listhesis. Skull base and vertebrae: No acute fracture or destructive osseous process. Soft tissues and spinal canal: No prevertebral fluid or swelling. No visible  canal hematoma. Disc levels: Minimal disc space narrowing and minimal right uncovertebral spurring at C5-6. Minimal left facet arthrosis at C3-4. No osseous spinal or neural foraminal stenosis. Upper chest: Clear lung apices. Other: The nondisplaced mid right clavicle fracture described on prior radiographs now demonstrates approximately 1.3 cm posterior displacement of the main lateral fragment as well as mild inferior displacement and overriding with surrounding callus formation and likely old hematoma. IMPRESSION: 1. No evidence of acute intracranial abnormality. 2. No cervical spine fracture or subluxation. 3. Subacute right mid clavicle fracture with new displacement compared to 06/2016 radiographs. Electronically Signed   By: Sebastian Ache M.D.   On: 09/27/2016 11:12   US Venous Img Upper Uni Right  Result Date: 09/27/2016 CLINICAL DATA:  Right upper extremity swelling EXAM: RIGHT UPPER EXTREMITY VENOUS DOPPLER ULTRASOUND TECHNIQUE: Gray-scale sonography with graded compression, as well as color Doppler and duplex ultrasound were performed to evaluate the upper extremity deep venous system from the level of the subclavian vein and including the jugular, axillary, basilic, radial, ulnar and upper cephalic vein. Spectral Doppler was utilized to evaluate flow at rest and with distal augmentation maneuvers. COMPARISON:  None. FINDINGS: Contralateral Subclavian Vein: Respiratory phasicity is normal and symmetric with the symptomatic side. No evidence of thrombus. Normal compressibility. Internal Jugular Vein: No evidence of thrombus. Normal compressibility, respiratory phasicity and response to augmentation. Subclavian Vein: No evidence of thrombus. Normal compressibility, respiratory phasicity and response to augmentation. Axillary Vein: No evidence of thrombus. Normal compressibility, respiratory phasicity and response to augmentation. Cephalic Vein: No evidence of thrombus. Normal compressibility, respiratory  phasicity and response to augmentation. Basilic Vein: No evidence of thrombus. Normal compressibility, respiratory phasicity and response to augmentation. Brachial Veins: No evidence of thrombus. Normal compressibility, respiratory phasicity and response to augmentation. Radial Veins: No evidence of thrombus. Normal compressibility, respiratory phasicity and response to augmentation. Ulnar Veins: No evidence of thrombus. Normal compressibility, respiratory phasicity and response to augmentation. Venous Reflux:  None visualized. Other Findings:  None visualized. IMPRESSION: No evidence of DVT within the right upper extremity. Electronically Signed   By: Alcide Clever M.D.   On: 09/27/2016 12:05    ____________________________________________   PROCEDURES  Procedure(s) performed: None  Procedures  Critical Care performed: No ____________________________________________   INITIAL IMPRESSION / ASSESSMENT AND PLAN / ED COURSE  Pertinent labs & imaging results that were available during my  care of the patient were reviewed by me and considered in my medical decision making (see chart for details).  41 y.o. female with a history of psoriatic arthritis, left hip AVN, presenting for right upper extremity swelling and recurrent falls. Overall, the patient is mildly hypotensive and has dry mucous membranes, so she may have some dehydration. I will treat her with intravenous fluids. I am concerned that the patient has a right clavicle fracture and left hip AVN, making it impossible for her to safely treat her clavicular fracture with immobilization and safely ambulate. At this time she is also having recurrent falls despite the use of a walker and is unsafe to be discharged home. I'll plan to admit her to the hospital for further evaluation and treatment.  ____________________________________________  FINAL CLINICAL IMPRESSION(S) / ED DIAGNOSES  Final diagnoses:  Swelling  Recurrent falls   Pathologic clavicular fracture, right, initial encounter  Arm swelling  Hypokalemia  Left hip pain  Neck pain         NEW MEDICATIONS STARTED DURING THIS VISIT:  New Prescriptions   No medications on file      Rockne Menghini, MD 09/27/16 1302

## 2016-09-28 DIAGNOSIS — L4050 Arthropathic psoriasis, unspecified: Secondary | ICD-10-CM | POA: Diagnosis not present

## 2016-09-28 DIAGNOSIS — L409 Psoriasis, unspecified: Secondary | ICD-10-CM | POA: Diagnosis not present

## 2016-09-28 DIAGNOSIS — E039 Hypothyroidism, unspecified: Secondary | ICD-10-CM | POA: Diagnosis not present

## 2016-09-28 DIAGNOSIS — S42009A Fracture of unspecified part of unspecified clavicle, initial encounter for closed fracture: Secondary | ICD-10-CM | POA: Diagnosis not present

## 2016-09-28 LAB — BASIC METABOLIC PANEL
Anion gap: 5 (ref 5–15)
BUN: 9 mg/dL (ref 6–20)
CHLORIDE: 108 mmol/L (ref 101–111)
CO2: 27 mmol/L (ref 22–32)
CREATININE: 0.62 mg/dL (ref 0.44–1.00)
Calcium: 7.7 mg/dL — ABNORMAL LOW (ref 8.9–10.3)
GFR calc Af Amer: >60 mL/min (ref >60)
GFR calc non Af Amer: >60 mL/min (ref >60)
Glucose, Bld: 99 mg/dL (ref 65–99)
Potassium: 3.3 mmol/L — ABNORMAL LOW (ref 3.5–5.1)
SODIUM: 140 mmol/L (ref 135–145)

## 2016-09-28 LAB — CBC
HCT: 28.8 % — ABNORMAL LOW (ref 35.0–47.0)
Hemoglobin: 9.9 g/dL — ABNORMAL LOW (ref 12.0–16.0)
MCH: 36.7 pg — AB (ref 26.0–34.0)
MCHC: 34.3 g/dL (ref 32.0–36.0)
MCV: 107.1 fL — AB (ref 80.0–100.0)
PLATELETS: 99 10*3/uL — AB (ref 150–440)
RBC: 2.69 MIL/uL — ABNORMAL LOW (ref 3.80–5.20)
RDW: 17.5 % — AB (ref 11.5–14.5)
WBC: 4.1 10*3/uL (ref 3.6–11.0)

## 2016-09-28 LAB — MAGNESIUM: Magnesium: 1.6 mg/dL — ABNORMAL LOW (ref 1.7–2.4)

## 2016-09-28 MED ORDER — NORTRIPTYLINE HCL 25 MG PO CAPS
75.0000 mg | ORAL_CAPSULE | Freq: Every day | ORAL | Status: DC
  Administered 2016-09-28: 75 mg via ORAL
  Filled 2016-09-28 (×2): qty 3

## 2016-09-28 MED ORDER — POTASSIUM CHLORIDE CRYS ER 20 MEQ PO TBCR
40.0000 meq | EXTENDED_RELEASE_TABLET | Freq: Two times a day (BID) | ORAL | Status: DC
  Administered 2016-09-28 – 2016-09-29 (×3): 40 meq via ORAL
  Filled 2016-09-28 (×3): qty 2

## 2016-09-28 MED ORDER — SENNOSIDES-DOCUSATE SODIUM 8.6-50 MG PO TABS
1.0000 | ORAL_TABLET | Freq: Two times a day (BID) | ORAL | Status: DC
  Administered 2016-09-29: 1 via ORAL
  Filled 2016-09-28 (×2): qty 1

## 2016-09-28 MED ORDER — MAGNESIUM SULFATE 2 GM/50ML IV SOLN
2.0000 g | Freq: Once | INTRAVENOUS | Status: AC
  Administered 2016-09-28: 2 g via INTRAVENOUS
  Filled 2016-09-28: qty 50

## 2016-09-28 MED ORDER — SIMETHICONE 80 MG PO CHEW
80.0000 mg | CHEWABLE_TABLET | Freq: Four times a day (QID) | ORAL | Status: DC | PRN
  Administered 2016-09-28: 80 mg via ORAL
  Filled 2016-09-28 (×2): qty 1

## 2016-09-28 NOTE — Progress Notes (Signed)
[employer] - [place] at [facility]                                                                                                                                                                                  Patient Demographics   Dorothy Dyer, is a 41 y.o. 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 No fatigue, weakness. No weight gain, no weight loss.  EYES: No blurry or double vision.  ENT: No tinnitus. No postnasal drip. No redness of the oropharynx.  RESPIRATORY: No cough, no wheeze, no hemoptysis. No dyspnea.  CARDIOVASCULAR: No chest pain. No orthopnea. No palpitations. No syncope.  GASTROINTESTINAL: No nausea, no vomiting or diarrhea. No abdominal pain. No melena or hematochezia.  GENITOURINARY: No dysuria or hematuria.  ENDOCRINE: No polyuria or nocturia. No heat or cold intolerance.  HEMATOLOGY: No anemia. No bruising. No bleeding.  INTEGUMENTARY: No rashes. No lesions.  MUSCULOSKELETAL: Positive clavicular pain. Hip pain NEUROLOGIC: No numbness, tingling, or ataxia. No seizure-type activity.  PSYCHIATRIC: No anxiety. No insomnia. No ADD.    Vitals:   Vitals:   09/27/16 1730 09/27/16 1816 09/27/16 2219 09/28/16 0845  BP: (!) 113/97 130/86 104/70 110/70  Pulse: 89 70 86 88  Resp:  18 16 18   Temp:  97.8 F (36.6 C) 97.7 F (36.5 C) 97.8 F (36.6 C)  TempSrc:  Oral Oral Oral  SpO2: 97% 98% 100% 100%  Weight:      Height:        Wt Readings from Last 3 Encounters:  09/27/16 190 lb (86.2 kg)  07/31/16 174 lb (78.9 kg)  06/08/16 182 lb (82.6 kg)     Intake/Output Summary (Last 24 hours) at 09/28/16 1428 Last data filed at 09/28/16 0800  Gross per 24 hour   Intake              240 ml  Output                0 ml  Net              240 ml    Physical Exam:   GENERAL: Pleasant-appearing in no apparent distress.  HEAD, EYES, EARS, NOSE AND THROAT: Atraumatic, normocephalic. Extraocular muscles are intact. Pupils equal and reactive to light. Sclerae anicteric. No conjunctival injection. No oro-pharyngeal erythema.  NECK: Supple. There is no jugular venous distention. No bruits, no  lymphadenopathy, no thyromegaly.  HEART: Regular rate and rhythm,. No murmurs, no rubs, no clicks.  LUNGS: Clear to auscultation bilaterally. No rales or rhonchi. No wheezes.  ABDOMEN: Soft, flat, nontender, nondistended. Has good bowel sounds. No hepatosplenomegaly appreciated.  EXTREMITIES: No evidence of any cyanosis, clubbing, or peripheral edema.  +2 pedal and radial pulses bilaterally. Kling in place NEUROLOGIC: The patient is alert, awake, and oriented x3 with no focal motor or sensory deficits appreciated bilaterally.  SKIN: Psoriatic changes Psych: Not anxious, depressed LN: No inguinal LN enlargement    Antibiotics   Anti-infectives    None      Medications   Scheduled Meds: . heparin  5,000 Units Subcutaneous Q8H  . levothyroxine  125 mcg Oral Once every other day  . levothyroxine  137 mcg Oral Once every other day  . metolazone  2.5 mg Oral Daily  . nortriptyline  75 mg Oral QHS  . pantoprazole  40 mg Oral Daily  . potassium chloride  40 mEq Oral BID  . pregabalin  225 mg Oral BID  . senna-docusate  1 tablet Oral BID  . topiramate  50 mg Oral QHS   Continuous Infusions: . magnesium sulfate 1 - 4 g bolus IVPB     PRN Meds:.ALPRAZolam, docusate sodium, morphine injection, naproxen, oxyCODONE-acetaminophen, simethicone   Data Review:   Micro Results No results found for this or any previous visit (from the past 240 hour(s)).  Radiology Reports Dg Chest 2 View  Result Date: 09/27/2016 CLINICAL DATA:  41 year old female with shortness  of breath. Clavicle fracture a weeks ago. EXAM: CHEST  2 VIEW COMPARISON:  06/01/2016 chest radiographs. FINDINGS: Seated AP and lateral views of the chest. Midshaft right clavicle fracture with periosteal new bone and callus formation. Other visible osseous structures appear intact. Mildly larger lung volumes. The lungs appear clear. No pneumothorax or pleural effusion. Normal cardiac size and mediastinal contours. Visualized tracheal air column is within normal limits. Negative visible bowel gas pattern. IMPRESSION: 1. Midshaft right clavicle fracture with periosteal new bone formation. 2. Otherwise negative chest. Electronically Signed   By: Odessa FlemingH  Hall M.D.   On: 09/27/2016 14:03   Ct Head Wo Contrast  Result Date: 09/27/2016 CLINICAL DATA:  Fall today. Hit head. Chronic neck pain. Initial encounter. EXAM: CT HEAD WITHOUT CONTRAST CT CERVICAL SPINE WITHOUT CONTRAST TECHNIQUE: Multidetector CT imaging of the head and cervical spine was performed following the standard protocol without intravenous contrast. Multiplanar CT image reconstructions of the cervical spine were also generated. COMPARISON:  Head CT 06/01/2016. Right clavicle radiographs 07/06/2016. FINDINGS: CT HEAD FINDINGS Brain: There is no evidence of acute infarct, intracranial hemorrhage, mass, midline shift, or extra-axial fluid collection. The ventricles and sulci are normal. Vascular: No hyperdense vessel. Skull: No fracture or focal osseous lesion. Sinuses/Orbits: Mild left sphenoid sinus mucosal thickening. Clear mastoid air cells. Unremarkable orbits. Other: None. CT CERVICAL SPINE FINDINGS Alignment: Cervical spine straightening.  No listhesis. Skull base and vertebrae: No acute fracture or destructive osseous process. Soft tissues and spinal canal: No prevertebral fluid or swelling. No visible canal hematoma. Disc levels: Minimal disc space narrowing and minimal right uncovertebral spurring at C5-6. Minimal left facet arthrosis at C3-4. No  osseous spinal or neural foraminal stenosis. Upper chest: Clear lung apices. Other: The nondisplaced mid right clavicle fracture described on prior radiographs now demonstrates approximately 1.3 cm posterior displacement of the main lateral fragment as well as mild inferior displacement and overriding with surrounding callus formation and likely old  hematoma. IMPRESSION: 1. No evidence of acute intracranial abnormality. 2. No cervical spine fracture or subluxation. 3. Subacute right mid clavicle fracture with new displacement compared to 06/2016 radiographs. Electronically Signed   By: Sebastian Ache M.D.   On: 09/27/2016 11:12   Ct Cervical Spine Wo Contrast  Result Date: 09/27/2016 CLINICAL DATA:  Fall today. Hit head. Chronic neck pain. Initial encounter. EXAM: CT HEAD WITHOUT CONTRAST CT CERVICAL SPINE WITHOUT CONTRAST TECHNIQUE: Multidetector CT imaging of the head and cervical spine was performed following the standard protocol without intravenous contrast. Multiplanar CT image reconstructions of the cervical spine were also generated. COMPARISON:  Head CT 06/01/2016. Right clavicle radiographs 07/06/2016. FINDINGS: CT HEAD FINDINGS Brain: There is no evidence of acute infarct, intracranial hemorrhage, mass, midline shift, or extra-axial fluid collection. The ventricles and sulci are normal. Vascular: No hyperdense vessel. Skull: No fracture or focal osseous lesion. Sinuses/Orbits: Mild left sphenoid sinus mucosal thickening. Clear mastoid air cells. Unremarkable orbits. Other: None. CT CERVICAL SPINE FINDINGS Alignment: Cervical spine straightening.  No listhesis. Skull base and vertebrae: No acute fracture or destructive osseous process. Soft tissues and spinal canal: No prevertebral fluid or swelling. No visible canal hematoma. Disc levels: Minimal disc space narrowing and minimal right uncovertebral spurring at C5-6. Minimal left facet arthrosis at C3-4. No osseous spinal or neural foraminal stenosis.  Upper chest: Clear lung apices. Other: The nondisplaced mid right clavicle fracture described on prior radiographs now demonstrates approximately 1.3 cm posterior displacement of the main lateral fragment as well as mild inferior displacement and overriding with surrounding callus formation and likely old hematoma. IMPRESSION: 1. No evidence of acute intracranial abnormality. 2. No cervical spine fracture or subluxation. 3. Subacute right mid clavicle fracture with new displacement compared to 06/2016 radiographs. Electronically Signed   By: Sebastian Ache M.D.   On: 09/27/2016 11:12   US Venous Img Upper Uni Right  Result Date: 09/27/2016 CLINICAL DATA:  Right upper extremity swelling EXAM: RIGHT UPPER EXTREMITY VENOUS DOPPLER ULTRASOUND TECHNIQUE: Gray-scale sonography with graded compression, as well as color Doppler and duplex ultrasound were performed to evaluate the upper extremity deep venous system from the level of the subclavian vein and including the jugular, axillary, basilic, radial, ulnar and upper cephalic vein. Spectral Doppler was utilized to evaluate flow at rest and with distal augmentation maneuvers. COMPARISON:  None. FINDINGS: Contralateral Subclavian Vein: Respiratory phasicity is normal and symmetric with the symptomatic side. No evidence of thrombus. Normal compressibility. Internal Jugular Vein: No evidence of thrombus. Normal compressibility, respiratory phasicity and response to augmentation. Subclavian Vein: No evidence of thrombus. Normal compressibility, respiratory phasicity and response to augmentation. Axillary Vein: No evidence of thrombus. Normal compressibility, respiratory phasicity and response to augmentation. Cephalic Vein: No evidence of thrombus. Normal compressibility, respiratory phasicity and response to augmentation. Basilic Vein: No evidence of thrombus. Normal compressibility, respiratory phasicity and response to augmentation. Brachial Veins: No evidence of  thrombus. Normal compressibility, respiratory phasicity and response to augmentation. Radial Veins: No evidence of thrombus. Normal compressibility, respiratory phasicity and response to augmentation. Ulnar Veins: No evidence of thrombus. Normal compressibility, respiratory phasicity and response to augmentation. Venous Reflux:  None visualized. Other Findings:  None visualized. IMPRESSION: No evidence of DVT within the right upper extremity. Electronically Signed   By: Alcide Clever M.D.   On: 09/27/2016 12:05     CBC  Recent Labs Lab 09/27/16 0953 09/28/16 0441  WBC 6.2 4.1  HGB 11.3* 9.9*  HCT 33.0* 28.8*  PLT 135* 99*  MCV 105.4* 107.1*  MCH 36.1* 36.7*  MCHC 34.2 34.3  RDW 17.7* 17.5*  LYMPHSABS 1.8  --   MONOABS 0.7  --   EOSABS 0.2  --   BASOSABS 0.1  --     Chemistries   Recent Labs Lab 09/27/16 0953 09/28/16 0441  NA 143 140  K 3.1* 3.3*  CL 107 108  CO2 27 27  GLUCOSE 81 99  BUN 9 9  CREATININE 0.67 0.62  CALCIUM 7.8* 7.7*  MG  --  1.6*  AST 72*  --   ALT 39  --   ALKPHOS 189*  --   BILITOT 2.2*  --    ------------------------------------------------------------------------------------------------------------------ estimated creatinine clearance is 91.4 mL/min (by C-G formula based on SCr of 0.62 mg/dL). ------------------------------------------------------------------------------------------------------------------ No results for input(s): HGBA1C in the last 72 hours. ------------------------------------------------------------------------------------------------------------------ No results for input(s): CHOL, HDL, LDLCALC, TRIG, CHOLHDL, LDLDIRECT in the last 72 hours. ------------------------------------------------------------------------------------------------------------------ No results for input(s): TSH, T4TOTAL, T3FREE, THYROIDAB in the last 72 hours.  Invalid input(s):  FREET3 ------------------------------------------------------------------------------------------------------------------ No results for input(s): VITAMINB12, FOLATE, FERRITIN, TIBC, IRON, RETICCTPCT in the last 72 hours.  Coagulation profile  Recent Labs Lab 09/27/16 0953  INR 1.01    No results for input(s): DDIMER in the last 72 hours.  Cardiac Enzymes No results for input(s): CKMB, TROPONINI, MYOGLOBIN in the last 168 hours.  Invalid input(s): CK ------------------------------------------------------------------------------------------------------------------ Invalid input(s): POCBNP    Assessment & Plan   IMPRESSION AND PLAN:  * Clavicular fracture   Right upper extremity swelling   Pain managementSling     *Electrolyte imbalances with hypokalemia we'll replace potassium   * Psoriatic arthritis   Psoriatic skin changes.    Rheumatology consult.  * Hypothyroidism   Continue levothyroxine.  * Active smoking    Counseled to quit smoking by the admitting physician     Code Status Orders        Start     Ordered   09/27/16 1812  Full code  Continuous     09/27/16 1811    Code Status History    Date Active Date Inactive Code Status Order ID Comments User Context   06/01/2016  6:34 PM 06/03/2016 10:42 PM Full Code [id number]  Russella Dar, NP Inpatient   02/11/2016  2:22 AM 02/11/2016 10:15 PM Full Code [id number]  Eduard Clos, MD Inpatient           Consults None DVT Prophylaxis  heparin  Lab Results  Component Value Date   PLT 99 (L) 09/28/2016     Time Spent in minutes   35 minutes  Greater than 50% of time spent in care coordination and counseling patient regarding the condition and plan of care.   Auburn Bilberry M.D on 09/28/2016 at 2:28 PM  Between 7am to 6pm - Pager - [phone]  After 6pm go to [email or web address] - password [email or web address] [facility]  [facility]   Office  [phone]

## 2016-09-28 NOTE — Progress Notes (Signed)
PASARR has been received, [id number] A. PT note was sent to [facility]. Per Tammy admissions coordinator at [facility] she [place]. CSW will continue to follow and assist as needed.   Baker Hughes IncorporatedBailey Miyanna Wiersma, LCSW [phone]

## 2016-09-28 NOTE — Progress Notes (Signed)
PT is recommending SNF. PASARR is pending. Clinical Child psychotherapistocial Worker (CSW) presented bed offers to patient and she chose [facility] [place]. Per Tammy admissions coordinator at [facility] she will start [place] as soon as PT note is available. CSW will continue to follow and assist as needed.   [employer] Hughes [employer]orporatedBailey Yan Okray, LCSW [phone]

## 2016-09-28 NOTE — Care Management Obs Status (Signed)
MEDICARE OBSERVATION STATUS NOTIFICATION   Patient Details  Name: Dorothy Dyer MRN: [id number] Date of Birth: 09/28/1975   Medicare Observation Status Notification Given:  Yes    Marily MemosLisa M Jalaysha Skilton, RN 09/28/2016, 9:09 AM

## 2016-09-28 NOTE — Progress Notes (Addendum)
RUE immobilizer provided to PT for patient.

## 2016-09-28 NOTE — Progress Notes (Signed)
PT Attempt Note  Patient Details Name: Dorothy PeersCrystal Dyer MRN: [id number] DOB: 01/18/1976   Evaluation Attempt:    Reason Eval/Treat Not Completed: Medical issues which prohibited therapy. Spoke with patient in room. She has a R clavicle fracture and per CT 09/27/16 from  new displacement compared to 06/2016 radiographs. She was advised after initial fracture to remain in immobilizer but ignored recommendations due to inability to ambulate secondary to LE weakness without bilateral UE support on rolling walker. MD order for shoulder immobilizer in Epic however pt still not in immobilizer and no immobilizer in room. RN notified. She will arrange getting an immobilizer. PT evaluation will be performed once RUE is adequately supported in an immobilizer.   Sharalyn InkJason D Huprich PT, DPT   Huprich,Jason 09/28/2016, 10:13 AM

## 2016-09-28 NOTE — Evaluation (Signed)
Physical Therapy Evaluation Patient Details Name: Dorothy Dyer MRN: [id number] DOB: 03-20-1975 Today's Date: 09/28/2016   History of Present Illness  Leela Saxe is a 41 y.o. 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 She does not have dental insurance and is working with [employer]. They told she has total 11 teeth which need some workup and some of them need to be pulled out she is working with them by multiple appointments to take care of that.  Clinical Impression  Pt admitted with above diagnosis. Pt currently with functional limitations due to the deficits listed below (see PT Problem List). Pt is very weak and has not been following her RUE restrictions. She is so weak in her bilateral LEs that she is unable to ambulate without considerable assistance when only using LUE for support. She requires minA+1 for bed mobility and heavy use of bed rail. She requires modA+1 for transfers and very limited ambulation from bed to recliner. She is only able to take very short, shuffling steps to transfer into recliner. She is very unsteady and weak in standing. Pt has very limited support from family and is very unsafe to return home at this time. She has suffered 7 falls in the last 6 months and her clavicular fracture has worsened since onset due to inability to following RUE restrictions and repeated falls. She will need SNF placement at discharge in order to facilitate safe return to prior level of function at home. Pt will benefit from PT services to address deficits in strength, balance, and mobility in order to return to full function at home after SNF placement.     Follow Up Recommendations SNF    Equipment  Recommendations  None recommended by PT;Other (comment) (TBD at next facility)    Recommendations for Other Services       Precautions / Restrictions Precautions Precautions: Fall;Shoulder Type of Shoulder Precautions: Must stay in R shoulder sling at all times Shoulder Interventions: Shoulder sling/immobilizer Precaution Comments: Extensive education regarding importance of allowing her clavicular fracture to heal Required Braces or Orthoses: Sling Restrictions Weight Bearing Restrictions: Yes Other Position/Activity Restrictions: NWB RUE, remain in sling      Mobility  Bed Mobility Overal bed mobility: Needs Assistance Bed Mobility: Supine to Sit     Supine to sit: Min assist     General bed mobility comments: Pt requires minA+1 for bilateral LEs as well as increased time to come upright to sitting at EOB. Heavy LUE reliance on bed rail. Fair static sitting balance  Transfers Overall transfer level: Needs assistance Equipment used: Straight cane Transfers: Sit to/from Stand Sit to Stand: Mod assist         General transfer comment: Pt requires modA+1 for sit to stand transfers. Cues for safe hand placement. Single point cane in LUE to stabilize once upright in standing  Ambulation/Gait Ambulation/Gait assistance: Mod assist Ambulation Distance (Feet): 3 Feet Assistive device: Straight cane Gait Pattern/deviations: Decreased step length - right;Decreased step length - left Gait velocity: Decreased Gait velocity interpretation: <1.8 ft/sec, indicative of risk for recurrent falls General Gait Details: Pt only able to take very short, shuffling steps to transfer into recliner. She is very unsteady and weak in standing. Considerable effort required and unsteady  with respect to balance  Stairs            Wheelchair Mobility    Modified Rankin (Stroke Patients Only)       Balance Overall balance assessment: Needs assistance Sitting-balance support: No upper  extremity supported Sitting balance-Leahy Scale: Fair     Standing balance support: Single extremity supported Standing balance-Leahy Scale: Poor                               Pertinent Vitals/Pain Pain Assessment: 0-10 Pain Score: 9  Pain Location: L hip pain Pain Descriptors / Indicators: Aching Pain Intervention(s): Monitored during session;Limited activity within patient's tolerance    Home Living Family/patient expects to be discharged to:: Private residence Living Arrangements: Children Available Help at Discharge: Other (Comment) (Parents are traveling from [place] to help as able) Type of Home: Mobile home Home Access: Stairs to enter Entrance Stairs-Rails: Right Entrance Stairs-Number of Steps: 4 Home Layout: One level Home Equipment: Walker - 2 wheels;Bedside commode (sock puller)      Prior Function Level of Independence: Independent with assistive device(s)         Comments: lives with autistic son. Brother and sister in law provide some assist. Father and mother coming from [place]. States that previoulsy she was independent with ADLs/IADLs. Ambulates with rolling walker. Reports approximately 7 falls in the last 6 months     Hand Dominance        Extremity/Trunk Assessment   Upper Extremity Assessment Upper Extremity Assessment: RUE deficits/detail RUE Deficits / Details: RUE in shoulder sling. Pt is functionally weak with LUE    Lower Extremity Assessment Lower Extremity Assessment: LLE deficits/detail LLE Deficits / Details: Generally weak throughout bilateral LEs. No formal MMT performed secondary to LLE pain. Pt with significant functional weakness with sit to stand transfers.       Communication   Communication: No difficulties  Cognition Arousal/Alertness: Awake/alert Behavior During Therapy: WFL for tasks assessed/performed Overall Cognitive Status: Within Functional Limits for tasks assessed                                         General Comments      Exercises     Assessment/Plan    PT Assessment Patient needs continued PT services  PT Problem List Decreased strength;Decreased activity tolerance;Decreased balance;Decreased mobility;Decreased knowledge of use of DME;Decreased safety awareness;Decreased knowledge of precautions;Pain;Obesity       PT Treatment Interventions DME instruction;Gait training;Stair training;Functional mobility training;Therapeutic exercise;Therapeutic activities;Balance training;Neuromuscular re-education;Patient/family education    PT Goals (Current goals can be found in the Care Plan section)  Acute Rehab PT Goals Patient Stated Goal: Go to SNF in order to get strong enough to return home PT Goal Formulation: With patient Time For Goal Achievement: 10/12/16 Potential to Achieve Goals: Fair    Frequency Min 2X/week   Barriers to discharge Decreased caregiver support Pt lives with son. Has only minimal support from family. She is significantly impaired in her mobility. She has sufferred from repeated falls and is still a very high fall risk    Co-evaluation               AM-PAC PT "6 Clicks" Daily Activity  Outcome Measure Difficulty turning over in bed (including adjusting bedclothes, sheets and blankets)?: Total Difficulty moving from lying on back to sitting  on the side of the bed? : Total Difficulty sitting down on and standing up from a chair with arms (e.g., wheelchair, bedside commode, etc,.)?: Total Help needed moving to and from a bed to chair (including a wheelchair)?: A Lot Help needed walking in hospital room?: Total Help needed climbing 3-5 steps with a railing? : Total 6 Click Score: 7    End of Session Equipment Utilized During Treatment: Gait belt Activity Tolerance: Patient tolerated treatment well Patient left: in chair;with call bell/phone within reach;with chair alarm set Nurse Communication: Mobility status PT  Visit Diagnosis: Unsteadiness on feet (R26.81);Repeated falls (R29.6);Muscle weakness (generalized) (M62.81);Pain Pain - Right/Left: Left Pain - part of body: Hip    Time: 1610-9604 PT Time Calculation (min) (ACUTE ONLY): 19 min   Charges:   PT Evaluation $PT Eval Moderate Complexity: 1 Procedure     PT G Codes:   PT G-Codes **NOT FOR INPATIENT CLASS** Functional Assessment Tool Used: AM-PAC 6 Clicks Basic Mobility Functional Limitation: Mobility: Walking and moving around Mobility: Walking and Moving Around Current Status (V4098): At least 80 percent but less than 100 percent impaired, limited or restricted Mobility: Walking and Moving Around Goal Status [id number]): At least 40 percent but less than 60 percent impaired, limited or restricted    Lynnea Maizes PT, DPT    Dencil Cayson 09/28/2016, 2:43 PM

## 2016-09-29 DIAGNOSIS — L4050 Arthropathic psoriasis, unspecified: Secondary | ICD-10-CM | POA: Diagnosis not present

## 2016-09-29 DIAGNOSIS — E039 Hypothyroidism, unspecified: Secondary | ICD-10-CM | POA: Diagnosis not present

## 2016-09-29 DIAGNOSIS — L409 Psoriasis, unspecified: Secondary | ICD-10-CM | POA: Diagnosis not present

## 2016-09-29 DIAGNOSIS — S42009A Fracture of unspecified part of unspecified clavicle, initial encounter for closed fracture: Secondary | ICD-10-CM | POA: Diagnosis not present

## 2016-09-29 LAB — BASIC METABOLIC PANEL
ANION GAP: 7 (ref 5–15)
BUN: 12 mg/dL (ref 6–20)
CALCIUM: 8.2 mg/dL — AB (ref 8.9–10.3)
CO2: 26 mmol/L (ref 22–32)
CREATININE: 0.79 mg/dL (ref 0.44–1.00)
Chloride: 107 mmol/L (ref 101–111)
GFR calc Af Amer: >60 mL/min (ref >60)
GFR calc non Af Amer: >60 mL/min (ref >60)
GLUCOSE: 59 mg/dL — AB (ref 65–99)
Potassium: 4.1 mmol/L (ref 3.5–5.1)
Sodium: 140 mmol/L (ref 135–145)

## 2016-09-29 LAB — MAGNESIUM: Magnesium: 2 mg/dL (ref 1.7–2.4)

## 2016-09-29 LAB — GLUCOSE, CAPILLARY: Glucose-Capillary: 78 mg/dL (ref 65–99)

## 2016-09-29 MED ORDER — MAGNESIUM OXIDE 400 MG PO CAPS: 1.0000 | ORAL_CAPSULE | Freq: Two times a day (BID) | ORAL | 0 refills | Status: AC

## 2016-09-29 MED ORDER — DOCUSATE SODIUM 100 MG PO CAPS: 100.0000 mg | ORAL_CAPSULE | Freq: Two times a day (BID) | ORAL | 0 refills | Status: DC | PRN

## 2016-09-29 MED ORDER — ALPRAZOLAM 0.5 MG PO TABS: 0.5000 mg | ORAL_TABLET | Freq: Three times a day (TID) | ORAL | 0 refills | Status: DC | PRN

## 2016-09-29 MED ORDER — OXYCODONE-ACETAMINOPHEN 5-325 MG PO TABS: 1.0000 | ORAL_TABLET | Freq: Four times a day (QID) | ORAL | 0 refills | Status: DC | PRN

## 2016-09-29 MED ORDER — SIMETHICONE 80 MG PO CHEW: 80.0000 mg | CHEWABLE_TABLET | Freq: Four times a day (QID) | ORAL | 0 refills | Status: AC | PRN

## 2016-09-29 NOTE — NC FL2 (Signed)
[place] MEDICAID FL2 LEVEL OF CARE SCREENING TOOL     IDENTIFICATION  Patient Name: Dorothy Dyer Birthdate: [birth year] Sex: female Admission Date (Current Location): 09/27/2016  [place] and [place] Number:  Chiropodist and Address:  [facility], [address] [place], [place] [address]      Provider Number: [id number]  Attending Physician Name and Address:  Auburn Bilberry, MD  Relative Name and Phone Number:  Dorothy Dyer [facility]) [phone]    Current Level of Care: Hospital Recommended Level of Care: Skilled Nursing Facility Prior Approval Number:    Date Approved/Denied:   PASRR Number:  [id number]  Discharge Plan: SNF    Current Diagnoses: Patient Active Problem List   Diagnosis Date Noted  . Pain 09/27/2016  . Clavicular fracture 09/27/2016  . Abdominal pain, chronic, generalized   . Protein-calorie malnutrition, severe 06/02/2016  . Acute encephalopathy 06/01/2016  . Chronic left hip pain 06/01/2016  . Overdose of opiate or related narcotic, accidental or unintentional, initial encounter 06/01/2016  . Lower extremity cellulitis 06/01/2016  . 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 Left knee pain 06/01/2016  . Bilateral lower extremity edema 06/01/2016  . Acute hypokalemia 02/11/2016  . Hypoglycemia 02/11/2016  . Marijuana abuse 02/11/2016  . Chronic narcotic use 02/11/2016  . AKI (acute kidney injury) (HCC) 02/10/2016  . AVN of femur (HCC)   . 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 Hoarseness 06/17/2014  . 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 Fibromyalgia 06/17/2014  . Chronic abdominal pain 06/17/2014  . Knee pain, bilateral 06/17/2014  . Vitamin D deficiency 06/17/2014  . Vitamin B12 deficiency 06/17/2014  . Iron deficiency anemia 06/17/2014  . Thyroid activity decreased 06/17/2014  . Abnormal Pap smear of cervix 06/17/2014  . Anemia   . Anxiety   . Arthritis   . Acid reflux   . Neuromuscular disorder (HCC)    . Hypertension   . Osteoporosis   . 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)                       Personal Care Assistance Level of Assistance  Bathing, Feeding, Dressing Bathing Assistance: Limited assistance Feeding assistance: Independent Dressing Assistance: Limited assistance     Functional Limitations Info  Sight, Speech, Hearing Sight Info: Adequate Hearing Info: Adequate Speech Info: Adequate    SPECIAL CARE FACTORS FREQUENCY  PT (By licensed PT)  5 days per week                   Contractures Contractures Info: Not present    Additional Factors Info  Code Status, Allergies, Psychotropic Code Status Info: FULL Allergies Info:  Ibuprofen, Toradol Ketorolac Tromethamine, Aspirin, Cleocin Clindamycin Hcl Psychotropic Info: Xanax         Current Medications (09/29/2016):  This is the current hospital active medication list Current Facility-Administered Medications  Medication Dose Route Frequency Provider Last Rate Last Dose  . ALPRAZolam Prudy Feeler) tablet 0.5 mg  0.5 mg Oral TID PRN Altamese Dilling, MD      . docusate sodium (COLACE)  capsule 100 mg  100 mg Oral BID PRN Altamese DillingVachhani, Vaibhavkumar, MD      . heparin injection 5,000 Units  5,000 Units Subcutaneous Q8H Altamese DillingVachhani, Vaibhavkumar, MD   5,000 Units at 09/29/16 0617  . levothyroxine (SYNTHROID, LEVOTHROID) tablet 125 mcg  125 mcg Oral Once every other day Altamese DillingVachhani, Vaibhavkumar, MD   125 mcg at 09/29/16 0931  . levothyroxine (SYNTHROID, LEVOTHROID) tablet 137 mcg  137 mcg Oral Once every other day  Altamese DillingVachhani, Vaibhavkumar, MD   137 mcg at 09/28/16 0843  . metolazone (ZAROXOLYN) tablet 2.5 mg  2.5 mg Oral Daily Altamese DillingVachhani, Vaibhavkumar, MD   2.5 mg at 09/29/16 0934  . naproxen (NAPROSYN) tablet 250 mg  250 mg Oral BID PRN Altamese DillingVachhani, Vaibhavkumar, MD      . nortriptyline (PAMELOR) capsule 75 mg  75 mg Oral QHS Auburn BilberryPatel, Shreyang, MD   75 mg at 09/28/16 2013  . oxyCODONE-acetaminophen (PERCOCET/ROXICET) 5-325 MG per tablet 1 tablet  1 tablet Oral Q6H PRN Altamese DillingVachhani, Vaibhavkumar, MD   1 tablet at 09/29/16 0618  . pantoprazole (PROTONIX) EC tablet 40 mg  40 mg Oral Daily Altamese DillingVachhani, Vaibhavkumar, MD   40 mg at 09/29/16 0932  . potassium chloride SA (K-DUR,KLOR-CON) CR tablet 40 mEq  40 mEq Oral BID Auburn BilberryPatel, Shreyang, MD   40 mEq at 09/29/16 0933  . pregabalin (LYRICA) capsule 225 mg  225 mg Oral BID Altamese DillingVachhani, Vaibhavkumar, MD   225 mg at 09/29/16 0932  . senna-docusate (Senokot-S) tablet 1 tablet  1 tablet Oral BID Auburn BilberryPatel, Shreyang, MD   1 tablet at 09/29/16 0933  . simethicone (MYLICON) chewable tablet 80 mg  80 mg Oral QID PRN Auburn BilberryPatel, Shreyang, MD   80 mg at 09/28/16 2024  . topiramate (TOPAMAX) tablet 50 mg  50 mg Oral QHS Altamese DillingVachhani, Vaibhavkumar, MD   50 mg at 09/28/16 2014     Discharge Medications: Please see discharge summary for a list of discharge medications.  Relevant Imaging Results:  Relevant Lab Results:   Additional Information SS#: [id number]  Dorothy Dyer, Dorothy CrockerBailey M, LCSW

## 2016-09-29 NOTE — Care Management (Signed)
This RNCM received received call from CSW that patient's insurance has denied SNF and SNF would not take [facility]. Patient states she has rolling walker. Parent states her parents will provide transportation to home. Patient does not have preference of home health agency. [facility] to [facility]. No other RNCM needs.

## 2016-09-29 NOTE — Progress Notes (Signed)
Pt shoulder immobilizer is too small. Will try to find larger size.  Pt refused bed alarm, educated. Bed alarm turned off for patients independence and preference.

## 2016-09-29 NOTE — Progress Notes (Signed)
Initial Nutrition Assessment  DOCUMENTATION CODES:   Obesity unspecified  INTERVENTION:   1. Recommend 500mg  Calcium Citrate with Vitamin D3 TID (ordered as outpatient BID) 2. Recommend chewable multivitamin daily (ordered as outpatient) 3. Recommend sublingual or intranasal b12 350-56000mcg daily (receiving B-complex with vitamin C as outpatient) 4. Daily fish oil/omega 3 supplement 5. Continue Unjury chicken soup ordered BID, each supplement provides 120 calories and 20 grams of protein  NUTRITION DIAGNOSIS:   Inadequate oral intake related to poor appetite, nausea, other (see comment) (pain) as evidenced by per patient/family report.  GOAL:   Patient will meet greater than or equal to 90% of their needs  MONITOR:   PO intake, I & O's, Labs, Weight trends  REASON FOR ASSESSMENT:   Consult Assessment of nutrition requirement/status  ASSESSMENT:   Came in to speak to patient and she unloaded a series medical complaints. States she has been unable to take her medications prescribed to her following gastric bypass, because they cause her abdominal pain. States she has pain as soon as food enters her esophagus.   She says she has been told she is malnourished multiple times because she does not eat. Reports eating a "baby sized" dish of food throughout the day, bites at a time because she otherwise experiences GI pain. This causes her to feel hungry throughout the day. She no longer consumes protein shakes, feels that they are "too much" for her, leave her feeling full. She has several signs of micronutrient deficiencies, particularly peeling skin, pale nail beds, pale conjunctiva, brittle hair that is easily plucked,  lack of skin natural shine, and massive lower extremity edema with blisters. She does not exhibit fat depletions, but does exhibit muscle wasting in upper extremities. Unable to diagnose malnutrition at this time, but suspect significant wt loss masked by edema in her  lower extremities. Unsure of dry weight.  Suspect deficiencies of thiamine, b6, essential fatty acids, iron, zinc, protein Discussed with RN, patient ate well yesterday, 75% meal completion documented. I asked the patient about this and she states "I feel I have 100 pounds on my stomach"  Ate very little today. ? Psych issues.  Patient is discharging today. Discussed recommended bariatric medications with patient, she does not want to take these. Encouraged her to consume premier protein at home. Patient may need J-tube long term to prevent deficiencies.  Labs reviewed:  TBili 2.2,  HGB/HCT 9.9/28.8,  Medications reviewed and include:  KCL 40mEq BID, Senokot-S,  Diet Order:  Diet regular Room service appropriate? Yes; Fluid consistency: Thin  Skin:  Wound (see comment) (BL weeping wounds to legs)  Last BM:  09/26/2016  Height:   Ht Readings from Last 1 Encounters:  09/27/16 5\' 1"  (1.549 m)    Weight:   Wt Readings from Last 1 Encounters:  09/27/16 190 lb (86.2 kg)    Ideal Body Weight:  47.72 kg  BMI:  Body mass index is 35.9 kg/m.  Estimated Nutritional Needs:   Kcal:  1450-1753 (MSJ x1-1.2)  Protein:  103-120grams (1.2-1.4g/kg)   Fluid:  >/= 1.5L  EDUCATION NEEDS:   No education needs identified at this time  Dionne AnoWilliam M. Genora Arp, MS, RD LDN Inpatient Clinical Dietitian Pager [phone]

## 2016-09-29 NOTE — Progress Notes (Signed)
Patient is being discharged home with family. IV removed by Merry ProudBrandi, RN.  Reviewed discharge instructions and last dose given. Scripts sent with patient. Notified MD that home health order has not been placed. Allowed time for questions. Patient to find PCP and ortho MD within her insurance plan.

## 2016-09-29 NOTE — Progress Notes (Signed)
Patient was set for discharge [facility] [place] today on a 5 day LOG pending [employer] authorization. As EMS arrived to transport patient Clinical Child psychotherapistocial Worker (CSW) received a call from [employer] admissions coordinator at [facility] who stated that Monia [place] denied SNF stay because she is too acute and needs to stay in the hospital. CSW discussed case with MD who stated that patient is medically stable for D/C today. CSW discussed case with CSW director who approved 30 day LOG. CSW offered [facility] however [facility] denied it and stated they can no longer accept patient. CSW asked Doug admissions coordinator at [facility] if he could accept patient on a 30 day LOG. Per [employer] [facility] cannot accept patient. Patient reported that she cannot pay privately for SNF. Per CSW director the only option is for patient to discharge home with home health. CSW made patient aware of above who is agreeable to going home with home health. RN case manager aware of above. Please reconsult if future social work needs arise. CSW signing off.   [employer]iley Niccole Witthuhn, LCSW [phone]

## 2016-09-29 NOTE — Discharge Summary (Signed)
[facility] - [place] at [facility], [place] y.o., DOB [birth year], MRN [id number]. Admission date: 09/27/2016 Discharge Date 09/29/2016 Primary MD Donita Brooks, MD Admitting Physician Altamese Dilling, MD  Admission Diagnosis  Hypokalemia [E87.6] Neck pain [M54.2] Swelling [R60.9] Arm swelling [M79.89] Left hip pain [M25.552] Recurrent falls [R29.6] Pathologic clavicular fracture, right, initial encounter [W09.811B]  Discharge Diagnosis   Principal Problem: Pain Mildly displayed  Clavicular fracture Left hip pain Recurrent falls Avascular necrosis of the hip Hypokalemia Hypomagnesemia GERD Anxiety Essential hypertension Hypothyroidism      Hospital Course Dorothy Dyer  is a 41 y.o. 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 She does not have dental insurance issue is working with [facility] and they told she has total 11 teeth which need some workup and some of them need to be pulled out she is working with them by multiple appointments to take care of that. Meanwhile she had a fall and a right clavicular fracture a few weeks ago since then she is hurting and she could not walk properly because she has to use her arm to walk with a walker. Patient was admitted for further evaluation. She had electrolyte imbalances that were replaced. She is very deconditioned in need of rehabilitation which is currently being arranged. She needs to be seen by her primary orthopedist. She did have a displaced clavicular fracture which the orthopedics here recommended putting the patient in a sling and outpatient follow-up.            Consults  None  Significant Tests:  See full  reports for all details     Dg Chest 2 View  Result Date: 09/27/2016 CLINICAL DATA:  41 year old female with shortness of breath. Clavicle fracture a weeks ago. EXAM: CHEST  2 VIEW COMPARISON:  06/01/2016 chest radiographs. FINDINGS: Seated AP and lateral views of the chest. Midshaft right clavicle fracture with periosteal new bone and callus formation. Other visible osseous structures appear intact. Mildly larger lung volumes. The lungs appear clear. No pneumothorax or pleural effusion. Normal cardiac size and mediastinal contours. Visualized tracheal air column is within normal limits. Negative visible bowel gas pattern. IMPRESSION: 1. Midshaft right clavicle fracture with periosteal new bone formation. 2. Otherwise negative chest. Electronically Signed   By: Odessa Fleming M.D.   On: 09/27/2016 14:03   Ct Head Wo Contrast  Result Date: 09/27/2016 CLINICAL DATA:  Fall today. Hit head. Chronic neck pain. Initial encounter. EXAM: CT HEAD WITHOUT CONTRAST CT CERVICAL SPINE WITHOUT CONTRAST TECHNIQUE: Multidetector CT imaging of the head and cervical spine was performed following the standard protocol without intravenous contrast. Multiplanar CT image reconstructions of the cervical spine were also generated. COMPARISON:  Head CT 06/01/2016. Right clavicle radiographs 07/06/2016. FINDINGS: CT HEAD FINDINGS Brain: There is no evidence of acute infarct, intracranial hemorrhage, mass, midline shift, or extra-axial fluid collection. The ventricles and sulci are normal. Vascular: No hyperdense vessel. Skull: No fracture or focal osseous lesion. Sinuses/Orbits: Mild left sphenoid sinus mucosal thickening. Clear mastoid air cells. Unremarkable orbits. Other: None. CT CERVICAL SPINE FINDINGS Alignment: Cervical spine straightening.  No listhesis. Skull base and vertebrae: No acute fracture or destructive osseous process. Soft tissues and spinal canal: No prevertebral fluid or swelling.  No visible canal hematoma. Disc levels:  Minimal disc space narrowing and minimal right uncovertebral spurring at C5-6. Minimal left facet arthrosis at C3-4. No osseous spinal or neural foraminal stenosis. Upper chest: Clear lung apices. Other: The nondisplaced mid right clavicle fracture described on prior radiographs now demonstrates approximately 1.3 cm posterior displacement of the main lateral fragment as well as mild inferior displacement and overriding with surrounding callus formation and likely old hematoma. IMPRESSION: 1. No evidence of acute intracranial abnormality. 2. No cervical spine fracture or subluxation. 3. Subacute right mid clavicle fracture with new displacement compared to 06/2016 radiographs. Electronically Signed   By: Sebastian AcheAllen  Grady M.D.   On: 09/27/2016 11:12   Ct Cervical Spine Wo Contrast  Result Date: 09/27/2016 CLINICAL DATA:  Fall today. Hit head. Chronic neck pain. Initial encounter. EXAM: CT HEAD WITHOUT CONTRAST CT CERVICAL SPINE WITHOUT CONTRAST TECHNIQUE: Multidetector CT imaging of the head and cervical spine was performed following the standard protocol without intravenous contrast. Multiplanar CT image reconstructions of the cervical spine were also generated. COMPARISON:  Head CT 06/01/2016. Right clavicle radiographs 07/06/2016. FINDINGS: CT HEAD FINDINGS Brain: There is no evidence of acute infarct, intracranial hemorrhage, mass, midline shift, or extra-axial fluid collection. The ventricles and sulci are normal. Vascular: No hyperdense vessel. Skull: No fracture or focal osseous lesion. Sinuses/Orbits: Mild left sphenoid sinus mucosal thickening. Clear mastoid air cells. Unremarkable orbits. Other: None. CT CERVICAL SPINE FINDINGS Alignment: Cervical spine straightening.  No listhesis. Skull base and vertebrae: No acute fracture or destructive osseous process. Soft tissues and spinal canal: No prevertebral fluid or swelling. No visible canal hematoma. Disc levels: Minimal disc space narrowing and minimal right  uncovertebral spurring at C5-6. Minimal left facet arthrosis at C3-4. No osseous spinal or neural foraminal stenosis. Upper chest: Clear lung apices. Other: The nondisplaced mid right clavicle fracture described on prior radiographs now demonstrates approximately 1.3 cm posterior displacement of the main lateral fragment as well as mild inferior displacement and overriding with surrounding callus formation and likely old hematoma. IMPRESSION: 1. No evidence of acute intracranial abnormality. 2. No cervical spine fracture or subluxation. 3. Subacute right mid clavicle fracture with new displacement compared to 06/2016 radiographs. Electronically Signed   By: Sebastian AcheAllen  Grady M.D.   On: 09/27/2016 11:12   [place] Venous Img Upper Uni Right  Result Date: 09/27/2016 CLINICAL DATA:  Right upper extremity swelling EXAM: RIGHT UPPER EXTREMITY VENOUS DOPPLER ULTRASOUND TECHNIQUE: Gray-scale sonography with graded compression, as well as color Doppler and duplex ultrasound were performed to evaluate the upper extremity deep venous system from the level of the subclavian vein and including the jugular, axillary, basilic, radial, ulnar and upper cephalic vein. Spectral Doppler was utilized to evaluate flow at rest and with distal augmentation maneuvers. COMPARISON:  None. FINDINGS: Contralateral Subclavian Vein: Respiratory phasicity is normal and symmetric with the symptomatic side. No evidence of thrombus. Normal compressibility. Internal Jugular Vein: No evidence of thrombus. Normal compressibility, respiratory phasicity and response to augmentation. Subclavian Vein: No evidence of thrombus. Normal compressibility, respiratory phasicity and response to augmentation. Axillary Vein: No evidence of thrombus. Normal compressibility, respiratory phasicity and response to augmentation. Cephalic Vein: No evidence of thrombus. Normal compressibility, respiratory phasicity and response to augmentation. Basilic Vein: No evidence of  thrombus. Normal compressibility, respiratory phasicity and response to augmentation. Brachial Veins: No evidence of thrombus. Normal compressibility, respiratory phasicity and response to augmentation. Radial Veins: No evidence of thrombus. Normal compressibility, respiratory phasicity and response to augmentation. Ulnar Veins: No  evidence of thrombus. Normal compressibility, respiratory phasicity and response to augmentation. Venous Reflux:  None visualized. Other Findings:  None visualized. IMPRESSION: No evidence of DVT within the right upper extremity. Electronically Signed   By: Alcide Clever M.D.   On: 09/27/2016 12:05       Today   Subjective:   Dorothy Dyer has pain in multiple areas of her body  Objective:   Blood pressure 112/70, pulse 80, temperature 98 F (36.7 C), temperature source Oral, resp. rate 18, height 5\' 1"  (1.549 m), weight 190 lb (86.2 kg), SpO2 100 %.  . No intake or output data in the 24 hours ending 09/29/16 1257  Exam VITAL SIGNS: Blood pressure 112/70, pulse 80, temperature 98 F (36.7 C), temperature source Oral, resp. rate 18, height 5\' 1"  (1.549 m), weight 190 lb (86.2 kg), SpO2 100 %.  GENERAL:  40 y.o.-year-old patient lying in the bed with no acute distress.  EYES: Pupils equal, round, reactive to light and accommodation. No scleral icterus. Extraocular muscles intact.  HEENT: Head atraumatic, normocephalic. Oropharynx and nasopharynx clear.  NECK:  Supple, no jugular venous distention. No thyroid enlargement, no tenderness.  LUNGS: Normal breath sounds bilaterally, no wheezing, rales,rhonchi or crepitation. No use of accessory muscles of respiration.  CARDIOVASCULAR: S1, S2 normal. No murmurs, rubs, or gallops.  ABDOMEN: Soft, nontender, nondistended. Bowel sounds present. No organomegaly or mass.  EXTREMITIES: Bilateral lower extremity edema with some renal stasis changes NEUROLOGIC: Cranial nerves II through XII are intact. Muscle strength 5/5 in  all extremities. Sensation intact. Gait not checked.  PSYCHIATRIC: The patient is alert and oriented x 3.  SKIN: No obvious rash, lesion, or ulcer.   Data Review     CBC w Diff:  Lab Results  Component Value Date   WBC 4.1 09/28/2016   HGB 9.9 (L) 09/28/2016   HCT 28.8 (L) 09/28/2016   PLT 99 (L) 09/28/2016   LYMPHOPCT 30 09/27/2016   MONOPCT 12 09/27/2016   EOSPCT 3 09/27/2016   BASOPCT 1 09/27/2016   CMP:  Lab Results  Component Value Date   NA 140 09/29/2016   K 4.1 09/29/2016   CL 107 09/29/2016   CO2 26 09/29/2016   BUN 12 09/29/2016   CREATININE 0.79 09/29/2016   CREATININE 0.70 07/31/2016   PROT 4.8 (L) 09/27/2016   ALBUMIN 1.8 (L) 09/27/2016   BILITOT 2.2 (H) 09/27/2016   ALKPHOS 189 (H) 09/27/2016   AST 72 (H) 09/27/2016   ALT 39 09/27/2016  .  Micro Results No results found for this or any previous visit (from the past 240 hour(s)).      Code Status Orders        Start     Ordered   09/27/16 1812  Full code  Continuous     09/27/16 1811    Code Status History    Date Active Date Inactive Code Status Order ID Comments User Context   06/01/2016  6:34 PM 06/03/2016 10:42 PM Full Code [id number]  Russella Dar, NP Inpatient   02/11/2016  2:22 AM 02/11/2016 10:15 PM Full Code [id number]  Eduard Clos, MD Inpatient           Contact information for follow-up providers    primary orthopedics Follow up in 1 week(s).   Why:  humerus fx and avn of hip       Donita Brooks, MD Follow up in 1 week(s).   Specialty:  Family Medicine Contact information: [phone] [place]  [address] [place] [address] [phone]            Contact information for after-discharge care    Destination    [facility] Follow up.   Specialty:  Skilled Nursing Facility Contact information: [address] [place] [address] [phone]                  Discharge Medications   Allergies as of  09/29/2016      Reactions   Ibuprofen Nausea Only, Other (See Comments)   Bad stomach pains   Toradol [ketorolac Tromethamine] Anxiety   Aspirin Hives   Cleocin [clindamycin Hcl] Itching, Rash      Medication List    STOP taking these medications   cephALEXin 500 MG capsule Commonly known as:  KEFLEX     TAKE these medications   acetaminophen 325 MG tablet Commonly known as:  TYLENOL Take 2 tablets (650 mg total) by mouth every 6 (six) hours as needed for mild pain (or Fever >/= 101).   ALPRAZolam 0.5 MG tablet Commonly known as:  XANAX Take 1 tablet (0.5 mg total) by mouth 3 (three) times daily as needed for anxiety.   B-complex with vitamin C tablet Take 1 tablet by mouth daily.   calcium-vitamin D 500-200 MG-UNIT tablet Commonly known as:  OSCAL WITH D Take 2 tablets by mouth daily with breakfast.   docusate sodium 100 MG capsule Commonly known as:  COLACE Take 1 capsule (100 mg total) by mouth 2 (two) times daily as needed for mild constipation.   furosemide 40 MG tablet Commonly known as:  LASIX Take 1 tablet (40 mg total) by mouth daily as needed. What changed:  reasons to take this   levothyroxine 137 MCG tablet Commonly known as:  SYNTHROID, LEVOTHROID Take 137 mcg by mouth every other day. Alternating with 125 mcg tablet. What changed:  Another medication with the same name was changed. Make sure you understand how and when to take each.   levothyroxine 125 MCG tablet Commonly known as:  SYNTHROID, LEVOTHROID TAKE 1 TABLET EVERY DAY What changed:  See the new instructions.   LYRICA 225 MG capsule Generic drug:  pregabalin TAKE 1 CAPSULE BY MOUTH TWICE A DAY   Magnesium Oxide 400 MG Caps Take 1 capsule (400 mg total) by mouth 2 (two) times daily.   metolazone 5 MG tablet Commonly known as:  ZAROXOLYN TAKE 1 TABLET BY MOUTH EVERY DAY 30 MINS BEFORE LASIX 1-2 TIMES PER WEEK.   multivitamin with minerals Tabs tablet Take 1 tablet by mouth 2 (two)  times daily.   naproxen sodium 220 MG tablet Commonly known as:  ANAPROX Take 220 mg by mouth 2 (two) times daily with a meal.   nortriptyline 75 MG capsule Commonly known as:  PAMELOR TAKE ONE CAPSULE BY MOUTH EVERY EVENING AT BEDTIME   omega-3 acid ethyl esters 1 g capsule Commonly known as:  LOVAZA Take 1 capsule (1 g total) by mouth daily.   oxyCODONE-acetaminophen 5-325 MG tablet Commonly known as:  PERCOCET/ROXICET Take 1 tablet by mouth every 6 (six) hours as needed for severe pain.   potassium chloride SA 20 MEQ tablet Commonly known as:  K-DUR,KLOR-CON Take 1 tablet (20 mEq total) by mouth 2 (two) times daily as needed (when taking lasix). What changed:  when to take this   protein supplement Powd Commonly known as:  UNJURY CHICKEN SOUP Take 27 g (8 oz total)  by mouth 2 (two) times daily after a meal.   RABEprazole 20 MG tablet Commonly known as:  ACIPHEX 1 tab po bid What changed:  how much to take  how to take this  when to take this  additional instructions   simethicone 80 MG chewable tablet Commonly known as:  MYLICON Chew 1 tablet (80 mg total) by mouth 4 (four) times daily as needed for flatulence.   thiamine 100 MG tablet Take 1 tablet (100 mg total) by mouth daily.   topiramate 50 MG tablet Commonly known as:  TOPAMAX TAKE 1 TABLET BY MOUTH AT BEDTIME What changed:  See the new instructions.   traMADol 50 MG tablet Commonly known as:  ULTRAM Take 1 tablet (50 mg total) by mouth every 6 (six) hours as needed for moderate pain or severe pain.          Total Time in preparing paper work, data evaluation and todays exam - 35 minutes  Auburn Bilberry M.D on 09/29/2016 at 12:57 PM  [facility]   Office  [phone]

## 2016-09-29 NOTE — Discharge Instructions (Signed)
[facility] - [place] at [facility]  DIET:  Regular diet  DISCHARGE CONDITION:  Stable  ACTIVITY:  Activity as tolerated  OXYGEN:  Home Oxygen: No.   Oxygen Delivery: room air  DISCHARGE LOCATION:  snf  Right arm immoilizer wear at all times   ADDITIONAL DISCHARGE INSTRUCTION:   If you experience worsening of your admission symptoms, develop shortness of breath, life threatening emergency, suicidal or homicidal thoughts you must seek medical attention immediately by calling 911 or calling your MD immediately  if symptoms less severe.  You Must read complete instructions/literature along with all the possible adverse reactions/side effects for all the Medicines you take and that have been prescribed to you. Take any new Medicines after you have completely understood and accpet all the possible adverse reactions/side effects.   Please note  You were cared for by a hospitalist during your hospital stay. If you have any questions about your discharge medications or the care you received while you were in the hospital after you are discharged, you can call the unit and asked to speak with the hospitalist on call if the hospitalist that took care of you is not available. Once you are discharged, your primary care physician will handle any further medical issues. Please note that NO REFILLS for any discharge medications will be authorized once you are discharged, as it is imperative that you return to your primary care physician (or establish a relationship with a primary care physician if you do not have one) for your aftercare needs so that they can reassess your need for medications and monitor your lab values.

## 2016-09-29 NOTE — Progress Notes (Signed)
Report called to [place], [facility]. EMS notified of need for transport.

## 2016-10-02 DIAGNOSIS — R296 Repeated falls: Secondary | ICD-10-CM | POA: Diagnosis not present

## 2016-10-02 DIAGNOSIS — M542 Cervicalgia: Secondary | ICD-10-CM | POA: Diagnosis not present

## 2016-10-02 DIAGNOSIS — K219 Gastro-esophageal reflux disease without esophagitis: Secondary | ICD-10-CM | POA: Diagnosis not present

## 2016-10-02 DIAGNOSIS — Z79891 Long term (current) use of opiate analgesic: Secondary | ICD-10-CM | POA: Diagnosis not present

## 2016-10-02 DIAGNOSIS — Z791 Long term (current) use of non-steroidal anti-inflammatories (NSAID): Secondary | ICD-10-CM | POA: Diagnosis not present

## 2016-10-02 DIAGNOSIS — I10 Essential (primary) hypertension: Secondary | ICD-10-CM | POA: Diagnosis not present

## 2016-10-02 DIAGNOSIS — S42021D Displaced fracture of shaft of right clavicle, subsequent encounter for fracture with routine healing: Secondary | ICD-10-CM | POA: Diagnosis not present

## 2016-10-02 DIAGNOSIS — M25552 Pain in left hip: Secondary | ICD-10-CM | POA: Diagnosis not present

## 2016-10-02 DIAGNOSIS — R69 Illness, unspecified: Secondary | ICD-10-CM | POA: Diagnosis not present

## 2016-10-02 DIAGNOSIS — E039 Hypothyroidism, unspecified: Secondary | ICD-10-CM | POA: Diagnosis not present

## 2016-10-03 ENCOUNTER — Other Ambulatory Visit: Payer: Self-pay | Admitting: Family Medicine

## 2016-10-03 NOTE — Telephone Encounter (Signed)
Ok, last refill

## 2016-10-03 NOTE — Telephone Encounter (Signed)
Medication called to pharmacy. 

## 2016-10-03 NOTE — Telephone Encounter (Signed)
Ok to refill?  [facility] until 10/08/2016.

## 2016-10-06 DIAGNOSIS — R296 Repeated falls: Secondary | ICD-10-CM | POA: Diagnosis not present

## 2016-10-06 DIAGNOSIS — Z791 Long term (current) use of non-steroidal anti-inflammatories (NSAID): Secondary | ICD-10-CM | POA: Diagnosis not present

## 2016-10-06 DIAGNOSIS — S42021D Displaced fracture of shaft of right clavicle, subsequent encounter for fracture with routine healing: Secondary | ICD-10-CM | POA: Diagnosis not present

## 2016-10-06 DIAGNOSIS — R69 Illness, unspecified: Secondary | ICD-10-CM | POA: Diagnosis not present

## 2016-10-06 DIAGNOSIS — M542 Cervicalgia: Secondary | ICD-10-CM | POA: Diagnosis not present

## 2016-10-06 DIAGNOSIS — M25552 Pain in left hip: Secondary | ICD-10-CM | POA: Diagnosis not present

## 2016-10-06 DIAGNOSIS — Z79891 Long term (current) use of opiate analgesic: Secondary | ICD-10-CM | POA: Diagnosis not present

## 2016-10-06 DIAGNOSIS — I10 Essential (primary) hypertension: Secondary | ICD-10-CM | POA: Diagnosis not present

## 2016-10-06 DIAGNOSIS — E039 Hypothyroidism, unspecified: Secondary | ICD-10-CM | POA: Diagnosis not present

## 2016-10-06 DIAGNOSIS — K219 Gastro-esophageal reflux disease without esophagitis: Secondary | ICD-10-CM | POA: Diagnosis not present

## 2016-10-11 DIAGNOSIS — R296 Repeated falls: Secondary | ICD-10-CM | POA: Diagnosis not present

## 2016-10-11 DIAGNOSIS — I10 Essential (primary) hypertension: Secondary | ICD-10-CM | POA: Diagnosis not present

## 2016-10-11 DIAGNOSIS — Z791 Long term (current) use of non-steroidal anti-inflammatories (NSAID): Secondary | ICD-10-CM | POA: Diagnosis not present

## 2016-10-11 DIAGNOSIS — K219 Gastro-esophageal reflux disease without esophagitis: Secondary | ICD-10-CM | POA: Diagnosis not present

## 2016-10-11 DIAGNOSIS — M542 Cervicalgia: Secondary | ICD-10-CM | POA: Diagnosis not present

## 2016-10-11 DIAGNOSIS — M25552 Pain in left hip: Secondary | ICD-10-CM | POA: Diagnosis not present

## 2016-10-11 DIAGNOSIS — R69 Illness, unspecified: Secondary | ICD-10-CM | POA: Diagnosis not present

## 2016-10-11 DIAGNOSIS — E039 Hypothyroidism, unspecified: Secondary | ICD-10-CM | POA: Diagnosis not present

## 2016-10-11 DIAGNOSIS — Z79891 Long term (current) use of opiate analgesic: Secondary | ICD-10-CM | POA: Diagnosis not present

## 2016-10-11 DIAGNOSIS — S42021D Displaced fracture of shaft of right clavicle, subsequent encounter for fracture with routine healing: Secondary | ICD-10-CM | POA: Diagnosis not present

## 2016-10-14 ENCOUNTER — Emergency Department: Payer: Medicare HMO

## 2016-10-14 ENCOUNTER — Inpatient Hospital Stay
Admission: EM | Admit: 2016-10-14 | Discharge: 2016-10-16 | DRG: 603 | Disposition: A | Discharge status: Home Health | Payer: Medicare HMO | Attending: Internal Medicine | Admitting: Internal Medicine

## 2016-10-14 DIAGNOSIS — M84411D Pathological fracture, right shoulder, subsequent encounter for fracture with routine healing: Secondary | ICD-10-CM | POA: Diagnosis present

## 2016-10-14 DIAGNOSIS — R52 Pain, unspecified: Secondary | ICD-10-CM | POA: Diagnosis not present

## 2016-10-14 DIAGNOSIS — M79673 Pain in unspecified foot: Secondary | ICD-10-CM | POA: Diagnosis not present

## 2016-10-14 DIAGNOSIS — Z833 Family history of diabetes mellitus: Secondary | ICD-10-CM

## 2016-10-14 DIAGNOSIS — L03115 Cellulitis of right lower limb: Secondary | ICD-10-CM | POA: Diagnosis present

## 2016-10-14 DIAGNOSIS — Z9181 History of falling: Secondary | ICD-10-CM

## 2016-10-14 DIAGNOSIS — Z8249 Family history of ischemic heart disease and other diseases of the circulatory system: Secondary | ICD-10-CM | POA: Diagnosis not present

## 2016-10-14 DIAGNOSIS — K219 Gastro-esophageal reflux disease without esophagitis: Secondary | ICD-10-CM | POA: Diagnosis present

## 2016-10-14 DIAGNOSIS — E46 Unspecified protein-calorie malnutrition: Secondary | ICD-10-CM | POA: Diagnosis not present

## 2016-10-14 DIAGNOSIS — L03116 Cellulitis of left lower limb: Secondary | ICD-10-CM | POA: Diagnosis not present

## 2016-10-14 DIAGNOSIS — M810 Age-related osteoporosis without current pathological fracture: Secondary | ICD-10-CM | POA: Diagnosis present

## 2016-10-14 DIAGNOSIS — E039 Hypothyroidism, unspecified: Secondary | ICD-10-CM | POA: Diagnosis present

## 2016-10-14 DIAGNOSIS — M7989 Other specified soft tissue disorders: Secondary | ICD-10-CM

## 2016-10-14 DIAGNOSIS — D539 Nutritional anemia, unspecified: Secondary | ICD-10-CM | POA: Diagnosis present

## 2016-10-14 DIAGNOSIS — F17210 Nicotine dependence, cigarettes, uncomplicated: Secondary | ICD-10-CM | POA: Diagnosis present

## 2016-10-14 DIAGNOSIS — Z825 Family history of asthma and other chronic lower respiratory diseases: Secondary | ICD-10-CM

## 2016-10-14 DIAGNOSIS — E876 Hypokalemia: Secondary | ICD-10-CM | POA: Diagnosis present

## 2016-10-14 DIAGNOSIS — Z9111 Patient's noncompliance with dietary regimen: Secondary | ICD-10-CM | POA: Diagnosis not present

## 2016-10-14 DIAGNOSIS — Z9884 Bariatric surgery status: Secondary | ICD-10-CM

## 2016-10-14 DIAGNOSIS — I10 Essential (primary) hypertension: Secondary | ICD-10-CM | POA: Diagnosis present

## 2016-10-14 DIAGNOSIS — L4050 Arthropathic psoriasis, unspecified: Secondary | ICD-10-CM | POA: Diagnosis present

## 2016-10-14 DIAGNOSIS — R531 Weakness: Secondary | ICD-10-CM

## 2016-10-14 DIAGNOSIS — J441 Chronic obstructive pulmonary disease with (acute) exacerbation: Secondary | ICD-10-CM | POA: Diagnosis present

## 2016-10-14 DIAGNOSIS — E440 Moderate protein-calorie malnutrition: Secondary | ICD-10-CM | POA: Diagnosis present

## 2016-10-14 DIAGNOSIS — Z716 Tobacco abuse counseling: Secondary | ICD-10-CM | POA: Diagnosis not present

## 2016-10-14 DIAGNOSIS — N390 Urinary tract infection, site not specified: Secondary | ICD-10-CM | POA: Diagnosis present

## 2016-10-14 DIAGNOSIS — R600 Localized edema: Secondary | ICD-10-CM | POA: Diagnosis present

## 2016-10-14 DIAGNOSIS — L8990 Pressure ulcer of unspecified site, unspecified stage: Secondary | ICD-10-CM | POA: Insufficient documentation

## 2016-10-14 DIAGNOSIS — Z6833 Body mass index (BMI) 33.0-33.9, adult: Secondary | ICD-10-CM | POA: Diagnosis not present

## 2016-10-14 DIAGNOSIS — R000 Tachycardia, unspecified: Secondary | ICD-10-CM | POA: Diagnosis not present

## 2016-10-14 DIAGNOSIS — Z8349 Family history of other endocrine, nutritional and metabolic diseases: Secondary | ICD-10-CM

## 2016-10-14 LAB — CBC WITH DIFFERENTIAL/PLATELET
BASOS ABS: 0.1 10*3/uL (ref 0–0.1)
Basophils Relative: 1 %
EOS PCT: 2 %
Eosinophils Absolute: 0.1 10*3/uL (ref 0–0.7)
HEMATOCRIT: 37.2 % (ref 35.0–47.0)
Hemoglobin: 12.3 g/dL (ref 12.0–16.0)
LYMPHS ABS: 2.3 10*3/uL (ref 1.0–3.6)
LYMPHS PCT: 31 %
MCH: 35.7 pg — AB (ref 26.0–34.0)
MCHC: 33.1 g/dL (ref 32.0–36.0)
MCV: 107.8 fL — AB (ref 80.0–100.0)
MONO ABS: 0.8 10*3/uL (ref 0.2–0.9)
Monocytes Relative: 10 %
NEUTROS ABS: 4.3 10*3/uL (ref 1.4–6.5)
Neutrophils Relative %: 56 %
PLATELETS: 173 10*3/uL (ref 150–440)
RBC: 3.45 MIL/uL — AB (ref 3.80–5.20)
RDW: 15.3 % — ABNORMAL HIGH (ref 11.5–14.5)
WBC: 7.6 10*3/uL (ref 3.6–11.0)

## 2016-10-14 LAB — COMPREHENSIVE METABOLIC PANEL
ALT: 42 U/L (ref 14–54)
AST: 39 U/L (ref 15–41)
Albumin: 1.7 g/dL — ABNORMAL LOW (ref 3.5–5.0)
Alkaline Phosphatase: 170 U/L — ABNORMAL HIGH (ref 38–126)
Anion gap: 8 (ref 5–15)
BILIRUBIN TOTAL: 1.6 mg/dL — AB (ref 0.3–1.2)
BUN: 9 mg/dL (ref 6–20)
CALCIUM: 7.9 mg/dL — AB (ref 8.9–10.3)
CO2: 24 mmol/L (ref 22–32)
CREATININE: 0.66 mg/dL (ref 0.44–1.00)
Chloride: 112 mmol/L — ABNORMAL HIGH (ref 101–111)
Glucose, Bld: 82 mg/dL (ref 65–99)
Potassium: 3.5 mmol/L (ref 3.5–5.1)
Sodium: 144 mmol/L (ref 135–145)
TOTAL PROTEIN: 4.9 g/dL — AB (ref 6.5–8.1)

## 2016-10-14 LAB — PROTIME-INR
INR: 0.99
Prothrombin Time: 13.1 seconds (ref 11.4–15.2)

## 2016-10-14 LAB — LACTATE DEHYDROGENASE: LDH: 363 U/L — AB (ref 98–192)

## 2016-10-14 LAB — TROPONIN I

## 2016-10-14 LAB — LACTIC ACID, PLASMA: Lactic Acid, Venous: 1.6 mmol/L (ref 0.5–1.9)

## 2016-10-14 LAB — TSH: TSH: 6.198 u[IU]/mL — ABNORMAL HIGH (ref 0.350–4.500)

## 2016-10-14 LAB — MAGNESIUM: Magnesium: 1.8 mg/dL (ref 1.7–2.4)

## 2016-10-14 MED ORDER — PREGABALIN 75 MG PO CAPS
225.0000 mg | ORAL_CAPSULE | Freq: Two times a day (BID) | ORAL | Status: DC
  Administered 2016-10-15 – 2016-10-16 (×4): 225 mg via ORAL
  Filled 2016-10-14 (×4): qty 3

## 2016-10-14 MED ORDER — ENOXAPARIN SODIUM 40 MG/0.4ML ~~LOC~~ SOLN
40.0000 mg | SUBCUTANEOUS | Status: DC
  Administered 2016-10-15 (×2): 40 mg via SUBCUTANEOUS
  Filled 2016-10-14 (×2): qty 0.4

## 2016-10-14 MED ORDER — ADULT MULTIVITAMIN W/MINERALS CH
1.0000 | ORAL_TABLET | Freq: Every day | ORAL | Status: DC
  Administered 2016-10-15 – 2016-10-16 (×2): 1 via ORAL
  Filled 2016-10-14 (×2): qty 1

## 2016-10-14 MED ORDER — IPRATROPIUM-ALBUTEROL 0.5-2.5 (3) MG/3ML IN SOLN
3.0000 mL | RESPIRATORY_TRACT | Status: DC
  Filled 2016-10-14: qty 3

## 2016-10-14 MED ORDER — SIMETHICONE 80 MG PO CHEW
80.0000 mg | CHEWABLE_TABLET | Freq: Four times a day (QID) | ORAL | Status: DC | PRN
  Filled 2016-10-14: qty 1

## 2016-10-14 MED ORDER — POTASSIUM CHLORIDE CRYS ER 20 MEQ PO TBCR
20.0000 meq | EXTENDED_RELEASE_TABLET | Freq: Every day | ORAL | Status: DC
  Administered 2016-10-15 – 2016-10-16 (×2): 20 meq via ORAL
  Filled 2016-10-14 (×2): qty 1

## 2016-10-14 MED ORDER — PANTOPRAZOLE SODIUM 40 MG PO TBEC
40.0000 mg | DELAYED_RELEASE_TABLET | Freq: Every day | ORAL | Status: DC
  Administered 2016-10-16: 40 mg via ORAL
  Filled 2016-10-14: qty 1

## 2016-10-14 MED ORDER — PIPERACILLIN-TAZOBACTAM 3.375 G IVPB 30 MIN
3.3750 g | Freq: Once | INTRAVENOUS | Status: AC
  Administered 2016-10-14: 3.375 g via INTRAVENOUS

## 2016-10-14 MED ORDER — METOLAZONE 5 MG PO TABS
5.0000 mg | ORAL_TABLET | ORAL | Status: DC
  Filled 2016-10-14 (×2): qty 1

## 2016-10-14 MED ORDER — DOCUSATE SODIUM 100 MG PO CAPS
100.0000 mg | ORAL_CAPSULE | Freq: Two times a day (BID) | ORAL | Status: DC
  Administered 2016-10-15 – 2016-10-16 (×4): 100 mg via ORAL
  Filled 2016-10-14 (×4): qty 1

## 2016-10-14 MED ORDER — OMEGA-3-ACID ETHYL ESTERS 1 G PO CAPS
1.0000 g | ORAL_CAPSULE | Freq: Every day | ORAL | Status: DC
  Administered 2016-10-15 – 2016-10-16 (×2): 1 g via ORAL
  Filled 2016-10-14 (×2): qty 1

## 2016-10-14 MED ORDER — PIPERACILLIN-TAZOBACTAM 3.375 G IVPB 30 MIN
INTRAVENOUS | Status: AC
  Administered 2016-10-14: 3.375 g via INTRAVENOUS
  Filled 2016-10-14: qty 50

## 2016-10-14 MED ORDER — TOPIRAMATE 25 MG PO TABS
50.0000 mg | ORAL_TABLET | Freq: Every day | ORAL | Status: DC
  Administered 2016-10-15 (×2): 50 mg via ORAL
  Filled 2016-10-14 (×3): qty 2

## 2016-10-14 MED ORDER — VANCOMYCIN HCL IN DEXTROSE 750-5 MG/150ML-% IV SOLN
750.0000 mg | Freq: Two times a day (BID) | INTRAVENOUS | Status: DC
  Administered 2016-10-15 – 2016-10-16 (×3): 750 mg via INTRAVENOUS
  Filled 2016-10-14 (×5): qty 150

## 2016-10-14 MED ORDER — MAGNESIUM OXIDE 400 (241.3 MG) MG PO TABS
400.0000 mg | ORAL_TABLET | Freq: Two times a day (BID) | ORAL | Status: DC
  Administered 2016-10-15 – 2016-10-16 (×4): 400 mg via ORAL
  Filled 2016-10-14 (×4): qty 1

## 2016-10-14 MED ORDER — TRAMADOL HCL 50 MG PO TABS
50.0000 mg | ORAL_TABLET | Freq: Four times a day (QID) | ORAL | Status: DC | PRN
  Administered 2016-10-15 (×2): 50 mg via ORAL
  Filled 2016-10-14 (×2): qty 1

## 2016-10-14 MED ORDER — SODIUM CHLORIDE 0.9 % IV SOLN: 250.0000 mL | INTRAVENOUS | Status: DC | PRN

## 2016-10-14 MED ORDER — SODIUM CHLORIDE 0.9% FLUSH: 3.0000 mL | INTRAVENOUS | Status: DC | PRN

## 2016-10-14 MED ORDER — DOCUSATE SODIUM 100 MG PO CAPS: 100.0000 mg | ORAL_CAPSULE | Freq: Two times a day (BID) | ORAL | Status: DC | PRN

## 2016-10-14 MED ORDER — FUROSEMIDE 40 MG PO TABS
40.0000 mg | ORAL_TABLET | Freq: Every day | ORAL | Status: DC
  Administered 2016-10-15 – 2016-10-16 (×2): 40 mg via ORAL
  Filled 2016-10-14 (×2): qty 1

## 2016-10-14 MED ORDER — ALPRAZOLAM 0.5 MG PO TABS: 0.5000 mg | ORAL_TABLET | Freq: Three times a day (TID) | ORAL | Status: DC | PRN

## 2016-10-14 MED ORDER — VANCOMYCIN HCL IN DEXTROSE 1-5 GM/200ML-% IV SOLN
1000.0000 mg | Freq: Once | INTRAVENOUS | Status: AC
Start: 2016-10-14 — End: 2016-10-14
  Administered 2016-10-14: 1000 mg via INTRAVENOUS
  Filled 2016-10-14: qty 200

## 2016-10-14 MED ORDER — OXYCODONE-ACETAMINOPHEN 5-325 MG PO TABS
1.0000 | ORAL_TABLET | Freq: Four times a day (QID) | ORAL | Status: DC | PRN
  Administered 2016-10-14 – 2016-10-16 (×5): 1 via ORAL
  Filled 2016-10-14 (×5): qty 1

## 2016-10-14 MED ORDER — ONDANSETRON HCL 4 MG PO TABS: 4.0000 mg | ORAL_TABLET | Freq: Four times a day (QID) | ORAL | Status: DC | PRN

## 2016-10-14 MED ORDER — NORTRIPTYLINE HCL 25 MG PO CAPS
75.0000 mg | ORAL_CAPSULE | Freq: Every day | ORAL | Status: DC
  Administered 2016-10-15 (×2): 75 mg via ORAL
  Filled 2016-10-14 (×3): qty 3

## 2016-10-14 MED ORDER — NICOTINE 14 MG/24HR TD PT24
14.0000 mg | MEDICATED_PATCH | Freq: Every day | TRANSDERMAL | Status: DC
  Administered 2016-10-15 – 2016-10-16 (×3): 14 mg via TRANSDERMAL
  Filled 2016-10-14 (×3): qty 1

## 2016-10-14 MED ORDER — VITAMIN B-1 100 MG PO TABS
100.0000 mg | ORAL_TABLET | Freq: Every day | ORAL | Status: DC
  Administered 2016-10-15 – 2016-10-16 (×2): 100 mg via ORAL
  Filled 2016-10-14 (×2): qty 1

## 2016-10-14 MED ORDER — PIPERACILLIN-TAZOBACTAM 3.375 G IVPB
3.3750 g | Freq: Three times a day (TID) | INTRAVENOUS | Status: DC
  Administered 2016-10-15 – 2016-10-16 (×4): 3.375 g via INTRAVENOUS
  Filled 2016-10-14 (×4): qty 50

## 2016-10-14 MED ORDER — ACETAMINOPHEN 650 MG RE SUPP: 650.0000 mg | Freq: Four times a day (QID) | RECTAL | Status: DC | PRN

## 2016-10-14 MED ORDER — MOMETASONE FURO-FORMOTEROL FUM 100-5 MCG/ACT IN AERO
2.0000 | INHALATION_SPRAY | Freq: Two times a day (BID) | RESPIRATORY_TRACT | Status: DC
  Administered 2016-10-15 – 2016-10-16 (×4): 2 via RESPIRATORY_TRACT
  Filled 2016-10-14: qty 8.8

## 2016-10-14 MED ORDER — LEVOTHYROXINE SODIUM 137 MCG PO TABS
137.0000 ug | ORAL_TABLET | Freq: Every day | ORAL | Status: DC
  Administered 2016-10-15 – 2016-10-16 (×2): 137 ug via ORAL
  Filled 2016-10-14 (×2): qty 1

## 2016-10-14 MED ORDER — ACETAMINOPHEN 325 MG PO TABS: 650.0000 mg | ORAL_TABLET | Freq: Four times a day (QID) | ORAL | Status: DC | PRN

## 2016-10-14 MED ORDER — ONDANSETRON HCL 4 MG/2ML IJ SOLN: 4.0000 mg | Freq: Four times a day (QID) | INTRAMUSCULAR | Status: DC | PRN

## 2016-10-14 MED ORDER — SODIUM CHLORIDE 0.9% FLUSH
3.0000 mL | Freq: Two times a day (BID) | INTRAVENOUS | Status: DC
  Administered 2016-10-14 – 2016-10-16 (×4): 3 mL via INTRAVENOUS

## 2016-10-14 NOTE — ED Provider Notes (Signed)
[facility] Emergency Department Provider Note  ____________________________________________   First MD Initiated Contact with Patient 10/14/16 1634     (approximate)  I have reviewed the triage vital signs and the nursing notes.   HISTORY  Chief Complaint Weakness   HPI Dorothy Dyer FilterChandler is a 41 y.o. 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 She says that since she was last admitted she has had exfoliation and increased pain and redness to her bilateral lower extremities. 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 She denies any fever.   Past Medical History:  Diagnosis Date  . Abdominal pain, chronic, generalized   . Acid reflux   . Anemia   . Anxiety   . Arthritis   . AVN of femur (HCC)    left hip  . Clotting disorder (HCC)    undetermined  . Cystitis   . 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 Hypertension   . Neuromuscular disorder (HCC)   . Osteoporosis   . Psoriasis   . Thyroid disease     Patient Active Problem List   Diagnosis Date Noted  . Pain 09/27/2016  . Clavicular fracture 09/27/2016  . Abdominal pain, chronic, generalized   . Protein-calorie malnutrition, severe 06/02/2016  . Acute encephalopathy 06/01/2016  . Chronic left hip pain 06/01/2016  . Overdose of opiate or related narcotic, accidental or unintentional, initial encounter 06/01/2016  . Lower extremity cellulitis 06/01/2016  . 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 Left knee pain 06/01/2016  . Bilateral lower extremity edema 06/01/2016  . Acute hypokalemia 02/11/2016  . Hypoglycemia 02/11/2016  . Marijuana abuse 02/11/2016  . Chronic narcotic use 02/11/2016  . AKI (acute  kidney injury) (HCC) 02/10/2016  . AVN of femur (HCC)   . 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 Hoarseness 06/17/2014  . 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 Fibromyalgia 06/17/2014  . Chronic abdominal pain 06/17/2014  . Knee pain, bilateral 06/17/2014  . Vitamin D deficiency 06/17/2014  . Vitamin B12 deficiency 06/17/2014  . Iron deficiency anemia 06/17/2014  . Thyroid activity decreased 06/17/2014  . Abnormal Pap smear of cervix 06/17/2014  . Anemia   . Anxiety   . Arthritis   . Acid reflux   . Neuromuscular disorder (HCC)   . Hypertension   . Osteoporosis   . HPV (human papilloma virus) infection     Past Surgical History:  Procedure Laterality Date  . CHOLECYSTECTOMY    . ESOPHAGOGASTRODUOD[facility]OSCOPY  10[facility]2014   [facility]. Dr. Merri BrunetteFina  . GASTRIC BYPASS  05/2001  . SMALL INTESTINE SURGERY  2004   exploratory    Prior to Admission medications   Medication Sig Start Date End Date Taking? Authorizing Provider  acetaminophen (TYLENOL) 325 MG tablet Take 2 tablets (650 mg total) by mouth every 6 (six) hours as needed for mild pain (or Fever >/= 101). Patient not taking: Reported on 09/27/2016 06/03/16   Elease EtienneHongalgi, Anand D, MD  ALPRAZolam Prudy Feeler(XANAX) 0.5 MG tablet Take 1 tablet (0.5 mg total) by mouth 3 (three) times daily as needed for anxiety. 09/29/16   Auburn BilberryPatel, Shreyang, MD  B Complex-C (B-COMPLEX WITH VITAMIN C) tablet Take 1 tablet by mouth daily. Patient not taking:  Reported on 09/27/2016 06/04/16   Elease EtienneHongalgi, Anand D, MD  calcium-vitamin D (OSCAL WITH D) 500-200 MG-UNIT tablet Take 2 tablets by mouth daily with breakfast. Patient not taking: Reported on 09/27/2016 06/04/16   Elease EtienneHongalgi, Anand D, MD  docusate sodium (COLACE) 100 MG capsule Take 1 capsule (100 mg total) by mouth 2 (two) times daily as needed for mild constipation. 09/29/16   Auburn BilberryPatel, Shreyang, MD  furosemide (LASIX) 40 MG tablet Take 1 tablet (40 mg total) by mouth daily as needed. Patient taking differently: Take 40 mg by  mouth daily as needed for fluid.  03/31/16   Donita BrooksPickard, Warren T, MD  levothyroxine (SYNTHROID, LEVOTHROID) 125 MCG tablet TAKE 1 TABLET EVERY DAY Patient taking differently: Take 125 mcg by mouth every other day, alternating with 137 mcg tablet. 11/25/15   Donita BrooksPickard, Warren T, MD  levothyroxine (SYNTHROID, LEVOTHROID) 137 MCG tablet Take 137 mcg by mouth every other day. Alternating with 125 mcg tablet.    [provider]  LYRICA 225 MG capsule TAKE 1 CAPSULE BY MOUTH TWICE DAILY 10/03/16   Donita BrooksPickard, Warren T, MD  Magnesium Oxide 400 MG CAPS Take 1 capsule (400 mg total) by mouth 2 (two) times daily. 09/29/16   Auburn BilberryPatel, Shreyang, MD  metolazone (ZAROXOLYN) 5 MG tablet TAKE 1 TABLET BY MOUTH EVERY DAY 30 MINS BEFORE LASIX 1-2 TIMES PER WEEK. 09/05/16   Donita BrooksPickard, Warren T, MD  Multiple Vitamin (MULTIVITAMIN WITH MINERALS) TABS tablet Take 1 tablet by mouth 2 (two) times daily. Patient not taking: Reported on 09/27/2016 06/04/16   Elease EtienneHongalgi, Anand D, MD  naproxen sodium (ANAPROX) 220 MG tablet Take 220 mg by mouth 2 (two) times daily with a meal.    [provider]  nortriptyline (PAMELOR) 75 MG capsule TAKE ONE CAPSULE BY MOUTH EVERY EVENING AT BEDTIME 06/26/16   Donita BrooksPickard, Warren T, MD  omega-3 acid ethyl esters (LOVAZA) 1 g capsule Take 1 capsule (1 g total) by mouth daily. Patient not taking: Reported on 09/27/2016 06/04/16   Elease EtienneHongalgi, Anand D, MD  oxyCODONE-acetaminophen (PERCOCET/ROXICET) 5-325 MG tablet Take 1 tablet by mouth every 6 (six) hours as needed for severe pain. 09/29/16   Auburn BilberryPatel, Shreyang, MD  potassium chloride SA (K-DUR,KLOR-CON) 20 MEQ tablet Take 1 tablet (20 mEq total) by mouth 2 (two) times daily as needed (when taking lasix). Patient taking differently: Take 20 mEq by mouth daily.  06/03/16   Hongalgi, Maximino GreenlandAnand D, MD  protein supplement (UNJURY CHICKEN SOUP) POWD Take 27 g (8 oz total) by mouth 2 (two) times daily after a meal. Patient not taking: Reported on 09/27/2016 06/04/16    Elease EtienneHongalgi, Anand D, MD  RABEprazole (ACIPHEX) 20 MG tablet 1 tab po bid Patient taking differently: Take 20 mg by mouth daily.  06/29/16   Donita BrooksPickard, Warren T, MD  simethicone (MYLICON) 80 MG chewable tablet Chew 1 tablet (80 mg total) by mouth 4 (four) times daily as needed for flatulence. 09/29/16   Auburn BilberryPatel, Shreyang, MD  thiamine 100 MG tablet Take 1 tablet (100 mg total) by mouth daily. Patient not taking: Reported on 09/27/2016 06/04/16   Elease EtienneHongalgi, Anand D, MD  topiramate (TOPAMAX) 50 MG tablet TAKE 1 TABLET BY MOUTH AT BEDTIME Patient taking differently: TAKE 50mg  TABLET BY MOUTH AT BEDTIME 01/31/16   Donita BrooksPickard, Warren T, MD  traMADol (ULTRAM) 50 MG tablet Take 1 tablet (50 mg total) by mouth every 6 (six) hours as needed for moderate pain or severe pain. Patient not taking: Reported on 09/27/2016 06/03/16  Elease Etienne, MD    Allergies Ibuprofen; Toradol [ketorolac tromethamine]; Aspirin; and Cleocin [clindamycin hcl]  Family History  Problem Relation Age of Onset  . Depression Mother   . Diabetes Mother   . Hyperlipidemia Mother   . Depression Father   . Hearing loss Father   . Hyperlipidemia Father   . Hypertension Father   . Learning disabilities Father   . Mental illness Father   . Asthma Brother   . Alcohol abuse Maternal Grandmother   . Arthritis Maternal Grandmother   . Cancer Maternal Grandmother   . Cancer Maternal Grandfather   . Mental illness Paternal Grandmother   . Heart disease Paternal Grandfather   . Stroke Paternal Grandfather     Social History Social History  Substance Use Topics  . Smoking status: Current Every Day Smoker    Packs/day: 0.50    Types: Cigarettes  . Smokeless tobacco: Never Used  . Alcohol use 6.0 oz/week    10 Shots of liquor per week    Review of Systems  Constitutional: No fever/chills Eyes: No visual changes. ENT: No sore throat. Cardiovascular: Denies chest pain. Respiratory: Denies shortness of breath. Gastrointestinal:  No abdominal pain.  No nausea, no vomiting.  No diarrhea.  No constipation. Genitourinary: Negative for dysuria. Musculoskeletal: Negative for back pain. Skin: Negative for rash. Neurological: Negative for headaches, focal weakness or numbness.   ____________________________________________   PHYSICAL EXAM:  VITAL SIGNS: ED Triage Vitals  Enc Vitals Group     BP 10/14/16 1620 119/75     Pulse Rate 10/14/16 1620 (!) 105     Resp 10/14/16 1620 20     Temp 10/14/16 1620 98.5 F (36.9 C)     Temp Source 10/14/16 1620 Oral     SpO2 10/14/16 1620 100 %     Weight 10/14/16 1620 176 lb (79.8 kg)     Height 10/14/16 1620 5\' 1"  (1.549 m)     Head Circumference --      Peak Flow --      Pain Score 10/14/16 1619 9     Pain Loc --      Pain Edu? --      Excl. in GC? --     Constitutional: Alert and oriented. Well appearing and in no acute distress. Eyes: Conjunctivae are Pale Head: Atraumatic. Nose: No congestion/rhinnorhea. Mouth/Throat: Mucous membranes are moist.  Neck: No stridor.   Cardiovascular: Tachycardic, regular rhythm. Grossly normal heart sounds.  Good peripheral circulation with equal and intact bilateral dorsalis pedis pulses. Respiratory: Normal respiratory effort.  No retractions. Lungs CTAB. Gastrointestinal: Soft and nontender. No distention.  Musculoskeletal: Bilateral lower extremity edema that is moderate. The edema extends to the mid calves bilaterally where there is fibrotic skin consistent with chronic stasis. There is a foul odor to the bilateral feet with the left foot having Powell skin between the toes as well as unroofed area on the forefoot laterally which is about 4 x 6 cm which is erythematous without any surrounding pus or induration. The bilateral lower extremities are tender to palpation. Neurologic:  Normal speech and language. No gross focal neurologic deficits are appreciated. Skin:  Skin is warm, dry and intact. No rash noted. Psychiatric: Mood  and affect are normal. Speech and behavior are normal.  ____________________________________________   LABS (all labs ordered are listed, but only abnormal results are displayed)  Labs Reviewed  CBC WITH DIFFERENTIAL/PLATELET - Abnormal; Notable for the following:  Result Value   RBC 3.45 (*)    MCV 107.8 (*)    MCH 35.7 (*)    RDW 15.3 (*)    All other components within normal limits  COMPREHENSIVE METABOLIC PANEL - Abnormal; Notable for the following:    Chloride 112 (*)    Calcium 7.9 (*)    Total Protein 4.9 (*)    Albumin 1.7 (*)    Alkaline Phosphatase 170 (*)    Total Bilirubin 1.6 (*)    All other components within normal limits  LACTATE DEHYDROGENASE - Abnormal; Notable for the following:    LDH 363 (*)    All other components within normal limits  TSH - Abnormal; Notable for the following:    TSH 6.198 (*)    All other components within normal limits  CULTURE, BLOOD (ROUTINE X 2)  CULTURE, BLOOD (ROUTINE X 2)  URINE CULTURE  LACTIC ACID, PLASMA  PROTIME-INR  TROPONIN I  LACTIC ACID, PLASMA  URINALYSIS, COMPLETE (UACMP) WITH MICROSCOPIC   ____________________________________________  EKG  ED ECG REPORT I, Arelia Longest, the attending physician, personally viewed and interpreted this ECG.   Date: 10/14/2016  EKG Time: 1612  Rate: 101  Rhythm: sinus tachycardia  Axis: Normal  Intervals:Prolonged QT  ST&T Change: No ST segment elevation or depression. No abnormal T-wave inversion.  ____________________________________________  RADIOLOGY  No acute finding on the chest x-ray ____________________________________________   PROCEDURES  Procedure(s) performed:   Procedures  Critical Care performed:   ____________________________________________   INITIAL IMPRESSION / ASSESSMENT AND PLAN / ED COURSE  Pertinent labs & imaging results that were available during my care of the patient were reviewed by me and considered in my medical  decision making (see chart for details).  ----------------------------------------- 5:47 PM on 10/14/2016 -----------------------------------------  Patient persistently tachycardic. Very weak and unable to ambulate and needs assistance to get to the toilet. Labs largely reassuring with a normal white blood cell count and lactic acid. However, the patient is ill-appearing and because of her severe rheumatologic disease she may not have adequately functioning immune system to probably fight infection which appears to be cellulitic at this time. She'll be started on antibiotics for a cellulitis of the left lower extremity. She'll be admitted to the hospital. She is understanding of this plan and willing to comply. Signed out to Dr. Seth Bake.      ____________________________________________   FINAL CLINICAL IMPRESSION(S) / ED DIAGNOSES  Weakness. Left lower extremity cellulitis.    NEW MEDICATIONS STARTED DURING THIS VISIT:  New Prescriptions   No medications on file     Note:  This document was prepared using Dragon voice recognition software and may include unintentional dictation errors.     Myrna Blazer, MD 10/14/16 [phone]

## 2016-10-14 NOTE — Progress Notes (Signed)
Pharmacy Antibiotic Note  Dorothy Dyer is a 41 y.o. female admitted on 10/14/2016 with cellulitis no note of abscess.  Pharmacy has been consulted for Zosyn and vancomycin dosing.  Plan: 1. Zosyn 3.375 gm IV Q8H EI 2. Vancomycin 1 gm IV x 1 in ED followed in approximately 6 hours (stacked dosing) by vancomycin 750 mg IV Q12H, predicted trough 12 mcg/mL. Pharmacy will continue to follow and adjust as needed to maintain trough 10 to 15 mcg/mL.   Vd 42.2 L, ke 00.77 hr-1, T1/2 9 hr  Height: 5\' 1"  (154.9 cm) Weight: 176 lb (79.8 kg) IBW/kg (Calculated) : 47.8  Temp (24hrs), Avg:98.5 F (36.9 C), Min:98.5 F (36.9 C), Max:98.5 F (36.9 C)   Recent Labs Lab 10/14/16 1634  WBC 7.6  CREATININE 0.66  LATICACIDVEN 1.6    Estimated Creatinine Clearance: 87.6 mL/min (by C-G formula based on SCr of 0.66 mg/dL).    Allergies  Allergen Reactions  . Ibuprofen Nausea Only and Other (See Comments)    Bad stomach pains  . Toradol [Ketorolac Tromethamine] Anxiety  . Tylenol [Acetaminophen]   . Aspirin Hives  . Cleocin [Clindamycin Hcl] Itching and Rash    Thank you for allowing pharmacy to be a part of this patient's care.  Dorothy Dyer, Pharm.D., BCPS Clinical Pharmacist  10/14/2016 6:07 PM

## 2016-10-14 NOTE — ED Notes (Signed)
Pt taken to floor by ED Tech with all of patient belongings including, clothing and cell phone.

## 2016-10-14 NOTE — ED Notes (Signed)
Myself and Paramedic A. Littie DeedsGentry help this patient to the restroom. She had a very difficult time while on her feet. Due to this I advised this patient the next time she has to use the restroom she would have to use the bedpan, this is for her safety and ours. She understood and agreed.

## 2016-10-14 NOTE — H&P (Addendum)
[facility] - [place] at [facility]   PATIENT NAME: Dorothy Dyer    MR#:  [id number]  DATE OF BIRTH:  [birth year]  DATE OF ADMISSION:  10/14/2016  PRIMARY CARE PHYSICIAN: Patient, No Pcp Per   REQUESTING/REFERRING PHYSICIAN:   CHIEF COMPLAINT:   Chief Complaint  Patient presents with  . Weakness    HISTORY OF PRESENT ILLNESS: Dorothy Dyer  is a 41 y.o. female with a known history of Psoriatic arthritis, left hip pain, frequent falls, recent diagnosis of clavicular fracture, avascular necrosis of the hip, hypokalemia, hypomagnesemia, gastro-esophageal reflux disease, hypothyroidism, essential hypertension, ongoing tobacco abuse, who presents to the hospital with complaints of bilateral lower extremity redness and drainage. She feels fatigued and weak, has significant bilateral lower extremity pains. On arrival to the hospital, she was noted to have bilateral lower extremity cellulitis and hospitalist services were contacted for admission. She was noted to be tachycardic with heart rate around 105 in ER, However, white blood cell count was normal. She denied any fevers or chills. She was recently diagnosed with clavicular fracture on the right side, discharged to home with a sling, however, was not able to use sublingual, and now was noted to have separation of fracture ends, according to the patient.   PAST MEDICAL HISTORY:   Past Medical History:  Diagnosis Date  . Abdominal pain, chronic, generalized   . Acid reflux   . Anemia   . Anxiety   . Arthritis   . AVN of femur (HCC)    left hip  . Clotting disorder (HCC)    undetermined  . Cystitis   . 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 Hypertension   . Neuromuscular disorder (HCC)   . Osteoporosis   . Psoriasis   . Thyroid disease     PAST SURGICAL HISTORY: Past Surgical History:  Procedure Laterality Date  . CHOLECYSTECTOMY    . ESOPHAGOGASTRODUODENOSCOPY  10/2014   at [facility],  was normal. Dr. Merri Brunette  . GASTRIC BYPASS  05/2001  . SMALL INTESTINE SURGERY  2004   exploratory    SOCIAL HISTORY:  Social History  Substance Use Topics  . Smoking status: Current Every Day Smoker    Packs/day: 0.50    Types: Cigarettes  . Smokeless tobacco: Never Used  . Alcohol use 6.0 oz/week    10 Shots of liquor per week    FAMILY HISTORY:  Family History  Problem Relation Age of Onset  . Depression Mother   . Diabetes Mother   . Hyperlipidemia Mother   . Depression Father   . Hearing loss Father   . Hyperlipidemia Father   . Hypertension Father   . Learning disabilities Father   . Mental illness Father   . Asthma Brother   . Alcohol abuse Maternal Grandmother   . Arthritis Maternal Grandmother   . Cancer Maternal Grandmother   . Cancer Maternal Grandfather   . Mental illness Paternal Grandmother   . Heart disease Paternal Grandfather   . Stroke Paternal Grandfather     DRUG ALLERGIES:  Allergies  Allergen Reactions  . Ibuprofen Nausea Only and Other (See Comments)    Bad stomach pains  . Toradol [Ketorolac Tromethamine] Anxiety  . Tylenol [Acetaminophen]   . Aspirin Hives  . Cleocin [Clindamycin Hcl] Itching and Rash    Review of Systems  Constitutional: Positive for malaise/fatigue. Negative for chills, fever and weight loss.  HENT: Negative for congestion.   Eyes: Negative for blurred vision  and double vision.  Respiratory: Positive for shortness of breath. Negative for cough, sputum production and wheezing.   Cardiovascular: Negative for chest pain, palpitations, orthopnea, leg swelling and PND.  Gastrointestinal: Positive for constipation. Negative for abdominal pain, blood in stool, diarrhea, nausea and vomiting.  Genitourinary: Negative for dysuria, frequency, hematuria and urgency.  Musculoskeletal: Positive for joint pain. Negative for falls.  Neurological: Negative for dizziness, tremors, focal weakness and headaches.  Endo/Heme/Allergies: Does  not bruise/bleed easily.  Psychiatric/Behavioral: Negative for depression. The patient does not have insomnia.     MEDICATIONS AT HOME:  Prior to Admission medications   Medication Sig Start Date End Date Taking? Authorizing Provider  acetaminophen (TYLENOL) 325 MG tablet Take 2 tablets (650 mg total) by mouth every 6 (six) hours as needed for mild pain (or Fever >/= 101). 06/03/16  Yes Hongalgi, Maximino GreenlandAnand D, MD  ALPRAZolam (XANAX) 0.5 MG tablet Take 1 tablet (0.5 mg total) by mouth 3 (three) times daily as needed for anxiety. 09/29/16  Yes Auburn BilberryPatel, Shreyang, MD  docusate sodium (COLACE) 100 MG capsule Take 1 capsule (100 mg total) by mouth 2 (two) times daily as needed for mild constipation. 09/29/16  Yes Auburn BilberryPatel, Shreyang, MD  furosemide (LASIX) 40 MG tablet Take 1 tablet (40 mg total) by mouth daily as needed. Patient taking differently: Take 40 mg by mouth daily as needed for fluid.  03/31/16  Yes Donita BrooksPickard, Warren T, MD  levothyroxine (SYNTHROID, LEVOTHROID) 125 MCG tablet TAKE 1 TABLET EVERY DAY Patient taking differently: Take 125 mcg by mouth every other day, alternating with 137 mcg tablet. 11/25/15  Yes Donita BrooksPickard, Warren T, MD  levothyroxine (SYNTHROID, LEVOTHROID) 137 MCG tablet Take 137 mcg by mouth every other day. Alternating with 125 mcg tablet.   Yes [provider]  LYRICA 225 MG capsule TAKE 1 CAPSULE BY MOUTH TWICE DAILY 10/03/16  Yes Donita BrooksPickard, Warren T, MD  Magnesium Oxide 400 MG CAPS Take 1 capsule (400 mg total) by mouth 2 (two) times daily. 09/29/16  Yes Auburn BilberryPatel, Shreyang, MD  metolazone (ZAROXOLYN) 5 MG tablet TAKE 1 TABLET BY MOUTH EVERY DAY 30 MINS BEFORE LASIX 1-2 TIMES PER WEEK. 09/05/16  Yes Donita BrooksPickard, Warren T, MD  Multiple Vitamin (MULTIVITAMIN WITH MINERALS) TABS tablet Take 1 tablet by mouth 2 (two) times daily. 06/04/16  Yes Hongalgi, Maximino GreenlandAnand D, MD  nortriptyline (PAMELOR) 75 MG capsule TAKE ONE CAPSULE BY MOUTH EVERY EVENING AT BEDTIME 06/26/16  Yes Donita BrooksPickard, Warren T, MD  omega-3  acid ethyl esters (LOVAZA) 1 g capsule Take 1 capsule (1 g total) by mouth daily. 06/04/16  Yes Hongalgi, Maximino GreenlandAnand D, MD  oxyCODONE-acetaminophen (PERCOCET/ROXICET) 5-325 MG tablet Take 1 tablet by mouth every 6 (six) hours as needed for severe pain. 09/29/16  Yes Auburn BilberryPatel, Shreyang, MD  potassium chloride SA (K-DUR,KLOR-CON) 20 MEQ tablet Take 1 tablet (20 mEq total) by mouth 2 (two) times daily as needed (when taking lasix). Patient taking differently: Take 20 mEq by mouth daily.  06/03/16  Yes Hongalgi, Maximino GreenlandAnand D, MD  RABEprazole (ACIPHEX) 20 MG tablet 1 tab po bid Patient taking differently: Take 20 mg by mouth daily.  06/29/16  Yes Donita BrooksPickard, Warren T, MD  simethicone (MYLICON) 80 MG chewable tablet Chew 1 tablet (80 mg total) by mouth 4 (four) times daily as needed for flatulence. 09/29/16  Yes Auburn BilberryPatel, Shreyang, MD  thiamine 100 MG tablet Take 1 tablet (100 mg total) by mouth daily. 06/04/16  Yes Hongalgi, Maximino GreenlandAnand D, MD  topiramate (TOPAMAX) 50 MG tablet  TAKE 1 TABLET BY MOUTH AT BEDTIME Patient taking differently: TAKE 50mg  TABLET BY MOUTH AT BEDTIME 01/31/16  Yes Donita BrooksPickard, Warren T, MD  traMADol (ULTRAM) 50 MG tablet Take 1 tablet (50 mg total) by mouth every 6 (six) hours as needed for moderate pain or severe pain. 06/03/16  Yes Hongalgi, Maximino GreenlandAnand D, MD  B Complex-C (B-COMPLEX WITH VITAMIN C) tablet Take 1 tablet by mouth daily. Patient not taking: Reported on 09/27/2016 06/04/16   Elease EtienneHongalgi, Anand D, MD  calcium-vitamin D (OSCAL WITH D) 500-200 MG-UNIT tablet Take 2 tablets by mouth daily with breakfast. Patient not taking: Reported on 09/27/2016 06/04/16   Elease EtienneHongalgi, Anand D, MD  naproxen sodium (ANAPROX) 220 MG tablet Take 220 mg by mouth 2 (two) times daily with a meal.    [provider]  protein supplement (UNJURY CHICKEN SOUP) POWD Take 27 g (8 oz total) by mouth 2 (two) times daily after a meal. Patient not taking: Reported on 09/27/2016 06/04/16   Elease EtienneHongalgi, Anand D, MD      PHYSICAL EXAMINATION:    VITAL SIGNS: Blood pressure 107/81, pulse (!) 101, temperature 98.5 F (36.9 C), temperature source Oral, resp. rate 14, height 5\' 1"  (1.549 m), weight 79.8 kg (176 lb), SpO2 100 %.  GENERAL:  41 y.o.-year-old  Pale patient lying in the bed  in mild to moderate distress due to bilateral lower extremity and feet pain.  EYES: Pupils equal, round, reactive to light and accommodation. No scleral icterus. Extraocular muscles intact.  HEENT: Head atraumatic, normocephalic. Oropharynx and nasopharynx clear.  NECK:  Supple, no jugular venous distention. No thyroid enlargement, no tenderness.  LUNGS:  Some diminishedbreath sounds bilaterally,  Scattered wheezing on the right anteriorly,  norales,rhonchi or crepitation. No use of accessory muscles of respiration.  CARDIOVASCULAR: S1, S2 , tachycardic, rhythm was regular.  No murmurs, rubs, or gallops.  ABDOMEN: Soft, nontender, nondistended. Bowel sounds present. No organomegaly or mass.  EXTREMITIES:  2-3+ lower extremity andpedal edema,  No cyanosis, or clubbing.  Bilateral lower extremities as well as right upper extremity reveals significant skin scaling, erosions in the bilateral lower extremity shins, some bullous changes of the skin in lower extremities NEUROLOGIC: Cranial nerves II through XII are intact. Muscle strength 5/5 in all extremities. Sensation intact. Gait not checked.  PSYCHIATRIC: The patient is alert and oriented x 3.  SKIN:  Erythematous rash, innumerable  lesions ,  Ulcerations of the skin of bilateral lower extremities below the knees with some serous drainage, significant tenderness to palpation of the skin of the feet, calves  LABORATORY PANEL:   CBC  Recent Labs Lab 10/14/16 1634  WBC 7.6  HGB 12.3  HCT 37.2  PLT 173  MCV 107.8*  MCH 35.7*  MCHC 33.1  RDW 15.3*  LYMPHSABS 2.3  MONOABS 0.8  EOSABS 0.1  BASOSABS 0.1    ------------------------------------------------------------------------------------------------------------------  Chemistries   Recent Labs Lab 10/14/16 1634  NA 144  K 3.5  CL 112*  CO2 24  GLUCOSE 82  BUN 9  CREATININE 0.66  CALCIUM 7.9*  AST 39  ALT 42  ALKPHOS 170*  BILITOT 1.6*   ------------------------------------------------------------------------------------------------------------------  Cardiac Enzymes  Recent Labs Lab 10/14/16 1634  TROPONINI <0.03   ------------------------------------------------------------------------------------------------------------------  RADIOLOGY: Dg Chest 1 View  Result Date: 10/14/2016 CLINICAL DATA:  Recent discharge from hospital, now unable to walk or stand independently. pt is having weakness and bilat lower extremity pain and swelling. Pt also has skin sloughing with bilat ulcerated areas to  her lower legs and feet. EXAM: CHEST 1 VIEW COMPARISON:  Chest x-ray dated 09/27/2016. FINDINGS: Study is hypoinspiratory with crowding of the perihilar bronchovascular markings. Given the low lung volumes, lungs appear clear. No pleural effusion or pneumothorax seen. Heart size and mediastinal contours are normal. No acute or suspicious osseous finding. IMPRESSION: Low lung volumes. No active disease. No evidence of pneumonia or pulmonary edema. Electronically Signed   By: Bary Richard M.D.   On: 10/14/2016 17:32    EKG: Orders placed or performed during the hospital encounter of 10/14/16  . ED EKG 12-Lead  . ED EKG 12-Lead  EKG in the emergency room reveals sinus tachycardia at rate of 101 bpm, low voltage QRS, nonspecific is DVT changes in lateral leads, prolonged QT cc interval to 545 ms  IMPRESSION AND PLAN:  Active Problems:   Bilateral lower leg cellulitis   Leg swelling  #1. Bilateral lower extremity cellulitis, initiate patient on broad-spectrum antibody therapy after blood cultures are taken, follow culture results,  get surgery involved for further recommendations. Until wound care is available on Monday #2. Lower extremity swelling, rule out deep vein thrombosis, get Doppler ultrasound #3. Malnutrition with low albumin level suspected due to skin disease/drainage, get dietary involved for further recommendations #4. Hypothyroidism, advanced Synthroid to 137 g daily #5. Tobacco abuse. Counseling, discussed this patient for 4 minutes, nicotine replacement therapy will be initiated, patient was agreeable #6. COPD exacerbation, initiate patient on nebulizing therapy   All the records are reviewed and case discussed with ED provider. Management plans discussed with the patient, family and they are in agreement.  CODE STATUS: Code Status History    Date Active Date Inactive Code Status Order ID Comments User Context   09/27/2016  6:11 PM 09/29/2016  9:00 PM Full Code [id number]  Altamese Dilling, MD Inpatient   06/01/2016  6:34 PM 06/03/2016 10:42 PM Full Code [id number]  Russella Dar, NP Inpatient   02/11/2016  2:22 AM 02/11/2016 10:15 PM Full Code [id number]  Eduard Clos, MD Inpatient       TOTAL TIME TAKING CARE OF THIS PATIENT: 50 minutes.    Katharina Caper M.D on 10/14/2016 at 7:06 PM  Between 7am to 6pm - Pager - [phone] After 6pm go to [email or web address] - password [email or web address]  [facility]  Office  [phone]  CC: Primary care physician; Patient, No Pcp Per

## 2016-10-14 NOTE — ED Notes (Signed)
Report to Jackie, RN

## 2016-10-14 NOTE — ED Notes (Signed)
Pt assisted up to bedside commode by 2 person. Pt would not use bedpan for BM. Pt bears minimal weight on feet and is unsafe for independent or 1 person assist. High fall risk band and bed alarm on patient. This RN at bedside while pt attempts to have BM on bedside commode.
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
## 2016-10-14 NOTE — ED Notes (Signed)
Pt back in bed at this time.

## 2016-10-15 ENCOUNTER — Inpatient Hospital Stay: Payer: Medicare HMO

## 2016-10-15 DIAGNOSIS — L03115 Cellulitis of right lower limb: Secondary | ICD-10-CM

## 2016-10-15 DIAGNOSIS — L8990 Pressure ulcer of unspecified site, unspecified stage: Secondary | ICD-10-CM | POA: Insufficient documentation

## 2016-10-15 DIAGNOSIS — L03116 Cellulitis of left lower limb: Principal | ICD-10-CM

## 2016-10-15 DIAGNOSIS — M7989 Other specified soft tissue disorders: Secondary | ICD-10-CM

## 2016-10-15 LAB — URINALYSIS, COMPLETE (UACMP) WITH MICROSCOPIC
Bacteria, UA: NONE SEEN
Glucose, UA: NEGATIVE mg/dL
Ketones, ur: NEGATIVE mg/dL
NITRITE: NEGATIVE
PH: 5 (ref 5.0–8.0)
Protein, ur: 100 mg/dL — AB
SPECIFIC GRAVITY, URINE: 1.038 — AB (ref 1.005–1.030)

## 2016-10-15 LAB — BASIC METABOLIC PANEL
Anion gap: 5 (ref 5–15)
BUN: 11 mg/dL (ref 6–20)
CHLORIDE: 113 mmol/L — AB (ref 101–111)
CO2: 26 mmol/L (ref 22–32)
CREATININE: 0.76 mg/dL (ref 0.44–1.00)
Calcium: 7.4 mg/dL — ABNORMAL LOW (ref 8.9–10.3)
GFR calc Af Amer: >60 mL/min (ref >60)
GFR calc non Af Amer: >60 mL/min (ref >60)
GLUCOSE: 94 mg/dL (ref 65–99)
Potassium: 2.6 mmol/L — CL (ref 3.5–5.1)
Sodium: 144 mmol/L (ref 135–145)

## 2016-10-15 LAB — MRSA PCR SCREENING: MRSA by PCR: POSITIVE — AB

## 2016-10-15 LAB — CBC
HEMATOCRIT: 26.2 % — AB (ref 35.0–47.0)
Hemoglobin: 9 g/dL — ABNORMAL LOW (ref 12.0–16.0)
MCH: 36.4 pg — AB (ref 26.0–34.0)
MCHC: 34.3 g/dL (ref 32.0–36.0)
MCV: 105.9 fL — AB (ref 80.0–100.0)
PLATELETS: 126 10*3/uL — AB (ref 150–440)
RBC: 2.48 MIL/uL — ABNORMAL LOW (ref 3.80–5.20)
RDW: 15.4 % — AB (ref 11.5–14.5)
WBC: 5.6 10*3/uL (ref 3.6–11.0)

## 2016-10-15 LAB — GLUCOSE, CAPILLARY
GLUCOSE-CAPILLARY: 83 mg/dL (ref 65–99)
Glucose-Capillary: 87 mg/dL (ref 65–99)

## 2016-10-15 LAB — POTASSIUM: Potassium: 3.3 mmol/L — ABNORMAL LOW (ref 3.5–5.1)

## 2016-10-15 MED ORDER — VITAMIN B-12 1000 MCG PO TABS
1000.0000 ug | ORAL_TABLET | Freq: Every day | ORAL | Status: DC
  Administered 2016-10-16: 1000 ug via ORAL
  Filled 2016-10-15: qty 1

## 2016-10-15 MED ORDER — MUPIROCIN 2 % EX OINT
1.0000 "application " | TOPICAL_OINTMENT | Freq: Two times a day (BID) | CUTANEOUS | Status: DC
  Administered 2016-10-15 – 2016-10-16 (×3): 1 via NASAL
  Filled 2016-10-15: qty 22

## 2016-10-15 MED ORDER — IPRATROPIUM-ALBUTEROL 0.5-2.5 (3) MG/3ML IN SOLN: 3.0000 mL | Freq: Four times a day (QID) | RESPIRATORY_TRACT | Status: DC | PRN

## 2016-10-15 MED ORDER — PREMIER PROTEIN SHAKE
11.0000 [oz_av] | Freq: Three times a day (TID) | ORAL | Status: DC
  Administered 2016-10-15 – 2016-10-16 (×2): 11 [oz_av] via ORAL

## 2016-10-15 MED ORDER — POTASSIUM CHLORIDE CRYS ER 20 MEQ PO TBCR
40.0000 meq | EXTENDED_RELEASE_TABLET | Freq: Once | ORAL | Status: AC
  Administered 2016-10-15: 40 meq via ORAL
  Filled 2016-10-15: qty 2

## 2016-10-15 MED ORDER — CHLORHEXIDINE GLUCONATE CLOTH 2 % EX PADS
6.0000 | MEDICATED_PAD | Freq: Every day | CUTANEOUS | Status: DC
  Administered 2016-10-15: 6 via TOPICAL

## 2016-10-15 NOTE — Progress Notes (Signed)
BS clear.

## 2016-10-15 NOTE — Progress Notes (Signed)
[facility] - [place] at [facility]   PATIENT NAME: Dorothy Dyer    MR#:  [id number]  DATE OF BIRTH:  12/28/1975  SUBJECTIVE:  CHIEF COMPLAINT:   Chief Complaint  Patient presents with  . Weakness     History of gastric bypass surgery many years ago, [place] and since then has no primary care physician to follow up with. Does not take her nutrient supplements and is not very compliant with her diet either. Lives in a mobile home with her son. Had generalized weakness and severe edema and came with swelling on her legs and also found to have UTI. Have multiple electrolyte imbalances.  REVIEW OF SYSTEMS:  CONSTITUTIONAL: No fever, positive for fatigue or weakness.  EYES: No blurred or double vision.  EARS, NOSE, AND THROAT: No tinnitus or ear pain.  RESPIRATORY: No cough, shortness of breath, wheezing or hemoptysis.  CARDIOVASCULAR: No chest pain, orthopnea, edema.  GASTROINTESTINAL: No nausea, vomiting, diarrhea or abdominal pain.  GENITOURINARY: No dysuria, hematuria.  ENDOCRINE: No polyuria, nocturia,  HEMATOLOGY: No anemia, easy bruising or bleeding SKIN: No rash or lesion. MUSCULOSKELETAL: No joint pain or arthritis.   NEUROLOGIC: No tingling, numbness, weakness.  PSYCHIATRY: No anxiety or depression.   ROS  DRUG ALLERGIES:   Allergies  Allergen Reactions  . Ibuprofen Nausea Only and Other (See Comments)    Bad stomach pains  . Toradol [Ketorolac Tromethamine] Anxiety  . Tylenol [Acetaminophen]   . Aspirin Hives  . Cleocin [Clindamycin Hcl] Itching and Rash    VITALS:  Blood pressure 96/63, pulse 91, temperature (!) 97.4 F (36.3 C), temperature source Oral, resp. rate 16, height 5\' 1"  (1.549 m), weight 82.1 kg (181 lb), SpO2 100 %.  PHYSICAL EXAMINATION:   GENERAL:  41 y.o.-year-old  Pale patient lying in the bed  in mild to moderate distress due to bilateral lower extremity and feet pain.  EYES: Pupils equal,  round, reactive to light and accommodation. No scleral icterus. Extraocular muscles intact.  HEENT: Head atraumatic, normocephalic. Oropharynx and nasopharynx clear.  NECK:  Supple, no jugular venous distention. No thyroid enlargement, no tenderness.  LUNGS:  Some diminishedbreath sounds bilaterally,  Scattered wheezing on the right anteriorly,  norales,rhonchi or crepitation. No use of accessory muscles of respiration.  CARDIOVASCULAR: S1, S2 , tachycardic, rhythm was regular.  No murmurs, rubs, or gallops.  ABDOMEN: Soft, nontender, nondistended. Bowel sounds present. No organomegaly or mass.  EXTREMITIES:  2-3+ lower extremity andpedal edema,  No cyanosis, or clubbing.  Bilateral lower extremities as well as right upper extremity reveals significant skin scaling, erosions in the bilateral lower extremity shins, some bullous changes of the skin in lower extremities NEUROLOGIC: Cranial nerves II through XII are intact. Muscle strength 5/5 in all extremities. Sensation intact. Gait not checked.  PSYCHIATRIC: The patient is alert and oriented x 3.  SKIN:  Erythematous rash, innumerable  lesions ,  Ulcerations of the skin of bilateral lower extremities below the knees with some serous drainage, significant tenderness to palpation of the skin of the feet, calves   Physical Exam LABORATORY PANEL:   CBC  Recent Labs Lab 10/15/16 0328  WBC 5.6  HGB 9.0*  HCT 26.2*  PLT 126*   ------------------------------------------------------------------------------------------------------------------  Chemistries   Recent Labs Lab 10/14/16 1634 10/15/16 0328  NA 144 144  K 3.5 2.6*  CL 112* 113*  CO2 24 26  GLUCOSE 82 94  BUN 9 11  CREATININE 0.66 0.76  CALCIUM 7.9* 7.4*  MG 1.8  --   AST 39  --   ALT 42  --   ALKPHOS 170*  --   BILITOT 1.6*  --    ------------------------------------------------------------------------------------------------------------------  Cardiac  Enzymes  Recent Labs Lab 10/14/16 1634  TROPONINI <0.03   ------------------------------------------------------------------------------------------------------------------  RADIOLOGY:  Dg Chest 1 View  Result Date: 10/14/2016 CLINICAL DATA:  Recent discharge from hospital, now unable to walk or stand independently. pt is having weakness and bilat lower extremity pain and swelling. Pt also has skin sloughing with bilat ulcerated areas to her lower legs and feet. EXAM: CHEST 1 VIEW COMPARISON:  Chest x-ray dated 09/27/2016. FINDINGS: Study is hypoinspiratory with crowding of the perihilar bronchovascular markings. Given the low lung volumes, lungs appear clear. No pleural effusion or pneumothorax seen. Heart size and mediastinal contours are normal. No acute or suspicious osseous finding. IMPRESSION: Low lung volumes. No active disease. No evidence of pneumonia or pulmonary edema. Electronically Signed   By: Bary RichardStan  Maynard M.D.   On: 10/14/2016 17:32   [place] Venous Img Lower Bilateral  Result Date: 10/15/2016 CLINICAL DATA:  Leg swelling. EXAM: BILATERAL LOWER EXTREMITY VENOUS DOPPLER ULTRASOUND TECHNIQUE: Gray-scale sonography with graded compression, as well as color Doppler and duplex ultrasound were performed to evaluate the lower extremity deep venous systems from the level of the common femoral vein and including the common femoral, femoral, profunda femoral, popliteal and calf veins including the posterior tibial, peroneal and gastrocnemius veins when visible. The superficial great saphenous vein was also interrogated. Spectral Doppler was utilized to evaluate flow at rest and with distal augmentation maneuvers in the common femoral, femoral and popliteal veins. COMPARISON:  Knee MRI 06/01/2016 FINDINGS: RIGHT LOWER EXTREMITY Common Femoral Vein: No evidence of thrombus. Normal compressibility, respiratory phasicity and response to augmentation. Saphenofemoral Junction: No evidence of thrombus.  Normal compressibility and flow on color Doppler imaging. Profunda Femoral Vein: No evidence of thrombus. Normal compressibility and flow on color Doppler imaging. Femoral Vein: No evidence of thrombus. Normal compressibility, respiratory phasicity and response to augmentation. Popliteal Vein: No evidence of thrombus. Normal compressibility, respiratory phasicity and response to augmentation. Calf Veins: No evidence of thrombus. Normal compressibility and flow on color Doppler imaging. Superficial Great Saphenous Vein: No evidence of thrombus. Normal compressibility and flow on color Doppler imaging. Other Findings:  Subcutaneous edema. LEFT LOWER EXTREMITY Common Femoral Vein: No evidence of thrombus. Normal compressibility, respiratory phasicity and response to augmentation. Saphenofemoral Junction: No evidence of thrombus. Normal compressibility and flow on color Doppler imaging. Profunda Femoral Vein: No evidence of thrombus. Normal compressibility and flow on color Doppler imaging. Femoral Vein: No evidence of thrombus. Normal compressibility, respiratory phasicity and response to augmentation. Popliteal Vein: No evidence of thrombus. Normal compressibility, respiratory phasicity and response to augmentation. Calf Veins: No evidence of thrombus. Normal compressibility and flow on color Doppler imaging. Superficial Great Saphenous Vein: No evidence of thrombus. Normal compressibility and flow on color Doppler imaging. Other Findings:  Subcutaneous edema. IMPRESSION: No evidence of DVT within either lower extremity. Bilateral subcutaneous edema. Electronically Signed   By: Genevive BiStewart  Edmunds M.D.   On: 10/15/2016 10:55    ASSESSMENT AND PLAN:   Active Problems:   Bilateral lower leg cellulitis   Leg swelling   Pressure injury of skin   #1. Bilateral lower extremity cellulitis, and UTI  initiate patient on broad-spectrum antibody therapy after blood cultures are taken, follow culture results, get  vascular surgery involved for further recommendations. Until wound care  is available on Monday #2. Lower extremity swelling, ruled out deep vein thrombosis, negative Doppler ultrasound #3. Malnutrition with low albumin level  Due to hx of gastric bypass sx and not taking nutrients and   following diet guidelines.   get dietary involved for further recommendations #4. Hypothyroidism, advanced Synthroid to 137 g daily- she was on alternate 125- 137 mcg, but have high TSH, so give daily 137 mcg. #5. Tobacco abuse. Counseling, discussed this patient for 4 minutes, nicotine replacement therapy will be initiated, patient was agreeable #6. COPD exacerbation, initiate patient on nebulizing therapy #7 hypokalemia   Replace. #8. Macrocytic anemia   Hb dropped- but feels like now at her baseline- was high on admission due to dehydration likely.   Give vit B12.   All the records are reviewed and case discussed with Care Management/Social Workerr. Management plans discussed with the patient, family and they are in agreement.  CODE STATUS: full.  TOTAL TIME TAKING CARE OF THIS PATIENT: 35 minutes.     POSSIBLE D/C IN 1-2 DAYS, DEPENDING ON CLINICAL CONDITION.   Altamese Dilling M.D on 10/15/2016   Between 7am to 6pm - Pager - [phone]  After 6pm go to [email or web address] - password [email or web address]  [facility]  Office  [phone]  CC: Primary care physician; Patient, No Pcp Per  Note: This dictation was prepared with Dragon dictation along with smaller phrase technology. Any transcriptional errors that result from this process are unintentional.

## 2016-10-15 NOTE — Consult Note (Signed)
Reason for Consult:Bilateral lower extremity Cellulitis  Referring Physician: Dr. Webb Dorothy Dyer is an 41 y.o. female.  HPI: Patient with history of gastric bypass many years ago. No medical evaluation in the last few years. Poor nutrition. Hypothyroidism and poor self care. Presents with bilateral lower extremity cellulitis and edema. The patient is a poor historian. However, from what I could gather she has had lower extremity edema for years. She states she has used lasix in the past for this. She is unclear on when the redness and pain began. Denies claudication but does very little activity. Denies rest pain. Denies TIA symptoms.  Past Medical History:  Diagnosis Date  . Abdominal pain, chronic, generalized   . Acid reflux   . Anemia   . Anxiety   . Arthritis   . 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 Clotting disorder ([place])    undetermined  . Cystitis   . 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 Hypertension   . Neuromuscular disorder ([place])   . Osteoporosis   . Psoriasis   . Thyroid disease     Past Surgical History:  Procedure Laterality Date  . CHOLECYSTECTOMY    . ESOPHAGOGASTRODUODENOSCOPY  10/2014   at [facility], was normal. Dr. Roney Mans  . GASTRIC BYPASS  05/2001  . SMALL INTESTINE SURGERY  2004   exploratory    Family History  Problem Relation Age of Onset  . Depression Mother   . Diabetes Mother   . Hyperlipidemia Mother   . Depression Father   . Hearing loss Father   . Hyperlipidemia Father   . Hypertension Father   . Learning disabilities Father   . Mental illness Father   . Asthma Brother   . Alcohol abuse Maternal Grandmother   . Arthritis Maternal Grandmother   . Cancer Maternal Grandmother   . Cancer Maternal Grandfather   . Mental illness Paternal Grandmother   . Heart disease Paternal Grandfather   . Stroke Paternal Grandfather     Social History:  reports that she has been smoking Cigarettes.  She has been smoking about 0.50 packs  per day. She has never used smokeless tobacco. She reports that she drinks about 6.0 oz of alcohol per week . She reports that she does not use drugs.  Allergies:  Allergies  Allergen Reactions  . Ibuprofen Nausea Only and Other (See Comments)    Bad stomach pains  . Toradol [Ketorolac Tromethamine] Anxiety  . Tylenol [Acetaminophen]   . Aspirin Hives  . Cleocin [Clindamycin Hcl] Itching and Rash    Medications: I have reviewed the patient's current medications.  Results for orders placed or performed during the hospital encounter of 10/14/16 (from the past 48 hour(s))  Lactic acid, plasma     Status: None   Collection Time: 10/14/16  4:34 PM  Result Value Ref Range   Lactic Acid, Venous 1.6 0.5 - 1.9 mmol/L  Protime-INR     Status: None   Collection Time: 10/14/16  4:34 PM  Result Value Ref Range   Prothrombin Time 13.1 11.4 - 15.2 seconds   INR 0.99   CBC with Differential     Status: Abnormal   Collection Time: 10/14/16  4:34 PM  Result Value Ref Range   WBC 7.6 3.6 - 11.0 K/uL   RBC 3.45 (L) 3.80 - 5.20 MIL/uL   Hemoglobin 12.3 12.0 - 16.0 g/dL   HCT 37.2 35.0 - 47.0 %   MCV 107.8 (H)  80.0 - 100.0 fL   MCH 35.7 (H) 26.0 - 34.0 pg   MCHC 33.1 32.0 - 36.0 g/dL   RDW 15.3 (H) 11.5 - 14.5 %   Platelets 173 150 - 440 K/uL   Neutrophils Relative % 56 %   Neutro Abs 4.3 1.4 - 6.5 K/uL   Lymphocytes Relative 31 %   Lymphs Abs 2.3 1.0 - 3.6 K/uL   Monocytes Relative 10 %   Monocytes Absolute 0.8 0.2 - 0.9 K/uL   Eosinophils Relative 2 %   Eosinophils Absolute 0.1 0 - 0.7 K/uL   Basophils Relative 1 %   Basophils Absolute 0.1 0 - 0.1 K/uL  Comprehensive metabolic panel     Status: Abnormal   Collection Time: 10/14/16  4:34 PM  Result Value Ref Range   Sodium 144 135 - 145 mmol/L   Potassium 3.5 3.5 - 5.1 mmol/L   Chloride 112 (H) 101 - 111 mmol/L   CO2 24 22 - 32 mmol/L   Glucose, Bld 82 65 - 99 mg/dL   BUN 9 6 - 20 mg/dL   Creatinine, Ser 0.66 0.44 - 1.00 mg/dL    Calcium 7.9 (L) 8.9 - 10.3 mg/dL   Total Protein 4.9 (L) 6.5 - 8.1 g/dL   Albumin 1.7 (L) 3.5 - 5.0 g/dL   AST 39 15 - 41 U/L   ALT 42 14 - 54 U/L   Alkaline Phosphatase 170 (H) 38 - 126 U/L   Total Bilirubin 1.6 (H) 0.3 - 1.2 mg/dL   GFR calc non Af Amer >60 >60 mL/min   GFR calc Af Amer >60 >60 mL/min    Comment: (NOTE) The eGFR has been calculated using the CKD EPI equation. This calculation has not been validated in all clinical situations. eGFR's persistently <60 mL/min signify possible Chronic Kidney Disease.    Anion gap 8 5 - 15  Blood Culture (routine x 2)     Status: None (Preliminary result)   Collection Time: 10/14/16  4:34 PM  Result Value Ref Range   Specimen Description BLOOD BLOOD LEFT HAND    Special Requests      BOTTLES DRAWN AEROBIC AND ANAEROBIC Blood Culture results may not be optimal due to an inadequate volume of blood received in culture bottles   Culture NO GROWTH < 24 HOURS    Report Status PENDING   Troponin I     Status: None   Collection Time: 10/14/16  4:34 PM  Result Value Ref Range   Troponin I <0.03 <0.03 ng/mL  Lactate dehydrogenase     Status: Abnormal   Collection Time: 10/14/16  4:34 PM  Result Value Ref Range   LDH 363 (H) 98 - 192 U/L  TSH     Status: Abnormal   Collection Time: 10/14/16  4:34 PM  Result Value Ref Range   TSH 6.198 (H) 0.350 - 4.500 uIU/mL    Comment: Performed by a 3rd Generation assay with a functional sensitivity of <=0.01 uIU/mL.  Magnesium     Status: None   Collection Time: 10/14/16  4:34 PM  Result Value Ref Range   Magnesium 1.8 1.7 - 2.4 mg/dL  Blood Culture (routine x 2)     Status: None (Preliminary result)   Collection Time: 10/14/16  5:47 PM  Result Value Ref Range   Specimen Description BLOOD BLOOD RIGHT FOREARM    Special Requests      BOTTLES DRAWN AEROBIC AND ANAEROBIC Blood Culture adequate volume   Culture NO  GROWTH < 24 HOURS    Report Status PENDING   Basic metabolic panel     Status:  Abnormal   Collection Time: 10/15/16  3:28 AM  Result Value Ref Range   Sodium 144 135 - 145 mmol/L   Potassium 2.6 (LL) 3.5 - 5.1 mmol/L    Comment: CRITICAL RESULT CALLED TO, READ BACK BY AND VERIFIED WITH JACQUE DOUGLAS AT 0409 ON 10/15/16 RWW    Chloride 113 (H) 101 - 111 mmol/L   CO2 26 22 - 32 mmol/L   Glucose, Bld 94 65 - 99 mg/dL   BUN 11 6 - 20 mg/dL   Creatinine, Ser 0.76 0.44 - 1.00 mg/dL   Calcium 7.4 (L) 8.9 - 10.3 mg/dL   GFR calc non Af Amer >60 >60 mL/min   GFR calc Af Amer >60 >60 mL/min    Comment: (NOTE) The eGFR has been calculated using the CKD EPI equation. This calculation has not been validated in all clinical situations. eGFR's persistently <60 mL/min signify possible Chronic Kidney Disease.    Anion gap 5 5 - 15  CBC     Status: Abnormal   Collection Time: 10/15/16  3:28 AM  Result Value Ref Range   WBC 5.6 3.6 - 11.0 K/uL   RBC 2.48 (L) 3.80 - 5.20 MIL/uL   Hemoglobin 9.0 (L) 12.0 - 16.0 g/dL   HCT 26.2 (L) 35.0 - 47.0 %   MCV 105.9 (H) 80.0 - 100.0 fL   MCH 36.4 (H) 26.0 - 34.0 pg   MCHC 34.3 32.0 - 36.0 g/dL   RDW 15.4 (H) 11.5 - 14.5 %   Platelets 126 (L) 150 - 440 K/uL  Urinalysis, Complete w Microscopic     Status: Abnormal   Collection Time: 10/15/16  6:50 AM  Result Value Ref Range   Color, Urine AMBER (A) YELLOW    Comment: BIOCHEMICALS MAY BE AFFECTED BY COLOR   APPearance TURBID (A) CLEAR   Specific Gravity, Urine 1.038 (H) 1.005 - 1.030   pH 5.0 5.0 - 8.0   Glucose, UA NEGATIVE NEGATIVE mg/dL   Hgb urine dipstick SMALL (A) NEGATIVE   Bilirubin Urine SMALL (A) NEGATIVE   Ketones, ur NEGATIVE NEGATIVE mg/dL   Protein, ur 100 (A) NEGATIVE mg/dL   Nitrite NEGATIVE NEGATIVE   Leukocytes, UA MODERATE (A) NEGATIVE   RBC / HPF TOO NUMEROUS TO COUNT 0 - 5 RBC/hpf   WBC, UA TOO NUMEROUS TO COUNT 0 - 5 WBC/hpf   Bacteria, UA NONE SEEN NONE SEEN   Squamous Epithelial / LPF 0-5 (A) NONE SEEN   WBC Clumps PRESENT    Mucous PRESENT     Hyaline Casts, UA PRESENT   Glucose, capillary     Status: None   Collection Time: 10/15/16  7:31 AM  Result Value Ref Range   Glucose-Capillary 87 65 - 99 mg/dL   Comment 1 Notify RN    Comment 2 Document in Chart   MRSA PCR Screening     Status: Abnormal   Collection Time: 10/15/16  9:55 AM  Result Value Ref Range   MRSA by PCR POSITIVE (A) NEGATIVE    Comment:        The GeneXpert MRSA Assay (FDA approved for NASAL specimens only), is one component of a comprehensive MRSA colonization surveillance program. It is not intended to diagnose MRSA infection nor to guide or monitor treatment for MRSA infections. RESULT CALLED TO, READ BACK BY AND VERIFIED WITH: ALLY SPEICHER ON 10/15/16  AT [address]   Glucose, capillary     Status: None   Collection Time: 10/15/16 11:28 AM  Result Value Ref Range   Glucose-Capillary 83 65 - 99 mg/dL   Comment 1 Notify RN    Comment 2 Document in Chart   Potassium     Status: Abnormal   Collection Time: 10/15/16 12:12 PM  Result Value Ref Range   Potassium 3.3 (L) 3.5 - 5.1 mmol/L    Dg Chest 1 View  Result Date: 10/14/2016 CLINICAL DATA:  Recent discharge from hospital, now unable to walk or stand independently. pt is having weakness and bilat lower extremity pain and swelling. Pt also has skin sloughing with bilat ulcerated areas to her lower legs and feet. EXAM: CHEST 1 VIEW COMPARISON:  Chest x-ray dated 09/27/2016. FINDINGS: Study is hypoinspiratory with crowding of the perihilar bronchovascular markings. Given the low lung volumes, lungs appear clear. No pleural effusion or pneumothorax seen. Heart size and mediastinal contours are normal. No acute or suspicious osseous finding. IMPRESSION: Low lung volumes. No active disease. No evidence of pneumonia or pulmonary edema. Electronically Signed   By: Franki Cabot M.D.   On: 10/14/2016 17:32   US Venous Img Lower Bilateral  Result Date: 10/15/2016 CLINICAL DATA:  Leg swelling. EXAM: BILATERAL  LOWER EXTREMITY VENOUS DOPPLER ULTRASOUND TECHNIQUE: Gray-scale sonography with graded compression, as well as color Doppler and duplex ultrasound were performed to evaluate the lower extremity deep venous systems from the level of the common femoral vein and including the common femoral, femoral, profunda femoral, popliteal and calf veins including the posterior tibial, peroneal and gastrocnemius veins when visible. The superficial great saphenous vein was also interrogated. Spectral Doppler was utilized to evaluate flow at rest and with distal augmentation maneuvers in the common femoral, femoral and popliteal veins. COMPARISON:  Knee MRI 06/01/2016 FINDINGS: RIGHT LOWER EXTREMITY Common Femoral Vein: No evidence of thrombus. Normal compressibility, respiratory phasicity and response to augmentation. Saphenofemoral Junction: No evidence of thrombus. Normal compressibility and flow on color Doppler imaging. Profunda Femoral Vein: No evidence of thrombus. Normal compressibility and flow on color Doppler imaging. Femoral Vein: No evidence of thrombus. Normal compressibility, respiratory phasicity and response to augmentation. Popliteal Vein: No evidence of thrombus. Normal compressibility, respiratory phasicity and response to augmentation. Calf Veins: No evidence of thrombus. Normal compressibility and flow on color Doppler imaging. Superficial Great Saphenous Vein: No evidence of thrombus. Normal compressibility and flow on color Doppler imaging. Other Findings:  Subcutaneous edema. LEFT LOWER EXTREMITY Common Femoral Vein: No evidence of thrombus. Normal compressibility, respiratory phasicity and response to augmentation. Saphenofemoral Junction: No evidence of thrombus. Normal compressibility and flow on color Doppler imaging. Profunda Femoral Vein: No evidence of thrombus. Normal compressibility and flow on color Doppler imaging. Femoral Vein: No evidence of thrombus. Normal compressibility, respiratory  phasicity and response to augmentation. Popliteal Vein: No evidence of thrombus. Normal compressibility, respiratory phasicity and response to augmentation. Calf Veins: No evidence of thrombus. Normal compressibility and flow on color Doppler imaging. Superficial Great Saphenous Vein: No evidence of thrombus. Normal compressibility and flow on color Doppler imaging. Other Findings:  Subcutaneous edema. IMPRESSION: No evidence of DVT within either lower extremity. Bilateral subcutaneous edema. Electronically Signed   By: Suzy Bouchard M.D.   On: 10/15/2016 10:55    Review of Systems  Constitutional: Positive for malaise/fatigue.  Cardiovascular: Positive for leg swelling. Negative for chest pain and palpitations.  Skin: Positive for rash.  Neurological: Positive for loss of consciousness  and weakness. Negative for sensory change, focal weakness and seizures.   Blood pressure 104/71, pulse 90, temperature 97.9 F (36.6 C), temperature source Oral, resp. rate 16, height _0  (1.549 m), weight 82.1 kg (181 lb), SpO2 100 %. Physical Exam  Nursing note and vitals reviewed. Constitutional:  Ill appearing  Cardiovascular: Normal rate, regular rhythm and intact distal pulses.   Palpable pedal pulses, feet warm  Respiratory: Effort normal and breath sounds normal.  GI: Soft. She exhibits no distension. There is no tenderness.  Musculoskeletal: She exhibits edema and tenderness.  Cellulitis, skin scaling, various stages of healing scabs, tender, draining blisters posterior.  Neurological:  Confused. Lethargic  Skin: There is erythema.    Assessment/Plan: Cellulitis and Peripheral edema with poor skin care. Hypothyroidism Poor nutrition  All are contributing factors to patients lower extremity edema and cellulitis.  No evidence of arterial disease. Although no DVT may have venous insufficiency.  Recommend:  Cleaning of legs with dilute gentle cleanser.  Wound care consult for further  care. Consider Silvadene over areas of excoriation.  ABX for cellulitis.  Aggressive nutrition and Hypothyroid treatment.  Elevation of legs.   No inpatient Vascular Surgery intervention at this time.  Jamesetta So A 10/15/2016, 6:12 PM

## 2016-10-15 NOTE — Care Management Note (Addendum)
Case Management Note  Patient Details  Name: Glenford PeersCrystal Tamer MRN: [id number] Date of Birth: 07/17/1975  Subjective/Objective:      40yo Ms Glenford PeersCrystal Hentges was admitted with bilateral lower extremity cellulitis. She is currently open to [facility] for PT. [place] in [place] and reports that she is currently between PCP's. She states "My old doctor fired me and I have not located another doctor because they are all booked up for 6 or 7 months.. [place] and I [place]CP in [place]."  Pharmacy=[facility] on [address] in [place]. She reports having a RW and a BSC at home from previous hospital admissions. She has no home oxygen. Her brother transports her to her appointments. Anticipate discharge home with possible IV antibiotics. May need a nebulizer machine. Case management will follow for discharge planning.               Action/Plan:   Expected Discharge Date:                  Expected Discharge Plan:     In-House Referral:     Discharge planning Services     Post Acute Care Choice:    Choice offered to:     DME Arranged:    DME Agency:     HH Arranged:    HH Agency:   [facility] to [facility].  Status of Service:   Ongoing  If discussed at Long Length of Stay Meetings, dates discussed:    Additional Comments:  Quetzalli Clos A, RN 10/15/2016, 4:20 PM

## 2016-10-15 NOTE — Progress Notes (Signed)
Pt being transported to ultrasound now.  Dorothy SlickAlison L Treyven Dyer

## 2016-10-15 NOTE — Progress Notes (Signed)
Initial Nutrition Assessment  DOCUMENTATION CODES:   Severe malnutrition in context of chronic illness, Obesity unspecified  INTERVENTION:  Recommend liberalizing to regular diet.  Provide Premier Protein po TID, each supplement provides 160 kcal and 30 grams of protein.  Recommended vitamin/mineral supplements:  -Central Chewable Multivitamin BID for one month. Take once daily after one month. -500 mg Calcium Citrate with Vitamin D3 TID. -Super B-Complex vitamin once daily. -Fish Oil/Omega-3 supplement once daily.  Reviewed "High-Calorie, High-Protein Nutrition Therapy" and "Bariatric Liquid Protein Supplements" handouts from the Academy of Nutrition and Dietetics. Also provided patient with written instructions for her daily nutrition goals (calorie, protein, fluid) and vitamin/mineral supplements (have already been previously prescribed by an MD and are listed on her home medications). Encouraged patient to eat 6 small meals per day. Discussed that she will need to drink an oral nutrition supplement 2-3 times daily to help meet her calorie and protein needs.  Patient would benefit from being followed yearly by a bariatric surgeon.   NUTRITION DIAGNOSIS:   Malnutrition (Severe) related to chronic illness (altered GI function following Roux-en-Y gastric bypass in 2003) as evidenced by energy intake < or equal to 75% for > or equal to 1 month, moderate depletion of body fat, moderate depletions of muscle mass, severe fluid accumulation.  GOAL:   Patient will meet greater than or equal to 90% of their needs  MONITOR:   PO intake, Supplement acceptance, Labs, Weight trends, I & O's, Skin  REASON FOR ASSESSMENT:   Consult Assessment of nutrition requirement/status  ASSESSMENT:   41 year old female with PMHx of hypothyroidism, anemia, anxiety, arthritis, acid reflux, HTN, osteoporosis, hx of Roux-en-Y gastric bypass in 2003, recent diagnosis of clavicular fracture, avascular  necrosis of hip who presents with bilateral lower extremity redness and drainage, found to have bilateral lower extremity cellulitis and UTI.   Spoke with patient at bedside. Her brother Riki RuskJeremy also came into the room later on during assessment. Patient lives at home with her son, who has autism. She reports that she had a Roux-en-Y gastric bypass in May of 2003 at [facility] in [place], [place]. She reports she did not really want the surgery, but she was told it would help with her reflux. She reports she has had bilateral edema in legs for the past 3 years. She has trouble tolerating meals. She only eats 2 meals per day. She may have a cheese omelette made with 2 eggs and one slice of cheese or 2 eggs scrambled with one slice of cheese (she cooks eggs with oil). She reports she does not eat a lot of meat but she enjoys chicken. She may prepare soups such as beef and vegetable soup for her and her son. This is a daily intake of only 703 kcal (39% minimum estimated kcal needs) and 39 grams of protein at best. The other day family brought her chicken chimichanga with refried beans. She finishes approximately 50% of meals from restaurants and cannot finish all of meals prepared at home, either. Patient reports she is no longer taking her vitamin/mineral supplements or drinking any oral nutrition supplements. She does not currently follow-up with any surgeons since [place]. She has experienced dumping syndrome before, so she avoids concentrated sweets.  Dry weight is 174 lbs (79.1 kg). Patient reports she weighed approximately 300 lbs prior to her surgery in 2003. She lost down to 174 lbs slowly over time, which was her UBW before she started  having edema in her legs. She reports her weight fluctuates significantly with the fluid - up to 10-15 lbs in one day.   Medications reviewed and include: Colace, Lasix 40 mg daily, levothyroxine, magnesium oxide 400 mg BID, MVI  daily, omega-3 acid ethyl esters 1 gram daily, pantoprazole, potassium chloride 20 mEq daily, thiamine 100 mg daily, vitamin B12 1000 micrograms daily, Zosyn, vancomycin.  Labs reviewed: CBG 83-87, Potassium 2.6, Chloride 113. Hgb 9, HCT 26.2, MCV 105.9, RDW 15.4.   Nutrition-Focused physical exam completed. Findings are moderate fat depletion, mild-moderate muscle depletion, and severe edema. On micronutrient exam noted dry, dull hair that is brittle, pallor skin, xerosis of skin, and spoon-shaped nails. Patient is at risk for deficiency of protein, iron, essential fatty acids, thiamine, vitamin B12, vitamin B6, and copper.  Unable to assess patient's gait. She reports she has not been able to walk well with the pain in her feet.  Discussed with RN. Also discussed patient with Dr. Elisabeth PigeonVachhani this morning. Per MD patient is looking into surgeons in the area she can follow with.  Diet Order:  DIET SOFT Room service appropriate? Yes; Fluid consistency: Thin  Skin:  Wound (see comment) (Stg II to thigh (2 wounds) and left buttocks, Stg I coccyx)  Last BM:  10/14/2016  Height:   Ht Readings from Last 1 Encounters:  10/14/16 5\' 1"  (1.549 m)    Weight:   Wt Readings from Last 1 Encounters:  10/15/16 181 lb (82.1 kg)    Ideal Body Weight:  47.7 kg  BMI:  Body mass index is 34.2 kg/m.  Estimated Nutritional Needs:   Kcal:  1810-2090 (MSJ x 1.3-1.5)  Protein:  100-120 grams (1.3-1.5 grams/kg)  Fluid:  1.8-2 L/day (1 ml/kcal)  EDUCATION NEEDS:   Education needs addressed  Helane RimaLeanne Delores Edelstein, MS, RD, LDN Pager: [phone] After Hours Pager: [phone]

## 2016-10-15 NOTE — Progress Notes (Signed)
Notified of positive PCR results. Contact precautions initiated and education done with patient and family.   Suzan SlickAlison L Annia Gomm, RN

## 2016-10-15 NOTE — Progress Notes (Signed)
MD text paged for K+ of 2.6. Awaiting response

## 2016-10-15 NOTE — Progress Notes (Signed)
MD returned page ordered Kcl po now and then 40 meq 2hrs later and recheck K+  At 12noon. Orders to be placed. Will cont to monitor

## 2016-10-15 NOTE — Progress Notes (Signed)
PT Cancellation Note  Patient Details Name: Dorothy Dyer MRN: [id number] DOB: 03/20/1975   Cancelled Treatment:    Reason Eval/Treat Not Completed: Medical issues which prohibited therapy. Consult received and chart reviewed. Pt currently with K+ at 2.6 and has pending doppler for possible DVT. Contraindicated for physical therapy at this time. Will hold until next date if medically stable.   Jhamir Pickup 10/15/2016, 8:24 AM  Elizabeth PalauStephanie Shatora [place]bee, PT, DPT [phone]

## 2016-10-16 LAB — BASIC METABOLIC PANEL
Anion gap: 4 — ABNORMAL LOW (ref 5–15)
BUN: 13 mg/dL (ref 6–20)
CALCIUM: 7.5 mg/dL — AB (ref 8.9–10.3)
CO2: 26 mmol/L (ref 22–32)
Chloride: 112 mmol/L — ABNORMAL HIGH (ref 101–111)
Creatinine, Ser: 0.8 mg/dL (ref 0.44–1.00)
GFR calc Af Amer: >60 mL/min (ref >60)
GLUCOSE: 78 mg/dL (ref 65–99)
POTASSIUM: 3 mmol/L — AB (ref 3.5–5.1)
Sodium: 142 mmol/L (ref 135–145)

## 2016-10-16 LAB — URINE CULTURE

## 2016-10-16 LAB — CBC
HEMATOCRIT: 26.4 % — AB (ref 35.0–47.0)
Hemoglobin: 8.8 g/dL — ABNORMAL LOW (ref 12.0–16.0)
MCH: 35.5 pg — AB (ref 26.0–34.0)
MCHC: 33.4 g/dL (ref 32.0–36.0)
MCV: 106.2 fL — AB (ref 80.0–100.0)
PLATELETS: 114 10*3/uL — AB (ref 150–440)
RBC: 2.49 MIL/uL — AB (ref 3.80–5.20)
RDW: 15.4 % — AB (ref 11.5–14.5)
WBC: 4.1 10*3/uL (ref 3.6–11.0)

## 2016-10-16 LAB — GLUCOSE, CAPILLARY: GLUCOSE-CAPILLARY: 75 mg/dL (ref 65–99)

## 2016-10-16 LAB — MAGNESIUM: Magnesium: 1.8 mg/dL (ref 1.7–2.4)

## 2016-10-16 LAB — HEMOGLOBIN A1C
Hgb A1c MFr Bld: <4.2 % — ABNORMAL LOW (ref 4.8–5.6)
Mean Plasma Glucose: <74 mg/dL

## 2016-10-16 MED ORDER — CEPHALEXIN 250 MG PO CAPS: 250.0000 mg | ORAL_CAPSULE | Freq: Two times a day (BID) | ORAL | 0 refills | Status: DC

## 2016-10-16 MED ORDER — POTASSIUM CHLORIDE CRYS ER 20 MEQ PO TBCR: 40.0000 meq | EXTENDED_RELEASE_TABLET | Freq: Once | ORAL | Status: DC

## 2016-10-16 NOTE — Care Management Note (Signed)
Case Management Note  Patient Details  Name: Alanta Scobey MRN: [id number] Date of Birth: May 02, 1975  Subjective/Objective:  Met with patient at bedside. She is [facility] with [facility]. Will add SN, HHA and SW. Patient agreeable. [facility] updated.                     Action/Plan: Discharging today with [place] and SW. No DME needs.   Expected Discharge Date:  10/16/16               Expected Discharge Plan:  [place]  In-House Referral:   (Requesting a PCP in [place].)  Discharge planning Services  CM Consult  Post Acute Care Choice:  Home Health Choice offered to:  Patient (already open to [place]. for PT)  DME Arranged:    DME Agency:  [facility]. (Open to [place] )  [place] Arranged:  RN, PT, Nurse's Aide, Social Work Control and instrumentation engineer, resume PT, possibly SW. ) [place]:  [facility] (Already open to [place])  Status of Service:  Completed, signed off  If discussed at [employer], dates discussed:    Additional Comments:  Jolly Mango, RN 10/16/2016, 10:46 AM

## 2016-10-16 NOTE — Care Management Important Message (Signed)
Important Message  Patient Details  Name: Dorothy Dyer MRN: [id number] Date of Birth: 12/03/1975   Medicare Important Message Given:  N/A - LOS <3 / Initial given by admissions    Marily MemosLisa M Delshon Blanchfield, RN 10/16/2016, 10:50 AM

## 2016-10-16 NOTE — Discharge Instructions (Signed)

## 2016-10-16 NOTE — Evaluation (Signed)
Occupational Therapy Evaluation Patient Details Name: Dorothy Dyer MRN: [id number] DOB: 1975/11/30 Today's Date: 10/16/2016    History of Present Illness Pt. is a 41 y.o. female who was admitted to [facility] with BLE Cellulitis, recent right claviclular fracture. Pt. PMHx includes: avascular necrosis, psoriatic Arthritis, HTN, Osteoporosis, clotting disorder, and thyroid Disease.    Clinical Impression   Pt. is a 41 y.o. female who was admitted to [facility] with BLE Cellulitis. Pt. presents with weakness, limited RUE ROM, pain, and limited mobility which impact her ability to complete basic ADL tasks. Pt. has an 56 year old son with Autism. Pt.'s family assists. Pt. reports she is planning to return home, and plans to resume with Home Health PT services. Pt. reports that she does not need OT services to follow-up at home. Pt. reports having HHOT services , and they have stopped coming. Education was provided about the proper sockaide use verbally, and through visual demonstration per pt. request as she has a sockaide at home, and needed to review the proper technique for sock application, and position of the ropes. Pt. is planning to discharge home today. No further OT services are indicated at this time.    Follow Up Recommendations  No OT follow up    Equipment Recommendations   (Pt. reports she would like a Lond handled sponge.)    Recommendations for Other Services       Precautions / Restrictions Precautions Precautions: Fall Restrictions Weight Bearing Restrictions: No                                                    ADL either performed or assessed with clinical judgement   ADL Overall ADL's : Needs assistance/impaired Eating/Feeding: Set up   Grooming: Set up   Upper Body Bathing: Min guard   Lower Body Bathing: Maximal assistance   Upper Body Dressing : Minimal assistance   Lower Body Dressing: Maximal assistance      Functional Mobility:  deferred           General ADL Comments: Pt. education was provided about A/E use for LE ADLs.     Vision         Perception     Praxis      Pertinent Vitals/Pain Pain Assessment: 0-10 Pain Score: 6  Pain Location: LEs Pain Descriptors / Indicators: Aching Pain Intervention(s): Monitored during session     Hand Dominance Right   Extremity/Trunk Assessment Upper Extremity Assessment Upper Extremity Assessment: RUE deficits/detail RUE Deficits / Details: RUE strength testing deferred secondary to recent Right clavicular fracture with edema distally. Generalized weakness in the LUE.          Communication Communication Communication: No difficulties   Cognition Arousal/Alertness: Awake/alert Behavior During Therapy: WFL for tasks assessed/performed Overall Cognitive Status: Within Functional Limits for tasks assessed                                     General Comments       Exercises     Shoulder Instructions      Home Living Family/patient expects to be discharged to:: Private residence Living Arrangements: Children Available Help at Discharge: Other (Comment);Family (Mom and Dad in from out of town) Type of Home: Mobile home Home  Access: Stairs to enter Entergy CorporationEntrance Stairs-Number of Steps: 4 Entrance Stairs-Rails: Left Home Layout: One level     Bathroom Shower/Tub: Producer, television/film/videoWalk-in shower   Bathroom Toilet: Handicapped height     Home Equipment: Environmental consultantWalker - 2 wheels;Bedside commode;Shower seat;Toilet riser          Prior Functioning/Environment Level of Independence: Independent with assistive device(s)        Comments: Pt. resides with her autistic 43 year old son. Pt. has family available to assist. Pt. was receiving home health PT services prior to admission. Pt. reports OT had stopped coming. Pt. reports she was driving, and able to prepare light meals. Pt. utilized the [employer] drive through. for meals.        OT Problem List:  Decreased strength;Decreased range of motion;Pain;Decreased activity tolerance      OT Treatment/Interventions:      OT Goals(Current goals can be found in the care plan section) Acute Rehab OT Goals Patient Stated Goal: To return home OT Goal Formulation: With patient Potential to Achieve Goals: Good  OT Frequency:     Barriers to D/C:            Co-evaluation              AM-PAC PT "6 Clicks" Daily Activity     Outcome Measure Help from another person eating meals?: A Little Help from another person taking care of personal grooming?: A Little Help from another person toileting, which includes using toliet, bedpan, or urinal?: A Lot Help from another person bathing (including washing, rinsing, drying)?: A Lot Help from another person to put on and taking off regular upper body clothing?: A Little Help from another person to put on and taking off regular lower body clothing?: A Lot 6 Click Score: 15   End of Session    Activity Tolerance: Patient tolerated treatment well Patient left: in chair;with call bell/phone within reach;with chair alarm set  OT Visit Diagnosis: History of falling (Z91.81);Muscle weakness (generalized) (M62.81)                Time: [id number] OT Time Calculation (min): 22 min Charges:  OT General Charges $OT Visit: 1 Procedure OT Evaluation $OT Eval Low Complexity: 1 Procedure G-Codes:     Olegario MessierElaine Rease Swinson, MS, OTR/L   Olegario MessierElaine Kipper Buch, MS, OTR/L 10/16/2016, 11:51 AM

## 2016-10-16 NOTE — Progress Notes (Signed)
Patient is alert and oriented and able to verbalize needs. No complaints of pain at this time. Vital signs stable. PIV removed. Discharge instructions gone over with patient at this time. Printed AVS given to patient. Patient verbalizes understanding of all instructions and follow up care. HH set up by RNCM. Belongings packed up by NT. Patient called family member for transportation home.   Suzan SlickAlison L Dallys Nowakowski, RN

## 2016-10-16 NOTE — Evaluation (Signed)
Physical Therapy Evaluation Patient Details Name: Dorothy Dyer MRN: [id number] DOB: [birth year] Today's Date: 10/16/2016   History of Present Illness  Dorothy Dyer  is a 41 y.o. female with a known history of psoriatic arthritis, left hip pain, frequent falls, recent diagnosis of R clavicular fracture, avascular necrosis of the hip, hypokalemia, hypomagnesemia, gastro-esophageal reflux disease, hypothyroidism, essential hypertension, ongoing tobacco abuse, who presents to the hospital with complaints of bilateral lower extremity redness and drainage. She feels fatigued and weak, has significant bilateral lower extremity pains. On arrival to the hospital, she was noted to have bilateral lower extremity cellulitis and hospitalist services were contacted for admission. She was noted to be tachycardic with heart rate around 105 in ER, However, white blood cell count was normal. She denied any fevers or chills. She was recently diagnosed with clavicular fracture on the right side, discharged to home with a sling, however, was not able to use due to need for RUE support on walker. During last admission fracture separation was noted to be worsening.   Clinical Impression  Pt admitted with above diagnosis. Pt currently with functional limitations due to the deficits listed below (see PT Problem List).  Pt is grossly weak with poor mobility. She requires modA+1 and extended time for bed mobility with heavy use of UE on bed rails. MinA+1 for sit to stand transfers and ambulation. She is able to take very short, shuffling steps with walker to transfer into recliner. She is very unsteady and weak in standing. Pt requires increased effort to complete transfer to recliner. Pt has a worsening R clavicular fracture and has been noncompliant with immobilization of RUE for multiple months now. She reports that she is too weak to ambulate without RUE support on a rolling walker and since insurance has denied SNF  placement during last admission she had no other option but to use RUE for support. She would benefit from SNF placement due to gross weakness and deconditioning. Pt will benefit from PT services to address deficits in strength, balance, and mobility in order to return to full function at home.     Follow Up Recommendations SNF    Equipment Recommendations  None recommended by PT    Recommendations for Other Services OT consult     Precautions / Restrictions Precautions Precautions: Fall Restrictions Weight Bearing Restrictions: No      Mobility  Bed Mobility Overal bed mobility: Needs Assistance Bed Mobility: Supine to Sit     Supine to sit: Mod assist     General bed mobility comments: Pt requires modA+1 for bilateral LEs as well as increased time to come upright to sitting at EOB. Heavy LUE reliance on bed rail. HOB elevated. Fair static sitting balance  Transfers Overall transfer level: Needs assistance Equipment used: Rolling walker (2 wheeled) Transfers: Sit to/from Stand Sit to Stand: Min assist         General transfer comment: Pt requires minA+1 for transfers. Safe hand placement demonstrated without cues.   Ambulation/Gait Ambulation/Gait assistance: Min assist Ambulation Distance (Feet): 5 Feet Assistive device: Rolling walker (2 wheeled) Gait Pattern/deviations: Decreased step length - right;Decreased step length - left Gait velocity: Decreased Gait velocity interpretation: <1.8 ft/sec, indicative of risk for recurrent falls General Gait Details: Pt able to take very short, shuffling steps with walker to transfer into recliner. She is very unsteady and weak in standing. Pt requires increased effort to complete transfer to recliner.  Stairs  Wheelchair Mobility    Modified Rankin (Stroke Patients Only)       Balance Overall balance assessment: Needs assistance Sitting-balance support: No upper extremity supported Sitting  balance-Leahy Scale: Fair     Standing balance support: Bilateral upper extremity supported Standing balance-Leahy Scale: Poor Standing balance comment: Requires bilateral UE support for balance                             Pertinent Vitals/Pain Pain Assessment: 0-10 Pain Score: 6  Pain Location: LEs Pain Descriptors / Indicators: Aching Pain Intervention(s): Monitored during session    Home Living Family/patient expects to be discharged to:: Private residence Living Arrangements: Children Available Help at Discharge: Other (Comment);Family (Mom and Dad in from out of town) Type of Home: Mobile home Home Access: Stairs to enter Entrance Stairs-Rails: Left Entrance Stairs-Number of Steps: 4 Home Layout: One level Home Equipment: Walker - 2 wheels;Bedside commode;Shower seat;Toilet riser      Prior Function Level of Independence: Independent with assistive device(s)         Comments: Pt. resides with her autistic 43 year old son. Pt. has family available to assist. Pt. was receiving home health PT services prior to admission. Pt. reports OT had stopped coming. Pt. reports she was driving, and able to prepare light meals.,pt. utilized the [employer] drive through. for meals.     Hand Dominance   Dominant Hand: Right    Extremity/Trunk Assessment   Upper Extremity Assessment Upper Extremity Assessment: RUE deficits/detail RUE Deficits / Details: RUE strength testing deferred due to history of R clavicular fracture. Pt has been noncompliant for extended period with immobilization. LUE is grossly weak    Lower Extremity Assessment Lower Extremity Assessment: LLE deficits/detail LLE Deficits / Details: Generally weak throughout bilateral LEs. No formal MMT performed secondary to LLE pain. Pt with significant functional weakness with sit to stand transfers.       Communication   Communication: No difficulties  Cognition Arousal/Alertness: Awake/alert Behavior  During Therapy: WFL for tasks assessed/performed Overall Cognitive Status: Within Functional Limits for tasks assessed                                        General Comments      Exercises     Assessment/Plan    PT Assessment Patient needs continued PT services  PT Problem List Decreased strength;Decreased activity tolerance;Decreased balance;Decreased mobility;Decreased knowledge of use of DME;Decreased safety awareness;Decreased knowledge of precautions;Pain;Obesity       PT Treatment Interventions DME instruction;Gait training;Stair training;Functional mobility training;Therapeutic exercise;Therapeutic activities;Balance training;Neuromuscular re-education;Patient/family education    PT Goals (Current goals can be found in the Care Plan section)  Acute Rehab PT Goals Patient Stated Goal: To return home PT Goal Formulation: With patient Time For Goal Achievement: 10/30/16 Potential to Achieve Goals: Fair    Frequency Min 2X/week   Barriers to discharge        Co-evaluation               AM-PAC PT "6 Clicks" Daily Activity  Outcome Measure Difficulty turning over in bed (including adjusting bedclothes, sheets and blankets)?: Total Difficulty moving from lying on back to sitting on the side of the bed? : Total Difficulty sitting down on and standing up from a chair with arms (e.g., wheelchair, bedside commode, etc,.)?: Total Help needed moving to  and from a bed to chair (including a wheelchair)?: A Little Help needed walking in hospital room?: A Little Help needed climbing 3-5 steps with a railing? : Total 6 Click Score: 10    End of Session Equipment Utilized During Treatment: Gait belt Activity Tolerance: Patient limited by fatigue Patient left: in chair;with call bell/phone within reach;with chair alarm set Nurse Communication: Mobility status;Other (comment) (Mobility status written on dry erase board) PT Visit Diagnosis: Unsteadiness on  feet (R26.81);Repeated falls (R29.6);Muscle weakness (generalized) (M62.81);Pain;Difficulty in walking, not elsewhere classified (R26.2) Pain - Right/Left: Right Pain - part of body: Ankle and joints of foot    Time: 4098-1191 PT Time Calculation (min) (ACUTE ONLY): 22 min   Charges:   PT Evaluation $PT Eval Moderate Complexity: 1 Mod     PT G Codes:   PT G-Codes **NOT FOR INPATIENT CLASS** Functional Assessment Tool Used: AM-PAC 6 Clicks Basic Mobility Functional Limitation: Mobility: Walking and moving around Mobility: Walking and Moving Around Current Status (Y7829): At least 60 percent but less than 80 percent impaired, limited or restricted Mobility: Walking and Moving Around Goal Status [phone]): At least 40 percent but less than 60 percent impaired, limited or restricted    Lynnea Maizes PT, DPT    Huprich,Jason 10/16/2016, 11:56 AM

## 2016-10-17 DIAGNOSIS — R69 Illness, unspecified: Secondary | ICD-10-CM | POA: Diagnosis not present

## 2016-10-17 DIAGNOSIS — Z79891 Long term (current) use of opiate analgesic: Secondary | ICD-10-CM | POA: Diagnosis not present

## 2016-10-17 DIAGNOSIS — S42021D Displaced fracture of shaft of right clavicle, subsequent encounter for fracture with routine healing: Secondary | ICD-10-CM | POA: Diagnosis not present

## 2016-10-17 DIAGNOSIS — M542 Cervicalgia: Secondary | ICD-10-CM | POA: Diagnosis not present

## 2016-10-17 DIAGNOSIS — I10 Essential (primary) hypertension: Secondary | ICD-10-CM | POA: Diagnosis not present

## 2016-10-17 DIAGNOSIS — M25552 Pain in left hip: Secondary | ICD-10-CM | POA: Diagnosis not present

## 2016-10-17 DIAGNOSIS — Z791 Long term (current) use of non-steroidal anti-inflammatories (NSAID): Secondary | ICD-10-CM | POA: Diagnosis not present

## 2016-10-17 DIAGNOSIS — R296 Repeated falls: Secondary | ICD-10-CM | POA: Diagnosis not present

## 2016-10-17 DIAGNOSIS — E039 Hypothyroidism, unspecified: Secondary | ICD-10-CM | POA: Diagnosis not present

## 2016-10-17 DIAGNOSIS — K219 Gastro-esophageal reflux disease without esophagitis: Secondary | ICD-10-CM | POA: Diagnosis not present

## 2016-10-18 NOTE — Discharge Summary (Signed)
[facility] [place] [facility]   PATIENT NAME: Dorothy Dyer    MR#:  [id number]  DATE OF BIRTH:  11/26/1975  DATE OF ADMISSION:  10/14/2016   ADMITTING PHYSICIAN: Katharina Caperima Vaickute, MD  DATE OF DISCHARGE: 10/16/2016 12:37 PM  PRIMARY CARE PHYSICIAN: Patient, No Pcp Per   ADMISSION DIAGNOSIS:  Weakness [R53.1] Leg swelling [M79.89] Cellulitis of left lower extremity [L03.116] DISCHARGE DIAGNOSIS:  Active Problems:   Bilateral lower leg cellulitis   Leg swelling   Pressure injury of skin  SECONDARY DIAGNOSIS:   Past Medical History:  Diagnosis Date  . Abdominal pain, chronic, generalized   . Acid reflux   . Anemia   . Anxiety   . Arthritis   . AVN of femur (HCC)    left hip  . Clotting disorder (HCC)    undetermined  . Cystitis   . 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 Hypertension   . Neuromuscular disorder (HCC)   . Osteoporosis   . Psoriasis   . Thyroid disease    HOSPITAL COURSE:  41 y.o. female with a known history of Psoriatic arthritis, left hip pain, frequent falls, recent diagnosis of clavicular fracture, avascular necrosis of the hip, hypokalemia, hypomagnesemia, gastro-esophageal reflux disease, hypothyroidism, essential hypertension, ongoing tobacco abuse, admitted to the hospital with complaints of bilateral lower extremity redness and drainage  #1. Bilateral lower extremity cellulitis, and UTI (based on UA, urine c/s grew mixed organisms) Improved with Abx #2. Lower extremity swelling, ruled out deep vein thrombosis, negative Doppler ultrasound #3. moderate Malnutrition with low albumin level  Due to hx of gastric bypass sx and not taking nutrients and not following diet guidelines.  #4. Hypothyroidism: TSH somewhat high, may need adjustment in synthroid as an outpt if continues to be high #5. hypokalemia   Replaced DISCHARGE CONDITIONS:  stable CONSULTS OBTAINED:  Treatment Team:  Bertram DenverEsco, Miechia A, MD DRUG  ALLERGIES:   Allergies  Allergen Reactions  . Ibuprofen Nausea Only and Other (See Comments)    Bad stomach pains  . Toradol [Ketorolac Tromethamine] Anxiety  . Tylenol [Acetaminophen]   . Aspirin Hives  . Cleocin [Clindamycin Hcl] Itching and Rash   DISCHARGE MEDICATIONS:   Allergies as of 10/16/2016      Reactions   Ibuprofen Nausea Only, Other (See Comments)   Bad stomach pains   Toradol [ketorolac Tromethamine] Anxiety   Tylenol [acetaminophen]    Aspirin Hives   Cleocin [clindamycin Hcl] Itching, Rash      Medication List    TAKE these medications   acetaminophen 325 MG tablet Commonly known as:  TYLENOL Take 2 tablets (650 mg total) by mouth every 6 (six) hours as needed for mild pain (or Fever >/= 101).   ALPRAZolam 0.5 MG tablet Commonly known as:  XANAX Take 1 tablet (0.5 mg total) by mouth 3 (three) times daily as needed for anxiety.   B-complex with vitamin C tablet Take 1 tablet by mouth daily.   calcium-vitamin D 500-200 MG-UNIT tablet Commonly known as:  OSCAL WITH D Take 2 tablets by mouth daily with breakfast.   cephALEXin 250 MG capsule Commonly known as:  KEFLEX Take 1 capsule (250 mg total) by mouth 2 (two) times daily.   docusate sodium 100 MG capsule Commonly known as:  COLACE Take 1 capsule (100 mg total) by mouth 2 (two) times daily as needed for mild constipation.   furosemide 40 MG tablet Commonly known as:  LASIX Take 1 tablet (  40 mg total) by mouth daily as needed. What changed:  reasons to take this   levothyroxine 137 MCG tablet Commonly known as:  SYNTHROID, LEVOTHROID Take 137 mcg by mouth every other day. Alternating with 125 mcg tablet. What changed:  Another medication with the same name was changed. Make sure you understand how and when to take each.   levothyroxine 125 MCG tablet Commonly known as:  SYNTHROID, LEVOTHROID TAKE 1 TABLET EVERY DAY What changed:  See the new instructions.   LYRICA 225 MG capsule Generic  drug:  pregabalin TAKE 1 CAPSULE BY MOUTH TWICE DAILY   Magnesium Oxide 400 MG Caps Take 1 capsule (400 mg total) by mouth 2 (two) times daily.   metolazone 5 MG tablet Commonly known as:  ZAROXOLYN TAKE 1 TABLET BY MOUTH EVERY DAY 30 MINS BEFORE LASIX 1-2 TIMES PER WEEK.   multivitamin with minerals Tabs tablet Take 1 tablet by mouth 2 (two) times daily.   naproxen sodium 220 MG tablet Commonly known as:  ANAPROX Take 220 mg by mouth 2 (two) times daily with a meal.   nortriptyline 75 MG capsule Commonly known as:  PAMELOR TAKE ONE CAPSULE BY MOUTH EVERY EVENING AT BEDTIME   omega-3 acid ethyl esters 1 g capsule Commonly known as:  LOVAZA Take 1 capsule (1 g total) by mouth daily.   oxyCODONE-acetaminophen 5-325 MG tablet Commonly known as:  PERCOCET/ROXICET Take 1 tablet by mouth every 6 (six) hours as needed for severe pain.   potassium chloride SA 20 MEQ tablet Commonly known as:  K-DUR,KLOR-CON Take 1 tablet (20 mEq total) by mouth 2 (two) times daily as needed (when taking lasix). What changed:  when to take this   protein supplement Powd Commonly known as:  UNJURY CHICKEN SOUP Take 27 g (8 oz total) by mouth 2 (two) times daily after a meal.   RABEprazole 20 MG tablet Commonly known as:  ACIPHEX 1 tab po bid What changed:  how much to take  how to take this  when to take this  additional instructions   simethicone 80 MG chewable tablet Commonly known as:  MYLICON Chew 1 tablet (80 mg total) by mouth 4 (four) times daily as needed for flatulence.   thiamine 100 MG tablet Take 1 tablet (100 mg total) by mouth daily.   topiramate 50 MG tablet Commonly known as:  TOPAMAX TAKE 1 TABLET BY MOUTH AT BEDTIME What changed:  See the new instructions.   traMADol 50 MG tablet Commonly known as:  ULTRAM Take 1 tablet (50 mg total) by mouth every 6 (six) hours as needed for moderate pain or severe pain.        DISCHARGE INSTRUCTIONS:   DIET:  Regular  diet DISCHARGE CONDITION:  Good ACTIVITY:  Activity as tolerated OXYGEN:  Home Oxygen: No.  Oxygen Delivery: room air DISCHARGE LOCATION:  home   If you experience worsening of your admission symptoms, develop shortness of breath, life threatening emergency, suicidal or homicidal thoughts you must seek medical attention immediately by calling 911 or calling your MD immediately  if symptoms less severe.  You Must read complete instructions/literature along with all the possible adverse reactions/side effects for all the Medicines you take and that have been prescribed to you. Take any new Medicines after you have completely understood and accpet all the possible adverse reactions/side effects.   Please note  You were cared for by a hospitalist during your hospital stay. If you have any questions about your  discharge medications or the care you received while you were in the hospital after you are discharged, you can call the unit and asked to speak with the hospitalist on call if the hospitalist that took care of you is not available. Once you are discharged, your primary care physician will handle any further medical issues. Please note that NO REFILLS for any discharge medications will be authorized once you are discharged, as it is imperative that you return to your primary care physician (or establish a relationship with a primary care physician if you do not have one) for your aftercare needs so that they can reassess your need for medications and monitor your lab values.    On the day of Discharge:  VITAL SIGNS:  Blood pressure 91/60, pulse 72, temperature 98.2 F (36.8 C), temperature source Oral, resp. rate 18, height 5\' 1"  (1.549 m), weight 82.1 kg (181 lb), SpO2 100 %. PHYSICAL EXAMINATION:  GENERAL:  41 y.o.-year-old patient lying in the bed with no acute distress.  EYES: Pupils equal, round, reactive to light and accommodation. No scleral icterus. Extraocular muscles intact.    HEENT: Head atraumatic, normocephalic. Oropharynx and nasopharynx clear.  NECK:  Supple, no jugular venous distention. No thyroid enlargement, no tenderness.  LUNGS: Normal breath sounds bilaterally, no wheezing, rales,rhonchi or crepitation. No use of accessory muscles of respiration.  CARDIOVASCULAR: S1, S2 normal. No murmurs, rubs, or gallops.  ABDOMEN: Soft, non-tender, non-distended. Bowel sounds present. No organomegaly or mass.  EXTREMITIES: No pedal edema, cyanosis, or clubbing.  NEUROLOGIC: Cranial nerves II through XII are intact. Muscle strength 5/5 in all extremities. Sensation intact. Gait not checked.  PSYCHIATRIC: The patient is alert and oriented x 3.  SKIN: No obvious rash, lesion, or ulcer.  DATA REVIEW:   CBC  Recent Labs Lab 10/16/16 0300  WBC 4.1  HGB 8.8*  HCT 26.4*  PLT 114*    Chemistries   Recent Labs Lab 10/14/16 1634  10/16/16 0300  NA 144  < > 142  K 3.5  < > 3.0*  CL 112*  < > 112*  CO2 24  < > 26  GLUCOSE 82  < > 78  BUN 9  < > 13  CREATININE 0.66  < > 0.80  CALCIUM 7.9*  < > 7.5*  MG 1.8  --  1.8  AST 39  --   --   ALT 42  --   --   ALKPHOS 170*  --   --   BILITOT 1.6*  --   --   < > = values in this interval not displayed.   Follow-up Information    Rankins, Fanny Dance, MD Follow up.   Specialty:  Family Medicine Why:  Office will call Patient with New Patient appt Contact information: [address] [place] [address] [phone]           Management plans discussed with the patient, family and they are in agreement.  CODE STATUS: Prior   TOTAL TIME TAKING CARE OF THIS PATIENT: 45 minutes.    Delfino Lovett M.D on 10/18/2016 at 9:38 PM  Between 7am to 6pm - Pager - [phone]  After 6pm go to [email or web address] - Social research officer, government  [facility]  Office  [phone]  CC: Primary care physician; Patient, No Pcp Per   Note: This dictation was prepared with Dragon dictation along  with smaller phrase technology. Any transcriptional errors that result from this process are unintentional.

## 2016-10-19 DIAGNOSIS — R609 Edema, unspecified: Secondary | ICD-10-CM | POA: Diagnosis not present

## 2016-10-19 LAB — CULTURE, BLOOD (ROUTINE X 2)
CULTURE: NO GROWTH
Culture: NO GROWTH
SPECIAL REQUESTS: ADEQUATE

## 2016-10-20 DIAGNOSIS — M542 Cervicalgia: Secondary | ICD-10-CM | POA: Diagnosis not present

## 2016-10-20 DIAGNOSIS — I10 Essential (primary) hypertension: Secondary | ICD-10-CM | POA: Diagnosis not present

## 2016-10-20 DIAGNOSIS — R296 Repeated falls: Secondary | ICD-10-CM | POA: Diagnosis not present

## 2016-10-20 DIAGNOSIS — Z791 Long term (current) use of non-steroidal anti-inflammatories (NSAID): Secondary | ICD-10-CM | POA: Diagnosis not present

## 2016-10-20 DIAGNOSIS — M25552 Pain in left hip: Secondary | ICD-10-CM | POA: Diagnosis not present

## 2016-10-20 DIAGNOSIS — E039 Hypothyroidism, unspecified: Secondary | ICD-10-CM | POA: Diagnosis not present

## 2016-10-20 DIAGNOSIS — Z79891 Long term (current) use of opiate analgesic: Secondary | ICD-10-CM | POA: Diagnosis not present

## 2016-10-20 DIAGNOSIS — R69 Illness, unspecified: Secondary | ICD-10-CM | POA: Diagnosis not present

## 2016-10-20 DIAGNOSIS — S42021D Displaced fracture of shaft of right clavicle, subsequent encounter for fracture with routine healing: Secondary | ICD-10-CM | POA: Diagnosis not present

## 2016-10-20 DIAGNOSIS — K219 Gastro-esophageal reflux disease without esophagitis: Secondary | ICD-10-CM | POA: Diagnosis not present

## 2016-10-22 ENCOUNTER — Inpatient Hospital Stay (HOSPITAL_COMMUNITY)
Admission: EM | Admit: 2016-10-22 | Discharge: 2016-10-26 | DRG: 917 | Disposition: A | Discharge status: Home / Self Care | Payer: Medicare HMO | Attending: Internal Medicine | Admitting: Internal Medicine

## 2016-10-22 DIAGNOSIS — T391X2A Poisoning by 4-Aminophenol derivatives, intentional self-harm, initial encounter: Secondary | ICD-10-CM

## 2016-10-22 DIAGNOSIS — R600 Localized edema: Secondary | ICD-10-CM | POA: Diagnosis not present

## 2016-10-22 DIAGNOSIS — M810 Age-related osteoporosis without current pathological fracture: Secondary | ICD-10-CM | POA: Diagnosis present

## 2016-10-22 DIAGNOSIS — L03113 Cellulitis of right upper limb: Secondary | ICD-10-CM | POA: Diagnosis present

## 2016-10-22 DIAGNOSIS — M79609 Pain in unspecified limb: Secondary | ICD-10-CM | POA: Diagnosis not present

## 2016-10-22 DIAGNOSIS — R1011 Right upper quadrant pain: Secondary | ICD-10-CM | POA: Diagnosis not present

## 2016-10-22 DIAGNOSIS — L03116 Cellulitis of left lower limb: Secondary | ICD-10-CM | POA: Diagnosis not present

## 2016-10-22 DIAGNOSIS — T391X1A Poisoning by 4-Aminophenol derivatives, accidental (unintentional), initial encounter: Principal | ICD-10-CM | POA: Diagnosis present

## 2016-10-22 DIAGNOSIS — I110 Hypertensive heart disease with heart failure: Secondary | ICD-10-CM | POA: Diagnosis present

## 2016-10-22 DIAGNOSIS — Z886 Allergy status to analgesic agent status: Secondary | ICD-10-CM

## 2016-10-22 DIAGNOSIS — Z881 Allergy status to other antibiotic agents status: Secondary | ICD-10-CM | POA: Diagnosis not present

## 2016-10-22 DIAGNOSIS — M7989 Other specified soft tissue disorders: Secondary | ICD-10-CM | POA: Diagnosis not present

## 2016-10-22 DIAGNOSIS — M797 Fibromyalgia: Secondary | ICD-10-CM | POA: Diagnosis present

## 2016-10-22 DIAGNOSIS — F17210 Nicotine dependence, cigarettes, uncomplicated: Secondary | ICD-10-CM | POA: Diagnosis present

## 2016-10-22 DIAGNOSIS — T1491XA Suicide attempt, initial encounter: Secondary | ICD-10-CM

## 2016-10-22 DIAGNOSIS — I872 Venous insufficiency (chronic) (peripheral): Secondary | ICD-10-CM | POA: Diagnosis present

## 2016-10-22 DIAGNOSIS — M879 Osteonecrosis, unspecified: Secondary | ICD-10-CM | POA: Diagnosis not present

## 2016-10-22 DIAGNOSIS — T50904A Poisoning by unspecified drugs, medicaments and biological substances, undetermined, initial encounter: Secondary | ICD-10-CM | POA: Diagnosis not present

## 2016-10-22 DIAGNOSIS — R188 Other ascites: Secondary | ICD-10-CM | POA: Diagnosis not present

## 2016-10-22 DIAGNOSIS — Z6839 Body mass index (BMI) 39.0-39.9, adult: Secondary | ICD-10-CM

## 2016-10-22 DIAGNOSIS — S42001D Fracture of unspecified part of right clavicle, subsequent encounter for fracture with routine healing: Secondary | ICD-10-CM

## 2016-10-22 DIAGNOSIS — Z79891 Long term (current) use of opiate analgesic: Secondary | ICD-10-CM

## 2016-10-22 DIAGNOSIS — R296 Repeated falls: Secondary | ICD-10-CM | POA: Diagnosis present

## 2016-10-22 DIAGNOSIS — E039 Hypothyroidism, unspecified: Secondary | ICD-10-CM | POA: Diagnosis present

## 2016-10-22 DIAGNOSIS — R7989 Other specified abnormal findings of blood chemistry: Secondary | ICD-10-CM | POA: Diagnosis present

## 2016-10-22 DIAGNOSIS — X58XXXD Exposure to other specified factors, subsequent encounter: Secondary | ICD-10-CM | POA: Diagnosis present

## 2016-10-22 DIAGNOSIS — R52 Pain, unspecified: Secondary | ICD-10-CM

## 2016-10-22 DIAGNOSIS — I5033 Acute on chronic diastolic (congestive) heart failure: Secondary | ICD-10-CM | POA: Diagnosis present

## 2016-10-22 DIAGNOSIS — R69 Illness, unspecified: Secondary | ICD-10-CM | POA: Diagnosis not present

## 2016-10-22 DIAGNOSIS — Y92410 Unspecified street and highway as the place of occurrence of the external cause: Secondary | ICD-10-CM

## 2016-10-22 DIAGNOSIS — S42001A Fracture of unspecified part of right clavicle, initial encounter for closed fracture: Secondary | ICD-10-CM | POA: Diagnosis not present

## 2016-10-22 DIAGNOSIS — I361 Nonrheumatic tricuspid (valve) insufficiency: Secondary | ICD-10-CM | POA: Diagnosis not present

## 2016-10-22 DIAGNOSIS — F419 Anxiety disorder, unspecified: Secondary | ICD-10-CM | POA: Diagnosis present

## 2016-10-22 DIAGNOSIS — K7290 Hepatic failure, unspecified without coma: Secondary | ICD-10-CM | POA: Diagnosis present

## 2016-10-22 DIAGNOSIS — G8929 Other chronic pain: Secondary | ICD-10-CM | POA: Diagnosis present

## 2016-10-22 DIAGNOSIS — E43 Unspecified severe protein-calorie malnutrition: Secondary | ICD-10-CM | POA: Diagnosis present

## 2016-10-22 DIAGNOSIS — Z9884 Bariatric surgery status: Secondary | ICD-10-CM

## 2016-10-22 DIAGNOSIS — Z9109 Other allergy status, other than to drugs and biological substances: Secondary | ICD-10-CM

## 2016-10-22 DIAGNOSIS — R030 Elevated blood-pressure reading, without diagnosis of hypertension: Secondary | ICD-10-CM | POA: Diagnosis not present

## 2016-10-22 DIAGNOSIS — K219 Gastro-esophageal reflux disease without esophagitis: Secondary | ICD-10-CM | POA: Diagnosis present

## 2016-10-22 DIAGNOSIS — R601 Generalized edema: Secondary | ICD-10-CM | POA: Diagnosis present

## 2016-10-22 DIAGNOSIS — Z79899 Other long term (current) drug therapy: Secondary | ICD-10-CM

## 2016-10-22 DIAGNOSIS — F1210 Cannabis abuse, uncomplicated: Secondary | ICD-10-CM | POA: Diagnosis present

## 2016-10-22 DIAGNOSIS — T39011A Poisoning by aspirin, accidental (unintentional), initial encounter: Secondary | ICD-10-CM | POA: Diagnosis not present

## 2016-10-22 DIAGNOSIS — G92 Toxic encephalopathy: Secondary | ICD-10-CM | POA: Diagnosis not present

## 2016-10-22 DIAGNOSIS — L03115 Cellulitis of right lower limb: Secondary | ICD-10-CM | POA: Diagnosis not present

## 2016-10-22 DIAGNOSIS — T391X1D Poisoning by 4-Aminophenol derivatives, accidental (unintentional), subsequent encounter: Secondary | ICD-10-CM | POA: Diagnosis not present

## 2016-10-22 DIAGNOSIS — M87059 Idiopathic aseptic necrosis of unspecified femur: Secondary | ICD-10-CM | POA: Diagnosis present

## 2016-10-22 LAB — CREATININE, SERUM
Creatinine, Ser: 0.54 mg/dL (ref 0.44–1.00)
GFR calc non Af Amer: >60 mL/min (ref >60)

## 2016-10-22 LAB — CBC WITH DIFFERENTIAL/PLATELET
Basophils Absolute: 0.1 10*3/uL (ref 0.0–0.1)
Basophils Relative: 1 %
Eosinophils Absolute: 0.2 10*3/uL (ref 0.0–0.7)
Eosinophils Relative: 4 %
HEMATOCRIT: 28.8 % — AB (ref 36.0–46.0)
HEMOGLOBIN: 9.8 g/dL — AB (ref 12.0–15.0)
LYMPHS ABS: 2 10*3/uL (ref 0.7–4.0)
LYMPHS PCT: 36 %
MCH: 35.5 pg — AB (ref 26.0–34.0)
MCHC: 34 g/dL (ref 30.0–36.0)
MCV: 104.3 fL — AB (ref 78.0–100.0)
Monocytes Absolute: 0.6 10*3/uL (ref 0.1–1.0)
Monocytes Relative: 10 %
NEUTROS ABS: 2.7 10*3/uL (ref 1.7–7.7)
NEUTROS PCT: 49 %
Platelets: 169 10*3/uL (ref 150–400)
RBC: 2.76 MIL/uL — AB (ref 3.87–5.11)
RDW: 15.3 % (ref 11.5–15.5)
WBC: 5.5 10*3/uL (ref 4.0–10.5)

## 2016-10-22 LAB — COMPREHENSIVE METABOLIC PANEL
ALBUMIN: 1.4 g/dL — AB (ref 3.5–5.0)
ALT: 121 U/L — AB (ref 14–54)
AST: 226 U/L — AB (ref 15–41)
Alkaline Phosphatase: 156 U/L — ABNORMAL HIGH (ref 38–126)
Anion gap: 8 (ref 5–15)
BILIRUBIN TOTAL: 1.1 mg/dL (ref 0.3–1.2)
BUN: 13 mg/dL (ref 6–20)
CO2: 22 mmol/L (ref 22–32)
CREATININE: 0.55 mg/dL (ref 0.44–1.00)
Calcium: 7.6 mg/dL — ABNORMAL LOW (ref 8.9–10.3)
Chloride: 113 mmol/L — ABNORMAL HIGH (ref 101–111)
Glucose, Bld: 76 mg/dL (ref 65–99)
Potassium: 4.1 mmol/L (ref 3.5–5.1)
SODIUM: 143 mmol/L (ref 135–145)
TOTAL PROTEIN: 4.4 g/dL — AB (ref 6.5–8.1)

## 2016-10-22 LAB — RAPID URINE DRUG SCREEN, HOSP PERFORMED
Amphetamines: NOT DETECTED
Barbiturates: NOT DETECTED
Benzodiazepines: NOT DETECTED
Cocaine: NOT DETECTED
Opiates: POSITIVE — AB
Tetrahydrocannabinol: NOT DETECTED

## 2016-10-22 LAB — CBC
HEMATOCRIT: 33.5 % — AB (ref 36.0–46.0)
HEMOGLOBIN: 11.3 g/dL — AB (ref 12.0–15.0)
MCH: 35.4 pg — AB (ref 26.0–34.0)
MCHC: 33.7 g/dL (ref 30.0–36.0)
MCV: 105 fL — AB (ref 78.0–100.0)
PLATELETS: 207 10*3/uL (ref 150–400)
RBC: 3.19 MIL/uL — AB (ref 3.87–5.11)
RDW: 15.2 % (ref 11.5–15.5)
WBC: 6.1 10*3/uL (ref 4.0–10.5)

## 2016-10-22 LAB — PREGNANCY, URINE: PREG TEST UR: NEGATIVE

## 2016-10-22 LAB — PROTIME-INR
INR: 1.07
PROTHROMBIN TIME: 13.9 s (ref 11.4–15.2)

## 2016-10-22 LAB — ETHANOL

## 2016-10-22 LAB — I-STAT BETA HCG BLOOD, ED (MC, WL, AP ONLY): I-stat hCG, quantitative: 5.9 m[IU]/mL — ABNORMAL HIGH (ref <5)

## 2016-10-22 LAB — ACETAMINOPHEN LEVEL: Acetaminophen (Tylenol), Serum: 25 ug/mL (ref 10–30)

## 2016-10-22 LAB — SALICYLATE LEVEL

## 2016-10-22 MED ORDER — VITAMIN B-1 100 MG PO TABS
100.0000 mg | ORAL_TABLET | Freq: Every day | ORAL | Status: DC
  Administered 2016-10-22 – 2016-10-26 (×5): 100 mg via ORAL
  Filled 2016-10-22 (×5): qty 1

## 2016-10-22 MED ORDER — DEXTROSE 5 % IV SOLN
15.0000 mg/kg/h | INTRAVENOUS | Status: DC
  Filled 2016-10-22: qty 200

## 2016-10-22 MED ORDER — SIMETHICONE 80 MG PO CHEW: 80.0000 mg | CHEWABLE_TABLET | Freq: Four times a day (QID) | ORAL | Status: DC | PRN

## 2016-10-22 MED ORDER — FUROSEMIDE 40 MG PO TABS
40.0000 mg | ORAL_TABLET | Freq: Every day | ORAL | Status: DC | PRN
  Filled 2016-10-22: qty 1

## 2016-10-22 MED ORDER — PANTOPRAZOLE SODIUM 40 MG PO TBEC
40.0000 mg | DELAYED_RELEASE_TABLET | Freq: Every day | ORAL | Status: DC
Start: 2016-10-22 — End: 2016-10-26
  Administered 2016-10-22 – 2016-10-26 (×5): 40 mg via ORAL
  Filled 2016-10-22 (×4): qty 1

## 2016-10-22 MED ORDER — DOCUSATE SODIUM 100 MG PO CAPS: 100.0000 mg | ORAL_CAPSULE | Freq: Two times a day (BID) | ORAL | Status: DC | PRN

## 2016-10-22 MED ORDER — LEVOTHYROXINE SODIUM 25 MCG PO TABS
137.0000 ug | ORAL_TABLET | ORAL | Status: DC
  Administered 2016-10-23 – 2016-10-25 (×2): 137 ug via ORAL
  Filled 2016-10-22 (×2): qty 1

## 2016-10-22 MED ORDER — ENOXAPARIN SODIUM 40 MG/0.4ML ~~LOC~~ SOLN
40.0000 mg | SUBCUTANEOUS | Status: DC
  Administered 2016-10-22 – 2016-10-25 (×4): 40 mg via SUBCUTANEOUS
  Filled 2016-10-22 (×4): qty 0.4

## 2016-10-22 MED ORDER — LEVOTHYROXINE SODIUM 125 MCG PO TABS
125.0000 ug | ORAL_TABLET | Freq: Every day | ORAL | Status: DC
Start: 2016-10-22 — End: 2016-10-22

## 2016-10-22 MED ORDER — LEVOTHYROXINE SODIUM 125 MCG PO TABS
125.0000 ug | ORAL_TABLET | ORAL | Status: DC
  Administered 2016-10-22 – 2016-10-26 (×3): 125 ug via ORAL
  Filled 2016-10-22 (×3): qty 1

## 2016-10-22 MED ORDER — TOPIRAMATE 25 MG PO TABS
50.0000 mg | ORAL_TABLET | Freq: Every day | ORAL | Status: DC
  Administered 2016-10-22 – 2016-10-25 (×2): 50 mg via ORAL
  Filled 2016-10-22 (×3): qty 2

## 2016-10-22 MED ORDER — PREGABALIN 75 MG PO CAPS
225.0000 mg | ORAL_CAPSULE | Freq: Two times a day (BID) | ORAL | Status: DC
  Administered 2016-10-22 – 2016-10-23 (×3): 225 mg via ORAL
  Filled 2016-10-22: qty 4
  Filled 2016-10-22 (×2): qty 3

## 2016-10-22 MED ORDER — OXYCODONE HCL 5 MG PO TABS
5.0000 mg | ORAL_TABLET | Freq: Four times a day (QID) | ORAL | Status: DC | PRN
  Administered 2016-10-23: 5 mg via ORAL
  Filled 2016-10-22: qty 1

## 2016-10-22 MED ORDER — SODIUM CHLORIDE 0.9 % IV BOLUS (SEPSIS)
1000.0000 mL | Freq: Once | INTRAVENOUS | Status: AC
Start: 2016-10-22 — End: 2016-10-22
  Administered 2016-10-22: 1000 mL via INTRAVENOUS

## 2016-10-22 MED ORDER — NORTRIPTYLINE HCL 25 MG PO CAPS
75.0000 mg | ORAL_CAPSULE | Freq: Two times a day (BID) | ORAL | Status: DC
  Administered 2016-10-23: 75 mg via ORAL
  Filled 2016-10-22 (×2): qty 3

## 2016-10-22 MED ORDER — ACETAMINOPHEN 325 MG PO TABS: 650.0000 mg | ORAL_TABLET | Freq: Four times a day (QID) | ORAL | Status: DC | PRN

## 2016-10-22 MED ORDER — SODIUM CHLORIDE 0.9 % IV SOLN: 250.0000 mL | INTRAVENOUS | Status: DC | PRN

## 2016-10-22 MED ORDER — METOLAZONE 5 MG PO TABS
5.0000 mg | ORAL_TABLET | Freq: Every day | ORAL | Status: DC | PRN
  Filled 2016-10-22: qty 1

## 2016-10-22 MED ORDER — ACETYLCYSTEINE LOAD VIA INFUSION
150.0000 mg/kg | Freq: Once | INTRAVENOUS | Status: AC
  Administered 2016-10-22: 12315 mg via INTRAVENOUS
  Filled 2016-10-22: qty 308

## 2016-10-22 MED ORDER — SODIUM CHLORIDE 0.9% FLUSH: 3.0000 mL | INTRAVENOUS | Status: DC | PRN

## 2016-10-22 MED ORDER — SODIUM CHLORIDE 0.9% FLUSH
3.0000 mL | Freq: Two times a day (BID) | INTRAVENOUS | Status: DC
  Administered 2016-10-24 – 2016-10-25 (×2): 3 mL via INTRAVENOUS

## 2016-10-22 MED ORDER — ALPRAZOLAM 0.5 MG PO TABS: 0.5000 mg | ORAL_TABLET | Freq: Three times a day (TID) | ORAL | Status: DC | PRN

## 2016-10-22 MED ORDER — POTASSIUM CHLORIDE CRYS ER 20 MEQ PO TBCR: 20.0000 meq | EXTENDED_RELEASE_TABLET | Freq: Two times a day (BID) | ORAL | Status: DC | PRN
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
## 2016-10-22 NOTE — Progress Notes (Signed)
Report called and given to Ginger, RN on the 5th floor. Pt to be transferred momentarily. Pt made aware of transfer.

## 2016-10-22 NOTE — ED Notes (Signed)
[facility], pt to go to [facility]
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
## 2016-10-22 NOTE — Progress Notes (Signed)
Pt transferred to 1516. Left unit in bed pushed by sitter and this Clinical research associatewriter. Left in stable condition. Patient's belongings sent with patient.

## 2016-10-22 NOTE — BH Assessment (Signed)
Assessment Note  Dorothy Dyer is an 41 y.o. female. She presents to [facility] on this day BIB by [employer]. Patient IVC'd by [facility] officer. The IVC reads, "Patient [place] side of the [address] for 2.5 hours. Police responded to scene and found respondent had taken 30 or more Hydrocodone pills. Respondent was asked about family and she state that her family doesn't care about her.   Writer met with patient face to face. She explains that last night she went to [employer] to get food. States that on her way to [place] she ran out of gas and had a flat tire. She did not want to call family because she felt that she would be bothering them. She states, "I know my brother has a to get up early so I especially didn't want to bother him. She thought if she sat in her car someone would stop to help her. She admits that she took a Vicodin while waiting. She reports having multiple medical issues that result in chronic pain. Patient therefore took 1 Vicodin. She adamantly denies taking 30 pills. She says that she had remnants of the pill on her mouth because she didn't have water to wash it down. Patient admits that she sometimes take more than she should because of her pain but never to hurt herself. She denies SI, HI, and AVH's. She denies having a psychiatric history. No prior attempts to harm herself. No self mutilating behaviors. She does admit to depressive symptoms including fatigue, crying spells, and hopelessness. She has stressors which include her medical issues, problems related to completing ADL's, and caring for her 70 yr son who has Autism. She denies drug use. States that she occasionally drinks alcohol. She does not have a psychiatrist or therapist. No history of INPT mental health treatment.    Diagnosis: Major Depressive Disorder, Recurrent, Severe, without psychotic features  Past Medical History:  Past Medical History:  Diagnosis Date  . Abdominal pain, chronic, generalized   . Acid reflux    . Anemia   . Anxiety   . Arthritis   . 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 Clotting disorder ([place])    undetermined  . Cystitis   . 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 Hypertension   . Neuromuscular disorder ([place])   . Osteoporosis   . Psoriasis   . Thyroid disease     Past Surgical History:  Procedure Laterality Date  . CHOLECYSTECTOMY    . ESOPHAGOGASTRODUODENOSCOPY  10/2014   at [facility], was normal. Dr. Roney Mans  . GASTRIC BYPASS  05/2001  . SMALL INTESTINE SURGERY  2004   exploratory    Family History:  Family History  Problem Relation Age of Onset  . Depression Mother   . Diabetes Mother   . Hyperlipidemia Mother   . Depression Father   . Hearing loss Father   . Hyperlipidemia Father   . Hypertension Father   . Learning disabilities Father   . Mental illness Father   . Asthma Brother   . Alcohol abuse Maternal Grandmother   . Arthritis Maternal Grandmother   . Cancer Maternal Grandmother   . Cancer Maternal Grandfather   . Mental illness Paternal Grandmother   . Heart disease Paternal Grandfather   . Stroke Paternal Grandfather     Social History:  reports that she has been smoking Cigarettes.  She has been smoking about 0.50 packs per day. She has never used smokeless tobacco.  She reports that she drinks about 6.0 oz of alcohol per week . She reports that she does not use drugs.  Additional Social History:  Alcohol / Drug Use Pain Medications: SEE MAR Prescriptions: SEE MAR Over the Counter: SEE MAR History of alcohol / drug use?: Yes Substance #1 Name of Substance 1: Alcohol  1 - Age of First Use: "I can't remember" 1 - Amount (size/oz): "I don't know how to measure it" 1 - Frequency: 1x every 3 weeks 1 - Duration: on-going  1 - Last Use / Amount: "It's been a while"  CIWA: CIWA-Ar BP: (!) 133/96 Pulse Rate: 81 COWS:    Allergies:  Allergies  Allergen Reactions  . Ibuprofen Nausea Only and Other (See Comments)    Bad stomach pains  .  Toradol [Ketorolac Tromethamine] Anxiety  . Tylenol [Acetaminophen]   . Aspirin Hives  . Cleocin [Clindamycin Hcl] Itching and Rash    Home Medications:  (Not in a hospital admission)  OB/GYN Status:  No LMP recorded (lmp unknown). Patient is not currently having periods (Reason: Perimenopausal).  General Assessment Data Location of Assessment: [facility] TTS Assessment: In system Is this a Tele or Face-to-Face Assessment?: Face-to-Face Is this an Initial Assessment or a Re-assessment for this encounter?: Initial Assessment Marital status: Single Maiden name:  (n/a) Is patient pregnant?: No Pregnancy Status: No Living Arrangements: Children Can pt return to current living arrangement?: Yes Admission Status: Involuntary Is patient capable of signing voluntary admission?: Yes Referral Source: Self/Family/Friend Insurance type:  ([employer]/Medicare )     Crisis Care Plan Living Arrangements: Children Legal Guardian: Other: (no legal guardian ) Name of Psychiatrist:  (no psychiatrist ) Name of Therapist:  (no therapist )  Education Status Is patient currently in school?: No Current Grade:  (n/a) Highest grade of school patient has completed:  (n/a) Name of school:  (n/a) Contact person:  (n/a)  Risk to self with the past 6 months Suicidal Ideation: No Has patient been a risk to self within the past 6 months prior to admission? : No Suicidal Intent: No Has patient had any suicidal intent within the past 6 months prior to admission? : No Is patient at risk for suicide?: No Suicidal Plan?: No Has patient had any suicidal plan within the past 6 months prior to admission? : No Access to Means: No What has been your use of drugs/alcohol within the last 12 months?:  (occasional alcohol use ) Previous Attempts/Gestures: No How many times?:  (0) Other Self Harm Risks:  (denies self harm ) Triggers for Past Attempts: Other (Comment) (no past attempts or gestures ) Intentional Self  Injurious Behavior: None Family Suicide History: No Recent stressful life event(s): Other (Comment) (medical issues, difficulty with ALF, son with autism) Persecutory voices/beliefs?: No Depression: Yes Depression Symptoms: Loss of interest in usual pleasures, Fatigue, Tearfulness Substance abuse history and/or treatment for substance abuse?: No Suicide prevention information given to non-admitted patients: Not applicable  Risk to Others within the past 6 months Homicidal Ideation: No Does patient have any lifetime risk of violence toward others beyond the six months prior to admission? : No Thoughts of Harm to Others: No Current Homicidal Intent: No Current Homicidal Plan: No Access to Homicidal Means: No Identified Victim:  (n/a) History of harm to others?: No Assessment of Violence: None Noted Violent Behavior Description:  (patient is calm and cooperative ) Does patient have access to weapons?: No Criminal Charges Pending?: No Does patient have a court date: No Is  patient on probation?: No  Psychosis Hallucinations: None noted Delusions: None noted  Mental Status Report Appearance/Hygiene: In hospital gown, Disheveled Eye Contact: Good Motor Activity: Freedom of movement Speech: Logical/coherent Level of Consciousness: Alert Mood: Depressed Affect: Appropriate to circumstance Anxiety Level: None Thought Processes: Coherent, Relevant Judgement: Impaired Orientation: Person, Situation, Time, Place Obsessive Compulsive Thoughts/Behaviors: None  Cognitive Functioning Concentration: Decreased Memory: Recent Intact, Remote Intact IQ: Average Insight: Fair Impulse Control: Fair Appetite: Poor Weight Loss:  (none reported) Weight Gain:  (none reported) Sleep: Decreased Total Hours of Sleep:  (varies ) Vegetative Symptoms: None  ADLScreening [facility] Assessment Services) Patient's cognitive ability adequate to safely complete daily activities?: Yes Patient able to  express need for assistance with ADLs?: Yes Independently performs ADLs?: Yes (appropriate for developmental age)  Prior Inpatient Therapy Prior Inpatient Therapy: No Prior Therapy Dates:  (n/a) Prior Therapy Facilty/Provider(s):  (n/a) Reason for Treatment:  (n/a)  Prior Outpatient Therapy Prior Outpatient Therapy: No Prior Therapy Dates:  (n/a) Prior Therapy Facilty/Provider(s):  (n/a) Reason for Treatment:  (n/a) Does patient have an ACCT team?: No Does patient have Intensive In-House Services?  : No Does patient have [facility] services? : No Does patient have P4CC services?: No  ADL Screening (condition at time of admission) Patient's cognitive ability adequate to safely complete daily activities?: Yes Is the patient deaf or have difficulty hearing?: No Does the patient have difficulty seeing, even when wearing glasses/contacts?: No Does the patient have difficulty concentrating, remembering, or making decisions?: No Patient able to express need for assistance with ADLs?: Yes Does the patient have difficulty dressing or bathing?: No Independently performs ADLs?: Yes (appropriate for developmental age) Does the patient have difficulty walking or climbing stairs?: No Weakness of Legs: Both Weakness of Arms/Hands: None  Home Assistive Devices/Equipment Home Assistive Devices/Equipment: None    Abuse/Neglect Assessment (Assessment to be complete while patient is alone) Physical Abuse: Denies Verbal Abuse: Denies Sexual Abuse: Denies Exploitation of patient/patient's resources: Denies Self-Neglect: Denies Values / Beliefs Cultural Requests During Hospitalization: None Spiritual Requests During Hospitalization: None   Advance Directives (For Healthcare) Does Patient Have a Medical Advance Directive?: No Would patient like information on creating a medical advance directive?: Yes (Inpatient - patient requests chaplain consult to create a medical advance directive) Nutrition  Screen- [facility] Adult/[facility] Patient's home diet: Regular  Additional Information CIRT Risk: No Elopement Risk: No Does patient have medical clearance?: Yes     Disposition:  Disposition Initial Assessment Completed for this Encounter: Yes Disposition of Patient: Other dispositions (Per Waylan Boga, DNP, patient to remain in the ED overnight) Other disposition(s): Other (Comment) (Pending am psych evaluation)  On Site Evaluation by:   Reviewed with Physician:    Waldon Merl 10/22/2016 2:03 PM

## 2016-10-22 NOTE — ED Notes (Signed)
Call received from MellenJeana at poison control center requesting an update on patient. After reviewing labs, caller asked that pt be placed on oral mucomyst and that communication for MD would be faxed by her later.

## 2016-10-22 NOTE — ED Notes (Signed)
Pt received in room 27. Alert and  Oriented and limited in mobility. Pt assisted to bed by EMT and sitter and made comfortable.  Sitting up in bed eating a sandwich. Derinda SisVera Chibueze Beasley,rn.

## 2016-10-22 NOTE — ED Provider Notes (Signed)
[facility]-EMERGENCY DEPT Provider Note   CSN: [id number] Arrival date & time: 10/22/16  4098     History   Chief Complaint Chief Complaint  Patient presents with  . Ingestion    HPI Dorothy Dyer is a 41 y.o. female.  41 yo F with a chief complaint of an overdose. Per EMS the patient was found in a car on the side of the road that was estimated been there for at least 2 hours. Please investigated and she was found to have a mouthful of pain pills. She had 41 left out of about 80 5/325 Percocets. The patient denies this ever happened. Denies suicidal ideation. She states that there is a blank time since yesterday. She has been drinking vodka and taking the pain pills due to chronic pain to her left hip.   The history is provided by the patient.  Ingestion  Pertinent negatives include no chest pain, no headaches and no shortness of breath.  Illness  This is a new problem. The current episode started 2 days ago. The problem occurs constantly. The problem has not changed since onset.Pertinent negatives include no chest pain, no headaches and no shortness of breath. Nothing aggravates the symptoms. Nothing relieves the symptoms. She has tried nothing for the symptoms. The treatment provided no relief.    Past Medical History:  Diagnosis Date  . Abdominal pain, chronic, generalized   . Acid reflux   . Anemia   . Anxiety   . Arthritis   . AVN of femur (HCC)    left hip  . Clotting disorder (HCC)    undetermined  . Cystitis   . 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 Hypertension   . Neuromuscular disorder (HCC)   . Osteoporosis   . Psoriasis   . Thyroid disease     Patient Active Problem List   Diagnosis Date Noted  . Pressure injury of skin 10/15/2016  . Bilateral lower leg cellulitis 10/14/2016  . Leg swelling 10/14/2016  . Pain 09/27/2016  . Clavicular fracture 09/27/2016  . Abdominal pain, chronic, generalized   . Protein-calorie malnutrition, severe  06/02/2016  . Acute encephalopathy 06/01/2016  . Chronic left hip pain 06/01/2016  . Overdose of opiate or related narcotic, accidental or unintentional, initial encounter 06/01/2016  . Lower extremity cellulitis 06/01/2016  . 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 Left knee pain 06/01/2016  . Bilateral lower extremity edema 06/01/2016  . Acute hypokalemia 02/11/2016  . Hypoglycemia 02/11/2016  . Marijuana abuse 02/11/2016  . Chronic narcotic use 02/11/2016  . AKI (acute kidney injury) (HCC) 02/10/2016  . AVN of femur (HCC)   . 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 Hoarseness 06/17/2014  . 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 Fibromyalgia 06/17/2014  . Chronic abdominal pain 06/17/2014  . Knee pain, bilateral 06/17/2014  . Vitamin D deficiency 06/17/2014  . Vitamin B12 deficiency 06/17/2014  . Iron deficiency anemia 06/17/2014  . Thyroid activity decreased 06/17/2014  . Abnormal Pap smear of cervix 06/17/2014  . Anemia   . Anxiety   . Arthritis   . Acid reflux   . Neuromuscular disorder (HCC)   . Hypertension   . Osteoporosis   . HPV (human papilloma virus) infection     Past Surgical History:  Procedure Laterality Date  . CHOLECYSTECTOMY    . ESOPHAGOGASTRODUODENOSCOPY  10/2014   at [facility], was normal. Dr. Merri Brunette  . GASTRIC BYPASS  05/2001  . SMALL INTESTINE SURGERY  2004   exploratory  OB History    No data available       Home Medications    Prior to Admission medications   Medication Sig Start Date End Date Taking? Authorizing Provider  ALPRAZolam Prudy Feeler(XANAX) 0.5 MG tablet Take 1 tablet (0.5 mg total) by mouth 3 (three) times daily as needed for anxiety. 09/29/16  Yes Auburn BilberryPatel, Shreyang, MD  HYDROcodone-acetaminophen (NORCO) 10-325 MG tablet Take 1 tablet by mouth 3 (three) times daily as needed. 10/19/16  Yes [provider]  naproxen sodium (ANAPROX) 220 MG tablet Take 1,320 mg by mouth 2 (two) times daily with a meal.    Yes [provider]    acetaminophen (TYLENOL) 325 MG tablet Take 2 tablets (650 mg total) by mouth every 6 (six) hours as needed for mild pain (or Fever >/= 101). Patient not taking: Reported on 10/22/2016 06/03/16   Elease EtienneHongalgi, Anand D, MD  B Complex-C (B-COMPLEX WITH VITAMIN C) tablet Take 1 tablet by mouth daily. Patient not taking: Reported on 09/27/2016 06/04/16   Elease EtienneHongalgi, Anand D, MD  calcium-vitamin D (OSCAL WITH D) 500-200 MG-UNIT tablet Take 2 tablets by mouth daily with breakfast. Patient not taking: Reported on 09/27/2016 06/04/16   Elease EtienneHongalgi, Anand D, MD  cephALEXin (KEFLEX) 250 MG capsule Take 1 capsule (250 mg total) by mouth 2 (two) times daily. 10/16/16 10/26/16  Delfino LovettShah, Vipul, MD  docusate sodium (COLACE) 100 MG capsule Take 1 capsule (100 mg total) by mouth 2 (two) times daily as needed for mild constipation. 09/29/16   Auburn BilberryPatel, Shreyang, MD  furosemide (LASIX) 40 MG tablet Take 1 tablet (40 mg total) by mouth daily as needed. Patient taking differently: Take 40 mg by mouth daily as needed for fluid.  03/31/16   Donita BrooksPickard, Warren T, MD  levothyroxine (SYNTHROID, LEVOTHROID) 125 MCG tablet TAKE 1 TABLET EVERY DAY Patient taking differently: Take 125 mcg by mouth every other day, alternating with 137 mcg tablet. 11/25/15   Donita BrooksPickard, Warren T, MD  levothyroxine (SYNTHROID, LEVOTHROID) 137 MCG tablet Take 137 mcg by mouth every other day. Alternating with 125 mcg tablet.    [provider]  LYRICA 225 MG capsule TAKE 1 CAPSULE BY MOUTH TWICE DAILY 10/03/16   Donita BrooksPickard, Warren T, MD  Magnesium Oxide 400 MG CAPS Take 1 capsule (400 mg total) by mouth 2 (two) times daily. 09/29/16   Auburn BilberryPatel, Shreyang, MD  metolazone (ZAROXOLYN) 5 MG tablet TAKE 1 TABLET BY MOUTH EVERY DAY 30 MINS BEFORE LASIX 1-2 TIMES PER WEEK. 09/05/16   Donita BrooksPickard, Warren T, MD  Multiple Vitamin (MULTIVITAMIN WITH MINERALS) TABS tablet Take 1 tablet by mouth 2 (two) times daily. 06/04/16   Hongalgi, Maximino GreenlandAnand D, MD  nortriptyline (PAMELOR) 75 MG capsule TAKE ONE  CAPSULE BY MOUTH EVERY EVENING AT BEDTIME 06/26/16   Donita BrooksPickard, Warren T, MD  omega-3 acid ethyl esters (LOVAZA) 1 g capsule Take 1 capsule (1 g total) by mouth daily. 06/04/16   Hongalgi, Maximino GreenlandAnand D, MD  oxyCODONE-acetaminophen (PERCOCET/ROXICET) 5-325 MG tablet Take 1 tablet by mouth every 6 (six) hours as needed for severe pain. 09/29/16   Auburn BilberryPatel, Shreyang, MD  potassium chloride SA (K-DUR,KLOR-CON) 20 MEQ tablet Take 1 tablet (20 mEq total) by mouth 2 (two) times daily as needed (when taking lasix). Patient taking differently: Take 20 mEq by mouth daily.  06/03/16   Hongalgi, Maximino GreenlandAnand D, MD  protein supplement (UNJURY CHICKEN SOUP) POWD Take 27 g (8 oz total) by mouth 2 (two) times daily after a meal. Patient not taking: Reported  on 09/27/2016 06/04/16   Elease Etienne, MD  RABEprazole (ACIPHEX) 20 MG tablet 1 tab po bid Patient taking differently: Take 20 mg by mouth daily.  06/29/16   Donita Brooks, MD  simethicone (MYLICON) 80 MG chewable tablet Chew 1 tablet (80 mg total) by mouth 4 (four) times daily as needed for flatulence. 09/29/16   Auburn Bilberry, MD  thiamine 100 MG tablet Take 1 tablet (100 mg total) by mouth daily. 06/04/16   Hongalgi, Maximino Greenland, MD  topiramate (TOPAMAX) 50 MG tablet TAKE 1 TABLET BY MOUTH AT BEDTIME Patient taking differently: TAKE 50mg  TABLET BY MOUTH AT BEDTIME 01/31/16   Donita Brooks, MD  traMADol (ULTRAM) 50 MG tablet Take 1 tablet (50 mg total) by mouth every 6 (six) hours as needed for moderate pain or severe pain. 06/03/16   Hongalgi, Maximino Greenland, MD    Family History Family History  Problem Relation Age of Onset  . Depression Mother   . Diabetes Mother   . Hyperlipidemia Mother   . Depression Father   . Hearing loss Father   . Hyperlipidemia Father   . Hypertension Father   . Learning disabilities Father   . Mental illness Father   . Asthma Brother   . Alcohol abuse Maternal Grandmother   . Arthritis Maternal Grandmother   . Cancer Maternal Grandmother     . Cancer Maternal Grandfather   . Mental illness Paternal Grandmother   . Heart disease Paternal Grandfather   . Stroke Paternal Grandfather     Social History Social History  Substance Use Topics  . Smoking status: Current Every Day Smoker    Packs/day: 0.50    Types: Cigarettes  . Smokeless tobacco: Never Used  . Alcohol use 6.0 oz/week    10 Shots of liquor per week     Comment: once a week     Allergies   Ibuprofen; Toradol [ketorolac tromethamine]; Tylenol [acetaminophen]; Aspirin; and Cleocin [clindamycin hcl]   Review of Systems Review of Systems  Constitutional: Negative for chills and fever.  HENT: Negative for congestion and rhinorrhea.   Eyes: Negative for redness and visual disturbance.  Respiratory: Negative for shortness of breath and wheezing.   Cardiovascular: Negative for chest pain and palpitations.  Gastrointestinal: Negative for nausea and vomiting.  Genitourinary: Negative for dysuria and urgency.  Musculoskeletal: Negative for arthralgias and myalgias.  Skin: Negative for pallor and wound.  Neurological: Negative for dizziness and headaches.     Physical Exam Updated Vital Signs BP (!) 133/96 (BP Location: Right Arm)   Pulse 81   Temp (!) 97.5 F (36.4 C) (Oral)   Resp 12   LMP  (LMP Unknown)   SpO2 99%   Physical Exam  Constitutional: She is oriented to person, place, and time. She appears well-developed and well-nourished. No distress.  HENT:  Head: Normocephalic and atraumatic.  Eyes: Pupils are equal, round, and reactive to light. EOM are normal.  Neck: Normal range of motion. Neck supple.  Cardiovascular: Normal rate and regular rhythm.  Exam reveals no gallop and no friction rub.   No murmur heard. Pulmonary/Chest: Effort normal. She has no wheezes. She has no rales.  Abdominal: Soft. She exhibits no distension. There is no tenderness.  Musculoskeletal: She exhibits no edema or tenderness.  Neurological: She is alert and  oriented to person, place, and time.  Skin: Skin is warm and dry. She is not diaphoretic.  Psychiatric: She has a normal mood and affect. Her behavior  is normal.  Nursing note and vitals reviewed.    ED Treatments / Results  Labs (all labs ordered are listed, but only abnormal results are displayed) Labs Reviewed  COMPREHENSIVE METABOLIC PANEL - Abnormal; Notable for the following:       Result Value   Chloride 113 (*)    Calcium 7.6 (*)    Total Protein 4.4 (*)    Albumin 1.4 (*)    AST 226 (*)    ALT 121 (*)    Alkaline Phosphatase 156 (*)    All other components within normal limits  RAPID URINE DRUG SCREEN, HOSP PERFORMED - Abnormal; Notable for the following:    Opiates POSITIVE (*)    All other components within normal limits  CBC WITH DIFFERENTIAL/PLATELET - Abnormal; Notable for the following:    RBC 2.76 (*)    Hemoglobin 9.8 (*)    HCT 28.8 (*)    MCV 104.3 (*)    MCH 35.5 (*)    All other components within normal limits  I-STAT BETA HCG BLOOD, ED ([facility], [facility], [facility] ONLY) - Abnormal; Notable for the following:    I-stat hCG, quantitative 5.9 (*)    All other components within normal limits  ETHANOL  ACETAMINOPHEN LEVEL  SALICYLATE LEVEL  PREGNANCY, URINE  PROTIME-INR    EKG  EKG Interpretation  Date/Time:  Sunday October 22 2016 10:02:44 EDT Ventricular Rate:  89 PR Interval:    QRS Duration: 95 QT Interval:  368 QTC Calculation: 446 R Axis:   40 Text Interpretation:  Sinus rhythm new low amplitude ecg ? new LBBB difficult to assess t waves no noted qrs widening Otherwise no significant change Confirmed by Melene Plan [phone]) on 10/22/2016 10:56:40 AM       Radiology No results found.  Procedures Procedures (including critical care time)  Medications Ordered in ED Medications  acetaminophen (TYLENOL) tablet 650 mg (not administered)  ALPRAZolam (XANAX) tablet 0.5 mg (not administered)  docusate sodium (COLACE) capsule 100 mg (not administered)   furosemide (LASIX) tablet 40 mg (not administered)  levothyroxine (SYNTHROID, LEVOTHROID) tablet 125 mcg (not administered)  pregabalin (LYRICA) capsule 225 mg (225 mg Oral Given 10/22/16 1408)  metolazone (ZAROXOLYN) tablet 5 mg (not administered)  nortriptyline (PAMELOR) capsule 75 mg (not administered)  topiramate (TOPAMAX) tablet 50 mg (not administered)  acetylcysteine (ACETADOTE) 40 mg/mL load via infusion 12,315 mg (not administered)    Followed by  acetylcysteine (ACETADOTE) 40,000 mg in dextrose 5 % 1,000 mL (40 mg/mL) infusion (not administered)  sodium chloride 0.9 % bolus 1,000 mL (0 mLs Intravenous Stopped 10/22/16 1140)  sodium chloride 0.9 % bolus 1,000 mL (1,000 mLs Intravenous New Bag/Given 10/22/16 1140)     Initial Impression / Assessment and Plan / ED Course  I have reviewed the triage vital signs and the nursing notes.  Pertinent labs & imaging results that were available during my care of the patient were reviewed by me and considered in my medical decision making (see chart for details).  Clinical Course as of Oct 23 1518  Sun Oct 22, 2016  1105 Initial hCG is very minimally positive. Urine pregnancy negative.  [DF]    Clinical Course User Index [DF] Melene Plan, DO    42 yo F with a chief complaint of a drug overdose.i'm concerned that this was a suicide attempt and the patient denies it currently.We'll discuss with poison control.  Poison control recommended 4 hours of onset the ED. The patient's Tylenol level was negative  she is not pregnant. Her lab shows mild LFT elevation.  Poison control at this point is concerned for a delayed tylenol overdose and recommends starting NAC and admission, INR ordered.   CRITICAL CARE Performed by: Rae Roam   Total critical care time: 35 minutes  Critical care time was exclusive of separately billable procedures and treating other patients.  Critical care was necessary to treat or prevent imminent or  life-threatening deterioration.  Critical care was time spent personally by me on the following activities: development of treatment plan with patient and/or surrogate as well as nursing, discussions with consultants, evaluation of patient's response to treatment, examination of patient, obtaining history from patient or surrogate, ordering and performing treatments and interventions, ordering and review of laboratory studies, ordering and review of radiographic studies, pulse oximetry and re-evaluation of patient's condition.   The patients results and plan were reviewed and discussed.   Any x-rays performed were independently reviewed by myself.   Differential diagnosis were considered with the presenting HPI.  Medications  acetaminophen (TYLENOL) tablet 650 mg (not administered)  ALPRAZolam (XANAX) tablet 0.5 mg (not administered)  docusate sodium (COLACE) capsule 100 mg (not administered)  furosemide (LASIX) tablet 40 mg (not administered)  levothyroxine (SYNTHROID, LEVOTHROID) tablet 125 mcg (not administered)  pregabalin (LYRICA) capsule 225 mg (225 mg Oral Given 10/22/16 1408)  metolazone (ZAROXOLYN) tablet 5 mg (not administered)  nortriptyline (PAMELOR) capsule 75 mg (not administered)  topiramate (TOPAMAX) tablet 50 mg (not administered)  acetylcysteine (ACETADOTE) 40 mg/mL load via infusion 12,315 mg (not administered)    Followed by  acetylcysteine (ACETADOTE) 40,000 mg in dextrose 5 % 1,000 mL (40 mg/mL) infusion (not administered)  sodium chloride 0.9 % bolus 1,000 mL (0 mLs Intravenous Stopped 10/22/16 1140)  sodium chloride 0.9 % bolus 1,000 mL (1,000 mLs Intravenous New Bag/Given 10/22/16 1140)    Vitals:   10/22/16 1121 10/22/16 1141 10/22/16 1230 10/22/16 1311  BP: 111/90 107/72 119/87 (!) 133/96  Pulse: 72 76 83 81  Resp: 11 12 11 12   Temp:    (!) 97.5 F (36.4 C)  TempSrc:    Oral  SpO2: 100% 100% 100% 99%    Final diagnoses:  Suicide attempt (HCC)   Intentional acetaminophen overdose, initial encounter [facility])     Final Clinical Impressions(s) / ED Diagnoses   Final diagnoses:  Suicide attempt [facility])  Intentional acetaminophen overdose, initial encounter [facility])    New Prescriptions New Prescriptions   No medications on file     Melene Plan, DO 10/22/16 1520

## 2016-10-23 ENCOUNTER — Inpatient Hospital Stay (HOSPITAL_COMMUNITY): Payer: Medicare HMO

## 2016-10-23 ENCOUNTER — Encounter (HOSPITAL_COMMUNITY): Payer: Self-pay | Admitting: Radiology

## 2016-10-23 DIAGNOSIS — R600 Localized edema: Secondary | ICD-10-CM

## 2016-10-23 LAB — HCG, QUANTITATIVE, PREGNANCY: HCG, BETA CHAIN, QUANT, S: 3 m[IU]/mL (ref <5)

## 2016-10-23 LAB — COMPREHENSIVE METABOLIC PANEL
ALK PHOS: 126 U/L (ref 38–126)
ALT: 90 U/L — ABNORMAL HIGH (ref 14–54)
ALT: 72 U/L — ABNORMAL HIGH (ref 14–54)
ANION GAP: 9 (ref 5–15)
ANION GAP: 6 (ref 5–15)
AST: 92 U/L — AB (ref 15–41)
AST: 61 U/L — ABNORMAL HIGH (ref 15–41)
Albumin: 1.3 g/dL — ABNORMAL LOW (ref 3.5–5.0)
Albumin: 1.1 g/dL — ABNORMAL LOW (ref 3.5–5.0)
Alkaline Phosphatase: 138 U/L — ABNORMAL HIGH (ref 38–126)
BILIRUBIN TOTAL: 0.9 mg/dL (ref 0.3–1.2)
BUN: 10 mg/dL (ref 6–20)
BUN: 9 mg/dL (ref 6–20)
CALCIUM: 7.3 mg/dL — AB (ref 8.9–10.3)
CHLORIDE: 114 mmol/L — AB (ref 101–111)
CO2: 21 mmol/L — ABNORMAL LOW (ref 22–32)
CO2: 22 mmol/L (ref 22–32)
Calcium: 7.5 mg/dL — ABNORMAL LOW (ref 8.9–10.3)
Chloride: 118 mmol/L — ABNORMAL HIGH (ref 101–111)
Creatinine, Ser: 0.63 mg/dL (ref 0.44–1.00)
Creatinine, Ser: 0.53 mg/dL (ref 0.44–1.00)
GFR calc Af Amer: >60 mL/min (ref >60)
GLUCOSE: 89 mg/dL (ref 65–99)
Glucose, Bld: 67 mg/dL (ref 65–99)
POTASSIUM: 3.5 mmol/L (ref 3.5–5.1)
POTASSIUM: 3.2 mmol/L — AB (ref 3.5–5.1)
Sodium: 144 mmol/L (ref 135–145)
Sodium: 146 mmol/L — ABNORMAL HIGH (ref 135–145)
TOTAL PROTEIN: 3.9 g/dL — AB (ref 6.5–8.1)
TOTAL PROTEIN: 3.3 g/dL — AB (ref 6.5–8.1)
Total Bilirubin: 0.9 mg/dL (ref 0.3–1.2)

## 2016-10-23 LAB — MAGNESIUM: Magnesium: 1.6 mg/dL — ABNORMAL LOW (ref 1.7–2.4)

## 2016-10-23 LAB — CBC
HCT: 27.2 % — ABNORMAL LOW (ref 36.0–46.0)
Hemoglobin: 9.3 g/dL — ABNORMAL LOW (ref 12.0–15.0)
MCH: 35.1 pg — ABNORMAL HIGH (ref 26.0–34.0)
MCHC: 34.2 g/dL (ref 30.0–36.0)
MCV: 102.6 fL — AB (ref 78.0–100.0)
PLATELETS: 192 10*3/uL (ref 150–400)
RBC: 2.65 MIL/uL — AB (ref 3.87–5.11)
RDW: 15.4 % (ref 11.5–15.5)
WBC: 7.4 10*3/uL (ref 4.0–10.5)

## 2016-10-23 LAB — ALT: ALT: 71 U/L — AB (ref 14–54)

## 2016-10-23 LAB — PROTIME-INR
INR: 1.27
INR: 1.29
PROTHROMBIN TIME: 16 s — AB (ref 11.4–15.2)
PROTHROMBIN TIME: 16.2 s — AB (ref 11.4–15.2)

## 2016-10-23 LAB — APTT: APTT: 36 s (ref 24–36)

## 2016-10-23 LAB — AMMONIA: Ammonia: 134 umol/L — ABNORMAL HIGH (ref 9–35)

## 2016-10-23 LAB — ACETAMINOPHEN LEVEL: Acetaminophen (Tylenol), Serum: <10 ug/mL — ABNORMAL LOW (ref 10–30)

## 2016-10-23 LAB — AST: AST: 62 U/L — AB (ref 15–41)

## 2016-10-23 LAB — PHOSPHORUS: Phosphorus: 2.9 mg/dL (ref 2.5–4.6)

## 2016-10-23 MED ORDER — VANCOMYCIN HCL 10 G IV SOLR
1250.0000 mg | Freq: Two times a day (BID) | INTRAVENOUS | Status: DC
  Administered 2016-10-24 – 2016-10-26 (×5): 1250 mg via INTRAVENOUS
  Filled 2016-10-23 (×6): qty 1250

## 2016-10-23 MED ORDER — MAGNESIUM SULFATE 2 GM/50ML IV SOLN
2.0000 g | Freq: Once | INTRAVENOUS | Status: AC
  Administered 2016-10-24: 2 g via INTRAVENOUS
  Filled 2016-10-23: qty 50

## 2016-10-23 MED ORDER — VANCOMYCIN HCL 10 G IV SOLR
1500.0000 mg | Freq: Once | INTRAVENOUS | Status: AC
  Administered 2016-10-23: 1500 mg via INTRAVENOUS
  Filled 2016-10-23: qty 1500

## 2016-10-23 MED ORDER — LACTULOSE 10 GM/15ML PO SOLN
30.0000 g | Freq: Three times a day (TID) | ORAL | Status: DC
  Administered 2016-10-23 – 2016-10-24 (×3): 30 g via ORAL
  Filled 2016-10-23 (×3): qty 45

## 2016-10-23 MED ORDER — IOPAMIDOL (ISOVUE-300) INJECTION 61%
INTRAVENOUS | Status: AC
  Filled 2016-10-23: qty 100

## 2016-10-23 MED ORDER — PREMIER PROTEIN SHAKE
11.0000 [oz_av] | Freq: Three times a day (TID) | ORAL | Status: DC
  Administered 2016-10-24 – 2016-10-25 (×6): 11 [oz_av] via ORAL
  Filled 2016-10-23 (×9): qty 325.31

## 2016-10-23 MED ORDER — IOPAMIDOL (ISOVUE-300) INJECTION 61%
100.0000 mL | Freq: Once | INTRAVENOUS | Status: AC | PRN
  Administered 2016-10-23: 100 mL via INTRAVENOUS

## 2016-10-23 MED ORDER — ADULT MULTIVITAMIN LIQUID CH
15.0000 mL | Freq: Every day | ORAL | Status: DC
  Administered 2016-10-23 – 2016-10-24 (×2): 15 mL via ORAL
  Filled 2016-10-23 (×4): qty 15

## 2016-10-23 MED ORDER — POTASSIUM CHLORIDE 10 MEQ/100ML IV SOLN
10.0000 meq | INTRAVENOUS | Status: AC
  Administered 2016-10-23 – 2016-10-24 (×4): 10 meq via INTRAVENOUS
  Filled 2016-10-23 (×4): qty 100

## 2016-10-23 NOTE — Consult Note (Signed)
WOC Nurse wound consult note Reason for Consult: Bilateral Unna's Boots.  Patient states they were just applied on Saturday and that they typically remain in place for two weeks. Drainage strike-through noted on left LE at lateral side. Feet and great toes noted to be peeling and dry.  Wound type:Venous insufficiency Pressure Injury POA: N/A Measurement: Full thickness wound on left lateral LE measures 3cm x 2.2cm x 0.1cm  Wound BMW:UXLKbed:pink, moist Drainage (amount, consistency, odor) scant serous Periwound:intact with peeling, dry epidermis on bilateral feet and great toes. Some is removed with cleansing and moisturizing, other areas it will simply remain. Dressing procedure/placement/frequency: I will continue Unna's Boot therapy for management of edema with full thickness wound on left lateral LE but implement a once weekly change order.  This should be continued post discharge, if you agree, please order, arrange with Case Management. Should patient still be in house next week on Monday, Nursing and Ortho Tech will remove/replace on that day. WOC nursing team will not follow, but will remain available to this patient, the nursing and medical teams.  Please re-consult if needed. Thanks, Ladona MowLaurie Esra Frankowski, MSN, RN, GNP, Hans EdenCWOCN, CWON-AP, FAAN  Pager# [phone]

## 2016-10-23 NOTE — Progress Notes (Signed)
Pharmacy Antibiotic Note  Dorothy Dyer is a 41 y.o. female admitted on 10/22/2016 with Percocet overdose and started on N-acetylcysteine.  Pharmacy has been consulted for Vancomycin dosing for cellulitis.   PMH includes chronic lower extremity edema with stasis dermatitis and cellulitis with recent hospitalization (10/14/16-10/16/16) discharged on keflex which the patient reports not taking.  Plan: Vancomycin 1500 mg IV x1 then 1250 mg IV q12h. Measure Vanc trough at steady state.  Goal VT 10-15. Follow up renal fxn, culture results, and clinical course.   Height: 5' 1.5" (156.2 cm) Weight: 210 lb 1.6 oz (95.3 kg) IBW/kg (Calculated) : 48.95  Temp (24hrs), Avg:98 F (36.7 C), Min:97.5 F (36.4 C), Max:98.3 F (36.8 C)   Recent Labs Lab 10/22/16 0951 10/22/16 1933 10/23/16 0652  WBC 5.5 6.1 7.4  CREATININE 0.55 0.54 0.63    Estimated Creatinine Clearance: 97.6 mL/min (by C-G formula based on SCr of 0.63 mg/dL).    Allergies  Allergen Reactions  . Ibuprofen Nausea Only and Other (See Comments)    Bad stomach pains  . Toradol [Ketorolac Tromethamine] Anxiety  . Aspirin Hives  . Cleocin [Clindamycin Hcl] Itching and Rash    Antimicrobials this admission: 8/13 Vancomycin >>  Dose adjustments this admission:   Microbiology results: None  Thank you for allowing pharmacy to be a part of this patient's care.  Dorothy Dyer PharmD, BCPS Pager [phone] 10/23/2016 11:09 AM

## 2016-10-23 NOTE — Progress Notes (Addendum)
Received a call from [facility] saying the patient rang out V-tach. Went to check on patient and she appeared to be sleeping. Attempted to wake patient up. She was hard to awake. This RN did sternal rub and patient woke up groaning "stop". Patient has been lethargic most of the day but has not been this hard to arouse. Paged MD. MD stated he would come see the patient.

## 2016-10-23 NOTE — Care Management Note (Signed)
Case Management Note  Patient Details  Name: Glenford PeersCrystal Delbuono MRN: [id number] Date of Birth: 07/31/1975  Subjective/Objective:     ingestion               Action/Plan: Date:  October 23, 2016  Chart reviewed for concurrent status and case management needs.  Will continue to follow patient progress.  Discharge Planning: following for needs  Expected discharge date: [id number]  Marcelle SmilingRhonda Maryori Weide, BSN, [place] DunesRN3, [place]CCM   [phone]   Expected Discharge Date:                  Expected Discharge Plan:  Home w Home Health Services  In-House Referral:     Discharge planning Services  CM Consult  Post Acute Care Choice:    Choice offered to:  Patient  DME Arranged:    DME Agency:     HH Arranged:    HH Agency:     Status of Service:  In process, will continue to follow  If discussed at Long Length of Stay Meetings, dates discussed:    Additional Comments:  Golda AcreDavis, Lenore Moyano Lynn, RN 10/23/2016, 10:15 AM

## 2016-10-23 NOTE — Progress Notes (Signed)
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 Dr. Veda Canning, Ortho), current tobacco abuse, THC abuse, iron deficiency anemia, GERD, hypothyroidism, HTN, and many hospitalizations this year with most recent hospitalization (8/4) for LE cellulitis. Of note, the patient was also hospitalized this year on 7/18 for R clavicular fracture, 4/26 for R clavicle pain, and 3/22 for hypotension d/t opiate opverdose.   The patient was brought to the [facility] ED by EMS after she was IVC'd by a [employer] officer that found her unconscious on the side of the road with 30+ hydrocodone tablets in her mouth and a half empty bottle by her side. (refer to 8/12 [facility] assessment for further detail). At the time of her presentation to the ED, the patient stated, "I don't know why I am here. I just try to do a little something small for myself, and I went to [place] and to [place]...." There was concern for drug overdose and suicide attempt due to her previous March (3/22) admission d/t opiate overdose. ED Labs were significant for AST/ALT 226/121, UDS (+) for opiates, acetaminophen 25, salicylate level <7, EtOH <5, albumin 1.4, PT 16, INR 1.27, and UA pregnancy test negative. Vitals showed HR 122; BP 101/56. Poison control was consulted and she was started on mucomyst and admitted for further workup and placed with a sitter. Since presentation to [facility], the patient has denied any suicidal thought or ideation in the  ED, stating she "agreed to go to the hospital so that the police officer would not charge her for her expired license plates."   Assessment & Plan: Active Problems:   Anxiety   Acid reflux   Thyroid activity decreased   AVN of femur (HCC)   Bilateral lower extremity edema   Tylenol overdose  Suspected accidental Tylenol overdose with elevated LFTs -Discontinue Sitter. In ED, suspicion for intentional tylenol OD. Patient consistently denies suicial.  -Hold Oxycodone. Patient manages her pain with vicodin (prescription by PCP), tylenol, & aleve. Per Dr. Richardean Chimera 3/24 discharge note, patient's ortho MD recommends Tylenol & Ultram for pain management over opioids. -Recheck acetaminophen levels at [facility]. ?Possibly discontinue acetylcysteine - most recent dose yesterday 8/12. -Recheck ALT/AST.  Right upper and lower arm edema, erythema with contracture of R hand in setting of right clavicular fracture and right radial nerve palsy -H/o right clavicle fracture and R radial nerve palsy. OP follow-up for radial nerve - future nerve conduction test / EMG. Currently with significant edema and erythema of R arm's skin, yet only mildly tender to palpation.  -Concern for infection or compartment syndrome. -STAT CT of right humerus / forearm - pending.  Chronic bilateral lower extremity edema -Per Vascular Surgery: LE edema multifactorial and likely related to combination of poor skin care, hypothyroidism, and poor nutrition from gastric bypass surgery and mal absorption, and lymphedema versus venous stasis. No evidence of arterial disease and (-) doppler for DVT (8/5). 2D echo 02/2016 with preserved LV function. Recommendation made for wound care consult and consideration of Silvadene as well as  abx for cellulitis. Of note, home medications include keflex. -Hold diuretics (lasix, metolazone) d/t hypotension and tachycardia. -Consulted wound care. Suspect active infection / cellulitis. -Vancomycin per  pharmacy. Patient allergic to Clindamycin. MRSA (+) swab.  Swollen right eyelid, without pain or erythema -Possible etiologies include chalazion, injury to the eyelid. Patient states it may be d/t sleeping with her face against the side of the bed. -Monitor for signs of infection such as pain, erythema, continued swelling, discharge.   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 Ortho also recommended PT eval and walker with weightbearing as tolerated and pain management.  -Defer management to OP Ortho.  Malnutrition s/p gastric bypass surgery with ongoing hypoalbuminemia, hypoglycemia, and iron deficiency anemia.  -Dietitian consultation. Possible etiologies include bypass surgery, malabsorption, and poor oral intake. Patient states she has chronic abdominal pain since age 56 and has had extensive evaluation for her abdominal pain. Previous admission documentation suggests noncompliance with taking nutrients and following diet guidelines recommended s/p bypass.  -Baseline Hgb ~9. Labs currently with Hgb 9.3, albumin 1.3, glucose 67. -Continue dietary supplements as needed. -Recheck BMP, CMP.   Hypotension and Tachycardia despite history of Essential Hypertension -PMH of essential hypertension. Sinus tachycardia and hypotension in setting of recent vicodin and alcohol consumption and poor oral intake. No home medications for HTN. -Monitor vitals closely.  Hypothyroid -Continue home Synthroid. -Check TSH  GERD -Continue protonix.   Fibromyalgia -Continue lyrica.  Anxiety and Depression -Continue home nortriptyline and xanax.  Current tobacco, alcohol, and THC abuse  -Continue thiamine -Counseled regarding cessation.  DVT prophylaxis: Lovenox Code Status: Full Family Communication: None Disposition Plan: Home  with Home Health Services   Consultants:   Wound care  Procedures:   None  Antimicrobials:  Vancomycin per pharmacy 8/13>>>>  Subjective: Patient states that she does not know why she is here but on further questioning recounts a story similar to that presented in the above interim history. She denies current n/v/d/f/c. She states that she has some abdominal pain but it is chronic and has not changed recently. She states that she is unaware that her right eye is swollen but that it is not painful. She does not report active suicidal plan or intent and tearfully states that she was just trying to go to [place] after her son was dropped off at a friends/family member's place to go swimming.   Objective: Vitals:   10/22/16 2057 10/23/16 0015 10/23/16 0421 10/23/16 0925  BP: 102/62 105/72 (!) 101/56 (!) 99/59  Pulse: (!) 106 (!) 107 (!) 122 (!) 118  Resp: 14 16 16 18   Temp: 98.2 F (36.8 C) 97.7 F (36.5 C) 98.3 F (36.8 C) 98.2 F (36.8 C)  TempSrc: Oral Oral Oral Oral  SpO2: 98% 100% 96% 98%  Weight:   95.3 kg (210 lb 1.6 oz)   Height:        Intake/Output Summary (Last 24 hours) at 10/23/16 1100 Last data filed at 10/23/16 1058  Gross per 24 hour  Intake             1599 ml  Output              475 ml  Net             1124 ml  Filed Weights   10/22/16 1859 10/23/16 0421  Weight: 95.3 kg (210 lb 1.6 oz) 95.3 kg (210 lb 1.6 oz)    Examination:  Vitals:   10/22/16 2057 10/23/16 0015 10/23/16 0421 10/23/16 0925  BP: 102/62 105/72 (!) 101/56 (!) 99/59  Pulse: (!) 106 (!) 107 (!) 122 (!) 118  Resp: 14 16 16 18   Temp: 98.2 F (36.8 C) 97.7 F (36.5 C) 98.3 F (36.8 C) 98.2 F (36.8 C)  TempSrc: Oral Oral Oral Oral  SpO2: 98% 100% 96% 98%  Weight:   95.3 kg (210 lb 1.6 oz)   Height:        Constitutional: Somnolent female in no acute distress though intermittently tearful. Eyes: PERRLA, Left lid and conjunctivae normal. Right upper eyelid edema without  erythema. EOMI. ENMT: Mucous membranes are moist. No oropharyngeal exudates Neck: normal, supple, no masses Respiratory: clear to auscultation bilaterally, no wheezing, no crackles. Normal respiratory effort. No accessory muscle use.  Cardiovascular: Tachycardic. Regular rhythm, no murmurs / rubs / gallops. Bilateral lower extremity edema with b/l lower legs wrapped in ACE bandages. 2+ pedal pulses.   Abdomen: no tenderness to palpation. Bowel sounds positive.  Musculoskeletal: no clubbing / cyanosis. R arm contracture and limited ROM of right hand. Severe right arm circumferential edema and lower extremity edema (lower leg wrapped in ACE wraps). Deconditioned.  Skin: Erythema of entire right arm, and b/l legs. Swollen right upper eyelid. Neurologic: Slurred speech / sedation. ?Baseline.  Psychiatric: Normal judgment and insight. Alert and oriented x 3 on second attempt (first attempt AAOX2). Normal to tearful mood.    Data Reviewed: I have independently reviewed following labs and imaging studies   CBC:  Recent Labs Lab 10/22/16 0951 10/22/16 1933 10/23/16 0652  WBC 5.5 6.1 7.4  NEUTROABS 2.7  --   --   HGB 9.8* 11.3* 9.3*  HCT 28.8* 33.5* 27.2*  MCV 104.3* 105.0* 102.6*  PLT 169 207 192   Basic Metabolic Panel:  Recent Labs Lab 10/22/16 0951 10/22/16 1933 10/23/16 0652  NA 143  --  144  K 4.1  --  3.5  CL 113*  --  114*  CO2 22  --  21*  GLUCOSE 76  --  67  BUN 13  --  10  CREATININE 0.55 0.54 0.63  CALCIUM 7.6*  --  7.5*   GFR: Estimated Creatinine Clearance: 97.6 mL/min (by C-G formula based on SCr of 0.63 mg/dL). Liver Function Tests:  Recent Labs Lab 10/22/16 0951 10/23/16 0652  AST 226* 92*  ALT 121* 90*  ALKPHOS 156* 138*  BILITOT 1.1 0.9  PROT 4.4* 3.9*  ALBUMIN 1.4* 1.3*   No results for input(s): LIPASE, AMYLASE in the last 168 hours. No results for input(s): AMMONIA in the last 168 hours. Coagulation Profile:  Recent Labs Lab 10/22/16 1933  10/23/16 0652  INR 1.07 1.27   Cardiac Enzymes: No results for input(s): CKTOTAL, CKMB, CKMBINDEX, TROPONINI in the last 168 hours. BNP (last 3 results) No results for input(s): PROBNP in the last 8760 hours. HbA1C: No results for input(s): HGBA1C in the last 72 hours. CBG: No results for input(s): GLUCAP in the last 168 hours. Lipid Profile: No results for input(s): CHOL, HDL, LDLCALC, TRIG, CHOLHDL, LDLDIRECT in the last 72 hours. Thyroid Function Tests: No results for input(s): TSH, T4TOTAL, FREET4, T3FREE, THYROIDAB in the last 72 hours. Anemia Panel: No results for input(s): VITAMINB12, FOLATE, FERRITIN, TIBC, IRON, RETICCTPCT in the last 72 hours. Urine analysis:  Component Value Date/Time   COLORURINE AMBER (A) 10/15/2016 0650   APPEARANCEUR TURBID (A) 10/15/2016 0650   LABSPEC 1.038 (H) 10/15/2016 0650   PHURINE 5.0 10/15/2016 0650   GLUCOSEU NEGATIVE 10/15/2016 0650   HGBUR SMALL (A) 10/15/2016 0650   BILIRUBINUR SMALL (A) 10/15/2016 0650   KETONESUR NEGATIVE 10/15/2016 0650   PROTEINUR 100 (A) 10/15/2016 0650   UROBILINOGEN 1.0 06/18/2014 1316   NITRITE NEGATIVE 10/15/2016 0650   LEUKOCYTESUR MODERATE (A) 10/15/2016 0650   Sepsis Labs: Invalid input(s): PROCALCITONIN, LACTICIDVEN  Recent Results (from the past 240 hour(s))  Blood Culture (routine x 2)     Status: None   Collection Time: 10/14/16  4:34 PM  Result Value Ref Range Status   Specimen Description BLOOD BLOOD LEFT HAND  Final   Special Requests   Final    BOTTLES DRAWN AEROBIC AND ANAEROBIC Blood Culture results may not be optimal due to an inadequate volume of blood received in culture bottles   Culture NO GROWTH 5 DAYS  Final   Report Status 10/19/2016 FINAL  Final  Blood Culture (routine x 2)     Status: None   Collection Time: 10/14/16  5:47 PM  Result Value Ref Range Status   Specimen Description BLOOD BLOOD RIGHT FOREARM  Final   Special Requests   Final    BOTTLES DRAWN AEROBIC AND  ANAEROBIC Blood Culture adequate volume   Culture NO GROWTH 5 DAYS  Final   Report Status 10/19/2016 FINAL  Final  Urine culture     Status: Abnormal   Collection Time: 10/15/16  6:50 AM  Result Value Ref Range Status   Specimen Description URINE, RANDOM  Final   Special Requests NONE  Final   Culture MULTIPLE SPECIES PRESENT, SUGGEST RECOLLECTION (A)  Final   Report Status 10/16/2016 FINAL  Final  MRSA PCR Screening     Status: Abnormal   Collection Time: 10/15/16  9:55 AM  Result Value Ref Range Status   MRSA by PCR POSITIVE (A) NEGATIVE Final    Comment:        The GeneXpert MRSA Assay (FDA approved for NASAL specimens only), is one component of a comprehensive MRSA colonization surveillance program. It is not intended to diagnose MRSA infection nor to guide or monitor treatment for MRSA infections. RESULT CALLED TO, READ BACK BY AND VERIFIED WITH: ALLY SPEICHER ON 10/15/16 AT 1148 [facility]       Radiology Studies: No results found.   Scheduled Meds: . enoxaparin (LOVENOX) injection  40 mg Subcutaneous Q24H  . levothyroxine  125 mcg Oral QODAY  . levothyroxine  137 mcg Oral QODAY  . nortriptyline  75 mg Oral BID  . pantoprazole  40 mg Oral Daily  . pregabalin  225 mg Oral BID  . sodium chloride flush  3 mL Intravenous Q12H  . thiamine  100 mg Oral Daily  . topiramate  50 mg Oral QHS   Continuous Infusions: . sodium chloride    . acetylcysteine 15 mg/kg/hr (10/22/16 1800)     Marisue Ivan, Student-PA  10/23/2016 11:01 AM      Attending MD note  Patient was seen, examined,treatment plan was discussed with the PA-S.  I have personally reviewed the clinical findings, lab, imaging studies and management of this patient in detail. I agree with the documentation, as recorded by the PA-S  Patient is 41 y.o. female with past medical history significant for chronic pain secondary to AVN of right hip, chronic lower extremity edema with  stasis dermatitis and  cellulitis, right upper extremity edema of unclear etiology who was discharged less than one week ago after treatment for lower extremity cellulitis who is now brought in by EMS because she was found on the side of the road unconscious with many tablets in her mouth. She was noted to have an half empty Percocet bottle by her side.  On Exam: Gen. exam: Awake, alert, not in any distress Chest: Good air entry bilaterally, no rhonchi or rales CVS: S1-S2 regular, no murmurs Abdomen: Soft, nontender and nondistended Neurology: Non-focal Skin: erythematous rash right hand, bilateral LE rash, unna boot  Plan  Suspected accidental Tylenol overdose -Repeat Tylenol level pending, NAC per pharmacy.  LFTs are improving today -Patient denies suicidal ideation to me  Right arm edema/erythema -Concerning for cellulitis, she does not have classic symptoms for compartment syndrome and she is not as tender to palpation, recently had a DVT ultrasound at  which was negative -Could not obtain MRI as patient cannot be positioned in the MRI field, obtain a CT scan to rule out deep tissue infection  Chronic bilateral lower extremity wounds / edema -Wound care consulted, [facility] and did not recommend any surgical approach rather than conservative management -Hold diuretics due to soft blood pressures  Severe malnutrition status post gastric bypass -Consulted dietitian  AVN of the left hip /recent clavicle fracture -Outpatient management  Hypothyroidism -TSH was checked last week and it was borderline elevated around 6, will need to be rechecked as an outpatient in 2-3 weeks    Rest as above  Illya Gienger M. Elvera Lennox, MD [facility] [phone]  If 7PM-7AM, please contact night-coverage [email or web address] Password [email or web address]

## 2016-10-23 NOTE — Progress Notes (Signed)
Initial Nutrition Assessment  DOCUMENTATION CODES:   Severe malnutrition in context of chronic illness, Obesity unspecified  INTERVENTION:   Provide Premier Protein TID, each supplement provides 160kcal and 30g protein.  Provide Multivitamin with minerals daily  Vitamin/mineral supplementation recommendations: -500 mg Calcium with Vitamin D3 supplementation TID -Vitamin B-12, 1000 mcg daily  RD to continue to monitor for needs  NUTRITION DIAGNOSIS:   Malnutrition(severe) related to chronic illness (s/p Gastric bypass in 2003) as evidenced by severe depletion of muscle mass, energy intake < or equal to 75% for > or equal to 1 month, moderate to severe fluid accumulation.  GOAL:   Patient will meet greater than or equal to 90% of their needs  MONITOR:   PO intake, Supplement acceptance, Labs, Weight trends, I & O's, Skin  REASON FOR ASSESSMENT:   Consult Assessment of nutrition requirement/status  ASSESSMENT:    41 y.o. 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 She was noted to have an half empty Percocet bottle by her side.   Pt had gastric bypass surgery in 2003.   Patient followed by nutrition in previous admissions. Pt with history of severe malnutrition given poor PO intake and noncompliance with vitamins/minerals. During last admission on 8/5, pt was provided diet education by RD.  PTA, [place] was going [place] [employer] and was found in car with pills in her mouth. Pt reported to MD in ED that she was drinking vodka but currently denies this.   Pt consumed 30% of breakfast this morning (provided ~180 kcal and  10 g protein).  RD will order Premier Protein supplements and daily MVI. Recommend additional vitamin/mineral supplements (see above) at MD discretion. Agree with thiamine supplementation which is ordered already.  Per chart, pt's weight is +36 lb  From UBW of 174 lb. Pt with moderate edema in all extremities. Nutrition-Focused physical exam completed. Pt with moderate-severe muscle depletion.  Medications: Protonix tablet daily, Thiamine tablet daily Labs reviewed: Mg WNL  Diet Order:  Diet 2 gram sodium Room service appropriate? Yes; Fluid consistency: Thin  Skin:    Stg II to thigh (2 wounds) and left buttocks, Stg I coccyx  Last BM:  8/4  Height:   Ht Readings from Last 1 Encounters:  10/22/16 5' 1.5" (1.562 m)    Weight:   Wt Readings from Last 1 Encounters:  10/23/16 210 lb 1.6 oz (95.3 kg)    Ideal Body Weight:  50 kg  BMI:  Body mass index is 39.05 kg/m.  Estimated Nutritional Needs:   Kcal:  1800-2000  Protein:  90-100g  Fluid:  2L/day  EDUCATION NEEDS:   No education needs identified at this time  Tilda FrancoLindsey Felton Buczynski, MS, RD, LDN Pager: [phone] After Hours Pager: [phone]

## 2016-10-24 ENCOUNTER — Inpatient Hospital Stay (HOSPITAL_COMMUNITY): Payer: Medicare HMO

## 2016-10-24 DIAGNOSIS — M79609 Pain in unspecified limb: Secondary | ICD-10-CM

## 2016-10-24 DIAGNOSIS — E039 Hypothyroidism, unspecified: Secondary | ICD-10-CM

## 2016-10-24 DIAGNOSIS — I361 Nonrheumatic tricuspid (valve) insufficiency: Secondary | ICD-10-CM

## 2016-10-24 DIAGNOSIS — M7989 Other specified soft tissue disorders: Secondary | ICD-10-CM

## 2016-10-24 LAB — COMPREHENSIVE METABOLIC PANEL
ALK PHOS: 188 U/L — AB (ref 38–126)
ALT: 78 U/L — AB (ref 14–54)
AST: 74 U/L — AB (ref 15–41)
Albumin: 1.3 g/dL — ABNORMAL LOW (ref 3.5–5.0)
Anion gap: 6 (ref 5–15)
BILIRUBIN TOTAL: 1 mg/dL (ref 0.3–1.2)
BUN: 8 mg/dL (ref 6–20)
CALCIUM: 7.5 mg/dL — AB (ref 8.9–10.3)
CHLORIDE: 117 mmol/L — AB (ref 101–111)
CO2: 22 mmol/L (ref 22–32)
CREATININE: 0.44 mg/dL (ref 0.44–1.00)
Glucose, Bld: 79 mg/dL (ref 65–99)
Potassium: 3.8 mmol/L (ref 3.5–5.1)
Sodium: 145 mmol/L (ref 135–145)
TOTAL PROTEIN: 4 g/dL — AB (ref 6.5–8.1)

## 2016-10-24 LAB — ECHOCARDIOGRAM COMPLETE
Height: 61.5 in
Weight: 3354.52 oz

## 2016-10-24 LAB — TSH: TSH: 3.982 u[IU]/mL (ref 0.350–4.500)

## 2016-10-24 LAB — CBC
HCT: 31.6 % — ABNORMAL LOW (ref 36.0–46.0)
Hemoglobin: 10.5 g/dL — ABNORMAL LOW (ref 12.0–15.0)
MCH: 34 pg (ref 26.0–34.0)
MCHC: 33.2 g/dL (ref 30.0–36.0)
MCV: 102.3 fL — ABNORMAL HIGH (ref 78.0–100.0)
PLATELETS: 118 10*3/uL — AB (ref 150–400)
RBC: 3.09 MIL/uL — AB (ref 3.87–5.11)
RDW: 15 % (ref 11.5–15.5)
WBC: 5.3 10*3/uL (ref 4.0–10.5)

## 2016-10-24 MED ORDER — FUROSEMIDE 10 MG/ML IJ SOLN
20.0000 mg | Freq: Every day | INTRAMUSCULAR | Status: DC
  Administered 2016-10-24: 20 mg via INTRAVENOUS
  Filled 2016-10-24: qty 2

## 2016-10-24 MED ORDER — LACTULOSE 10 GM/15ML PO SOLN
30.0000 g | ORAL | Status: DC
  Administered 2016-10-24 (×2): 30 g via ORAL
  Filled 2016-10-24 (×2): qty 45

## 2016-10-24 MED ORDER — KETOROLAC TROMETHAMINE 15 MG/ML IJ SOLN
15.0000 mg | Freq: Three times a day (TID) | INTRAMUSCULAR | Status: DC | PRN
  Administered 2016-10-24: 15 mg via INTRAVENOUS
  Filled 2016-10-24: qty 1

## 2016-10-24 NOTE — Progress Notes (Signed)
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 Dr. Veda Canning, Ortho), current tobacco abuse, THC abuse, iron deficiency anemia, GERD, hypothyroidism, HTN, and many hospitalizations this year with most recent hospitalization (8/4) for LE cellulitis. Of note, the patient was also hospitalized this year on 7/18 for R clavicular fracture, 4/26 for R clavicle pain, and 3/22 for hypotension d/t opiate opverdose.   The patient was brought to the [facility] ED by EMS after she was IVC'd by a [employer] officer that found her unconscious on the side of the road with 30+ hydrocodone tablets in her mouth and a half empty bottle by her side. (refer to 8/12 [facility] assessment for further detail). At the time of her presentation to the ED, the patient stated, "I don't know why I am here. I just try to do a little something small for myself, and I went to [place] and to [place]...." There was concern for drug overdose and suicide attempt due to her previous March (3/22) admission d/t opiate overdose. ED Labs were significant for AST/ALT 226/121, UDS (+) for opiates, acetaminophen 25, salicylate level <7, EtOH <5, albumin 1.4, PT 16, INR 1.27, and UA pregnancy test negative. Vitals showed HR 122; BP 101/56. Poison control was consulted and she was started on mucomyst and admitted for further workup and placed with a sitter. Since presentation to [facility], the patient has denied any suicidal thought or ideation in the  ED, stating she "agreed to go to the hospital so that the police officer would not charge her for her expired license plates."  Imaging: RUQ US shows small right pleural effusion and is consistent with hepatic fatty infiltration and / or hepatocellular disease and questions mild ascites. CT of right arm shows diffuse anasarca, healing R clavicle fracture, mild mass effect on R subclavian vein, and probable calcific tendonitis of infraspinatus tendon. Pending results of right upper extremity ultrasound to r/o DVT.   Assessment & Plan: Active Problems:   Anxiety   Acid reflux   Thyroid activity decreased   AVN of femur (HCC)   Bilateral lower extremity edema   Tylenol overdose  Suspected accidental Tylenol overdose with elevated LFTs -Patient consistently denies suicial. Patient manages her pain with vicodin (prescription by PCP), tylenol, & aleve. Per Dr. Richardean Chimera 3/24 discharge note, patient's ortho MD recommends Tylenol & Ultram for pain management over opioids. Given acetylcysteine in ED. -Acetaminophen levels: (8/12) at 25, (8/13) at <10.  -RUQ [place] consistent with hepatic fatty infiltration and / or hepatocellular disease. -ALT 121>>90>>71;  AST 226>>92>>62. -Lactulose ordered for high ammonia (134).  Right upper and lower arm edema, erythema with contracture of R hand in setting of right clavicular fracture and right radial nerve palsy -H/o right clavicle fracture and R radial nerve palsy. OP follow-up for radial nerve - future nerve conduction test / EMG. Currently with significant edema and erythema of R arm's skin, yet only mildly tender to palpation.  -CT of right arm shows diffuse anasarca, healing clavicle, and mild mass effect on R  subclavian vein as above.  -Right arm ultrasound pending.  Chronic bilateral lower extremity edema -Per Vascular Surgery: LE edema multifactorial and likely related to combination of poor skin care, hypothyroidism, and poor nutrition from gastric  bypass surgery and mal absorption, and lymphedema versus venous stasis. No evidence of arterial disease and (-) doppler for DVT (8/5). 2D echo 02/2016 with preserved LV function. Recommendation made for wound care consult and consideration of Silvadene as well as abx for cellulitis. Of note, home medications include keflex. -Hold diuretics (lasix, metolazone) d/t hypotension and tachycardia. -Consulted wound care. See [facility] note 8/13. -Vancomycin per pharmacy. Patient allergic to Clindamycin. MRSA (+) swab.   Malnutrition s/p gastric bypass surgery with ongoing hypoalbuminemia, hypoglycemia, and iron deficiency anemia.  -Dietitian consultation. Possible etiologies include bypass surgery, malabsorption, and poor oral intake. Patient states she has chronic abdominal pain since age 8 and has had extensive evaluation for her abdominal pain. Previous admission documentation suggests noncompliance with taking nutrients and following diet guidelines recommended s/p bypass.  -Baseline Hgb ~9. Labs currently with Hgb 9.3, albumin 1.3, glucose 67. -Continue dietary supplements as needed. -Recheck BMP, CMP.   Hypotension and Tachycardia despite history of Essential Hypertension -PMH of essential hypertension. Sinus tachycardia and hypotension in setting of recent vicodin and alcohol consumption and poor oral intake. No home medications for HTN. Patient reportedly with vtach run (8/13, PM) - found difficult to awake until sternal rub performed. Patient remains lethargic. -Echocardiogram with grade 1 diastolic dysfunction - pending final results. -Continue to monitor vitals closely.  Swollen right eyelid, without pain or erythema, resolved -Patient states it may have been d/t sleeping with her face against the side of the bed.  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 Ortho also recommended PT eval and walker with weightbearing as tolerated and pain management.  -Defer management to OP Ortho.  Hypomagnesemia -Magnesium level was 1.6. Replaced with magnesium sulfate.  -BMP  Hypothyroid -Continue home Synthroid.  GERD -Continue protonix.   Fibromyalgia -Continue lyrica.  Anxiety and Depression -Continue home nortriptyline and xanax.  Current tobacco, alcohol, and THC abuse  -Continue thiamine as needed -Counseled regarding cessation.  DVT prophylaxis: Lovenox Code Status: Full Family Communication: None Disposition Plan: Home with Home Health Services   Consultants:   Wound care  Procedures:   Echocardioagram  Antimicrobials:  Vancomycin per pharmacy 8/13>>>>  Subjective: Patient very somnolent today so poor historian. Reports continued abdominal pain in between falling asleep. Asks for menu to order food then falls asleep.   Objective: Vitals:   10/23/16 1800 10/23/16 2023 10/24/16 0440 10/24/16 1017  BP: (!) 97/58 104/70 111/73 109/67  Pulse: (!) 101 100 97 (!) 104  Resp:  16 16 18   Temp:  99.5 F (37.5 C) 98.4 F (36.9 C) (!) 97 F (36.1 C)  TempSrc:  Oral Axillary Axillary  SpO2:  99% 100% 100%  Weight:   95.1 kg (209 lb 10.5 oz)   Height:        Intake/Output Summary (Last 24 hours) at 10/24/16 1307 Last data filed at 10/24/16 1610  Gross per 24 hour  Intake          1457.12 ml  Output                0 ml  Net          1457.12 ml   Filed Weights   10/22/16 1859 10/23/16 0421 10/24/16 0440  Weight: 95.3 kg (210 lb 1.6 oz) 95.3 kg (210 lb 1.6 oz) 95.1 kg (209 lb 10.5 oz)    Examination:  Vitals:   10/23/16 1800 10/23/16 2023 10/24/16 0440 10/24/16 1017  BP: (!) 97/58 104/70 111/73 109/67  Pulse: (!) 101 100 97 (!) 104  Resp:  16 16 18   Temp:  99.5 F (37.5 C) 98.4 F (36.9 C) (!) 97 F (36.1 C)  TempSrc:  Oral Axillary Axillary  SpO2:  99% 100% 100%  Weight:    95.1 kg (209 lb 10.5 oz)   Height:        Constitutional: Somnolent obese female  Eyes: lids and conjunctivae normal.  ENMT: Mucous membranes are moist. No oropharyngeal exudates Neck: normal, supple Respiratory: clear to auscultation bilaterally, no wheezing, no crackles. Normal respiratory effort. No accessory muscle use.  Cardiovascular: Tachycardic. Regular rhythm, no murmurs / rubs / gallops. Bilateral lower extremity edema with b/l lower legs wrapped in ACE bandages. 2+ pedal pulses.   Abdomen: slight tenderness to palpation.  Musculoskeletal: no clubbing / cyanosis. Reduced ROM of right hand with improving edema and erythema. Bilateral lower extremity edema (lower leg wrapped in ACE wraps). Deconditioned.  Skin: Improving / mild erythema of entire right arm. Skin warm and dry to touch Neurologic: Slurred speech / sedation    Data Reviewed: I have independently reviewed following labs and imaging studies   CBC:  Recent Labs Lab 10/22/16 0951 10/22/16 1933 10/23/16 0652  WBC 5.5 6.1 7.4  NEUTROABS 2.7  --   --   HGB 9.8* 11.3* 9.3*  HCT 28.8* 33.5* 27.2*  MCV 104.3* 105.0* 102.6*  PLT 169 207 192   Basic Metabolic Panel:  Recent Labs Lab 10/22/16 0951 10/22/16 1933 10/23/16 0652 10/23/16 1532 10/23/16 1935  NA 143  --  144 146*  --   K 4.1  --  3.5 3.2*  --   CL 113*  --  114* 118*  --   CO2 22  --  21* 22  --   GLUCOSE 76  --  67 89  --   BUN 13  --  10 9  --   CREATININE 0.55 0.54 0.63 0.53  --   CALCIUM 7.6*  --  7.5* 7.3*  --   MG  --   --   --   --  1.6*  PHOS  --   --   --   --  2.9   GFR: Estimated Creatinine Clearance: 97.5 mL/min (by C-G formula based on SCr of 0.53 mg/dL). Liver Function Tests:  Recent Labs Lab 10/22/16 0951 10/23/16 0652 10/23/16 1532  AST 226* 92* 61*  62*  ALT 121* 90* 72*  71*  ALKPHOS 156* 138* 126  BILITOT 1.1 0.9 0.9  PROT 4.4* 3.9* 3.3*  ALBUMIN 1.4* 1.3* 1.1*   No results for input(s): LIPASE, AMYLASE in  the last 168 hours.  Recent Labs Lab 10/23/16 1935  AMMONIA 134*   Coagulation Profile:  Recent Labs Lab 10/22/16 1933 10/23/16 0652 10/23/16 1532  INR 1.07 1.27 1.29   Cardiac Enzymes: No results for input(s): CKTOTAL, CKMB, CKMBINDEX, TROPONINI in the last 168 hours. BNP (last 3 results) No results for input(s): PROBNP in the last 8760 hours. HbA1C: No results for input(s): HGBA1C in the last 72 hours. CBG: No results for input(s): GLUCAP in  the last 168 hours. Lipid Profile: No results for input(s): CHOL, HDL, LDLCALC, TRIG, CHOLHDL, LDLDIRECT in the last 72 hours. Thyroid Function Tests: No results for input(s): TSH, T4TOTAL, FREET4, T3FREE, THYROIDAB in the last 72 hours. Anemia Panel: No results for input(s): VITAMINB12, FOLATE, FERRITIN, TIBC, IRON, RETICCTPCT in the last 72 hours. Urine analysis:    Component Value Date/Time   COLORURINE AMBER (A) 10/15/2016 0650   APPEARANCEUR TURBID (A) 10/15/2016 0650   LABSPEC 1.038 (H) 10/15/2016 0650   PHURINE 5.0 10/15/2016 0650   GLUCOSEU NEGATIVE 10/15/2016 0650   HGBUR SMALL (A) 10/15/2016 0650   BILIRUBINUR SMALL (A) 10/15/2016 0650   KETONESUR NEGATIVE 10/15/2016 0650   PROTEINUR 100 (A) 10/15/2016 0650   UROBILINOGEN 1.0 06/18/2014 1316   NITRITE NEGATIVE 10/15/2016 0650   LEUKOCYTESUR MODERATE (A) 10/15/2016 0650   Sepsis Labs: Invalid input(s): PROCALCITONIN, LACTICIDVEN  Recent Results (from the past 240 hour(s))  Blood Culture (routine x 2)     Status: None   Collection Time: 10/14/16  4:34 PM  Result Value Ref Range Status   Specimen Description BLOOD BLOOD LEFT HAND  Final   Special Requests   Final    BOTTLES DRAWN AEROBIC AND ANAEROBIC Blood Culture results may not be optimal due to an inadequate volume of blood received in culture bottles   Culture NO GROWTH 5 DAYS  Final   Report Status 10/19/2016 FINAL  Final  Blood Culture (routine x 2)     Status: None   Collection Time: 10/14/16  5:47 PM    Result Value Ref Range Status   Specimen Description BLOOD BLOOD RIGHT FOREARM  Final   Special Requests   Final    BOTTLES DRAWN AEROBIC AND ANAEROBIC Blood Culture adequate volume   Culture NO GROWTH 5 DAYS  Final   Report Status 10/19/2016 FINAL  Final  Urine culture     Status: Abnormal   Collection Time: 10/15/16  6:50 AM  Result Value Ref Range Status   Specimen Description URINE, RANDOM  Final   Special Requests NONE  Final   Culture MULTIPLE SPECIES PRESENT, SUGGEST RECOLLECTION (A)  Final   Report Status 10/16/2016 FINAL  Final  MRSA PCR Screening     Status: Abnormal   Collection Time: 10/15/16  9:55 AM  Result Value Ref Range Status   MRSA by PCR POSITIVE (A) NEGATIVE Final    Comment:        The GeneXpert MRSA Assay (FDA approved for NASAL specimens only), is one component of a comprehensive MRSA colonization surveillance program. It is not intended to diagnose MRSA infection nor to guide or monitor treatment for MRSA infections. RESULT CALLED TO, READ BACK BY AND VERIFIED WITH: ALLY SPEICHER ON 10/15/16 AT 1148 [facility]       Radiology Studies: Ct Humerus Right W Contrast  Result Date: 10/24/2016 CLINICAL DATA:  Right arm swelling and erythema with contracture of the right hand in the setting of a right clavicular fracture and right radial nerve palsy. Concern for infection or compartment syndrome. EXAM: CT OF THE UPPER RIGHT EXTREMITY WITH CONTRAST TECHNIQUE: Multidetector CT imaging of the right humerus and forearm was performed according to the standard protocol following intravenous contrast administration. COMPARISON:  Right humerus x-rays dated August 23, 2016. CONTRAST:  ISOVUE-300 IOPAMIDOL (ISOVUE-300) INJECTION 61% FINDINGS: Bones/Joint/Cartilage There is a subacute, comminuted fracture of the midclavicle with approximately 1 shaft bone width posterior displacement of the distal fragment. There is surrounding callus formation. No additional fractures.  No  glenohumeral or elbow joint effusion. Ligaments Suboptimally assessed by CT. Muscles and Tendons Calcification adjacent to the posterior greater tuberosity in the region of the distal infraspinatus tendon. Soft tissues Diffuse anasarca in the visualized soft tissues. No drainable fluid collection. There is mild mass effect on the right subclavian vein as it passes behind the healing clavicle fracture. The vein appears patent. No axillary lymphadenopathy.  The visualized right lung is clear. IMPRESSION: 1. Diffuse anasarca in the visualized soft tissues, including the right arm and right chest and abdominal wall. No discrete drainable fluid collection is seen. Please note that the diagnosis of compartment syndrome cannot be established by imaging. 2. Subacute, healing comminuted fracture of the mid clavicle with approximately 1 shaft bone width posterior displacement of the distal fragment. 3. Mild mass effect on the right subclavian vein as it passes behind the healing clavicle fracture. The vein appears patent. If there is clinical concern for DVT, recommend further evaluation with ultrasound. 4. Probable calcific tendinitis of the infraspinatus tendon. Electronically Signed   By: Obie Dredge M.D.   On: 10/24/2016 08:25   Ct Forearm Right W Contrast  Result Date: 10/24/2016 CLINICAL DATA:  Right arm swelling and erythema with contracture of the right hand in the setting of a right clavicular fracture and right radial nerve palsy. Concern for infection or compartment syndrome. EXAM: CT OF THE UPPER RIGHT EXTREMITY WITH CONTRAST TECHNIQUE: Multidetector CT imaging of the right humerus and forearm was performed according to the standard protocol following intravenous contrast administration. COMPARISON:  Right humerus x-rays dated August 23, 2016. CONTRAST:  ISOVUE-300 IOPAMIDOL (ISOVUE-300) INJECTION 61% FINDINGS: Bones/Joint/Cartilage There is a subacute, comminuted fracture of the midclavicle with  approximately 1 shaft bone width posterior displacement of the distal fragment. There is surrounding callus formation. No additional fractures.  No glenohumeral or elbow joint effusion. Ligaments Suboptimally assessed by CT. Muscles and Tendons Calcification adjacent to the posterior greater tuberosity in the region of the distal infraspinatus tendon. Soft tissues Diffuse anasarca in the visualized soft tissues. No drainable fluid collection. There is mild mass effect on the right subclavian vein as it passes behind the healing clavicle fracture. The vein appears patent. No axillary lymphadenopathy.  The visualized right lung is clear. IMPRESSION: 1. Diffuse anasarca in the visualized soft tissues, including the right arm and right chest and abdominal wall. No discrete drainable fluid collection is seen. Please note that the diagnosis of compartment syndrome cannot be established by imaging. 2. Subacute, healing comminuted fracture of the mid clavicle with approximately 1 shaft bone width posterior displacement of the distal fragment. 3. Mild mass effect on the right subclavian vein as it passes behind the healing clavicle fracture. The vein appears patent. If there is clinical concern for DVT, recommend further evaluation with ultrasound. 4. Probable calcific tendinitis of the infraspinatus tendon. Electronically Signed   By: Obie Dredge M.D.   On: 10/24/2016 08:25   US Abdomen Limited Ruq  Result Date: 10/24/2016 CLINICAL DATA:  Right upper quadrant pain. EXAM: ULTRASOUND ABDOMEN LIMITED RIGHT UPPER QUADRANT COMPARISON:  MRI 06/01/2016.  CT 06/18/2014 . FINDINGS: Gallbladder: Cholecystectomy. Common bile duct: Diameter: 6.7 mm Liver: Increased echogenicity of the liver noted consistent fatty infiltration and/or hepatocellular disease. A 1.1 cm simple cyst in the left hepatic lobe. Questionable very mild ascites. Tiny right pleural effusion. IMPRESSION: 1. Cholecystectomy.  No biliary distention. 2.  Increased hepatic echogenicity consistent fatty infiltration and/or hepatocellular disease. Questions mild ascites. Small right pleural effusion.  Electronically Signed   By: Maisie Fushomas  Register   On: 10/24/2016 10:05     Scheduled Meds: . enoxaparin (LOVENOX) injection  40 mg Subcutaneous Q24H  . lactulose  30 g Oral TID  . levothyroxine  125 mcg Oral QODAY  . levothyroxine  137 mcg Oral QODAY  . multivitamin  15 mL Oral Daily  . pantoprazole  40 mg Oral Daily  . protein supplement shake  11 oz Oral TID BM  . sodium chloride flush  3 mL Intravenous Q12H  . thiamine  100 mg Oral Daily  . topiramate  50 mg Oral QHS   Continuous Infusions: . sodium chloride    . vancomycin 1,250 mg (10/24/16 1200)     Marisue IvanJacquelyn Vernetta Dizdarevic, Student-PA  10/24/2016 1:07 PM      Attending MD note  Patient was seen, examined,treatment plan was discussed with the PA-S.  I have personally reviewed the clinical findings, lab, imaging studies and management of this patient in detail. I agree with the documentation, as recorded by the PA-S  Patient is 41 y.o. 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 She was noted to have an half empty Percocet bottle by her side.  On Exam: Gen. exam: Awake, alert, not in any distress Chest: Good air entry bilaterally, no rhonchi or rales CVS: S1-S2 regular, no murmurs Abdomen: Soft, nontender and nondistended Neurology: Non-focal Skin: erythematous rash right hand, bilateral LE rash, unna boot  Plan  Suspected accidental Tylenol overdose -Repeat Tylenol level pending, NAC per pharmacy.  LFTs are improving  today -Patient denies suicidal ideation to me  Right arm edema/erythema -Concerning for cellulitis, she does not have classic symptoms for compartment syndrome and she is not as tender to palpation, recently had a DVT ultrasound at [place] which was negative -Could not obtain MRI as patient cannot be positioned in the MRI field, obtain a CT scan to rule out deep tissue infection  Chronic bilateral lower extremity wounds / edema -Wound care consulted, [facility] and did not recommend any surgical approach rather than conservative management -Hold diuretics due to soft blood pressures  Severe malnutrition status post gastric bypass -Consulted dietitian  AVN of the left hip /recent clavicle fracture -Outpatient management  Hypothyroidism -TSH was checked last week and it was borderline elevated around 6, will need to be rechecked as an outpatient in 2-3 weeks    Rest as above  Costin M. Elvera LennoxGherghe, MD [facility] [phone]  If 7PM-7AM, please contact night-coverage [email or web address] Password [email or web address]

## 2016-10-24 NOTE — Plan of Care (Deleted)
Lethargy following acetaminophen  OD. Ammonia level drawn and was 134.Lactulose ordered.  Magnesium level was 1.6 mg/dL and was replaced with magnesium sulfate IVPB. Continue all other measures.  Sanda Kleinavid Marykathryn Carboni, MD

## 2016-10-24 NOTE — Progress Notes (Signed)
  Echocardiogram 2D Echocardiogram has been performed.  Leta JunglingCooper, Nettie Wyffels M 10/24/2016, 1:01 PM

## 2016-10-24 NOTE — Progress Notes (Addendum)
VASCULAR LAB PRELIMINARY  PRELIMINARY  PRELIMINARY  PRELIMINARY  Right upper extremity venous duplex completed.    Preliminary report:  Right :  No evidence of DVT or superficial thrombosis. No change since study of 09/27/2016 [facility]  TyeSLAUGHTER, [place], [place] 10/24/2016, 1:33 PM

## 2016-10-24 NOTE — Progress Notes (Signed)
PT Cancellation Note  Patient Details Name: Dorothy Dyer MRN: [id number] DOB: 09/15/1975   Cancelled Treatment:    Reason Eval/Treat Not Completed: Medical issues which prohibited therapy (having frequent stools with lactuliose, also somnolent. )   [name], [name] 10/24/2016, 1:57 PM [name] PT [phone]

## 2016-10-25 DIAGNOSIS — T391X1D Poisoning by 4-Aminophenol derivatives, accidental (unintentional), subsequent encounter: Secondary | ICD-10-CM

## 2016-10-25 LAB — AMMONIA: Ammonia: 99 umol/L — ABNORMAL HIGH (ref 9–35)

## 2016-10-25 MED ORDER — LACTULOSE 10 GM/15ML PO SOLN: 20.0000 g | Freq: Two times a day (BID) | ORAL | Status: DC

## 2016-10-25 MED ORDER — ALBUMIN HUMAN 25 % IV SOLN
12.5000 g | Freq: Once | INTRAVENOUS | Status: AC
  Administered 2016-10-25: 12.5 g via INTRAVENOUS
  Filled 2016-10-25: qty 50

## 2016-10-25 MED ORDER — NAPROXEN 250 MG PO TABS
250.0000 mg | ORAL_TABLET | Freq: Two times a day (BID) | ORAL | Status: DC
  Administered 2016-10-25 – 2016-10-26 (×2): 250 mg via ORAL
  Filled 2016-10-25 (×2): qty 1

## 2016-10-25 MED ORDER — ADULT MULTIVITAMIN W/MINERALS CH
1.0000 | ORAL_TABLET | Freq: Every day | ORAL | Status: DC
  Administered 2016-10-26: 1 via ORAL
  Filled 2016-10-25: qty 1

## 2016-10-25 MED ORDER — FUROSEMIDE 10 MG/ML IJ SOLN
40.0000 mg | Freq: Once | INTRAMUSCULAR | Status: AC
  Administered 2016-10-25: 40 mg via INTRAVENOUS
  Filled 2016-10-25: qty 4

## 2016-10-25 MED ORDER — LACTULOSE 10 GM/15ML PO SOLN
20.0000 g | Freq: Two times a day (BID) | ORAL | Status: DC
  Administered 2016-10-26: 20 g via ORAL
  Filled 2016-10-25 (×3): qty 30

## 2016-10-25 NOTE — Progress Notes (Signed)
PT Cancellation Note  Patient Details Name: Dorothy PeersCrystal Dyer MRN: [id number] DOB: 09/22/1975   Cancelled Treatment:    Reason Eval/Treat Not Completed: Medical issues which prohibited therapy-with OT earlier, now dumping large amount of urine  After lasix and has external catheter. Will check back tomorrow.   Sharen HeckHill, Boyce Keltner Elizabeth Rifky Lapre PT [phone]  10/25/2016, 4:13 PM

## 2016-10-25 NOTE — Progress Notes (Signed)
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 Dr. Veda CanningSwintek, Ortho), current tobacco abuse, THC abuse, iron deficiency anemia, GERD, hypothyroidism, HTN, and many hospitalizations this year with most recent hospitalization (8/4) for LE cellulitis. Of note, the patient was also hospitalized this year on 7/18 for R clavicular fracture, 4/26 for R clavicle pain, and 3/22 for hypotension d/t opiate opverdose.   [facility] after she was IVC'd by a [employer] officer that found her unconscious on the side of the road with 30+ hydrocodone tablets in her mouth and a half empty bottle by her side. (refer to [facility] for further detail). At the time of her presentation to the ED, the patient stated, "I don't know why I am here. I just try to do a little something small for myself, and I went to [employer] and to [place]...." There was concern for drug overdose and suicide attempt due to her previous March (3/22) admission d/t opiate overdose. ED Labs were significant for AST/ALT 226/121, UDS (+) for opiates, acetaminophen 25, salicylate level <7, EtOH <5, albumin 1.4, PT 16, INR 1.27, and UA pregnancy test negative. Vitals showed HR 122; BP 101/56. Poison control was consulted and she was started on mucomyst and admitted for further workup and placed with a sitter. [facility], the patient has denied any suicidal thought or ideation in the  ED, stating she "agreed to go to the hospital so that the police officer would not charge her for her expired license plates."  Imaging: RUQ US shows small right pleural effusion and is consistent with hepatic fatty infiltration and / or hepatocellular disease and questions mild ascites. CT of right arm shows diffuse anasarca, healing R clavicle fracture, mild mass effect on R subclavian vein, and probable calcific tendonitis of infraspinatus tendon. Pending results of right upper extremity ultrasound to r/o DVT.   Assessment & Plan: Suspected accidental Tylenol overdose in the setting of underlying liver disease -Poison control was contacted on admission, recommended N-acetylcysteine.  Per protocol, repeat Tylenol level was decreased and an acetylcysteine was discontinued on 8/13.  Patient's LFTs have been improving. -Given lethargy, ammonia was checked and was found to be elevated 130.  Right upper quadrant ultrasound shows that patient has potential liver disease, she was started on lactulose, continue. -She has persistently denied suicidal ideation as the cause for her Percocet ingestion  Acute encephalopathy -Likely multifactorial due to progressive congestion/elevated ammonia level/unknown amount of other home medications that she might have taken prior to admission -Continue lactulose for now, vital signs are stable and she is maintaining her oxygen sats  Right arm edema/erythema -Concerning for cellulitis, she does not have classic symptoms for compartment syndrome and she is not as tender to palpation -CT scan of the arm showed anasarca, there was concern for a DVT as it was observed a mild mass effect in the right subclavian vein, however repeat ultrasound was negative for any deep vein thrombosis  Chronic bilateral  lower extremity wounds / edema /cellulitis -Wound care consulted, vascular surgery was consulted at [facility] and did not recommend any surgical approach rather than  conservative management -Wound care consulted, continue [place] boot for now -Continue antibiotics for cellulitis for now  Anasarca -?  Related to liver disease as well as severe malnutrition,  -A 2D echo was done which showed normal ejection fraction and no significant evidence for heart failure Continue IV diuresis with albumin.  Severe malnutrition status post gastric bypass -Consulted dietitian  AVN of the left hip /recent clavicle fracture -Outpatient management  Hypothyroidism -TSH was checked last week and it was borderline elevated around 6, will need to be rechecked as an outpatient in 2-3 weeks -Given lethargy, will repeat the TSH  GERD -Continue protonix.   Fibromyalgia -Continue lyrica.  Anxiety and Depression -Continue home nortriptyline and xanax.  Current tobacco, alcohol, and THC abuse  -Continue thiamine as needed -Counseled regarding cessation.  DVT prophylaxis: Lovenox Code Status: Full Family Communication: None Disposition Plan: Home with Home Health Services   Consultants:   Wound care  Procedures:   Echocardioagram  Antimicrobials:  Vancomycin per pharmacy 8/13>>>>  Subjective: More awake. Able to answered questions appropriately. Requesting medication for her headache. No nausea or vomiting.  Objective: Vitals:   10/24/16 2043 10/25/16 0130 10/25/16 0444 10/25/16 1416  BP: 116/88 103/70 (!) 107/57 114/82  Pulse: 95 (!) 101 99 93  Resp: 20 16 16 18   Temp: 98.1 F (36.7 C) 98.6 F (37 C) 98.3 F (36.8 C) 98 F (36.7 C)  TempSrc: Oral Oral Oral Oral  SpO2: 100% 100% 100% 100%  Weight:      Height:        Intake/Output Summary (Last 24 hours) at 10/25/16 1753 Last data filed at 10/25/16 1500  Gross per 24 hour  Intake              760 ml  Output              200 ml  Net              560 ml   Filed Weights   10/22/16 1859 10/23/16 0421 10/24/16 0440  Weight: 95.3 kg (210 lb 1.6 oz) 95.3 kg (210 lb 1.6 oz) 95.1 kg (209  lb 10.5 oz)    Examination:  Vitals:   10/24/16 2043 10/25/16 0130 10/25/16 0444 10/25/16 1416  BP: 116/88 103/70 (!) 107/57 114/82  Pulse: 95 (!) 101 99 93  Resp: 20 16 16 18   Temp: 98.1 F (36.7 C) 98.6 F (37 C) 98.3 F (36.8 C) 98 F (36.7 C)  TempSrc: Oral Oral Oral Oral  SpO2: 100% 100% 100% 100%  Weight:      Height:        Constitutional: Alert awake and oriented 3. Eyes: lids and conjunctivae normal.  ENMT: Mucous membranes are moist. No oropharyngeal exudates Neck: normal, supple Respiratory: clear to auscultation bilaterally, no wheezing, no crackles. Normal respiratory effort. No accessory muscle use.  Cardiovascular: Tachycardic. Regular rhythm, no murmurs / rubs / gallops. Bilateral lower extremity edema with b/l lower legs wrapped in ACE bandages. 2+ pedal pulses.   Abdomen: slight tenderness to palpation.  Musculoskeletal: no clubbing / cyanosis. Reduced ROM of right hand with improving edema and erythema. Bilateral lower extremity edema (lower leg wrapped in ACE wraps). Deconditioned.  Skin: Improving / mild erythema of entire right arm. Skin warm and dry to touch Neurologic: No gross abnormality. Negative asterixis.  Data Reviewed: I have independently reviewed following labs and imaging studies   CBC:  Recent Labs Lab 10/22/16 0951 10/22/16 1933 10/23/16 0652 10/24/16 1413  WBC 5.5 6.1 7.4 5.3  NEUTROABS 2.7  --   --   --   HGB 9.8* 11.3* 9.3* 10.5*  HCT 28.8* 33.5* 27.2* 31.6*  MCV 104.3* 105.0* 102.6* 102.3*  PLT 169 207 192 118*   Basic Metabolic Panel:  Recent Labs Lab 10/22/16 0951 10/22/16 1933 10/23/16 0652 10/23/16 1532 10/23/16 1935 10/24/16 1413  NA 143  --  144 146*  --  145  K 4.1  --  3.5 3.2*  --  3.8  CL 113*  --  114* 118*  --  117*  CO2 22  --  21* 22  --  22  GLUCOSE 76  --  67 89  --  79  BUN 13  --  10 9  --  8  CREATININE 0.55 0.54 0.63 0.53  --  0.44  CALCIUM 7.6*  --  7.5* 7.3*  --  7.5*  MG  --   --    --   --  1.6*  --   PHOS  --   --   --   --  2.9  --    GFR: Estimated Creatinine Clearance: 97.5 mL/min (by C-G formula based on SCr of 0.44 mg/dL). Liver Function Tests:  Recent Labs Lab 10/22/16 0951 10/23/16 0652 10/23/16 1532 10/24/16 1413  AST 226* 92* 61*  62* 74*  ALT 121* 90* 72*  71* 78*  ALKPHOS 156* 138* 126 188*  BILITOT 1.1 0.9 0.9 1.0  PROT 4.4* 3.9* 3.3* 4.0*  ALBUMIN 1.4* 1.3* 1.1* 1.3*   No results for input(s): LIPASE, AMYLASE in the last 168 hours.  Recent Labs Lab 10/23/16 1935 10/25/16 0028  AMMONIA 134* 99*   Coagulation Profile:  Recent Labs Lab 10/22/16 1933 10/23/16 0652 10/23/16 1532  INR 1.07 1.27 1.29   Cardiac Enzymes: No results for input(s): CKTOTAL, CKMB, CKMBINDEX, TROPONINI in the last 168 hours. BNP (last 3 results) No results for input(s): PROBNP in the last 8760 hours. HbA1C: No results for input(s): HGBA1C in the last 72 hours. CBG: No results for input(s): GLUCAP in the last 168 hours. Lipid Profile: No results for input(s): CHOL, HDL, LDLCALC, TRIG, CHOLHDL, LDLDIRECT in the last 72 hours. Thyroid Function Tests:  Recent Labs  10/24/16 1413  TSH 3.982   Anemia Panel: No results for input(s): VITAMINB12, FOLATE, FERRITIN, TIBC, IRON, RETICCTPCT in the last 72 hours. Urine analysis:    Component Value Date/Time   COLORURINE AMBER (A) 10/15/2016 0650   APPEARANCEUR TURBID (A) 10/15/2016 0650   LABSPEC 1.038 (H) 10/15/2016 0650   PHURINE 5.0 10/15/2016 0650   GLUCOSEU NEGATIVE 10/15/2016 0650   HGBUR SMALL (A) 10/15/2016 0650   BILIRUBINUR SMALL (A) 10/15/2016 0650   KETONESUR NEGATIVE 10/15/2016 0650   PROTEINUR 100 (A) 10/15/2016 0650   UROBILINOGEN 1.0 06/18/2014 1316   NITRITE NEGATIVE 10/15/2016 0650   LEUKOCYTESUR MODERATE (A) 10/15/2016 0650   Sepsis Labs: Invalid input(s): PROCALCITONIN, LACTICIDVEN  No results found for this or any previous visit (from the past 240 hour(s)).    Radiology  Studies: Ct Humerus Right W Contrast  Result Date: 10/24/2016 CLINICAL DATA:  Right arm swelling and erythema with contracture of the right hand in the setting of a right clavicular fracture and right radial nerve palsy. Concern for infection or compartment syndrome. EXAM: CT OF THE UPPER  RIGHT EXTREMITY WITH CONTRAST TECHNIQUE: Multidetector CT imaging of the right humerus and forearm was performed according to the standard protocol following intravenous contrast administration. COMPARISON:  Right humerus x-rays dated August 23, 2016. CONTRAST:  ISOVUE-300 IOPAMIDOL (ISOVUE-300) INJECTION 61% FINDINGS: Bones/Joint/Cartilage There is a subacute, comminuted fracture of the midclavicle with approximately 1 shaft bone width posterior displacement of the distal fragment. There is surrounding callus formation. No additional fractures.  No glenohumeral or elbow joint effusion. Ligaments Suboptimally assessed by CT. Muscles and Tendons Calcification adjacent to the posterior greater tuberosity in the region of the distal infraspinatus tendon. Soft tissues Diffuse anasarca in the visualized soft tissues. No drainable fluid collection. There is mild mass effect on the right subclavian vein as it passes behind the healing clavicle fracture. The vein appears patent. No axillary lymphadenopathy.  The visualized right lung is clear. IMPRESSION: 1. Diffuse anasarca in the visualized soft tissues, including the right arm and right chest and abdominal wall. No discrete drainable fluid collection is seen. Please note that the diagnosis of compartment syndrome cannot be established by imaging. 2. Subacute, healing comminuted fracture of the mid clavicle with approximately 1 shaft bone width posterior displacement of the distal fragment. 3. Mild mass effect on the right subclavian vein as it passes behind the healing clavicle fracture. The vein appears patent. If there is clinical concern for DVT, recommend further evaluation  with ultrasound. 4. Probable calcific tendinitis of the infraspinatus tendon. Electronically Signed   By: Obie Dredge M.D.   On: 10/24/2016 08:25   Ct Forearm Right W Contrast  Result Date: 10/24/2016 CLINICAL DATA:  Right arm swelling and erythema with contracture of the right hand in the setting of a right clavicular fracture and right radial nerve palsy. Concern for infection or compartment syndrome. EXAM: CT OF THE UPPER RIGHT EXTREMITY WITH CONTRAST TECHNIQUE: Multidetector CT imaging of the right humerus and forearm was performed according to the standard protocol following intravenous contrast administration. COMPARISON:  Right humerus x-rays dated August 23, 2016. CONTRAST:  ISOVUE-300 IOPAMIDOL (ISOVUE-300) INJECTION 61% FINDINGS: Bones/Joint/Cartilage There is a subacute, comminuted fracture of the midclavicle with approximately 1 shaft bone width posterior displacement of the distal fragment. There is surrounding callus formation. No additional fractures.  No glenohumeral or elbow joint effusion. Ligaments Suboptimally assessed by CT. Muscles and Tendons Calcification adjacent to the posterior greater tuberosity in the region of the distal infraspinatus tendon. Soft tissues Diffuse anasarca in the visualized soft tissues. No drainable fluid collection. There is mild mass effect on the right subclavian vein as it passes behind the healing clavicle fracture. The vein appears patent. No axillary lymphadenopathy.  The visualized right lung is clear. IMPRESSION: 1. Diffuse anasarca in the visualized soft tissues, including the right arm and right chest and abdominal wall. No discrete drainable fluid collection is seen. Please note that the diagnosis of compartment syndrome cannot be established by imaging. 2. Subacute, healing comminuted fracture of the mid clavicle with approximately 1 shaft bone width posterior displacement of the distal fragment. 3. Mild mass effect on the right subclavian vein  as it passes behind the healing clavicle fracture. The vein appears patent. If there is clinical concern for DVT, recommend further evaluation with ultrasound. 4. Probable calcific tendinitis of the infraspinatus tendon. Electronically Signed   By: Obie Dredge M.D.   On: 10/24/2016 08:25   US Abdomen Limited Ruq  Result Date: 10/24/2016 CLINICAL DATA:  Right upper quadrant pain. EXAM: ULTRASOUND ABDOMEN LIMITED RIGHT UPPER  QUADRANT COMPARISON:  MRI 06/01/2016.  CT 06/18/2014 . FINDINGS: Gallbladder: Cholecystectomy. Common bile duct: Diameter: 6.7 mm Liver: Increased echogenicity of the liver noted consistent fatty infiltration and/or hepatocellular disease. A 1.1 cm simple cyst in the left hepatic lobe. Questionable very mild ascites. Tiny right pleural effusion. IMPRESSION: 1. Cholecystectomy.  No biliary distention. 2. Increased hepatic echogenicity consistent fatty infiltration and/or hepatocellular disease. Questions mild ascites. Small right pleural effusion. Electronically Signed   By: Maisie Fus  Register   On: 10/24/2016 10:05     Scheduled Meds: . enoxaparin (LOVENOX) injection  40 mg Subcutaneous Q24H  . lactulose  20 g Oral BID AC  . levothyroxine  125 mcg Oral QODAY  . levothyroxine  137 mcg Oral QODAY  . multivitamin with minerals  1 tablet Oral Daily  . naproxen  250 mg Oral BID WC  . pantoprazole  40 mg Oral Daily  . protein supplement shake  11 oz Oral TID BM  . sodium chloride flush  3 mL Intravenous Q12H  . thiamine  100 mg Oral Daily  . topiramate  50 mg Oral QHS   Continuous Infusions: . sodium chloride    . vancomycin Stopped (10/25/16 1355)   Author:  Lynden Oxford, MD [facility] Pager: [phone] 10/25/2016 5:57 PM     If 7PM-7AM, please contact night-coverage [email or web address] Password [email or web address]

## 2016-10-25 NOTE — Evaluation (Signed)
Occupational Therapy Evaluation Patient Details Name: Jalaiyah Throgmorton MRN: [id number] DOB: [birth year] Today's Date: 10/25/2016    History of Present Illness Pt was brought in by EMS after being found on side of road unconscious with many tablets in her mouth.  PMH:  AVN L hip, HTN, arthritis, neuromuscular disorder, subacute fx R clavicle.  Pt has swelling in RUE, bil LEs wounds/edema/cellulitis and are wrapped in [place] boots   Clinical Impression   This 41 year old female was admitted for the above. She reports that she is mod I at baseline. She was limited by pain and fatique at time of OT evaluation.Will follow her in acute setting with mod I level goals.  Pt's parents are visiting her from [place]. She has an 68 y.o. Son with autism    Follow Up Recommendations  Supervision/Assistance - 24 hour    Equipment Recommendations  None recommended by OT    Recommendations for Other Services       Precautions / Restrictions Precautions Precautions: Fall Restrictions Other Position/Activity Restrictions: Pt does not place full weight on L LE      Mobility Bed Mobility           Sit to supine: Mod assist   General bed mobility comments: assist for bil LEs; attempted to use tied sheet to self assist, but pt unable to lift legs onto bed with this  Transfers   Equipment used: Rolling walker (2 wheeled)   Sit to Stand: Min assist              Balance                                           ADL either performed or assessed with clinical judgement   ADL   Eating/Feeding: Set up   Grooming: Set up;Standing   Upper Body Bathing: Minimal assistance;Sitting   Lower Body Bathing: Moderate assistance;Sit to/from stand   Upper Body Dressing : Minimal assistance;Sitting   Lower Body Dressing: Maximal assistance;Sit to/from stand                 General ADL Comments: pt was in the bathroom washing up when I arrived. She wanted to continue to have NT  assist her. Pt uses AE at home and states she has been mod I     Financial controller      Pertinent Vitals/Pain Pain Assessment: Faces Faces Pain Scale: Hurts little more Pain Location: L hip Pain Descriptors / Indicators: Grimacing Pain Intervention(s): Limited activity within patient's tolerance;Monitored during session;Repositioned     Hand Dominance     Extremity/Trunk Assessment Upper Extremity Assessment RUE Deficits / Details: h/o subacute clavicle fx.  Pt's RUE with edema. She cannot fully make a fist but can hold to walker. Pt states clavicle does not hurt:  she did not lift arm completely           Communication Communication Communication: No difficulties   Cognition Arousal/Alertness: Awake/alert Behavior During Therapy: WFL for tasks assessed/performed                                   General Comments: pt initially guarded when answering questions.   General Comments  Exercises     Shoulder Instructions      Home Living Family/patient expects to be discharged to:: Private residence Living Arrangements: Children Available Help at Discharge: Other (Comment);Family               Bathroom Shower/Tub: Walk-in Soil scientistshower   Bathroom Toilet: Handicapped height     Home Equipment: Environmental consultantWalker - 2 wheels;Bedside commode;Shower seat;Toilet riser   Additional Comments: [place] here [place] from [place].  Pt has an 608 y.o. autistic son      Prior Functioning/Environment Level of Independence: Independent with assistive device(s)                 OT Problem List: Decreased strength;Decreased range of motion;Decreased activity tolerance;Decreased knowledge of use of DME or AE;Pain      OT Treatment/Interventions: Self-care/ADL training;DME and/or AE instruction;Patient/family education;Therapeutic activities    OT Goals(Current goals can be found in the care plan section) Acute Rehab OT Goals Patient Stated  Goal: To return home OT Goal Formulation: With patient Time For Goal Achievement: 11/01/16 Potential to Achieve Goals: Good ADL Goals Pt Will Transfer to Toilet: with modified independence;bedside commode;ambulating Additional ADL Goal #1: pt will perform adl at mod I level using ae Additional ADL Goal #2: pt will perform bed mobility at mod I level in preparation for adls  OT Frequency: Min 2X/week   Barriers to D/C:            Co-evaluation              AM-PAC PT "6 Clicks" Daily Activity     Outcome Measure Help from another person eating meals?: A Little Help from another person taking care of personal grooming?: A Little Help from another person toileting, which includes using toliet, bedpan, or urinal?: A Little Help from another person bathing (including washing, rinsing, drying)?: A Lot Help from another person to put on and taking off regular upper body clothing?: A Little Help from another person to put on and taking off regular lower body clothing?: A Lot 6 Click Score: 16   End of Session    Activity Tolerance: Patient limited by fatigue Patient left: in bed;with call bell/phone within reach  OT Visit Diagnosis: Muscle weakness (generalized) (M62.81)                Time: [id number] OT Time Calculation (min): 35 min Charges:  OT General Charges $OT Visit: 1 Procedure OT Evaluation $OT Eval Low Complexity: 1 Procedure OT Treatments $Self Care/Home Management : 8-22 mins G-Codes:     EmersonMaryellen Olvin Rohr, OTR/L [phone] 10/25/2016  Mariaha Ellington 10/25/2016, 1:55 PM

## 2016-10-26 LAB — CBC WITH DIFFERENTIAL/PLATELET
BASOS ABS: 0 10*3/uL (ref 0.0–0.1)
BASOS PCT: 1 %
Eosinophils Absolute: 0.2 10*3/uL (ref 0.0–0.7)
Eosinophils Relative: 5 %
HEMATOCRIT: 27.7 % — AB (ref 36.0–46.0)
Hemoglobin: 9.1 g/dL — ABNORMAL LOW (ref 12.0–15.0)
Lymphocytes Relative: 50 %
Lymphs Abs: 2.2 10*3/uL (ref 0.7–4.0)
MCH: 34.3 pg — ABNORMAL HIGH (ref 26.0–34.0)
MCHC: 32.9 g/dL (ref 30.0–36.0)
MCV: 104.5 fL — ABNORMAL HIGH (ref 78.0–100.0)
MONO ABS: 0.5 10*3/uL (ref 0.1–1.0)
Monocytes Relative: 12 %
NEUTROS ABS: 1.4 10*3/uL — AB (ref 1.7–7.7)
NEUTROS PCT: 32 %
Platelets: 101 10*3/uL — ABNORMAL LOW (ref 150–400)
RBC: 2.65 MIL/uL — AB (ref 3.87–5.11)
RDW: 15.1 % (ref 11.5–15.5)
WBC: 4.3 10*3/uL (ref 4.0–10.5)

## 2016-10-26 LAB — MAGNESIUM: Magnesium: 1.7 mg/dL (ref 1.7–2.4)

## 2016-10-26 LAB — COMPREHENSIVE METABOLIC PANEL
ALK PHOS: 138 U/L — AB (ref 38–126)
ALT: 46 U/L (ref 14–54)
ANION GAP: 7 (ref 5–15)
AST: 23 U/L (ref 15–41)
Albumin: 1.4 g/dL — ABNORMAL LOW (ref 3.5–5.0)
BILIRUBIN TOTAL: 0.8 mg/dL (ref 0.3–1.2)
BUN: 10 mg/dL (ref 6–20)
CALCIUM: 7.7 mg/dL — AB (ref 8.9–10.3)
CO2: 23 mmol/L (ref 22–32)
Chloride: 113 mmol/L — ABNORMAL HIGH (ref 101–111)
Creatinine, Ser: 0.48 mg/dL (ref 0.44–1.00)
GLUCOSE: 85 mg/dL (ref 65–99)
Potassium: 3.4 mmol/L — ABNORMAL LOW (ref 3.5–5.1)
Sodium: 143 mmol/L (ref 135–145)
TOTAL PROTEIN: 3.8 g/dL — AB (ref 6.5–8.1)

## 2016-10-26 LAB — TSH: TSH: 3.233 u[IU]/mL (ref 0.350–4.500)

## 2016-10-26 LAB — AMMONIA: AMMONIA: 67 umol/L — AB (ref 9–35)

## 2016-10-26 MED ORDER — FUROSEMIDE 40 MG PO TABS: 40.0000 mg | ORAL_TABLET | Freq: Every day | ORAL | Status: DC

## 2016-10-26 MED ORDER — PREGABALIN 150 MG PO CAPS: 150.0000 mg | ORAL_CAPSULE | Freq: Two times a day (BID) | ORAL | 0 refills | Status: DC

## 2016-10-26 MED ORDER — PREGABALIN 75 MG PO CAPS
150.0000 mg | ORAL_CAPSULE | Freq: Two times a day (BID) | ORAL | Status: DC
  Administered 2016-10-26: 150 mg via ORAL
  Filled 2016-10-26: qty 2

## 2016-10-26 MED ORDER — NORTRIPTYLINE HCL 75 MG PO CAPS: 75.0000 mg | ORAL_CAPSULE | Freq: Every day | ORAL | 0 refills | Status: AC

## 2016-10-26 MED ORDER — LACTULOSE 10 GM/15ML PO SOLN: 20.0000 g | Freq: Every day | ORAL | 0 refills | Status: DC

## 2016-10-26 MED ORDER — NORTRIPTYLINE HCL 25 MG PO CAPS: 75.0000 mg | ORAL_CAPSULE | Freq: Every day | ORAL | Status: DC

## 2016-10-26 MED ORDER — DOXYCYCLINE HYCLATE 100 MG PO TABS
100.0000 mg | ORAL_TABLET | Freq: Two times a day (BID) | ORAL | Status: DC
  Administered 2016-10-26: 100 mg via ORAL
  Filled 2016-10-26: qty 1

## 2016-10-26 MED ORDER — DOXYCYCLINE HYCLATE 100 MG PO TABS: 100.0000 mg | ORAL_TABLET | Freq: Two times a day (BID) | ORAL | 0 refills | Status: AC

## 2016-10-26 MED ORDER — FUROSEMIDE 40 MG PO TABS: 40.0000 mg | ORAL_TABLET | Freq: Every day | ORAL | 0 refills | Status: AC

## 2016-10-26 MED ORDER — ALUM & MAG HYDROXIDE-SIMETH 200-200-20 MG/5ML PO SUSP
30.0000 mL | Freq: Once | ORAL | Status: AC
  Administered 2016-10-26: 30 mL via ORAL
  Filled 2016-10-26: qty 30

## 2016-10-26 NOTE — Discharge Summary (Signed)
[facility] Discharge Summary   Patient: Dorothy Dyer ZOX:[id number]   PCP: Patient, No Pcp Per DOB: 1975/08/15   Date of admission: 10/22/2016   Date of discharge: 10/26/2016    Discharge Diagnoses:  Principal Problem:   Tylenol overdose Active Problems:   Anxiety   Acid reflux   Thyroid activity decreased   AVN of femur (HCC)   Bilateral lower extremity edema   Admitted From: Home Disposition:  Home with home health  Recommendations for Outpatient Follow-up:  1. Follow-up with PCP in one week.  2. Stopped taking Percocet or any other narcotic medication.  3. Please remain compliant with medical regimen on discharge.   Follow-up Information    PCP. Schedule an appointment as soon as possible for a visit in 1 week(s).          Diet recommendation: Cardiac diet  Activity: The patient is advised to gradually reintroduce usual activities.  Discharge Condition: good  Code Status: Full code  History of present illness: As per the H and P dictated on admission, "Dorothy Dyer is an 41 y.o. 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 She was noted to have an half empty Percocet bottle by her side.   Patient herself states that she did not take many tablets of Percocet. She states that she has been at home since her discharge and wanted to "do a little something for myself" so she took a trip to [employer]. She states she ran out of gas about a quarter of a mile from the [place]. She denies any intent to hurt herself. It is notable however that she told the ED physician that she had been drinking vodka and  taking pain pills to dampen the pain of her hip. Patient denies drinking to me and denies taking more than 1 Percocet.  ED Course:  The patient was noted to have elevated LFTs and there was a concern for Tylenol overdose given the Percocets she had been taking. Poison control was called and she was started on Mucomyst. She is now admitted for ongoing management."  Hospital Course:  Patient has presented in the past frequently for recurrent fall as well as multiple fractures and opiate overdose. Patient was found again with multiple tablets hydrocodone taken by herself and she was IVC She continues to deny any suicidal ideation or thoughts. TTS was consulted and recommended medical management.  Summary of her active problems in the hospital is as following. Accidental Tylenol overdose in the setting of underlying liver disease -Poison control was contacted on admission, recommended N-acetylcysteine. Per protocol, repeat Tylenol level was decreased and an acetylcysteine was discontinued on 8/13. Patient's LFTs have been improving. - Given lethargy, ammonia was checked and was found to be elevated 130. Right upper quadrant ultrasound shows that patient has potential liver disease, she was started on lactulose, continue. - She has persistently denied suicidal ideation as the cause for her Percocet ingestion  Acute hepatic and toxic encephalopathy -Likely multifactorial due to progressive congestion/elevated ammonia level/unknown amount of other home medications that she might have taken prior to admission -Getting better, patient was able to work with physical  therapy. - Continue lactulose for now, vital signs are stable and she is maintaining her oxygen sats Alert awake and oriented 3, denies any suicidal ideation. No asterixis on exam.  Right arm edema/erythema -Concerning for cellulitis, she does not have classic symptoms for compartment syndrome and she is not as tender to  palpation -CT scan of the arm showed anasarca, there was concern for a DVT as it was observed a mild mass effect in the right subclavian vein, however repeat ultrasound was negative for any deep vein thrombosis  Chronic bilateral lower extremity wounds / edema /cellulitis -Wound care consulted, vascular surgery was consulted at [facility] and did not recommend any surgical approach rather than conservative management -Wound care consulted, -Continue antibiotics for cellulitis for now  Anasarca -? Related to liver disease as well as severe malnutrition,  -A 2D echo was done which showed normal ejection fraction and no significant evidence for heart failure Given IV diuresis with albumin. Changing home Lasix dose continue when necessary Zaroxolyn.  Severe malnutrition status post gastric bypass -Consulted dietitian Recommend to continue multivitamins at home.  AVN of the left hip /recent clavicle fracture -Outpatient management  Hypothyroidism -TSH was checked last week and it was borderline elevated around 6, will need to be rechecked as an outpatient in 2-3 weeks  GERD -Continue protonix.   Fibromyalgia -Continue lyrica. Dose decreased due to lethargy as well as frequent falls.  Anxiety and Depression -Continue home nortriptyline and xanax.  Current tobacco, alcohol, and THC abuse  -Continue thiamine as needed -Counseled regarding cessation.   All other chronic medical condition were stable during the hospitalization.  Patient was seen by physical therapy, who recommended home health, which was arranged by Child psychotherapist and case Production designer, theatre/television/film. On the day of the discharge the patient's vitals were stable, and no other acute medical condition were reported by patient. the patient was felt safe to be discharge at home with home helath.  Procedures and Results:  Echocardiogram    Consultations:  TTS  DISCHARGE MEDICATION: Discharge Medication List as of 10/26/2016 12:01  PM    START taking these medications   Details  doxycycline (VIBRA-TABS) 100 MG tablet Take 1 tablet (100 mg total) by mouth every 12 (twelve) hours., Starting Thu 10/26/2016, Until Sun 10/29/2016, Normal    lactulose (CHRONULAC) 10 GM/15ML solution Take 30 mLs (20 g total) by mouth daily., Starting Thu 10/26/2016, Normal      CONTINUE these medications which have CHANGED   Details  furosemide (LASIX) 40 MG tablet Take 1 tablet (40 mg total) by mouth daily., Starting Thu 10/26/2016, Normal    nortriptyline (PAMELOR) 75 MG capsule Take 1 capsule (75 mg total) by mouth at bedtime., Starting Thu 10/26/2016, Normal    pregabalin (LYRICA) 150 MG capsule Take 1 capsule (150 mg total) by mouth 2 (two) times daily., Starting Thu 10/26/2016, Print      CONTINUE these medications which have NOT CHANGED   Details  B Complex-C (B-COMPLEX WITH VITAMIN C) tablet Take 1 tablet by mouth daily., Starting Sun 06/04/2016, Normal    calcium-vitamin D (OSCAL WITH D) 500-200 MG-UNIT tablet Take 2 tablets by mouth daily with breakfast., Starting Sun 06/04/2016, Normal    !! levothyroxine (SYNTHROID, LEVOTHROID) 125 MCG tablet TAKE 1 TABLET EVERY DAY, Normal    !! levothyroxine (SYNTHROID, LEVOTHROID) 137 MCG tablet Take 137 mcg by mouth every other day. Alternating with 125 mcg tablet., Historical Med    Magnesium Oxide 400 MG CAPS Take 1 capsule (400  mg total) by mouth 2 (two) times daily., Starting Fri 09/29/2016, No Print    metolazone (ZAROXOLYN) 5 MG tablet TAKE 1 TABLET BY MOUTH EVERY DAY 30 MINS BEFORE LASIX 1-2 TIMES PER WEEK., Normal    Multiple Vitamin (MULTIVITAMIN WITH MINERALS) TABS tablet Take 1 tablet by mouth 2 (two) times daily., Starting Sun 06/04/2016, OTC    naproxen sodium (ANAPROX) 220 MG tablet Take 1,320 mg by mouth 2 (two) times daily with a meal. , Historical Med    omega-3 acid ethyl esters (LOVAZA) 1 g capsule Take 1 capsule (1 g total) by mouth daily., Starting Sun 06/04/2016, Normal     potassium chloride SA (K-DUR,KLOR-CON) 20 MEQ tablet Take 1 tablet (20 mEq total) by mouth 2 (two) times daily as needed (when taking lasix)., Starting Sat 06/03/2016, Normal    protein supplement (UNJURY CHICKEN SOUP) POWD Take 27 g (8 oz total) by mouth 2 (two) times daily after a meal., Starting Sun 06/04/2016, OTC    RABEprazole (ACIPHEX) 20 MG tablet 1 tab po bid, Normal    simethicone (MYLICON) 80 MG chewable tablet Chew 1 tablet (80 mg total) by mouth 4 (four) times daily as needed for flatulence., Starting Fri 09/29/2016, No Print    thiamine 100 MG tablet Take 1 tablet (100 mg total) by mouth daily., Starting Sun 06/04/2016, Normal    traMADol (ULTRAM) 50 MG tablet Take 1 tablet (50 mg total) by mouth every 6 (six) hours as needed for moderate pain or severe pain., Starting Sat 06/03/2016, Print     !! - Potential duplicate medications found. Please discuss with provider.    STOP taking these medications     ALPRAZolam (XANAX) 0.5 MG tablet      cephALEXin (KEFLEX) 250 MG capsule      docusate sodium (COLACE) 100 MG capsule      topiramate (TOPAMAX) 50 MG tablet        Allergies  Allergen Reactions  . Ibuprofen Nausea Only and Other (See Comments)    Bad stomach pains  . Toradol [Ketorolac Tromethamine] Anxiety  . Aspirin Hives  . Cleocin [Clindamycin Hcl] Itching and Rash   Discharge Instructions    Diet - low sodium heart healthy    Complete by:  As directed    Discharge instructions    Complete by:  As directed    It is important that you read following instructions as well as go over your medication list with RN to help you understand your care after this hospitalization.  Discharge Instructions: Please follow-up with PCP in one week  Please request your primary care physician to go over all Hospital Tests and Procedure/Radiological results at the follow up,  Please get all Hospital records sent to your PCP by signing hospital release before you go home.    Do not drive, operating heavy machinery, perform activities at heights, swimming or participation in water activities or provide baby sitting services while you are on Pain, Sleep and Anxiety Medications; until you have been seen by Primary Care Physician or a Neurologist and advised to do so again. Do not take more than prescribed Pain, Sleep and Anxiety Medications. You were cared for by a hospitalist during your hospital stay. If you have any questions about your discharge medications or the care you received while you were in the hospital after you are discharged, you can call the unit and ask to speak with the hospitalist on call if the hospitalist that took care of you is  not available.  Once you are discharged, your primary care physician will handle any further medical issues. Please note that NO REFILLS for any discharge medications will be authorized once you are discharged, as it is imperative that you return to your primary care physician (or establish a relationship with a primary care physician if you do not have one) for your aftercare needs so that they can reassess your need for medications and monitor your lab values. You Must read complete instructions/literature along with all the possible adverse reactions/side effects for all the Medicines you take and that have been prescribed to you. Take any new Medicines after you have completely understood and accept all the possible adverse reactions/side effects. Wear Seat belts while driving. If you have smoked or chewed Tobacco in the last 2 yrs please stop smoking and/or stop any Recreational drug use.   Increase activity slowly    Complete by:  As directed      Discharge Exam: Filed Weights   10/23/16 0421 10/24/16 0440 10/26/16 0510  Weight: 95.3 kg (210 lb 1.6 oz) 95.1 kg (209 lb 10.5 oz) 94.2 kg (207 lb 10.8 oz)   Vitals:   10/26/16 0027 10/26/16 0510  BP: 112/68 112/73  Pulse: 84 71  Resp: 16 16  Temp: 98.3 F (36.8 C)  97.9 F (36.6 C)  SpO2: 97% 100%   General: Appear in no distress, no Rash; Oral Mucosa moist. Cardiovascular: S1 and S2 Present, no Murmur, no JVD Respiratory: Bilateral Air entry present and Clear to Auscultation, no Crackles, no wheezes Abdomen: Bowel Sound present, Soft and no tenderness Extremities: bilateral Pedal edema, no calf tenderness Neurology: Grossly no focal neuro deficit.  The results of significant diagnostics from this hospitalization (including imaging, microbiology, ancillary and laboratory) are listed below for reference.    Significant Diagnostic Studies: Dg Chest 1 View  Result Date: 10/14/2016 CLINICAL DATA:  Recent discharge from hospital, now unable to walk or stand independently. pt is having weakness and bilat lower extremity pain and swelling. Pt also has skin sloughing with bilat ulcerated areas to her lower legs and feet. EXAM: CHEST 1 VIEW COMPARISON:  Chest x-ray dated 09/27/2016. FINDINGS: Study is hypoinspiratory with crowding of the perihilar bronchovascular markings. Given the low lung volumes, lungs appear clear. No pleural effusion or pneumothorax seen. Heart size and mediastinal contours are normal. No acute or suspicious osseous finding. IMPRESSION: Low lung volumes. No active disease. No evidence of pneumonia or pulmonary edema. Electronically Signed   By: Bary Richard M.D.   On: 10/14/2016 17:32   Dg Chest 2 View  Result Date: 09/27/2016 CLINICAL DATA:  41 year old female with shortness of breath. Clavicle fracture a weeks ago. EXAM: CHEST  2 VIEW COMPARISON:  06/01/2016 chest radiographs. FINDINGS: Seated AP and lateral views of the chest. Midshaft right clavicle fracture with periosteal new bone and callus formation. Other visible osseous structures appear intact. Mildly larger lung volumes. The lungs appear clear. No pneumothorax or pleural effusion. Normal cardiac size and mediastinal contours. Visualized tracheal air column is within normal limits.  Negative visible bowel gas pattern. IMPRESSION: 1. Midshaft right clavicle fracture with periosteal new bone formation. 2. Otherwise negative chest. Electronically Signed   By: Odessa Fleming M.D.   On: 09/27/2016 14:03   Ct Head Wo Contrast  Result Date: 09/27/2016 CLINICAL DATA:  Fall today. Hit head. Chronic neck pain. Initial encounter. EXAM: CT HEAD WITHOUT CONTRAST CT CERVICAL SPINE WITHOUT CONTRAST TECHNIQUE: Multidetector CT imaging of the head and  cervical spine was performed following the standard protocol without intravenous contrast. Multiplanar CT image reconstructions of the cervical spine were also generated. COMPARISON:  Head CT 06/01/2016. Right clavicle radiographs 07/06/2016. FINDINGS: CT HEAD FINDINGS Brain: There is no evidence of acute infarct, intracranial hemorrhage, mass, midline shift, or extra-axial fluid collection. The ventricles and sulci are normal. Vascular: No hyperdense vessel. Skull: No fracture or focal osseous lesion. Sinuses/Orbits: Mild left sphenoid sinus mucosal thickening. Clear mastoid air cells. Unremarkable orbits. Other: None. CT CERVICAL SPINE FINDINGS Alignment: Cervical spine straightening.  No listhesis. Skull base and vertebrae: No acute fracture or destructive osseous process. Soft tissues and spinal canal: No prevertebral fluid or swelling. No visible canal hematoma. Disc levels: Minimal disc space narrowing and minimal right uncovertebral spurring at C5-6. Minimal left facet arthrosis at C3-4. No osseous spinal or neural foraminal stenosis. Upper chest: Clear lung apices. Other: The nondisplaced mid right clavicle fracture described on prior radiographs now demonstrates approximately 1.3 cm posterior displacement of the main lateral fragment as well as mild inferior displacement and overriding with surrounding callus formation and likely old hematoma. IMPRESSION: 1. No evidence of acute intracranial abnormality. 2. No cervical spine fracture or subluxation. 3.  Subacute right mid clavicle fracture with new displacement compared to 06/2016 radiographs. Electronically Signed   By: Sebastian AcheAllen  Grady M.D.   On: 09/27/2016 11:12   Ct Cervical Spine Wo Contrast  Result Date: 09/27/2016 CLINICAL DATA:  Fall today. Hit head. Chronic neck pain. Initial encounter. EXAM: CT HEAD WITHOUT CONTRAST CT CERVICAL SPINE WITHOUT CONTRAST TECHNIQUE: Multidetector CT imaging of the head and cervical spine was performed following the standard protocol without intravenous contrast. Multiplanar CT image reconstructions of the cervical spine were also generated. COMPARISON:  Head CT 06/01/2016. Right clavicle radiographs 07/06/2016. FINDINGS: CT HEAD FINDINGS Brain: There is no evidence of acute infarct, intracranial hemorrhage, mass, midline shift, or extra-axial fluid collection. The ventricles and sulci are normal. Vascular: No hyperdense vessel. Skull: No fracture or focal osseous lesion. Sinuses/Orbits: Mild left sphenoid sinus mucosal thickening. Clear mastoid air cells. Unremarkable orbits. Other: None. CT CERVICAL SPINE FINDINGS Alignment: Cervical spine straightening.  No listhesis. Skull base and vertebrae: No acute fracture or destructive osseous process. Soft tissues and spinal canal: No prevertebral fluid or swelling. No visible canal hematoma. Disc levels: Minimal disc space narrowing and minimal right uncovertebral spurring at C5-6. Minimal left facet arthrosis at C3-4. No osseous spinal or neural foraminal stenosis. Upper chest: Clear lung apices. Other: The nondisplaced mid right clavicle fracture described on prior radiographs now demonstrates approximately 1.3 cm posterior displacement of the main lateral fragment as well as mild inferior displacement and overriding with surrounding callus formation and likely old hematoma. IMPRESSION: 1. No evidence of acute intracranial abnormality. 2. No cervical spine fracture or subluxation. 3. Subacute right mid clavicle fracture with new  displacement compared to 06/2016 radiographs. Electronically Signed   By: Sebastian AcheAllen  Grady M.D.   On: 09/27/2016 11:12   Ct Humerus Right W Contrast  Result Date: 10/24/2016 CLINICAL DATA:  Right arm swelling and erythema with contracture of the right hand in the setting of a right clavicular fracture and right radial nerve palsy. Concern for infection or compartment syndrome. EXAM: CT OF THE UPPER RIGHT EXTREMITY WITH CONTRAST TECHNIQUE: Multidetector CT imaging of the right humerus and forearm was performed according to the standard protocol following intravenous contrast administration. COMPARISON:  Right humerus x-rays dated August 23, 2016. CONTRAST:  100mL ISOVUE-300 IOPAMIDOL (ISOVUE-300) INJECTION 61% FINDINGS: Bones/Joint/Cartilage  There is a subacute, comminuted fracture of the midclavicle with approximately 1 shaft bone width posterior displacement of the distal fragment. There is surrounding callus formation. No additional fractures.  No glenohumeral or elbow joint effusion. Ligaments Suboptimally assessed by CT. Muscles and Tendons Calcification adjacent to the posterior greater tuberosity in the region of the distal infraspinatus tendon. Soft tissues Diffuse anasarca in the visualized soft tissues. No drainable fluid collection. There is mild mass effect on the right subclavian vein as it passes behind the healing clavicle fracture. The vein appears patent. No axillary lymphadenopathy.  The visualized right lung is clear. IMPRESSION: 1. Diffuse anasarca in the visualized soft tissues, including the right arm and right chest and abdominal wall. No discrete drainable fluid collection is seen. Please note that the diagnosis of compartment syndrome cannot be established by imaging. 2. Subacute, healing comminuted fracture of the mid clavicle with approximately 1 shaft bone width posterior displacement of the distal fragment. 3. Mild mass effect on the right subclavian vein as it passes behind the healing  clavicle fracture. The vein appears patent. If there is clinical concern for DVT, recommend further evaluation with ultrasound. 4. Probable calcific tendinitis of the infraspinatus tendon. Electronically Signed   By: Obie Dredge M.D.   On: 10/24/2016 08:25   Ct Forearm Right W Contrast  Result Date: 10/24/2016 CLINICAL DATA:  Right arm swelling and erythema with contracture of the right hand in the setting of a right clavicular fracture and right radial nerve palsy. Concern for infection or compartment syndrome. EXAM: CT OF THE UPPER RIGHT EXTREMITY WITH CONTRAST TECHNIQUE: Multidetector CT imaging of the right humerus and forearm was performed according to the standard protocol following intravenous contrast administration. COMPARISON:  Right humerus x-rays dated August 23, 2016. CONTRAST:  ISOVUE-300 IOPAMIDOL (ISOVUE-300) INJECTION 61% FINDINGS: Bones/Joint/Cartilage There is a subacute, comminuted fracture of the midclavicle with approximately 1 shaft bone width posterior displacement of the distal fragment. There is surrounding callus formation. No additional fractures.  No glenohumeral or elbow joint effusion. Ligaments Suboptimally assessed by CT. Muscles and Tendons Calcification adjacent to the posterior greater tuberosity in the region of the distal infraspinatus tendon. Soft tissues Diffuse anasarca in the visualized soft tissues. No drainable fluid collection. There is mild mass effect on the right subclavian vein as it passes behind the healing clavicle fracture. The vein appears patent. No axillary lymphadenopathy.  The visualized right lung is clear. IMPRESSION: 1. Diffuse anasarca in the visualized soft tissues, including the right arm and right chest and abdominal wall. No discrete drainable fluid collection is seen. Please note that the diagnosis of compartment syndrome cannot be established by imaging. 2. Subacute, healing comminuted fracture of the mid clavicle with approximately 1  shaft bone width posterior displacement of the distal fragment. 3. Mild mass effect on the right subclavian vein as it passes behind the healing clavicle fracture. The vein appears patent. If there is clinical concern for DVT, recommend further evaluation with ultrasound. 4. Probable calcific tendinitis of the infraspinatus tendon. Electronically Signed   By: Obie Dredge M.D.   On: 10/24/2016 08:25   US Venous Img Lower Bilateral  Result Date: 10/15/2016 CLINICAL DATA:  Leg swelling. EXAM: BILATERAL LOWER EXTREMITY VENOUS DOPPLER ULTRASOUND TECHNIQUE: Gray-scale sonography with graded compression, as well as color Doppler and duplex ultrasound were performed to evaluate the lower extremity deep venous systems from the level of the common femoral vein and including the common femoral, femoral, profunda femoral, popliteal and calf veins  including the posterior tibial, peroneal and gastrocnemius veins when visible. The superficial great saphenous vein was also interrogated. Spectral Doppler was utilized to evaluate flow at rest and with distal augmentation maneuvers in the common femoral, femoral and popliteal veins. COMPARISON:  Knee MRI 06/01/2016 FINDINGS: RIGHT LOWER EXTREMITY Common Femoral Vein: No evidence of thrombus. Normal compressibility, respiratory phasicity and response to augmentation. Saphenofemoral Junction: No evidence of thrombus. Normal compressibility and flow on color Doppler imaging. Profunda Femoral Vein: No evidence of thrombus. Normal compressibility and flow on color Doppler imaging. Femoral Vein: No evidence of thrombus. Normal compressibility, respiratory phasicity and response to augmentation. Popliteal Vein: No evidence of thrombus. Normal compressibility, respiratory phasicity and response to augmentation. Calf Veins: No evidence of thrombus. Normal compressibility and flow on color Doppler imaging. Superficial Great Saphenous Vein: No evidence of thrombus. Normal compressibility  and flow on color Doppler imaging. Other Findings:  Subcutaneous edema. LEFT LOWER EXTREMITY Common Femoral Vein: No evidence of thrombus. Normal compressibility, respiratory phasicity and response to augmentation. Saphenofemoral Junction: No evidence of thrombus. Normal compressibility and flow on color Doppler imaging. Profunda Femoral Vein: No evidence of thrombus. Normal compressibility and flow on color Doppler imaging. Femoral Vein: No evidence of thrombus. Normal compressibility, respiratory phasicity and response to augmentation. Popliteal Vein: No evidence of thrombus. Normal compressibility, respiratory phasicity and response to augmentation. Calf Veins: No evidence of thrombus. Normal compressibility and flow on color Doppler imaging. Superficial Great Saphenous Vein: No evidence of thrombus. Normal compressibility and flow on color Doppler imaging. Other Findings:  Subcutaneous edema. IMPRESSION: No evidence of DVT within either lower extremity. Bilateral subcutaneous edema. Electronically Signed   By: Genevive Bi M.D.   On: 10/15/2016 10:55   US Venous Img Upper Uni Right  Result Date: 09/27/2016 CLINICAL DATA:  Right upper extremity swelling EXAM: RIGHT UPPER EXTREMITY VENOUS DOPPLER ULTRASOUND TECHNIQUE: Gray-scale sonography with graded compression, as well as color Doppler and duplex ultrasound were performed to evaluate the upper extremity deep venous system from the level of the subclavian vein and including the jugular, axillary, basilic, radial, ulnar and upper cephalic vein. Spectral Doppler was utilized to evaluate flow at rest and with distal augmentation maneuvers. COMPARISON:  None. FINDINGS: Contralateral Subclavian Vein: Respiratory phasicity is normal and symmetric with the symptomatic side. No evidence of thrombus. Normal compressibility. Internal Jugular Vein: No evidence of thrombus. Normal compressibility, respiratory phasicity and response to augmentation. Subclavian Vein:  No evidence of thrombus. Normal compressibility, respiratory phasicity and response to augmentation. Axillary Vein: No evidence of thrombus. Normal compressibility, respiratory phasicity and response to augmentation. Cephalic Vein: No evidence of thrombus. Normal compressibility, respiratory phasicity and response to augmentation. Basilic Vein: No evidence of thrombus. Normal compressibility, respiratory phasicity and response to augmentation. Brachial Veins: No evidence of thrombus. Normal compressibility, respiratory phasicity and response to augmentation. Radial Veins: No evidence of thrombus. Normal compressibility, respiratory phasicity and response to augmentation. Ulnar Veins: No evidence of thrombus. Normal compressibility, respiratory phasicity and response to augmentation. Venous Reflux:  None visualized. Other Findings:  None visualized. IMPRESSION: No evidence of DVT within the right upper extremity. Electronically Signed   By: Alcide Clever M.D.   On: 09/27/2016 12:05   US Abdomen Limited Ruq  Result Date: 10/24/2016 CLINICAL DATA:  Right upper quadrant pain. EXAM: ULTRASOUND ABDOMEN LIMITED RIGHT UPPER QUADRANT COMPARISON:  MRI 06/01/2016.  CT 06/18/2014 . FINDINGS: Gallbladder: Cholecystectomy. Common bile duct: Diameter: 6.7 mm Liver: Increased echogenicity of the liver noted consistent fatty infiltration and/or hepatocellular  disease. A 1.1 cm simple cyst in the left hepatic lobe. Questionable very mild ascites. Tiny right pleural effusion. IMPRESSION: 1. Cholecystectomy.  No biliary distention. 2. Increased hepatic echogenicity consistent fatty infiltration and/or hepatocellular disease. Questions mild ascites. Small right pleural effusion. Electronically Signed   By: Maisie Fus  Register   On: 10/24/2016 10:05    Microbiology: No results found for this or any previous visit (from the past 240 hour(s)).   Labs: CBC:  Recent Labs Lab 10/22/16 0951 10/22/16 1933 10/23/16 0652  10/24/16 1413 10/26/16 0911  WBC 5.5 6.1 7.4 5.3 4.3  NEUTROABS 2.7  --   --   --  1.4*  HGB 9.8* 11.3* 9.3* 10.5* 9.1*  HCT 28.8* 33.5* 27.2* 31.6* 27.7*  MCV 104.3* 105.0* 102.6* 102.3* 104.5*  PLT 169 207 192 118* 101*   Basic Metabolic Panel:  Recent Labs Lab 10/22/16 0951 10/22/16 1933 10/23/16 0652 10/23/16 1532 10/23/16 1935 10/24/16 1413 10/26/16 0911  NA 143  --  144 146*  --  145 143  K 4.1  --  3.5 3.2*  --  3.8 3.4*  CL 113*  --  114* 118*  --  117* 113*  CO2 22  --  21* 22  --  22 23  GLUCOSE 76  --  67 89  --  79 85  BUN 13  --  10 9  --  8 10  CREATININE 0.55 0.54 0.63 0.53  --  0.44 0.48  CALCIUM 7.6*  --  7.5* 7.3*  --  7.5* 7.7*  MG  --   --   --   --  1.6*  --  1.7  PHOS  --   --   --   --  2.9  --   --    Liver Function Tests:  Recent Labs Lab 10/22/16 0951 10/23/16 0652 10/23/16 1532 10/24/16 1413 10/26/16 0911  AST 226* 92* 61*  62* 74* 23  ALT 121* 90* 72*  71* 78* 46  ALKPHOS 156* 138* 126 188* 138*  BILITOT 1.1 0.9 0.9 1.0 0.8  PROT 4.4* 3.9* 3.3* 4.0* 3.8*  ALBUMIN 1.4* 1.3* 1.1* 1.3* 1.4*   No results for input(s): LIPASE, AMYLASE in the last 168 hours.  Recent Labs Lab 10/23/16 1935 10/25/16 0028 10/26/16 0911  AMMONIA 134* 99* 67*   Time spent: 40 minutes  Signed:  Jammal Sarr  [facility] 10/26/2016 , 3:07 PM

## 2016-10-26 NOTE — Evaluation (Signed)
Physical Therapy Evaluation Patient Details Name: Dorothy Dyer MRN: [id number] DOB: 1975/07/13 Today's Date: 10/26/2016   History of Present Illness  Pt was brought in by EMS after being found on side of road unconscious with many tablets in her mouth.  PMH:  AVN L hip, HTN, arthritis, neuromuscular disorder, subacute fx R clavicle.  Pt has swelling in RUE, bil LEs wounds/edema/cellulitis and are wrapped in [place] boots  Clinical Impression  Pt admitted with above diagnosis. Pt currently with functional limitations due to the deficits listed below (see PT Problem List).  Pt will benefit from skilled PT to increase their independence and safety with mobility to allow discharge to the venue listed below.  Pt assisted with short distance ambulation which was limited by need to use bathroom and L hip pain.  Pt assisted with set up for peri care (washclothes - loose BM).  Pt with possible d/c home today.  Pt reports her brother can assist her into home.       Follow Up Recommendations Home health PT;Supervision/Assistance - 24 hour    Equipment Recommendations  None recommended by PT    Recommendations for Other Services       Precautions / Restrictions Precautions Precautions: Fall      Mobility  Bed Mobility Overal bed mobility: Needs Assistance Bed Mobility: Supine to Sit     Supine to sit: Min assist;HOB elevated     General bed mobility comments: assist for trunk upright, increased time, slight assist to scoot L LE over to EOB  Transfers Overall transfer level: Needs assistance Equipment used: Rolling walker (2 wheeled) Transfers: Sit to/from Stand Sit to Stand: Min guard         General transfer comment: min/guard for safety, able rise from elevated surfaces (has elevated toilet at home as well)  Ambulation/Gait Ambulation/Gait assistance: Min guard Ambulation Distance (Feet): 40 Feet Assistive device: Rolling walker (2 wheeled) Gait Pattern/deviations: Step-to  pattern;Decreased stance time - left;Antalgic     General Gait Details: verbal cues for step to pattern due to L hip pain, toe touch/forefoot weight bearing observed, ?LLD, very slow pace - requires increased time  Information systems manager [place]in (Stroke Patients Only)       Balance                                             Pertinent Vitals/Pain Pain Assessment: 0-10 Pain Score: 4  Pain Location: L hip Pain Descriptors / Indicators: Grimacing;Aching Pain Intervention(s): Limited activity within patient's tolerance;Repositioned;Monitored during session    Home Living Family/patient expects to be discharged to:: Private residence Living Arrangements: Children Available Help at Discharge: Other (Comment);Family Type of Home: Mobile home Home Access: Stairs to enter Entrance Stairs-Rails: Left Entrance Stairs-Number of Steps: 4 Home Layout: One level Home Equipment: Walker - 2 wheels;Bedside commode;Shower seat;Toilet riser Additional Comments: parents are here visiting from [place].  Pt has an 48 y.o. autistic son    Prior Function Level of Independence: Independent with assistive device(s)         Comments: typically independent, has been using walker due to L hip pain     Hand Dominance        Extremity/Trunk Assessment   Upper Extremity Assessment Upper Extremity Assessment: Defer to OT evaluation RUE Deficits /  Details: h/o subacute clavicle fx    Lower Extremity Assessment Lower Extremity Assessment: Generalized weakness;LLE deficits/detail LLE Deficits / Details: observed toe touch weight bearing, possibly LLD due to AVN, pt reports increased pain in L hip with mobility       Communication   Communication: No difficulties  Cognition Arousal/Alertness: Awake/alert Behavior During Therapy: WFL for tasks assessed/performed Overall Cognitive Status: Within Functional Limits for tasks assessed                                         General Comments      Exercises     Assessment/Plan    PT Assessment Patient needs continued PT services  PT Problem List Decreased strength;Decreased activity tolerance;Decreased balance;Decreased mobility;Decreased knowledge of use of DME;Pain       PT Treatment Interventions DME instruction;Gait training;Stair training;Functional mobility training;Therapeutic exercise;Therapeutic activities;Balance training;Neuromuscular re-education;Patient/family education    PT Goals (Current goals can be found in the Care Plan section)  Acute Rehab PT Goals PT Goal Formulation: With patient Time For Goal Achievement: 11/09/16 Potential to Achieve Goals: Good    Frequency Min 2X/week   Barriers to discharge        Co-evaluation               AM-PAC PT "6 Clicks" Daily Activity  Outcome Measure Difficulty turning over in bed (including adjusting bedclothes, sheets and blankets)?: Total Difficulty moving from lying on back to sitting on the side of the bed? : Total Difficulty sitting down on and standing up from a chair with arms (e.g., wheelchair, bedside commode, etc,.)?: Total Help needed moving to and from a bed to chair (including a wheelchair)?: A Little Help needed walking in hospital room?: A Little Help needed climbing 3-5 steps with a railing? : A Lot 6 Click Score: 11    End of Session Equipment Utilized During Treatment: Gait belt Activity Tolerance: Patient tolerated treatment well Patient left: in chair;with call bell/phone within reach;with chair alarm set Nurse Communication: Mobility status PT Visit Diagnosis: Pain;Difficulty in walking, not elsewhere classified (R26.2) Pain - Right/Left: Left Pain - part of body: Hip    Time: 1003-1053 PT Time Calculation (min) (ACUTE ONLY): 50 min   Charges:   PT Evaluation $PT Eval Low Complexity: 1 Low PT Treatments $Gait Training: 8-22 mins   PT G Codes:         Zenovia JarredKati Timothea Bodenheimer, PT, DPT 10/26/2016 Pager: [phone]  Maida SaleLEMYRE,KATHrine E 10/26/2016, 1:26 PM

## 2016-10-26 NOTE — Progress Notes (Addendum)
Date: October 26, 2016 Chart reviewed for discharge orders: Page sent to dc'ing md for resumption of care orders for [facility]. Clide Daleshonda Laurin Morgenstern,BSN,RN3, [phone]

## 2016-10-26 NOTE — Care Management Important Message (Addendum)
Important Message  Patient Details IM Letter given to Rhonda/Case Manager to present to Patient Name: Dorothy Dyer MRN: [id number] Date of Birth: 04/04/1975   Medicare Important Message Given:  Yes    Caren MacadamFuller, Sahmir Weatherbee 10/26/2016, 10:38 AMImportant Message  Patient Details  Name: Dorothy Dyer MRN: [id number] Date of Birth: 04/19/1975   Medicare Important Message Given:  Yes    Caren MacadamFuller, Arlynn Mcdermid 10/26/2016, 10:38 AM

## 2016-10-26 NOTE — Care Management Note (Signed)
Case Management Note  Patient Details  Name: Dorothy PeersCrystal Dyer MRN: [id number] Date of Birth: 10/03/1975  Subjective/Objective:       Tylenol overdose             Action/Plan: Date:  October 26, 2016 Chart reviewed for concurrent status and case management needs. Will continue to follow patient progress. Discharge Planning: following for needs Expected discharge date: [id number] Marcelle SmilingRhonda Davis, BSN, [place]3, [place]   [phone]  Expected Discharge Date:                  Expected Discharge Plan:  Home w Home Health Services  In-House Referral:     Discharge planning Services  CM Consult  Post Acute Care Choice:    Choice offered to:  Patient  DME Arranged:    DME Agency:     HH Arranged:    HH Agency:     Status of Service:  In process, will continue to follow  If discussed at Long Length of Stay Meetings, dates discussed:    Additional Comments:  Golda AcreDavis, Rhonda Lynn, RN 10/26/2016, 8:52 AM

## 2016-10-26 NOTE — Progress Notes (Signed)
Patient is being discharged to home today. Went over discharge instructions and prescriptions with the patient. Went over medication list with patient. Patient states that she understands how she is suppose to take her medications. IV site was removed and patient changed into paper scrubs. Vicodin that was brought in was returned to patient from pharmacy and wallet. Brother to transport patient home.

## 2016-10-29 ENCOUNTER — Encounter: Payer: Self-pay | Admitting: Emergency Medicine

## 2016-10-29 ENCOUNTER — Inpatient Hospital Stay
Admission: EM | Admit: 2016-10-29 | Discharge: 2016-11-07 | DRG: 441 | Disposition: A | Discharge status: Skilled Nursing Facility | Payer: Medicare HMO | Attending: Internal Medicine | Admitting: Internal Medicine

## 2016-10-29 DIAGNOSIS — Z862 Personal history of diseases of the blood and blood-forming organs and certain disorders involving the immune mechanism: Secondary | ICD-10-CM

## 2016-10-29 DIAGNOSIS — M1990 Unspecified osteoarthritis, unspecified site: Secondary | ICD-10-CM | POA: Diagnosis present

## 2016-10-29 DIAGNOSIS — Z881 Allergy status to other antibiotic agents status: Secondary | ICD-10-CM

## 2016-10-29 DIAGNOSIS — F17210 Nicotine dependence, cigarettes, uncomplicated: Secondary | ICD-10-CM | POA: Diagnosis present

## 2016-10-29 DIAGNOSIS — Z886 Allergy status to analgesic agent status: Secondary | ICD-10-CM

## 2016-10-29 DIAGNOSIS — K7682 Hepatic encephalopathy: Secondary | ICD-10-CM | POA: Diagnosis present

## 2016-10-29 DIAGNOSIS — Z8739 Personal history of other diseases of the musculoskeletal system and connective tissue: Secondary | ICD-10-CM

## 2016-10-29 DIAGNOSIS — D649 Anemia, unspecified: Secondary | ICD-10-CM | POA: Diagnosis present

## 2016-10-29 DIAGNOSIS — W19XXXA Unspecified fall, initial encounter: Secondary | ICD-10-CM

## 2016-10-29 DIAGNOSIS — M8700 Idiopathic aseptic necrosis of unspecified bone: Secondary | ICD-10-CM | POA: Diagnosis not present

## 2016-10-29 DIAGNOSIS — K7460 Unspecified cirrhosis of liver: Secondary | ICD-10-CM | POA: Diagnosis present

## 2016-10-29 DIAGNOSIS — R601 Generalized edema: Secondary | ICD-10-CM | POA: Diagnosis present

## 2016-10-29 DIAGNOSIS — R2681 Unsteadiness on feet: Secondary | ICD-10-CM | POA: Diagnosis not present

## 2016-10-29 DIAGNOSIS — Z79899 Other long term (current) drug therapy: Secondary | ICD-10-CM

## 2016-10-29 DIAGNOSIS — I10 Essential (primary) hypertension: Secondary | ICD-10-CM | POA: Diagnosis present

## 2016-10-29 DIAGNOSIS — S098XXA Other specified injuries of head, initial encounter: Secondary | ICD-10-CM | POA: Diagnosis not present

## 2016-10-29 DIAGNOSIS — K769 Liver disease, unspecified: Secondary | ICD-10-CM | POA: Diagnosis not present

## 2016-10-29 DIAGNOSIS — R600 Localized edema: Secondary | ICD-10-CM | POA: Diagnosis not present

## 2016-10-29 DIAGNOSIS — K760 Fatty (change of) liver, not elsewhere classified: Secondary | ICD-10-CM | POA: Diagnosis present

## 2016-10-29 DIAGNOSIS — K219 Gastro-esophageal reflux disease without esophagitis: Secondary | ICD-10-CM | POA: Diagnosis present

## 2016-10-29 DIAGNOSIS — E43 Unspecified severe protein-calorie malnutrition: Secondary | ICD-10-CM | POA: Diagnosis not present

## 2016-10-29 DIAGNOSIS — Z915 Personal history of self-harm: Secondary | ICD-10-CM

## 2016-10-29 DIAGNOSIS — E039 Hypothyroidism, unspecified: Secondary | ICD-10-CM | POA: Diagnosis present

## 2016-10-29 DIAGNOSIS — G9340 Encephalopathy, unspecified: Secondary | ICD-10-CM | POA: Diagnosis present

## 2016-10-29 DIAGNOSIS — R269 Unspecified abnormalities of gait and mobility: Secondary | ICD-10-CM | POA: Diagnosis present

## 2016-10-29 DIAGNOSIS — W1839XA Other fall on same level, initial encounter: Secondary | ICD-10-CM | POA: Diagnosis present

## 2016-10-29 DIAGNOSIS — E7220 Disorder of urea cycle metabolism, unspecified: Secondary | ICD-10-CM | POA: Diagnosis present

## 2016-10-29 DIAGNOSIS — I872 Venous insufficiency (chronic) (peripheral): Secondary | ICD-10-CM | POA: Diagnosis present

## 2016-10-29 DIAGNOSIS — R531 Weakness: Secondary | ICD-10-CM

## 2016-10-29 DIAGNOSIS — Z791 Long term (current) use of non-steroidal anti-inflammatories (NSAID): Secondary | ICD-10-CM

## 2016-10-29 DIAGNOSIS — G709 Myoneural disorder, unspecified: Secondary | ICD-10-CM | POA: Diagnosis present

## 2016-10-29 DIAGNOSIS — Z9884 Bariatric surgery status: Secondary | ICD-10-CM

## 2016-10-29 DIAGNOSIS — R1084 Generalized abdominal pain: Secondary | ICD-10-CM | POA: Diagnosis present

## 2016-10-29 DIAGNOSIS — M810 Age-related osteoporosis without current pathological fracture: Secondary | ICD-10-CM | POA: Diagnosis present

## 2016-10-29 DIAGNOSIS — F1910 Other psychoactive substance abuse, uncomplicated: Secondary | ICD-10-CM | POA: Diagnosis present

## 2016-10-29 DIAGNOSIS — K7200 Acute and subacute hepatic failure without coma: Secondary | ICD-10-CM | POA: Diagnosis present

## 2016-10-29 DIAGNOSIS — S0010XA Contusion of unspecified eyelid and periocular area, initial encounter: Secondary | ICD-10-CM | POA: Diagnosis not present

## 2016-10-29 DIAGNOSIS — X58XXXA Exposure to other specified factors, initial encounter: Secondary | ICD-10-CM | POA: Diagnosis present

## 2016-10-29 DIAGNOSIS — F1010 Alcohol abuse, uncomplicated: Secondary | ICD-10-CM | POA: Diagnosis present

## 2016-10-29 DIAGNOSIS — G8929 Other chronic pain: Secondary | ICD-10-CM | POA: Diagnosis present

## 2016-10-29 DIAGNOSIS — Z888 Allergy status to other drugs, medicaments and biological substances status: Secondary | ICD-10-CM

## 2016-10-29 LAB — COMPREHENSIVE METABOLIC PANEL
ALT: 47 U/L (ref 14–54)
AST: 35 U/L (ref 15–41)
Albumin: 1.6 g/dL — ABNORMAL LOW (ref 3.5–5.0)
Alkaline Phosphatase: 150 U/L — ABNORMAL HIGH (ref 38–126)
Anion gap: 5 (ref 5–15)
BILIRUBIN TOTAL: 0.9 mg/dL (ref 0.3–1.2)
BUN: 17 mg/dL (ref 6–20)
CALCIUM: 8.1 mg/dL — AB (ref 8.9–10.3)
CO2: 25 mmol/L (ref 22–32)
CREATININE: 0.61 mg/dL (ref 0.44–1.00)
Chloride: 111 mmol/L (ref 101–111)
Glucose, Bld: 78 mg/dL (ref 65–99)
Potassium: 3.7 mmol/L (ref 3.5–5.1)
Sodium: 141 mmol/L (ref 135–145)
TOTAL PROTEIN: 4.4 g/dL — AB (ref 6.5–8.1)

## 2016-10-29 LAB — CBC
HEMATOCRIT: 31 % — AB (ref 35.0–47.0)
Hemoglobin: 10.4 g/dL — ABNORMAL LOW (ref 12.0–16.0)
MCH: 35.2 pg — AB (ref 26.0–34.0)
MCHC: 33.7 g/dL (ref 32.0–36.0)
MCV: 104.4 fL — AB (ref 80.0–100.0)
Platelets: 162 10*3/uL (ref 150–440)
RBC: 2.96 MIL/uL — AB (ref 3.80–5.20)
RDW: 15 % — ABNORMAL HIGH (ref 11.5–14.5)
WBC: 7.5 10*3/uL (ref 3.6–11.0)

## 2016-10-29 LAB — CK: CK TOTAL: 300 U/L — AB (ref 38–234)

## 2016-10-29 MED ORDER — SODIUM CHLORIDE 0.9 % IV SOLN
1000.0000 mL | Freq: Once | INTRAVENOUS | Status: AC
  Administered 2016-10-29: 1000 mL via INTRAVENOUS

## 2016-10-29 NOTE — ED Provider Notes (Signed)
[facility] Emergency Department Provider Note   ____________________________________________    I have reviewed the triage vital signs and the nursing notes.   HISTORY  Chief Complaint Fall     HPI Dorothy Dyer is a 41 y.o. 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 Patient reports her walker got stuck and she fell forward, she felt too weak to get up and so she was lying on the floor for approximately 2 hours. Recently discharged from [facility] after ? Accidental versus intentional drug overdose. Patient denies injury from fall. No neck pain. No chest pain. No abdominal pain. No change in chronic right hip pain. No dizziness. No head injury.   Past Medical History:  Diagnosis Date  . Abdominal pain, chronic, generalized   . Acid reflux   . Anemia   . Anxiety   . Arthritis   . AVN of femur (HCC)    left hip  . Clotting disorder (HCC)    undetermined  . Cystitis   . 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 Hypertension   . Neuromuscular disorder (HCC)   . Osteoporosis   . Psoriasis   . Thyroid disease     Patient Active Problem List   Diagnosis Date Noted  . Tylenol overdose 10/22/2016  . Pressure injury of skin 10/15/2016  . Bilateral lower leg cellulitis 10/14/2016  . Leg swelling 10/14/2016  . Pain 09/27/2016  . Clavicular fracture 09/27/2016  . Abdominal pain, chronic, generalized   . Protein-calorie malnutrition, severe 06/02/2016  . Acute encephalopathy 06/01/2016  . Chronic left hip pain 06/01/2016  . Overdose of opiate or related narcotic, accidental or unintentional, initial encounter 06/01/2016  . Lower extremity cellulitis 06/01/2016  . 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 Left knee pain 06/01/2016  . Bilateral lower extremity edema 06/01/2016    . Acute hypokalemia 02/11/2016  . Hypoglycemia 02/11/2016  . Marijuana abuse 02/11/2016  . Chronic narcotic use 02/11/2016  . AKI (acute kidney injury) (HCC) 02/10/2016  . AVN of femur (HCC)   . 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 Hoarseness 06/17/2014  . 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 Fibromyalgia 06/17/2014  . Chronic abdominal pain 06/17/2014  . Knee pain, bilateral 06/17/2014  . Vitamin D deficiency 06/17/2014  . Vitamin B12 deficiency 06/17/2014  . Iron deficiency anemia 06/17/2014  . Thyroid activity decreased 06/17/2014  . Abnormal Pap smear of cervix 06/17/2014  . Anemia   . Anxiety   . Arthritis   . Acid reflux   . Neuromuscular disorder (HCC)   . Hypertension   . Osteoporosis   . HPV (human papilloma virus) infection     Past Surgical History:  Procedure Laterality Date  . CHOLECYSTECTOMY    . ESOPHAGOGASTRODUODENOSCOPY  10/2014   at [facility], was normal. Dr. Merri Brunette  . GASTRIC BYPASS  05/2001  . SMALL INTESTINE SURGERY  2004   exploratory    Prior to Admission medications   Medication Sig Start Date End Date Taking? Authorizing Provider  ALPRAZolam Prudy Feeler) 0.5 MG tablet Take 1 tablet by mouth 2 (two) times daily as needed. 09/21/16  Yes [provider]  doxycycline (VIBRA-TABS) 100 MG tablet Take 1 tablet (100 mg total) by mouth every 12 (twelve) hours. 10/26/16 10/29/16 Yes Rolly Salter, MD  furosemide (LASIX) 40 MG tablet Take  1 tablet (40 mg total) by mouth daily. 10/26/16  Yes Rolly Salter, MD  lactulose (CHRONULAC) 10 GM/15ML solution Take 30 mLs (20 g total) by mouth daily. 10/26/16  Yes Rolly Salter, MD  levothyroxine (SYNTHROID, LEVOTHROID) 125 MCG tablet TAKE 1 TABLET EVERY DAY Patient taking differently: Take 125 mcg by mouth every other day, alternating with 137 mcg tablet. 11/25/15  Yes Donita Brooks, MD  levothyroxine (SYNTHROID, LEVOTHROID) 137 MCG tablet Take 137 mcg by mouth every other day. Alternating with 125 mcg tablet.   Yes  [provider]  LYRICA 225 MG capsule Take 1 capsule by mouth 2 (two) times daily. 10/05/16  Yes [provider]  metolazone (ZAROXOLYN) 5 MG tablet TAKE 1 TABLET BY MOUTH EVERY DAY 30 MINS BEFORE LASIX 1-2 TIMES PER WEEK. 09/05/16  Yes Donita Brooks, MD  Multiple Vitamin (MULTIVITAMIN WITH MINERALS) TABS tablet Take 1 tablet by mouth 2 (two) times daily. 06/04/16  Yes Hongalgi, Maximino Greenland, MD  naproxen sodium (ANAPROX) 220 MG tablet Take 1,320 mg by mouth 2 (two) times daily with a meal.    Yes [provider]  nortriptyline (PAMELOR) 75 MG capsule Take 1 capsule (75 mg total) by mouth at bedtime. 10/26/16  Yes Rolly Salter, MD  omega-3 acid ethyl esters (LOVAZA) 1 g capsule Take 1 capsule (1 g total) by mouth daily. 06/04/16  Yes Hongalgi, Maximino Greenland, MD  potassium chloride SA (K-DUR,KLOR-CON) 20 MEQ tablet Take 1 tablet (20 mEq total) by mouth 2 (two) times daily as needed (when taking lasix). Patient taking differently: Take 20 mEq by mouth daily.  06/03/16  Yes Hongalgi, Maximino Greenland, MD  RABEprazole (ACIPHEX) 20 MG tablet 1 tab po bid Patient taking differently: Take 20 mg by mouth daily.  06/29/16  Yes Donita Brooks, MD  simethicone (MYLICON) 80 MG chewable tablet Chew 1 tablet (80 mg total) by mouth 4 (four) times daily as needed for flatulence. 09/29/16  Yes Auburn Bilberry, MD  traMADol (ULTRAM) 50 MG tablet Take 1 tablet (50 mg total) by mouth every 6 (six) hours as needed for moderate pain or severe pain. 06/03/16  Yes Hongalgi, Maximino Greenland, MD  B Complex-C (B-COMPLEX WITH VITAMIN C) tablet Take 1 tablet by mouth daily. Patient not taking: Reported on 09/27/2016 06/04/16   Elease Etienne, MD  calcium-vitamin D (OSCAL WITH D) 500-200 MG-UNIT tablet Take 2 tablets by mouth daily with breakfast. Patient not taking: Reported on 09/27/2016 06/04/16   Elease Etienne, MD  Magnesium Oxide 400 MG CAPS Take 1 capsule (400 mg total) by mouth 2 (two) times daily. Patient not  taking: Reported on 10/22/2016 09/29/16   Auburn Bilberry, MD  oxyCODONE-acetaminophen (PERCOCET/ROXICET) 5-325 MG tablet Take 1 tablet by mouth every 6 (six) hours as needed. 09/29/16   [provider]  protein supplement (UNJURY CHICKEN SOUP) POWD Take 27 g (8 oz total) by mouth 2 (two) times daily after a meal. Patient not taking: Reported on 09/27/2016 06/04/16   Elease Etienne, MD  thiamine 100 MG tablet Take 1 tablet (100 mg total) by mouth daily. Patient not taking: Reported on 10/22/2016 06/04/16   Elease Etienne, MD     Allergies Ibuprofen; Toradol [ketorolac tromethamine]; Aspirin; and Cleocin [clindamycin hcl]  Family History  Problem Relation Age of Onset  . Depression Mother   . Diabetes Mother   . Hyperlipidemia Mother   . Depression Father   . Hearing loss Father   .  Hyperlipidemia Father   . Hypertension Father   . Learning disabilities Father   . Mental illness Father   . Asthma Brother   . Alcohol abuse Maternal Grandmother   . Arthritis Maternal Grandmother   . Cancer Maternal Grandmother   . Cancer Maternal Grandfather   . Mental illness Paternal Grandmother   . Heart disease Paternal Grandfather   . Stroke Paternal Grandfather     Social History Social History  Substance Use Topics  . Smoking status: Current Every Day Smoker    Packs/day: 0.50    Types: Cigarettes  . Smokeless tobacco: Never Used  . Alcohol use 6.0 oz/week    10 Shots of liquor per week     Comment: once a week    Review of Systems  Constitutional: No fever/chills Eyes: No visual changes. Does report swelling around her eyes ENT: No sore throat. Cardiovascular: Denies chest pain. Respiratory: Denies shortness of breath. Gastrointestinal: No abdominal pain.  No nausea, no vomiting.   Genitourinary: Negative for dysuria. Musculoskeletal: Chronic right hip pain Skin: Negative for rash. Neurological: Negative for headaches     ____________________________________________   PHYSICAL EXAM:  VITAL SIGNS: ED Triage Vitals  Enc Vitals Group     BP 10/29/16 1759 (!) 100/59     Pulse --      Resp 10/29/16 1759 18     Temp 10/29/16 1759 (!) 97.5 F (36.4 C)     Temp Source 10/29/16 1759 Oral     SpO2 10/29/16 1755 98 %     Weight --      Height --      Head Circumference --      Peak Flow --      Pain Score 10/29/16 1758 8     Pain Loc --      Pain Edu? --      Excl. in GC? --     Constitutional: Alert and oriented. No acute distress.  Eyes: Conjunctivae are normal.  Head: Atraumatic. Nose: No congestion/rhinnorhea. Mouth/Throat: Mucous membranes are moist.   Neck:  Painless ROM, No vertebral tenderness to palpation  Cardiovascular: Normal rate, regular rhythm. Grossly normal heart sounds.  Good peripheral circulation. Respiratory: Normal respiratory effort.  No retractions. Lungs CTAB. Gastrointestinal: Soft and nontender. No distention.  Genitourinary: deferred Musculoskeletal: Lower extremity edema, bilaterally, no evidence of infectious cellulitis at this time..  Warm and well perfused Neurologic:  Normal speech and language. No gross focal neurologic deficits are appreciated.  Skin:  Skin is warm, dry and intact. No rash noted. Psychiatric: Mood and affect are normal. Speech and behavior are normal.  ____________________________________________   LABS (all labs ordered are listed, but only abnormal results are displayed)  Labs Reviewed  CBC - Abnormal; Notable for the following:       Result Value   RBC 2.96 (*)    Hemoglobin 10.4 (*)    HCT 31.0 (*)    MCV 104.4 (*)    MCH 35.2 (*)    RDW 15.0 (*)    All other components within normal limits  COMPREHENSIVE METABOLIC PANEL - Abnormal; Notable for the following:    Calcium 8.1 (*)    Total Protein 4.4 (*)    Albumin 1.6 (*)    Alkaline Phosphatase 150 (*)    All other components within normal limits  CK - Abnormal; Notable for  the following:    Total CK 300 (*)    All other components within normal limits  ____________________________________________  EKG  None ____________________________________________  RADIOLOGY  None ____________________________________________   PROCEDURES  Procedure(s) performed: No    Critical Care performed: No ____________________________________________   INITIAL IMPRESSION / ASSESSMENT AND PLAN / ED COURSE  Pertinent labs & imaging results that were available during my care of the patient were reviewed by me and considered in my medical decision making (see chart for details).  Patient presents after a fall and was too weak to get up. I will check a CK, labs and monitor in the department ----------------------------------------- 11:00 PM on 10/29/2016 ----------------------------------------- Patient remains quite weak and do not feel she is safe for discharge have discussed with Dr. Emmit Pomfret who will evaluate the patient    ____________________________________________   FINAL CLINICAL IMPRESSION(S) / ED DIAGNOSES  Final diagnoses:  Fall, initial encounter  Weakness      NEW MEDICATIONS STARTED DURING THIS VISIT:  New Prescriptions   No medications on file     Note:  This document was prepared using Dragon voice recognition software and may include unintentional dictation errors.    Jene Every, MD 10/29/16 2306

## 2016-10-29 NOTE — ED Triage Notes (Signed)
Patient arrived from home with co fall from her Dorothy Dyer that EMS described as a "gradual descent" to the floor, and she was found on the left side down.  Patient is complaining of left sided pain that she is unable to describe with generalized weakness.  She lives at home but according to EMS is awaiting "field placement"  She was just seen at [facility] and dc on the 16th according to EMS.  EMS EKG was NSR and pt has new right side eye swelling that she is unsure of the origin.  According to EMS patient is on diuretics but has not been compliant with therapy, generalized edema noted throughout.

## 2016-10-29 NOTE — H&P (Signed)
History and Physical   [facility] [place] @ [facility] Admission History and Physical [employer], D.O.    Patient Name: Dorothy Dyer MR#: [id number] Date of Birth: January 09, 1976 Date of Admission: 10/29/2016  Referring MD/NP/PA: Dr. Cyril Loosen Primary Care Physician: Patient, No Pcp Per  Chief Complaint:  Chief Complaint  Patient presents with  . Fall    HPI: Dorothy Dyer is a 41 y.o. female with a known history of anxiety, hypertension, neuromuscular disorder, hypothyroidism,GERD, IBS, chronic lower extremity edema with stasis dermatitis osteoporosis and AVN of the right hip presents to the emergency department for evaluation of weakness and fall.  Patient was in a usual state of health until she slowly fell to the floor while using her walker and landed on her left side. She described generalized weakness that prohibited her from getting back to standing position. She was on the floor for about five hours. Denies head trauma or loss of consciousness. Patient lives alone and has no assistance.  Patient denies fevers/chills, dizziness, chest pain, shortness of breath, N/V/C/D, abdominal pain, dysuria/frequency, changes in mental status.    Of note patient was hospitalized on 10/22/2016 for Tylenol overdose after taking an unknown quantity of Percocet as well as drinking alcohol and a possible suicide attempt.  At that time she was found to have acute hepatic and toxic encephalopathy, anasarca related to liver disease and severe  Protein malnutrition. she was discharged on 10/26/2016. Apparently she has had multiple hospital admissions for falls, fractures and opiate overdoses.  EMS/ED Course: Patient received NS. Medical admission has been requested for further management of generalized weakness, unsafe discharge.  Review of Systems:  CONSTITUTIONAL:Positive fatigue, weakness, No weight gain/loss, headache. EYES: No blurry or double vision. ENT: No tinnitus, postnasal drip,  redness or soreness of the oropharynx. RESPIRATORY: No cough, dyspnea, wheeze.  No hemoptysis.  CARDIOVASCULAR: No chest pain, palpitations, syncope, orthopnea. No lower extremity edema.  GASTROINTESTINAL: No nausea, vomiting, abdominal pain, diarrhea, constipation.  No hematemesis, melena or hematochezia. GENITOURINARY: No dysuria, frequency, hematuria. ENDOCRINE: No polyuria or nocturia. No heat or cold intolerance. HEMATOLOGY: No anemia, bruising, bleeding. INTEGUMENTARY: No rashes, ulcers, lesions. MUSCULOSKELETAL: No arthritis, gout, dyspnea. NEUROLOGIC: No numbness, tingling, ataxia, seizure-type activity, weakness. PSYCHIATRIC: No anxiety, depression, insomnia.   Past Medical History:  Diagnosis Date  . Abdominal pain, chronic, generalized   . Acid reflux   . Anemia   . Anxiety   . Arthritis   . AVN of femur (HCC)    left hip  . Clotting disorder (HCC)    undetermined  . Cystitis   . 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 Hypertension   . Neuromuscular disorder (HCC)   . Osteoporosis   . Psoriasis   . Thyroid disease     Past Surgical History:  Procedure Laterality Date  . CHOLECYSTECTOMY    . ESOPHAGOGASTRODUODENOSCOPY  10/2014   at [facility], was normal. Dr. Merri Brunette  . GASTRIC BYPASS  05/2001  . SMALL INTESTINE SURGERY  2004   exploratory     reports that she has been smoking Cigarettes.  She has been smoking about 0.50 packs per day. She has never used smokeless tobacco. She reports that she drinks about 6.0 oz of alcohol per week . She reports that she does not use drugs.  Allergies  Allergen Reactions  . Ibuprofen Nausea Only and Other (See Comments)    Bad stomach pains  . Toradol [Ketorolac Tromethamine] Anxiety  . Aspirin Hives  . Cleocin [Clindamycin  Hcl] Itching and Rash    Family History  Problem Relation Age of Onset  . Depression Mother   . Diabetes Mother   . Hyperlipidemia Mother   . Depression Father   . Hearing loss Father   .  Hyperlipidemia Father   . Hypertension Father   . Learning disabilities Father   . Mental illness Father   . Asthma Brother   . Alcohol abuse Maternal Grandmother   . Arthritis Maternal Grandmother   . Cancer Maternal Grandmother   . Cancer Maternal Grandfather   . Mental illness Paternal Grandmother   . Heart disease Paternal Grandfather   . Stroke Paternal Grandfather     Prior to Admission medications   Medication Sig Start Date End Date Taking? Authorizing Provider  ALPRAZolam Prudy Feeler) 0.5 MG tablet Take 1 tablet by mouth 2 (two) times daily as needed. 09/21/16  Yes [provider]  doxycycline (VIBRA-TABS) 100 MG tablet Take 1 tablet (100 mg total) by mouth every 12 (twelve) hours. 10/26/16 10/29/16 Yes Rolly Salter, MD  furosemide (LASIX) 40 MG tablet Take 1 tablet (40 mg total) by mouth daily. 10/26/16  Yes Rolly Salter, MD  lactulose (CHRONULAC) 10 GM/15ML solution Take 30 mLs (20 g total) by mouth daily. 10/26/16  Yes Rolly Salter, MD  levothyroxine (SYNTHROID, LEVOTHROID) 125 MCG tablet TAKE 1 TABLET EVERY DAY Patient taking differently: Take 125 mcg by mouth every other day, alternating with 137 mcg tablet. 11/25/15  Yes Donita Brooks, MD  levothyroxine (SYNTHROID, LEVOTHROID) 137 MCG tablet Take 137 mcg by mouth every other day. Alternating with 125 mcg tablet.   Yes [provider]  LYRICA 225 MG capsule Take 1 capsule by mouth 2 (two) times daily. 10/05/16  Yes [provider]  metolazone (ZAROXOLYN) 5 MG tablet TAKE 1 TABLET BY MOUTH EVERY DAY 30 MINS BEFORE LASIX 1-2 TIMES PER WEEK. 09/05/16  Yes Donita Brooks, MD  Multiple Vitamin (MULTIVITAMIN WITH MINERALS) TABS tablet Take 1 tablet by mouth 2 (two) times daily. 06/04/16  Yes Hongalgi, Maximino Greenland, MD  naproxen sodium (ANAPROX) 220 MG tablet Take 1,320 mg by mouth 2 (two) times daily with a meal.    Yes [provider]  nortriptyline (PAMELOR) 75 MG capsule Take 1 capsule (75 mg  total) by mouth at bedtime. 10/26/16  Yes Rolly Salter, MD  omega-3 acid ethyl esters (LOVAZA) 1 g capsule Take 1 capsule (1 g total) by mouth daily. 06/04/16  Yes Hongalgi, Maximino Greenland, MD  potassium chloride SA (K-DUR,KLOR-CON) 20 MEQ tablet Take 1 tablet (20 mEq total) by mouth 2 (two) times daily as needed (when taking lasix). Patient taking differently: Take 20 mEq by mouth daily.  06/03/16  Yes Hongalgi, Maximino Greenland, MD  RABEprazole (ACIPHEX) 20 MG tablet 1 tab po bid Patient taking differently: Take 20 mg by mouth daily.  06/29/16  Yes Donita Brooks, MD  simethicone (MYLICON) 80 MG chewable tablet Chew 1 tablet (80 mg total) by mouth 4 (four) times daily as needed for flatulence. 09/29/16  Yes Auburn Bilberry, MD  traMADol (ULTRAM) 50 MG tablet Take 1 tablet (50 mg total) by mouth every 6 (six) hours as needed for moderate pain or severe pain. 06/03/16  Yes Hongalgi, Maximino Greenland, MD  B Complex-C (B-COMPLEX WITH VITAMIN C) tablet Take 1 tablet by mouth daily. Patient not taking: Reported on 09/27/2016 06/04/16   Elease Etienne, MD  calcium-vitamin D (OSCAL WITH D) 500-200 MG-UNIT tablet Take  2 tablets by mouth daily with breakfast. Patient not taking: Reported on 09/27/2016 06/04/16   Elease Etienne, MD  Magnesium Oxide 400 MG CAPS Take 1 capsule (400 mg total) by mouth 2 (two) times daily. Patient not taking: Reported on 10/22/2016 09/29/16   Auburn Bilberry, MD  oxyCODONE-acetaminophen (PERCOCET/ROXICET) 5-325 MG tablet Take 1 tablet by mouth every 6 (six) hours as needed. 09/29/16   [provider]  protein supplement (UNJURY CHICKEN SOUP) POWD Take 27 g (8 oz total) by mouth 2 (two) times daily after a meal. Patient not taking: Reported on 09/27/2016 06/04/16   Elease Etienne, MD  thiamine 100 MG tablet Take 1 tablet (100 mg total) by mouth daily. Patient not taking: Reported on 10/22/2016 06/04/16   Elease Etienne, MD    Physical Exam: Vitals:   10/29/16 1755 10/29/16 1759 10/29/16  1900 10/29/16 2044  BP:  (!) 100/59 (!) 95/50 (!) 100/53  Pulse:   87 87  Resp:  18  18  Temp:  (!) 97.5 F (36.4 C)    TempSrc:  Oral    SpO2: 98% 98% 99% 99%    GENERAL: 41 y.o.-year-old female patient, chronically ill appearing, lying in the bed in no acute distress.  Pleasant and cooperative.   HEENT: Head atraumatic, normocephalic. Pupils equal. Mucus membranes dry. Eyelids mildly swollen bilaterally.  NECK: Supple, full range of motion. No JVD, no bruit heard. No thyroid enlargement, no tenderness, no cervical lymphadenopathy. CHEST: Normal breath sounds bilaterally. No wheezing, rales, rhonchi or crackles. No use of accessory muscles of respiration.  No reproducible chest wall tenderness.  CARDIOVASCULAR: S1, S2 normal. No murmurs, rubs, or gallops. Cap refill <2 seconds. Pulses intact distally.  ABDOMEN: Soft, nondistended, nontender. No rebound, guarding, rigidity. Normoactive bowel sounds present in all four quadrants.  EXTREMITIES: No pedal edema, cyanosis, or clubbing. No calf tenderness or Homan's sign. Flaking of the skin on the hands bilaterally.  NEUROLOGIC: The patient is alert and oriented x 3. Cranial nerves II through XII are grossly intact with no focal sensorimotor deficit. PSYCHIATRIC:  Normal affect, mood, thought content.    Labs on Admission:  CBC:  Recent Labs Lab 10/23/16 0652 10/24/16 1413 10/26/16 0911 10/29/16 1831  WBC 7.4 5.3 4.3 7.5  NEUTROABS  --   --  1.4*  --   HGB 9.3* 10.5* 9.1* 10.4*  HCT 27.2* 31.6* 27.7* 31.0*  MCV 102.6* 102.3* 104.5* 104.4*  PLT 192 118* 101* 162   Basic Metabolic Panel:  Recent Labs Lab 10/23/16 0652 10/23/16 1532 10/23/16 1935 10/24/16 1413 10/26/16 0911 10/29/16 1831  NA 144 146*  --  145 143 141  K 3.5 3.2*  --  3.8 3.4* 3.7  CL 114* 118*  --  117* 113* 111  CO2 21* 22  --  22 23 25   GLUCOSE 67 89  --  79 85 78  BUN 10 9  --  8 10 17   CREATININE 0.63 0.53  --  0.44 0.48 0.61  CALCIUM 7.5* 7.3*  --   7.5* 7.7* 8.1*  MG  --   --  1.6*  --  1.7  --   PHOS  --   --  2.9  --   --   --    GFR: Estimated Creatinine Clearance: 97 mL/min (by C-G formula based on SCr of 0.61 mg/dL). Liver Function Tests:  Recent Labs Lab 10/23/16 0652 10/23/16 1532 10/24/16 1413 10/26/16 0911 10/29/16 1831  AST 92* 61*  62* 74* 23 35  ALT 90* 72*  71* 78* 46 47  ALKPHOS 138* 126 188* 138* 150*  BILITOT 0.9 0.9 1.0 0.8 0.9  PROT 3.9* 3.3* 4.0* 3.8* 4.4*  ALBUMIN 1.3* 1.1* 1.3* 1.4* 1.6*   No results for input(s): LIPASE, AMYLASE in the last 168 hours.  Recent Labs Lab 10/23/16 1935 10/25/16 0028 10/26/16 0911  AMMONIA 134* 99* 67*   Coagulation Profile:  Recent Labs Lab 10/23/16 0652 10/23/16 1532  INR 1.27 1.29   Cardiac Enzymes:  Recent Labs Lab 10/29/16 1831  CKTOTAL 300*   BNP (last 3 results) No results for input(s): PROBNP in the last 8760 hours. HbA1C: No results for input(s): HGBA1C in the last 72 hours. CBG: No results for input(s): GLUCAP in the last 168 hours. Lipid Profile: No results for input(s): CHOL, HDL, LDLCALC, TRIG, CHOLHDL, LDLDIRECT in the last 72 hours. Thyroid Function Tests: No results for input(s): TSH, T4TOTAL, FREET4, T3FREE, THYROIDAB in the last 72 hours. Anemia Panel: No results for input(s): VITAMINB12, FOLATE, FERRITIN, TIBC, IRON, RETICCTPCT in the last 72 hours. Urine analysis:    Component Value Date/Time   COLORURINE AMBER (A) 10/15/2016 0650   APPEARANCEUR TURBID (A) 10/15/2016 0650   LABSPEC 1.038 (H) 10/15/2016 0650   PHURINE 5.0 10/15/2016 0650   GLUCOSEU NEGATIVE 10/15/2016 0650   HGBUR SMALL (A) 10/15/2016 0650   BILIRUBINUR SMALL (A) 10/15/2016 0650   KETONESUR NEGATIVE 10/15/2016 0650   PROTEINUR 100 (A) 10/15/2016 0650   UROBILINOGEN 1.0 06/18/2014 1316   NITRITE NEGATIVE 10/15/2016 0650   LEUKOCYTESUR MODERATE (A) 10/15/2016 0650   Sepsis Labs: @LABRCNTIP (procalcitonin:4,lacticidven:4) )No results found for this or  any previous visit (from the past 240 hour(s)).   Radiological Exams on Admission: No results found.  Assessment/Plan  This is a 41 y.o. female with a history of anxiety, hypertension, neuromuscular disorder, hypothyroidism,GERD, IBS, chronic lower extremity edema with stasis dermatitis osteoporosis and AVN of the right hip now being admitted with:  #. Weakness, gait instability - Admit observation - PT and social work eval as patient lives alone and likely needs assistance - Check UDS, UA, ammonia  #. History of AVN of the hip - Pain control  #. Anemia, chronic and stable - Monitor CBC  #. Severe protein malnutrition - Dietary consult  #. History of liver disease - Continue Lasix, lactulose - Check PT/INR - Consider GI consult  #. History of hypothyroidism - Continue Synthroid  #. History of GERD - Continue Protonix  Admission status: Inpatient IV Fluids: HL Diet/Nutrition: Regular Consults called: Dietary  DVT Px: Lovenox, SCDs and early ambulation. Code Status: Full Code  Disposition Plan: To be determined   All the records are reviewed and case discussed with ED provider. Management plans discussed with the patient and/or family who express understanding and agree with plan of care.  Jamarius Saha D.O. on 10/29/2016 at 11:24 PM Between 7am to 6pm - Pager - [phone] After 6pm go to [email or web address] - password [email or web address] [facility] [place] [facility] Office [phone] CC: Primary care physician; Patient, No Pcp Per   10/29/2016, 11:24 PM

## 2016-10-30 DIAGNOSIS — D649 Anemia, unspecified: Secondary | ICD-10-CM | POA: Diagnosis not present

## 2016-10-30 DIAGNOSIS — R2681 Unsteadiness on feet: Secondary | ICD-10-CM | POA: Diagnosis not present

## 2016-10-30 DIAGNOSIS — E43 Unspecified severe protein-calorie malnutrition: Secondary | ICD-10-CM | POA: Diagnosis not present

## 2016-10-30 DIAGNOSIS — R531 Weakness: Secondary | ICD-10-CM | POA: Diagnosis not present

## 2016-10-30 DIAGNOSIS — M8700 Idiopathic aseptic necrosis of unspecified bone: Secondary | ICD-10-CM | POA: Diagnosis not present

## 2016-10-30 LAB — COMPREHENSIVE METABOLIC PANEL
ALBUMIN: 1.4 g/dL — AB (ref 3.5–5.0)
ALK PHOS: 139 U/L — AB (ref 38–126)
ALT: 42 U/L (ref 14–54)
ANION GAP: 4 — AB (ref 5–15)
AST: 33 U/L (ref 15–41)
BILIRUBIN TOTAL: 0.6 mg/dL (ref 0.3–1.2)
BUN: 15 mg/dL (ref 6–20)
CALCIUM: 7.8 mg/dL — AB (ref 8.9–10.3)
CO2: 21 mmol/L — AB (ref 22–32)
Chloride: 118 mmol/L — ABNORMAL HIGH (ref 101–111)
Creatinine, Ser: 0.52 mg/dL (ref 0.44–1.00)
GFR calc Af Amer: >60 mL/min (ref >60)
GFR calc non Af Amer: >60 mL/min (ref >60)
GLUCOSE: 74 mg/dL (ref 65–99)
POTASSIUM: 3.7 mmol/L (ref 3.5–5.1)
SODIUM: 143 mmol/L (ref 135–145)
TOTAL PROTEIN: 3.9 g/dL — AB (ref 6.5–8.1)

## 2016-10-30 LAB — AMMONIA: AMMONIA: 87 umol/L — AB (ref 9–35)

## 2016-10-30 LAB — CBC
HCT: 29 % — ABNORMAL LOW (ref 35.0–47.0)
HEMOGLOBIN: 9.8 g/dL — AB (ref 12.0–16.0)
MCH: 35.4 pg — AB (ref 26.0–34.0)
MCHC: 33.8 g/dL (ref 32.0–36.0)
MCV: 104.6 fL — ABNORMAL HIGH (ref 80.0–100.0)
Platelets: 156 10*3/uL (ref 150–440)
RBC: 2.77 MIL/uL — AB (ref 3.80–5.20)
RDW: 15 % — ABNORMAL HIGH (ref 11.5–14.5)
WBC: 5.9 10*3/uL (ref 3.6–11.0)

## 2016-10-30 LAB — SALICYLATE LEVEL: Salicylate Lvl: <7 mg/dL (ref 2.8–30.0)

## 2016-10-30 LAB — PROTIME-INR
INR: 1.04
INR: 1.05
PROTHROMBIN TIME: 13.6 s (ref 11.4–15.2)
Prothrombin Time: 13.7 seconds (ref 11.4–15.2)

## 2016-10-30 LAB — URINALYSIS, ROUTINE W REFLEX MICROSCOPIC
BACTERIA UA: NONE SEEN
Bilirubin Urine: NEGATIVE
Glucose, UA: NEGATIVE mg/dL
Hgb urine dipstick: NEGATIVE
KETONES UR: NEGATIVE mg/dL
NITRITE: NEGATIVE
PROTEIN: NEGATIVE mg/dL
Specific Gravity, Urine: 1.018 (ref 1.005–1.030)
pH: 5 (ref 5.0–8.0)

## 2016-10-30 LAB — URINE DRUG SCREEN, QUALITATIVE (ARMC ONLY)
Amphetamines, Ur Screen: NOT DETECTED
BARBITURATES, UR SCREEN: NOT DETECTED
BENZODIAZEPINE, UR SCRN: NOT DETECTED
Cannabinoid 50 Ng, Ur ~~LOC~~: NOT DETECTED
Cocaine Metabolite,Ur ~~LOC~~: NOT DETECTED
MDMA (Ecstasy)Ur Screen: NOT DETECTED
METHADONE SCREEN, URINE: NOT DETECTED
OPIATE, UR SCREEN: POSITIVE — AB
PHENCYCLIDINE (PCP) UR S: NOT DETECTED
Tricyclic, Ur Screen: POSITIVE — AB

## 2016-10-30 LAB — APTT: aPTT: 35 seconds (ref 24–36)

## 2016-10-30 LAB — HCG, QUANTITATIVE, PREGNANCY: hCG, Beta Chain, Quant, S: 3 m[IU]/mL (ref <5)

## 2016-10-30 LAB — ACETAMINOPHEN LEVEL: Acetaminophen (Tylenol), Serum: <10 ug/mL — ABNORMAL LOW (ref 10–30)

## 2016-10-30 LAB — ETHANOL: Alcohol, Ethyl (B): <5 mg/dL (ref <5)

## 2016-10-30 MED ORDER — ONDANSETRON HCL 4 MG/2ML IJ SOLN: 4.0000 mg | Freq: Four times a day (QID) | INTRAMUSCULAR | Status: DC | PRN

## 2016-10-30 MED ORDER — LEVOTHYROXINE SODIUM 25 MCG PO TABS
125.0000 ug | ORAL_TABLET | ORAL | Status: DC
  Administered 2016-10-31 – 2016-11-07 (×5): 125 ug via ORAL
  Filled 2016-10-30 (×5): qty 1

## 2016-10-30 MED ORDER — PANTOPRAZOLE SODIUM 40 MG PO TBEC
40.0000 mg | DELAYED_RELEASE_TABLET | Freq: Every day | ORAL | Status: DC
  Administered 2016-10-30 – 2016-11-07 (×9): 40 mg via ORAL
  Filled 2016-10-30 (×9): qty 1

## 2016-10-30 MED ORDER — IPRATROPIUM BROMIDE 0.02 % IN SOLN: 0.5000 mg | Freq: Four times a day (QID) | RESPIRATORY_TRACT | Status: DC | PRN

## 2016-10-30 MED ORDER — SIMETHICONE 80 MG PO CHEW
80.0000 mg | CHEWABLE_TABLET | Freq: Four times a day (QID) | ORAL | Status: DC | PRN
  Administered 2016-11-01: 10:00:00 80 mg via ORAL
  Filled 2016-10-30 (×2): qty 1

## 2016-10-30 MED ORDER — OXYCODONE-ACETAMINOPHEN 5-325 MG PO TABS: 1.0000 | ORAL_TABLET | Freq: Four times a day (QID) | ORAL | 0 refills | Status: DC | PRN

## 2016-10-30 MED ORDER — BISACODYL 5 MG PO TBEC: 5.0000 mg | DELAYED_RELEASE_TABLET | Freq: Every day | ORAL | Status: DC | PRN

## 2016-10-30 MED ORDER — OXYCODONE-ACETAMINOPHEN 5-325 MG PO TABS
1.0000 | ORAL_TABLET | Freq: Four times a day (QID) | ORAL | Status: DC | PRN
  Administered 2016-10-30 (×2): 1 via ORAL
  Filled 2016-10-30 (×2): qty 1

## 2016-10-30 MED ORDER — POTASSIUM CHLORIDE CRYS ER 20 MEQ PO TBCR
20.0000 meq | EXTENDED_RELEASE_TABLET | Freq: Every day | ORAL | Status: DC
  Administered 2016-10-30 – 2016-11-07 (×9): 20 meq via ORAL
  Filled 2016-10-30 (×11): qty 1

## 2016-10-30 MED ORDER — OMEGA-3-ACID ETHYL ESTERS 1 G PO CAPS
1.0000 g | ORAL_CAPSULE | Freq: Every day | ORAL | Status: DC
  Administered 2016-10-30 – 2016-11-07 (×9): 1 g via ORAL
  Filled 2016-10-30 (×9): qty 1

## 2016-10-30 MED ORDER — ONDANSETRON HCL 4 MG PO TABS
4.0000 mg | ORAL_TABLET | Freq: Four times a day (QID) | ORAL | Status: DC | PRN
Start: 2016-10-30 — End: 2016-11-07

## 2016-10-30 MED ORDER — FUROSEMIDE 40 MG PO TABS
40.0000 mg | ORAL_TABLET | Freq: Every day | ORAL | Status: DC
  Administered 2016-10-30 – 2016-11-01 (×3): 40 mg via ORAL
  Filled 2016-10-30 (×3): qty 1

## 2016-10-30 MED ORDER — OXYCODONE-ACETAMINOPHEN 5-325 MG PO TABS
1.0000 | ORAL_TABLET | Freq: Four times a day (QID) | ORAL | Status: DC | PRN
  Administered 2016-10-30 – 2016-11-07 (×18): 1 via ORAL
  Filled 2016-10-30 (×18): qty 1

## 2016-10-30 MED ORDER — SODIUM CHLORIDE 0.9% FLUSH
3.0000 mL | Freq: Two times a day (BID) | INTRAVENOUS | Status: DC
  Administered 2016-10-30 – 2016-11-07 (×18): 3 mL via INTRAVENOUS

## 2016-10-30 MED ORDER — NAPROXEN SODIUM 550 MG PO TABS: 1320.0000 mg | ORAL_TABLET | Freq: Two times a day (BID) | ORAL | Status: DC

## 2016-10-30 MED ORDER — TRAMADOL HCL 50 MG PO TABS
50.0000 mg | ORAL_TABLET | Freq: Four times a day (QID) | ORAL | Status: DC | PRN
  Administered 2016-10-30 – 2016-11-03 (×5): 50 mg via ORAL
  Filled 2016-10-30 (×5): qty 1

## 2016-10-30 MED ORDER — SODIUM CHLORIDE 0.9% FLUSH: 3.0000 mL | INTRAVENOUS | Status: DC | PRN

## 2016-10-30 MED ORDER — LACTULOSE 10 GM/15ML PO SOLN
20.0000 g | Freq: Every day | ORAL | Status: DC
  Administered 2016-10-30 – 2016-10-31 (×2): 20 g via ORAL
  Filled 2016-10-30 (×2): qty 30

## 2016-10-30 MED ORDER — SENNOSIDES-DOCUSATE SODIUM 8.6-50 MG PO TABS: 1.0000 | ORAL_TABLET | Freq: Every evening | ORAL | Status: DC | PRN

## 2016-10-30 MED ORDER — DOXYCYCLINE HYCLATE 100 MG PO TABS
100.0000 mg | ORAL_TABLET | Freq: Two times a day (BID) | ORAL | Status: DC
  Administered 2016-10-30 (×2): 100 mg via ORAL
  Filled 2016-10-30 (×3): qty 1

## 2016-10-30 MED ORDER — LEVOTHYROXINE SODIUM 137 MCG PO TABS
137.0000 ug | ORAL_TABLET | ORAL | Status: DC
  Administered 2016-10-30 – 2016-11-07 (×5): 137 ug via ORAL
  Filled 2016-10-30 (×5): qty 1

## 2016-10-30 MED ORDER — ADULT MULTIVITAMIN W/MINERALS CH
1.0000 | ORAL_TABLET | Freq: Two times a day (BID) | ORAL | Status: DC
  Administered 2016-10-30 – 2016-11-07 (×17): 1 via ORAL
  Filled 2016-10-30 (×18): qty 1

## 2016-10-30 MED ORDER — ALBUTEROL SULFATE (2.5 MG/3ML) 0.083% IN NEBU: 2.5000 mg | INHALATION_SOLUTION | Freq: Four times a day (QID) | RESPIRATORY_TRACT | Status: DC | PRN

## 2016-10-30 MED ORDER — SODIUM CHLORIDE 0.9 % IV SOLN: 250.0000 mL | INTRAVENOUS | Status: DC | PRN

## 2016-10-30 MED ORDER — TRAMADOL HCL 50 MG PO TABS: 50.0000 mg | ORAL_TABLET | Freq: Four times a day (QID) | ORAL | Status: DC | PRN

## 2016-10-30 MED ORDER — NORTRIPTYLINE HCL 25 MG PO CAPS
75.0000 mg | ORAL_CAPSULE | Freq: Every day | ORAL | Status: DC
  Administered 2016-10-30 – 2016-11-06 (×9): 75 mg via ORAL
  Filled 2016-10-30 (×11): qty 3

## 2016-10-30 MED ORDER — PREGABALIN 75 MG PO CAPS
225.0000 mg | ORAL_CAPSULE | Freq: Two times a day (BID) | ORAL | Status: DC
  Administered 2016-10-30 – 2016-11-07 (×18): 225 mg via ORAL
  Filled 2016-10-30 (×19): qty 3

## 2016-10-30 MED ORDER — NAPROXEN 500 MG PO TABS
500.0000 mg | ORAL_TABLET | Freq: Two times a day (BID) | ORAL | Status: DC
  Administered 2016-10-30 – 2016-11-07 (×17): 500 mg via ORAL
  Filled 2016-10-30 (×20): qty 1

## 2016-10-30 MED ORDER — MAGNESIUM CITRATE PO SOLN
1.0000 | Freq: Once | ORAL | Status: DC | PRN
  Filled 2016-10-30: qty 296

## 2016-10-30 MED ORDER — NAPROXEN SODIUM 550 MG PO TABS
550.0000 mg | ORAL_TABLET | Freq: Two times a day (BID) | ORAL | Status: DC
  Filled 2016-10-30: qty 1

## 2016-10-30 NOTE — Plan of Care (Signed)
Problem: Education: Goal: Knowledge of [facility] Education information/materials will improve Outcome: Progressing VSS, free of falls.  External catheter placed for urinary elimination, skin integrity.  Compliant w/ q2h repositioning for skin integrity.  Reported L hip pain 9/10, improved to 8/10 w/ PRN PO Percocet 5-325mg .  No other complaints.  Bed in low position, bed alarm on.  Call bell within reach, WCTM.

## 2016-10-30 NOTE — NC FL2 (Signed)
[place] MEDICAID FL2 LEVEL OF CARE SCREENING TOOL     IDENTIFICATION  Patient Name: Dorothy Dyer Birthdate: [birth year] Sex: female Admission Date (Current Location): 10/29/2016  [place] and [place] Number:  Chiropodist and Address:  [facility], [address] [place], [place] [address]      Provider Number: [id number]  Attending Physician Name and Address:  Auburn Bilberry, MD  Relative Name and Phone Number:  Tonita Phoenix [phone]     Current Level of Care: Hospital Recommended Level of Care: Skilled Nursing Facility Prior Approval Number:    Date Approved/Denied:   PASRR Number: [id number]  Discharge Plan: SNF    Current Diagnoses: Patient Active Problem List   Diagnosis Date Noted  . Weakness 10/29/2016  . Tylenol overdose 10/22/2016  . Pressure injury of skin 10/15/2016  . Bilateral lower leg cellulitis 10/14/2016  . Leg swelling 10/14/2016  . Pain 09/27/2016  . Clavicular fracture 09/27/2016  . Abdominal pain, chronic, generalized   . Protein-calorie malnutrition, severe 06/02/2016  . Acute encephalopathy 06/01/2016  . Chronic left hip pain 06/01/2016  . Overdose of opiate or related narcotic, accidental or unintentional, initial encounter 06/01/2016  . Lower extremity cellulitis 06/01/2016  . 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 Left knee pain 06/01/2016  . Bilateral lower extremity edema 06/01/2016  . Acute hypokalemia 02/11/2016  . Hypoglycemia 02/11/2016  . Marijuana abuse 02/11/2016  . Chronic narcotic use 02/11/2016  . AKI (acute kidney injury) (HCC) 02/10/2016  . AVN of femur (HCC)   . 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 Hoarseness 06/17/2014  . 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 Fibromyalgia 06/17/2014  . Chronic abdominal pain 06/17/2014  . Knee pain, bilateral 06/17/2014  . Vitamin D deficiency 06/17/2014  . Vitamin B12 deficiency 06/17/2014  . Iron deficiency anemia 06/17/2014  .  Thyroid activity decreased 06/17/2014  . Abnormal Pap smear of cervix 06/17/2014  . Anemia   . Anxiety   . Arthritis   . Acid reflux   . Neuromuscular disorder (HCC)   . Hypertension   . Osteoporosis   . HPV (human papilloma virus) infection     Orientation RESPIRATION BLADDER Height & Weight     Self, Time, Situation, Place  Normal Continent Weight: 195 lb 1.6 oz (88.5 kg) Height:  5\' 1"  (154.9 cm)  BEHAVIORAL SYMPTOMS/MOOD NEUROLOGICAL BOWEL NUTRITION STATUS      Continent Diet (Regular diet)  AMBULATORY STATUS COMMUNICATION OF NEEDS Skin   Limited Assist Verbally Surgical wounds                       Personal Care Assistance Level of Assistance  Bathing, Feeding, Dressing Bathing Assistance: Limited assistance Feeding assistance: Independent Dressing Assistance: Limited assistance     Functional Limitations Info  Sight, Hearing, Speech Sight Info: Adequate Hearing Info: Adequate Speech Info: Adequate    SPECIAL CARE FACTORS FREQUENCY  OT (By licensed OT)     PT Frequency: 5x a week OT Frequency: 5x a week            Contractures Contractures Info: Not present    Additional Factors Info  Code Status, Allergies, Isolation Precautions Code Status Info: Full Code Allergies Info: IBUPROFEN, TORADOL KETOROLAC TROMETHAMINE, ASPIRIN, CLEOCIN CLINDAMYCIN HCL      Isolation Precautions Info: Contact precautions for MRSA     Current Medications (10/30/2016):  This is the current hospital active medication list Current Facility-Administered Medications  Medication Dose Route Frequency Provider  Last Rate Last Dose  . 0.9 %  sodium chloride infusion  250 mL Intravenous PRN Hugelmeyer, Alexis, DO      . albuterol (PROVENTIL) (2.5 MG/3ML) 0.083% nebulizer solution 2.5 mg  2.5 mg Nebulization Q6H PRN Hugelmeyer, Alexis, DO      . bisacodyl (DULCOLAX) EC tablet 5 mg  5 mg Oral Daily PRN Hugelmeyer, Alexis, DO      . furosemide (LASIX) tablet 40 mg  40 mg Oral  Daily Hugelmeyer, Alexis, DO   40 mg at 10/30/16 0857  . ipratropium (ATROVENT) nebulizer solution 0.5 mg  0.5 mg Nebulization Q6H PRN Hugelmeyer, Alexis, DO      . lactulose (CHRONULAC) 10 GM/15ML solution 20 g  20 g Oral Daily Hugelmeyer, Alexis, DO   20 g at 10/30/16 1040  . [START ON 10/31/2016] levothyroxine (SYNTHROID, LEVOTHROID) tablet 125 mcg  125 mcg Oral QODAY Hugelmeyer, Alexis, DO      . levothyroxine (SYNTHROID, LEVOTHROID) tablet 137 mcg  137 mcg Oral QODAY Hugelmeyer, Alexis, DO   137 mcg at 10/30/16 0858  . magnesium citrate solution 1 Bottle  1 Bottle Oral Once PRN Hugelmeyer, Alexis, DO      . multivitamin with minerals tablet 1 tablet  1 tablet Oral BID Hugelmeyer, Alexis, DO   1 tablet at 10/30/16 0857  . naproxen (NAPROSYN) tablet 500 mg  500 mg Oral BID WC Hugelmeyer, Alexis, DO   500 mg at 10/30/16 0856  . nortriptyline (PAMELOR) capsule 75 mg  75 mg Oral QHS Hugelmeyer, Alexis, DO   75 mg at 10/30/16 0558  . omega-3 acid ethyl esters (LOVAZA) capsule 1 g  1 g Oral Daily Hugelmeyer, Alexis, DO   1 g at 10/30/16 0856  . ondansetron (ZOFRAN) tablet 4 mg  4 mg Oral Q6H PRN Hugelmeyer, Alexis, DO       Or  . ondansetron (ZOFRAN) injection 4 mg  4 mg Intravenous Q6H PRN Hugelmeyer, Alexis, DO      . oxyCODONE-acetaminophen (PERCOCET/ROXICET) 5-325 MG per tablet 1 tablet  1 tablet Oral Q6H PRN Auburn Bilberry, MD      . pantoprazole (PROTONIX) EC tablet 40 mg  40 mg Oral Daily Hugelmeyer, Alexis, DO   40 mg at 10/30/16 0854  . potassium chloride SA (K-DUR,KLOR-CON) CR tablet 20 mEq  20 mEq Oral Daily Hugelmeyer, Alexis, DO   20 mEq at 10/30/16 0857  . pregabalin (LYRICA) capsule 225 mg  225 mg Oral BID Hugelmeyer, Alexis, DO   225 mg at 10/30/16 0857  . senna-docusate (Senokot-S) tablet 1 tablet  1 tablet Oral QHS PRN Hugelmeyer, Alexis, DO      . simethicone (MYLICON) chewable tablet 80 mg  80 mg Oral QID PRN Hugelmeyer, Alexis, DO      . sodium chloride flush (NS) 0.9 %  injection 3 mL  3 mL Intravenous Q12H Hugelmeyer, Alexis, DO   3 mL at 10/30/16 1039  . sodium chloride flush (NS) 0.9 % injection 3 mL  3 mL Intravenous PRN Hugelmeyer, Alexis, DO      . traMADol (ULTRAM) tablet 50 mg  50 mg Oral Q6H PRN Auburn Bilberry, MD         Discharge Medications: Please see discharge summary for a list of discharge medications.  Relevant Imaging Results:  Relevant Lab Results:   Additional Information SS#: [id number]  [place]

## 2016-10-30 NOTE — Progress Notes (Signed)
Patient admitted for falls and takes naproxen 1,375 mg twice daily. Spoke to MD to recommend reducing dose of naproxen considering patient has HTN which NSAIDs can worsen in addition to reduce the risk of GI ulceration.  MD notified and agrees with plan.  Thomasene Ripple, PharmD, BCPS Clinical Pharmacist 10/30/2016

## 2016-10-30 NOTE — Care Management Obs Status (Signed)
MEDICARE OBSERVATION STATUS NOTIFICATION   Patient Details  Name: Dorothy Dyer MRN: [id number] Date of Birth: 04-Jun-1975   Medicare Observation Status Notification Given:  Yes    Gwenette Greet, RN 10/30/2016, 10:14 AM

## 2016-10-30 NOTE — Clinical Social Work Placement (Addendum)
   CLINICAL SOCIAL WORK PLACEMENT  NOTE  Date:  10/30/2016  Patient Details  Name: Dorothy Dyer MRN: [id number] Date of Birth: 1976-02-28  Clinical Social Work is seeking post-discharge placement for this patient at the Skilled  Nursing Facility level of care (*CSW will initial, date and re-position this form in  chart as items are completed):  Yes   Patient/family provided with [facility] Clinical Social Work Department's list of facilities offering this level of care within the geographic area requested by the patient (or if unable, by the patient's family).  Yes   Patient/family informed of their freedom to choose among providers that offer the needed level of care, that participate in Medicare, Medicaid or managed care program needed by the patient, have an available bed and are willing to accept the patient.  Yes   Patient/family informed of [facility]'s ownership interest in [facility] and [facility], as well as of the fact that they are under no obligation to receive care at these facilities.  PASRR submitted to EDS on 10/30/16     PASRR number received on       Existing PASRR number confirmed on 10/30/16     FL2 transmitted to all facilities in geographic area requested by pt/family on 10/30/16     FL2 transmitted to all facilities within larger geographic area on       Patient informed that his/her managed care company has contracts with or will negotiate with certain facilities, including the following:         11-03-16   Patient/family informed of bed offers received. (11-07-16 updated Windell Moulding, MSW, LCSWA, )  Patient chooses bed at  [facility] (11-07-16 updated Windell Moulding, MSW, LCSWA, )     Physician recommends and patient chooses bed at      Patient to be transferred to  [facility] on   (11-07-16 updated Windell Moulding, MSW, LCSWA, ).  Patient to be transferred to facility by  [facility] (11-07-16 updated Windell Moulding, MSW, [place],  )     Patient family notified on  11-07-16 of transfer. (11-07-16 updated Windell Moulding, MSW, LCSWA, )  Name of family member notified:   Patient to notify her family (11-07-16 updated Windell Moulding, MSW, LCSWA, )     PHYSICIAN Please sign FL2     Additional Comment:    _______________________________________________ Darleene Cleaver, LCSWA 10/30/2016, 5:48 PM  (11-07-16 updated Windell Moulding, MSW, [place], )

## 2016-10-30 NOTE — Evaluation (Addendum)
Physical Therapy Evaluation Patient Details Name: Phoebe Regal MRN: [id number] DOB: 06-07-1975 Today's Date: 10/30/2016   History of Present Illness  Khamiya Snitker is a 41 y.o. female with a known history of anxiety, hypertension, neuromuscular disorder, hypothyroidism, GERD, IBS, chronic lower extremity edema with stasis dermatitis osteoporosis and AVN of the right hip presents to the emergency department for evaluation of weakness and fall.  Patient was in a usual state of health until she slowly fell to the floor while using her walker and landed on her left side. She described generalized weakness that prohibited her from getting back to standing position. She was on the floor for about five hours. Denies head trauma or loss of consciousness. Patient lives with her son who has autism. Of note patient was hospitalized on 10/22/2016 for Tylenol overdose after taking an unknown quantity of Percocet as well as drinking alcohol and a possible suicide attempt.  At that time she was found to have acute hepatic and toxic encephalopathy, anasarca related to liver disease and severe protein malnutrition. she was discharged on 10/26/2016. She has had multiple hospital admissions for falls, fractures and opiate overdoses.  Clinical Impression  Pt admitted with above diagnosis. Pt currently with functional limitations due to the deficits listed below (see PT Problem List).  Pt is exceedingly weak on this date. She has had repeated hospital admissions with consistent decline in function. She is currently requiring modA+1 for bed mobility and minA+1 for transfers. She requires minA+1 for support to take short shuffling steps from bed to recliner. Bilateral LE buckling during ambulation. Pt is very weak and unsteady. She is able to make it to the recliner but is unable to safely ambulate farther at this time due to weakness and concerns for safety. Pt has suffered repeated falls and is currently unsafe to  function at home alone. She will most certainly suffer another fall and require readmission to the hospital. Recommend SNF placement in order to improve strength and safety as well as decrease fall risk in order to improve patient's quality of life and prevent further admissions. Pt will benefit from PT services to address deficits in strength, balance, and mobility in order to return to full function at home after SNF placement.      Follow Up Recommendations SNF    Equipment Recommendations  None recommended by PT    Recommendations for Other Services       Precautions / Restrictions Precautions Precautions: Fall Restrictions Weight Bearing Restrictions: No Other Position/Activity Restrictions: Pt was previously NWB RUE s/p clavicle fracture however has not respected precautions due to need for heavy UE support on walker to ambulate      Mobility  Bed Mobility Overal bed mobility: Needs Assistance Bed Mobility: Supine to Sit     Supine to sit: Mod assist     General bed mobility comments: Pt requiring modA with cues to roll onto L side, advance bilateral LE, and push trunk upright into sitting. Pt very weak and requires heavy UE support on bed rail and HOB elevated. Once in sitting balance is fair with feet supported on ground  Transfers Overall transfer level: Needs assistance Equipment used: Rolling walker (2 wheeled) Transfers: Sit to/from Stand Sit to Stand: Min assist         General transfer comment: Pt requires minA+1 for sit to stand transfers. Safe hand placement demonstrated however bilateral LE weakness results in need for assist and increased time to transfer  Ambulation/Gait Ambulation/Gait assistance: Min assist Ambulation  Distance (Feet): 3 Feet Assistive device: Rolling walker (2 wheeled) Gait Pattern/deviations: Decreased step length - right;Decreased step length - left Gait velocity: Decreased Gait velocity interpretation: <1.8 ft/sec, indicative of  risk for recurrent falls General Gait Details: Pt require minA+1 for support to take short shuffling steps from bed to recliner. Bilateral LE buckling during ambulation. Pt is very weak and unsteady. She is able to make it to the recliner but is unable to safely ambulate farther at this time due to weakness  Stairs            Wheelchair Mobility    Modified Rankin (Stroke Patients Only)       Balance Overall balance assessment: Needs assistance Sitting-balance support: No upper extremity supported Sitting balance-Leahy Scale: Fair     Standing balance support: Bilateral upper extremity supported Standing balance-Leahy Scale: Poor Standing balance comment: Pt requiring bilateral UE support on rolling walker to stabilize while standing                             Pertinent Vitals/Pain Pain Assessment: 0-10 Pain Score: 9  Pain Location: L hip  Pain Descriptors / Indicators: Grimacing Pain Intervention(s): Monitored during session    Home Living Family/patient expects to be discharged to:: Skilled nursing facility Living Arrangements: Alone (Son now living in an "autism home." ) Available Help at Discharge: Family Type of Home: Mobile home Home Access: Stairs to enter Entrance Stairs-Rails: Left Entrance Stairs-Number of Steps: 4 Home Layout: One level Home Equipment: Environmental consultant - 2 wheels;Bedside commode;Shower seat;Toilet riser;Other (comment) (no wheelchair or hospital bed)      Prior Function Level of Independence: Independent         Comments: Per pt son is now living "in an autism home." Brother and sister in law provide some assist. Father and mother have returned to [place] since last admission and are not available to help. States that previoulsy she was independent with ADLs/IADLs but has been declining severely over the last few months with frequent admissions to the hospital. Ambulates with rolling walker limited distances prior to admission.  Reports at least 8 falls in the last 6 months     Hand Dominance   Dominant Hand: Right    Extremity/Trunk Assessment   Upper Extremity Assessment Upper Extremity Assessment: Generalized weakness RUE Deficits / Details: Poor bilateral shoulder flexion and elbow flexion/extension strength. Bilateral UE swelling noted    Lower Extremity Assessment Lower Extremity Assessment: Generalized weakness LLE Deficits / Details: Significant LE weakness which limits patient's ability to perform SLR. Severe swelling with unna boot wrappings in place. L knee flexion/extension is 4-/5 compared to 4+/5 on R side. Bilateral LE buckling in standing       Communication   Communication: No difficulties  Cognition Arousal/Alertness: Awake/alert Behavior During Therapy: Flat affect Overall Cognitive Status: Within Functional Limits for tasks assessed                                 General Comments: AOx4 at time of PT evaluation. Very flat affect      General Comments      Exercises General Exercises - Lower Extremity Long Arc Quad: Strengthening;10 reps;Seated;Both Heel Slides: Strengthening;Both;10 reps;Seated Hip ABduction/ADduction: Strengthening;Both;10 reps;Seated Hip Flexion/Marching: Strengthening;Both;10 reps;Seated Heel Raises: Strengthening;Both;10 reps;Seated   Assessment/Plan    PT Assessment Patient needs continued PT services  PT Problem List Decreased strength;Decreased  activity tolerance;Decreased balance;Decreased mobility;Decreased knowledge of use of DME;Pain       PT Treatment Interventions DME instruction;Gait training;Stair training;Functional mobility training;Therapeutic exercise;Therapeutic activities;Balance training;Neuromuscular re-education;Patient/family education    PT Goals (Current goals can be found in the Care Plan section)  Acute Rehab PT Goals Patient Stated Goal: Improve strength and function. "I can't care for myself" PT Goal  Formulation: With patient Time For Goal Achievement: 11/13/16 Potential to Achieve Goals: Fair    Frequency Min 2X/week   Barriers to discharge Decreased caregiver support Pt lives alone and is currently too weak to care for herself    Co-evaluation               AM-PAC PT "6 Clicks" Daily Activity  Outcome Measure Difficulty turning over in bed (including adjusting bedclothes, sheets and blankets)?: Unable Difficulty moving from lying on back to sitting on the side of the bed? : Unable Difficulty sitting down on and standing up from a chair with arms (e.g., wheelchair, bedside commode, etc,.)?: Unable Help needed moving to and from a bed to chair (including a wheelchair)?: A Lot Help needed walking in hospital room?: A Lot Help needed climbing 3-5 steps with a railing? : Total 6 Click Score: 8    End of Session Equipment Utilized During Treatment: Gait belt Activity Tolerance: Patient tolerated treatment well Patient left: in chair;with call bell/phone within reach;with chair alarm set Nurse Communication: Mobility status;Other (comment) (Needs external catheter replaced) PT Visit Diagnosis: Pain;Difficulty in walking, not elsewhere classified (R26.2);Muscle weakness (generalized) (M62.81);Repeated falls (R29.6) Pain - Right/Left: Left Pain - part of body: Hip    Time: 1430-1455 PT Time Calculation (min) (ACUTE ONLY): 25 min   Charges:   PT Evaluation $PT Eval Moderate Complexity: 1 Mod PT Treatments $Therapeutic Exercise: 8-22 mins   PT G Codes:   PT G-Codes **NOT FOR INPATIENT CLASS** Functional Assessment Tool Used: AM-PAC 6 Clicks Basic Mobility Functional Limitation: Mobility: Walking and moving around Mobility: Walking and Moving Around Current Status (Z6109): At least 80 percent but less than 100 percent impaired, limited or restricted Mobility: Walking and Moving Around Goal Status [phone]): At least 40 percent but less than 60 percent impaired, limited or  restricted    Lynnea Maizes PT, DPT    Huprich,Jason 10/30/2016, 3:33 PM

## 2016-10-30 NOTE — Clinical Social Work Note (Signed)
Clinical Social Work Assessment  Patient Details  Name: Dorothy Dyer MRN: [id number] Date of Birth: June 08, 1975  Date of referral:  10/30/16               Reason for consult:  Facility Placement                Permission sought to share information with:  Family Supports, Magazine features editor Permission granted to share information::  Yes, Verbal Permission Granted  Name::     Tonita Phoenix [phone]   Agency::  SNF admissions  Relationship::     Contact Information:     Housing/Transportation Living arrangements for the past 2 months:  Single Family Home Source of Information:  Patient Patient Interpreter Needed:  None Criminal Activity/Legal Involvement Pertinent to Current Situation/Hospitalization:  No - Comment as needed Significant Relationships:  Other Family Members Lives with:  Self Do you feel safe going back to the place where you live?  No Need for family participation in patient care:  No (Coment)  Care giving concerns:  Patient feels she needs some short term rehab before she is able to return back home.   Social Worker assessment / plan: Patient is a 41 year old female who is alert and oriented x4.  Patient states that she has not been to rehab before, CSW explained process of going to SNF and role of CSW.  CSW explained to patient what to expect at SNF and how insurance will pay for stay.  Patient lives alone and has a brother who helps her when she needs extra help.  Patient was in the hospital in July and she was supposed to be discharged to [facility], however, insurance denied patient and she ended up discharging home with home health.  Patient has been in the hospital 3 times since then, and each time she has discharged back home with home health.  Patient states she really hopes she can be approved to go to SNF this time, CSW explained to her that if insurance denies again, she will have to return home with home health.  Patient  expressed understanding and did not have any other questions or concerns.  CSW was given permission to begin bed search in [place] and [place].  Employment status:  Disabled (Comment on whether or not currently receiving Disability) Insurance information:  Managed Medicare PT Recommendations:  Skilled Nursing Facility Information / Referral to community resources:  Skilled Nursing Facility  Patient/Family's Response to care:  Patient is in agreement to going to SNF for short term rehab.  Patient/Family's Understanding of and Emotional Response to Diagnosis, Current Treatment, and Prognosis:  Patient expressed she feels that since she has had multiple admissions she would benefit greatly from SNF placement.  Patient is hopeful that she will not have to be at [facility] for very long though.  Emotional Assessment Appearance:  Appears stated age Attitude/Demeanor/Rapport:    Affect (typically observed):  Appropriate, Calm, Pleasant Orientation:  Oriented to Self, Oriented to Place, Oriented to  Time, Oriented to Situation Alcohol / Substance use:  Not Applicable Psych involvement (Current and /or in the community):  No (Comment)  Discharge Needs  Concerns to be addressed:  Lack of Support Readmission within the last 30 days:  Yes (10-26-16 discharged home.) Current discharge risk:  Lack of support system, Lives alone Barriers to Discharge:  Continued Medical Work up, [employer]   Darleene Cleaver, LCSWA 10/30/2016, 5:42 PM

## 2016-10-30 NOTE — Care Management Note (Addendum)
Case Management Note  Patient Details  Name: Dorothy Dyer MRN: [id number] Date of Birth: Jan 03, 1976  Subjective/Objective:                  Admitted to [facility] under observation status with the diagnosis of weakness. Lives alone. Brother is Dorothy Dyer [phone]). States prescriptions are filled at [facility] on [address].  Discharged from [facility] 05/26/16. States she has Home Health coming into the home. Active with [facility] No skilled nursing. Raised toilet seat, shower chair, and rolling walker in the home. States she takes care of all basic activities of daily living herself, drives. Fallen 6 times in the last 3 months. Fair appetite.  Brother will transport.    Action/Plan: Physical therapy evaluation pending. [name] indicated that she needed to go to a facility to get stronger.   Expected Discharge Date:                  Expected Discharge Plan:     In-House Referral:     Discharge planning Services     Post Acute Care Choice:    Choice offered to:     DME Arranged:    DME Agency:     HH Arranged:    HH Agency:     Status of Service:     If discussed at [employer], dates discussed:    Additional Comments:  Gwenette Greet, RN MSN CCM Care Management [phone] 10/30/2016, 10:14 AM

## 2016-10-30 NOTE — Consult Note (Signed)
WOC Nurse wound consult note Reason for Consult:Patient with chronic venous insufficiency.  Wears weekly Radio broadcast assistant.  Will replace today.  Serum filled blister to left lateral leg.  Intact at this time. Edema to bilateral lower legs, chronic.  Scattered nonintact lesions to arms and back.  Had a recent fall.  Wound type:Inflammatory Pressure Injury POA: NA Measurement:Blister left leg 2 cm x 1 cm serum filled Wound GEX:BMWUXL blister Drainage (amount, consistency, odor) None Periwound:Chronic skin changes to bilateral lower legs Dressing procedure/placement/frequency:LEgs cleansed with soap and water.  Zinc layer from below toes to below knee, secure with self adherent coban.  Change weekly on Monday. WOC team will follow Maple Hudson RN BSN [facility] Pager [phone]

## 2016-10-30 NOTE — Progress Notes (Signed)
[facility] - [place] [facility]                                                                                                                                                                                  Patient Demographics   Dorothy Dyer, is a 41 y.o. female, DOB - 1976/03/13, ZOX:[id number]  Admit date - 10/29/2016   Admitting Physician Tonye Royalty, DO  Outpatient Primary MD for the patient is Patient, No Pcp Per   LOS - 0  Subjective: Patient admitted with fall known to our service has multiple admissions Was actually discharged from another facility 4 days ago on the 16th. Patient returns back to this hospital with a fall    Review of Systems:   CONSTITUTIONAL: No documented fever. No fatigue, weakness. No weight gain, no weight loss.  EYES: No blurry or double vision.  ENT: No tinnitus. No postnasal drip. No redness of the oropharynx.  RESPIRATORY: No cough, no wheeze, no hemoptysis. No dyspnea.  CARDIOVASCULAR: No chest pain. No orthopnea. No palpitations. No syncope.  GASTROINTESTINAL: No nausea, no vomiting or diarrhea. No abdominal pain. No melena or hematochezia.  GENITOURINARY: No dysuria or hematuria.  ENDOCRINE: No polyuria or nocturia. No heat or cold intolerance.  HEMATOLOGY: No anemia. No bruising. No bleeding.  INTEGUMENTARY: No rashes. No lesions.  MUSCULOSKELETAL: Pain all over NEUROLOGIC: No numbness, tingling, or ataxia. No seizure-type activity.  PSYCHIATRIC: No anxiety. No insomnia. No ADD.    Vitals:   Vitals:   10/30/16 0207 10/30/16 0450 10/30/16 0451 10/30/16 1200  BP: 119/77 98/72 108/68 103/66  Pulse: 99 99 98 95  Resp: 20 20    Temp: 98 F (36.7 C) 98 F (36.7 C)  97.6 F (36.4 C)  TempSrc: Oral Oral  Axillary  SpO2: 100% 100%  100%  Weight: 195 lb 1.6 oz (88.5 kg)     Height: 5\' 1"  (1.549 m)       Wt Readings from Last 3 Encounters:  10/30/16 195 lb 1.6 oz (88.5 kg)  10/26/16 207 lb 10.8 oz (94.2  kg)  10/15/16 181 lb (82.1 kg)     Intake/Output Summary (Last 24 hours) at 10/30/16 1501 Last data filed at 10/30/16 0418  Gross per 24 hour  Intake             1003 ml  Output              100 ml  Net              903 ml    Physical Exam:   GENERAL: Pleasant-appearing in no apparent distress.  HEAD, EYES, EARS, NOSE AND THROAT: Atraumatic,  normocephalic. Extraocular muscles are intact. Pupils equal and reactive to light. Sclerae anicteric. No conjunctival injection. No oro-pharyngeal erythema.  NECK: Supple. There is no jugular venous distention. No bruits, no lymphadenopathy, no thyromegaly.  HEART: Regular rate and rhythm,. No murmurs, no rubs, no clicks.  LUNGS: Clear to auscultation bilaterally. No rales or rhonchi. No wheezes.  ABDOMEN: Soft, flat, nontender, nondistended. Has good bowel sounds. No hepatosplenomegaly appreciated.  EXTREMITIES: No evidence of any cyanosis, clubbing, or peripheral edema.  +2 pedal and radial pulses bilaterally.  NEUROLOGIC: The patient is alert, awake, and oriented x3 with no focal motor or sensory deficits appreciated bilaterally.  SKIN: Moist and warm with no rashes appreciated.  Psych: Not anxious, depressed LN: No inguinal LN enlargement    Antibiotics   Anti-infectives    Start     Dose/Rate Route Frequency Ordered Stop   10/30/16 0159  doxycycline (VIBRA-TABS) tablet 100 mg  Status:  Discontinued     100 mg Oral Every 12 hours 10/30/16 0159 10/30/16 1328      Medications   Scheduled Meds: . furosemide  40 mg Oral Daily  . lactulose  20 g Oral Daily  . [START ON 10/31/2016] levothyroxine  125 mcg Oral QODAY  . levothyroxine  137 mcg Oral QODAY  . multivitamin with minerals  1 tablet Oral BID  . naproxen  500 mg Oral BID WC  . nortriptyline  75 mg Oral QHS  . omega-3 acid ethyl esters  1 g Oral Daily  . pantoprazole  40 mg Oral Daily  . potassium chloride SA  20 mEq Oral Daily  . pregabalin  225 mg Oral BID  . sodium  chloride flush  3 mL Intravenous Q12H   Continuous Infusions: . sodium chloride     PRN Meds:.sodium chloride, albuterol, bisacodyl, ipratropium, magnesium citrate, ondansetron **OR** ondansetron (ZOFRAN) IV, oxyCODONE-acetaminophen, senna-docusate, simethicone, sodium chloride flush, traMADol   Data Review:   Micro Results No results found for this or any previous visit (from the past 240 hour(s)).  Radiology Reports Dg Chest 1 View  Result Date: 10/14/2016 CLINICAL DATA:  Recent discharge from hospital, now unable to walk or stand independently. pt is having weakness and bilat lower extremity pain and swelling. Pt also has skin sloughing with bilat ulcerated areas to her lower legs and feet. EXAM: CHEST 1 VIEW COMPARISON:  Chest x-ray dated 09/27/2016. FINDINGS: Study is hypoinspiratory with crowding of the perihilar bronchovascular markings. Given the low lung volumes, lungs appear clear. No pleural effusion or pneumothorax seen. Heart size and mediastinal contours are normal. No acute or suspicious osseous finding. IMPRESSION: Low lung volumes. No active disease. No evidence of pneumonia or pulmonary edema. Electronically Signed   By: Bary Richard M.D.   On: 10/14/2016 17:32   Ct Humerus Right W Contrast  Result Date: 10/24/2016 CLINICAL DATA:  Right arm swelling and erythema with contracture of the right hand in the setting of a right clavicular fracture and right radial nerve palsy. Concern for infection or compartment syndrome. EXAM: CT OF THE UPPER RIGHT EXTREMITY WITH CONTRAST TECHNIQUE: Multidetector CT imaging of the right humerus and forearm was performed according to the standard protocol following intravenous contrast administration. COMPARISON:  Right humerus x-rays dated August 23, 2016. CONTRAST:  ISOVUE-300 IOPAMIDOL (ISOVUE-300) INJECTION 61% FINDINGS: Bones/Joint/Cartilage There is a subacute, comminuted fracture of the midclavicle with approximately 1 shaft bone width  posterior displacement of the distal fragment. There is surrounding callus formation. No additional fractures.  No glenohumeral  or elbow joint effusion. Ligaments Suboptimally assessed by CT. Muscles and Tendons Calcification adjacent to the posterior greater tuberosity in the region of the distal infraspinatus tendon. Soft tissues Diffuse anasarca in the visualized soft tissues. No drainable fluid collection. There is mild mass effect on the right subclavian vein as it passes behind the healing clavicle fracture. The vein appears patent. No axillary lymphadenopathy.  The visualized right lung is clear. IMPRESSION: 1. Diffuse anasarca in the visualized soft tissues, including the right arm and right chest and abdominal wall. No discrete drainable fluid collection is seen. Please note that the diagnosis of compartment syndrome cannot be established by imaging. 2. Subacute, healing comminuted fracture of the mid clavicle with approximately 1 shaft bone width posterior displacement of the distal fragment. 3. Mild mass effect on the right subclavian vein as it passes behind the healing clavicle fracture. The vein appears patent. If there is clinical concern for DVT, recommend further evaluation with ultrasound. 4. Probable calcific tendinitis of the infraspinatus tendon. Electronically Signed   By: Obie Dredge M.D.   On: 10/24/2016 08:25   Ct Forearm Right W Contrast  Result Date: 10/24/2016 CLINICAL DATA:  Right arm swelling and erythema with contracture of the right hand in the setting of a right clavicular fracture and right radial nerve palsy. Concern for infection or compartment syndrome. EXAM: CT OF THE UPPER RIGHT EXTREMITY WITH CONTRAST TECHNIQUE: Multidetector CT imaging of the right humerus and forearm was performed according to the standard protocol following intravenous contrast administration. COMPARISON:  Right humerus x-rays dated August 23, 2016. CONTRAST:  ISOVUE-300 IOPAMIDOL (ISOVUE-300)  INJECTION 61% FINDINGS: Bones/Joint/Cartilage There is a subacute, comminuted fracture of the midclavicle with approximately 1 shaft bone width posterior displacement of the distal fragment. There is surrounding callus formation. No additional fractures.  No glenohumeral or elbow joint effusion. Ligaments Suboptimally assessed by CT. Muscles and Tendons Calcification adjacent to the posterior greater tuberosity in the region of the distal infraspinatus tendon. Soft tissues Diffuse anasarca in the visualized soft tissues. No drainable fluid collection. There is mild mass effect on the right subclavian vein as it passes behind the healing clavicle fracture. The vein appears patent. No axillary lymphadenopathy.  The visualized right lung is clear. IMPRESSION: 1. Diffuse anasarca in the visualized soft tissues, including the right arm and right chest and abdominal wall. No discrete drainable fluid collection is seen. Please note that the diagnosis of compartment syndrome cannot be established by imaging. 2. Subacute, healing comminuted fracture of the mid clavicle with approximately 1 shaft bone width posterior displacement of the distal fragment. 3. Mild mass effect on the right subclavian vein as it passes behind the healing clavicle fracture. The vein appears patent. If there is clinical concern for DVT, recommend further evaluation with ultrasound. 4. Probable calcific tendinitis of the infraspinatus tendon. Electronically Signed   By: Obie Dredge M.D.   On: 10/24/2016 08:25   US Venous Img Lower Bilateral  Result Date: 10/15/2016 CLINICAL DATA:  Leg swelling. EXAM: BILATERAL LOWER EXTREMITY VENOUS DOPPLER ULTRASOUND TECHNIQUE: Gray-scale sonography with graded compression, as well as color Doppler and duplex ultrasound were performed to evaluate the lower extremity deep venous systems from the level of the common femoral vein and including the common femoral, femoral, profunda femoral, popliteal and calf  veins including the posterior tibial, peroneal and gastrocnemius veins when visible. The superficial great saphenous vein was also interrogated. Spectral Doppler was utilized to evaluate flow at rest and with distal augmentation maneuvers  in the common femoral, femoral and popliteal veins. COMPARISON:  Knee MRI 06/01/2016 FINDINGS: RIGHT LOWER EXTREMITY Common Femoral Vein: No evidence of thrombus. Normal compressibility, respiratory phasicity and response to augmentation. Saphenofemoral Junction: No evidence of thrombus. Normal compressibility and flow on color Doppler imaging. Profunda Femoral Vein: No evidence of thrombus. Normal compressibility and flow on color Doppler imaging. Femoral Vein: No evidence of thrombus. Normal compressibility, respiratory phasicity and response to augmentation. Popliteal Vein: No evidence of thrombus. Normal compressibility, respiratory phasicity and response to augmentation. Calf Veins: No evidence of thrombus. Normal compressibility and flow on color Doppler imaging. Superficial Great Saphenous Vein: No evidence of thrombus. Normal compressibility and flow on color Doppler imaging. Other Findings:  Subcutaneous edema. LEFT LOWER EXTREMITY Common Femoral Vein: No evidence of thrombus. Normal compressibility, respiratory phasicity and response to augmentation. Saphenofemoral Junction: No evidence of thrombus. Normal compressibility and flow on color Doppler imaging. Profunda Femoral Vein: No evidence of thrombus. Normal compressibility and flow on color Doppler imaging. Femoral Vein: No evidence of thrombus. Normal compressibility, respiratory phasicity and response to augmentation. Popliteal Vein: No evidence of thrombus. Normal compressibility, respiratory phasicity and response to augmentation. Calf Veins: No evidence of thrombus. Normal compressibility and flow on color Doppler imaging. Superficial Great Saphenous Vein: No evidence of thrombus. Normal compressibility and flow  on color Doppler imaging. Other Findings:  Subcutaneous edema. IMPRESSION: No evidence of DVT within either lower extremity. Bilateral subcutaneous edema. Electronically Signed   By: Genevive Bi M.D.   On: 10/15/2016 10:55   US Abdomen Limited Ruq  Result Date: 10/24/2016 CLINICAL DATA:  Right upper quadrant pain. EXAM: ULTRASOUND ABDOMEN LIMITED RIGHT UPPER QUADRANT COMPARISON:  MRI 06/01/2016.  CT 06/18/2014 . FINDINGS: Gallbladder: Cholecystectomy. Common bile duct: Diameter: 6.7 mm Liver: Increased echogenicity of the liver noted consistent fatty infiltration and/or hepatocellular disease. A 1.1 cm simple cyst in the left hepatic lobe. Questionable very mild ascites. Tiny right pleural effusion. IMPRESSION: 1. Cholecystectomy.  No biliary distention. 2. Increased hepatic echogenicity consistent fatty infiltration and/or hepatocellular disease. Questions mild ascites. Small right pleural effusion. Electronically Signed   By: Maisie Fus  Register   On: 10/24/2016 10:05     CBC  Recent Labs Lab 10/24/16 1413 10/26/16 0911 10/29/16 1831 10/30/16 0224  WBC 5.3 4.3 7.5 5.9  HGB 10.5* 9.1* 10.4* 9.8*  HCT 31.6* 27.7* 31.0* 29.0*  PLT 118* 101* 162 156  MCV 102.3* 104.5* 104.4* 104.6*  MCH 34.0 34.3* 35.2* 35.4*  MCHC 33.2 32.9 33.7 33.8  RDW 15.0 15.1 15.0* 15.0*  LYMPHSABS  --  2.2  --   --   MONOABS  --  0.5  --   --   EOSABS  --  0.2  --   --   BASOSABS  --  0.0  --   --     Chemistries   Recent Labs Lab 10/23/16 1532 10/23/16 1935 10/24/16 1413 10/26/16 0911 10/29/16 1831 10/30/16 0224  NA 146*  --  145 143 141 143  K 3.2*  --  3.8 3.4* 3.7 3.7  CL 118*  --  117* 113* 111 118*  CO2 22  --  22 23 25  21*  GLUCOSE 89  --  79 85 78 74  BUN 9  --  8 10 17 15   CREATININE 0.53  --  0.44 0.48 0.61 0.52  CALCIUM 7.3*  --  7.5* 7.7* 8.1* 7.8*  MG  --  1.6*  --  1.7  --   --  AST 61*  62*  --  74* 23 35 33  ALT 72*  71*  --  78* 46 47 42  ALKPHOS 126  --  188* 138* 150*  139*  BILITOT 0.9  --  1.0 0.8 0.9 0.6   ------------------------------------------------------------------------------------------------------------------ estimated creatinine clearance is 92.7 mL/min (by C-G formula based on SCr of 0.52 mg/dL). ------------------------------------------------------------------------------------------------------------------ No results for input(s): HGBA1C in the last 72 hours. ------------------------------------------------------------------------------------------------------------------ No results for input(s): CHOL, HDL, LDLCALC, TRIG, CHOLHDL, LDLDIRECT in the last 72 hours. ------------------------------------------------------------------------------------------------------------------ No results for input(s): TSH, T4TOTAL, T3FREE, THYROIDAB in the last 72 hours.  Invalid input(s): FREET3 ------------------------------------------------------------------------------------------------------------------ No results for input(s): VITAMINB12, FOLATE, FERRITIN, TIBC, IRON, RETICCTPCT in the last 72 hours.  Coagulation profile  Recent Labs Lab 10/23/16 1532 10/29/16 1831 10/30/16 0224  INR 1.29 1.04 1.05    No results for input(s): DDIMER in the last 72 hours.  Cardiac Enzymes No results for input(s): CKMB, TROPONINI, MYOGLOBIN in the last 168 hours.  Invalid input(s): CK ------------------------------------------------------------------------------------------------------------------ Invalid input(s): POCBNP    Assessment & Plan   This is a 41 y.o. female with a history of anxiety, hypertension, neuromuscular disorder, hypothyroidism,GERD, IBS, chronic lower extremity edema with stasis dermatitis osteoporosis and AVN of the right hip now being admitted with:  #. Weakness, gait instability -  PT and social work eval as patient lives alone and likely needs assistance - Patient was seen by physical therapy on 10/26/2016 and they  recommended home health PT - Patient has been in and out of the hospital   #. History of AVN of the hip - Pain control  #. Anemia, chronic and stable - Monitor CBC  #. Severe protein malnutrition - Dietary consult as previously  #. History of liver disease - Continue Lasix, lactulose - No need for GI consult patient currently stable   #. History of hypothyroidism - Continue Synthroid  #. History of GERD - Continue Protonix     Code Status Orders        Start     Ordered   10/30/16 0200  Full code  Continuous     10/30/16 0159    Code Status History    Date Active Date Inactive Code Status Order ID Comments User Context   10/22/2016  6:25 PM 10/26/2016  4:00 PM Full Code [id number]  Pieter Partridge, MD Inpatient   10/22/2016 12:27 PM 10/22/2016  6:25 PM Full Code [id number]  Melene Plan, DO ED   10/14/2016  9:19 PM 10/16/2016  3:42 PM Full Code [id number]  Katharina Caper, MD Inpatient   09/27/2016  6:11 PM 09/29/2016  9:00 PM Full Code [id number]  Altamese Dilling, MD Inpatient   06/01/2016  6:34 PM 06/03/2016 10:42 PM Full Code [id number]  Russella Dar, NP Inpatient   02/11/2016  2:22 AM 02/11/2016 10:15 PM Full Code [id number]  Eduard Clos, MD Inpatient           Consults  none  DVT Prophylaxis  Lovenox   Lab Results  Component Value Date   PLT 156 10/30/2016     Time Spent in minutes  Greater than 50% of time spent in care coordination and counseling patient regarding the condition and plan of care.   Auburn Bilberry M.D on 10/30/2016 at 3:01 PM  Between 7am to 6pm - Pager - [phone]  After 6pm go to [email or web address] - password [email or web address] [facility]  [facility]   Office  [phone]

## 2016-10-31 DIAGNOSIS — R2681 Unsteadiness on feet: Secondary | ICD-10-CM | POA: Diagnosis not present

## 2016-10-31 DIAGNOSIS — D649 Anemia, unspecified: Secondary | ICD-10-CM | POA: Diagnosis not present

## 2016-10-31 DIAGNOSIS — M8700 Idiopathic aseptic necrosis of unspecified bone: Secondary | ICD-10-CM | POA: Diagnosis not present

## 2016-10-31 DIAGNOSIS — R531 Weakness: Secondary | ICD-10-CM | POA: Diagnosis not present

## 2016-10-31 DIAGNOSIS — E43 Unspecified severe protein-calorie malnutrition: Secondary | ICD-10-CM | POA: Diagnosis not present

## 2016-10-31 MED ORDER — PREMIER PROTEIN SHAKE
11.0000 [oz_av] | Freq: Three times a day (TID) | ORAL | Status: DC
  Administered 2016-10-31 – 2016-11-06 (×12): 11 [oz_av] via ORAL

## 2016-10-31 NOTE — Progress Notes (Signed)
Initial Nutrition Assessment  DOCUMENTATION CODES:   Severe malnutrition in context of chronic illness, Obesity unspecified  INTERVENTION:  Provide Premier Protein po TID, each supplement provides 160 kcal and 30 grams of protein.  Continue MVI BID until 9/4 (at home patient has been taking chewable MVI, but not on formulary here). After 9/4 recommend switching to once daily.  Continue omega-3 supplement once daily.  Also recommend 500 mg calcium citrate with vitamin D3 TID.  Reviewed education provided as previous admission. Encouraged intake of small, frequent meals that contain adequate protein.   NUTRITION DIAGNOSIS:   Malnutrition (Severe) related to chronic illness (hx Roux-en-Y gastric bypass in 2003) as evidenced by energy intake < or equal to 75% for > or equal to 1 month, moderate depletion of body fat, moderate depletions of muscle mass, moderate to severe fluid accumulation.  GOAL:   Patient will meet greater than or equal to 90% of their needs  MONITOR:   PO intake, Supplement acceptance, Labs, Weight trends, Skin, I & O's  REASON FOR ASSESSMENT:   Consult Assessment of nutrition requirement/status  ASSESSMENT:   41 year old female with PMHx of anxiety, hypothyroidism, anemia, anxiety, arthritis, acid reflux, HTN, osteoporosis, hx of Roux-en-Y gastric bypass in 2003, recent diagnosis of clavicular fracture, avascular necrosis of hip, chronic lower extremity edema with stasis dermatitis, who presents for evaluation of weakness and fall.   -Of note pt with recent admission 8/12-8/16 to [facility] in setting of tylenol overdose. -Undergoing bed search for SNF.  Spoke with patient at bedside. She is known to this RD from previous admission. She had a Roux-en-Y gastric bypass in May 2003 at [facility] in [place], [place]. Her appetite remains poor. She continues to only eat 2 meals per day. She enjoys cheese omelette with 2 eggs and one slice  of cheese. Also prepares soups, such as beef and vegetable soup. She did not purchase any oral nutrition supplements for use at home. She did begin taking a chewable multivitamin with minerals BID (though patient reports there were some days she might have taken the MVI four times daily).   Dry weight is 174 lbs (79.1 kg). She weighed 300 lbs prior to surgery in 2003. Weight fluctuates in setting of fluid.  Medications reviewed and include: Lasix 40 mg daily, lactulose 20 grams daily, levothyroxine, MVI BID, omega-3 acid ethyl esters 1 gram daily, pantoprazole, potassium chloride 20 mEq daily.  Labs reviewed: Chloride 118, CO2 21, Alkaline Phosphatase 139, Albumin 1.4, Total Protein 3.9, Ammonia 87.  Nutrition-Focused physical exam completed. Findings are moderate fat depletion, moderate-severe muscle depletion, and moderate edema. Right side of face swollen. Patient continues to have dry, dull hair, but it does appear less brittle than on previous assessment. Pallor skin, xerosis of skin, and spoon-shaped nails still present. Patient is at risk for deficiency of protein, iron, essential fatty acids, thiamine, vitamin B12, vitamin B6, and copper.  Diet Order:  Diet regular Room service appropriate? Yes; Fluid consistency: Thin  Skin:  Wound (see comment) (non-pressure wounds to right buttocks)  Last BM:  10/29/2016  Height:   Ht Readings from Last 1 Encounters:  10/30/16 5\' 1"  (1.549 m)    Weight:   Wt Readings from Last 1 Encounters:  10/30/16 195 lb 1.6 oz (88.5 kg)    Ideal Body Weight:  47.7 kg  BMI:  Body mass index is 36.86 kg/m.  Estimated Nutritional Needs:   Kcal:  1810-2090 (MSJ x 1.3-1.5)  Protein:  103-120 grams (1.3-1.5 grams/kg)  Fluid:  1.8-2 L/day (1 ml/kcal)  EDUCATION NEEDS:   No education needs identified at this time  Helane Rima, MS, RD, LDN Pager: [phone] After Hours Pager: [phone]

## 2016-10-31 NOTE — Progress Notes (Signed)
[employer] - [place] [facility]                                                                                                                                                                                  Patient Demographics   Dorothy Dyer, is a 41 y.o. female, DOB - May 12, 1975, ZOX:[id number]  Admit date - 10/29/2016   Admitting Physician Tonye Royalty, DO  Outpatient Primary MD for the patient is Patient, No Pcp Per   LOS - 0  Subjective: Still complains of weakness    Review of Systems:   CONSTITUTIONAL: No documented fever. No fatigue, Positive weakness. No weight gain, no weight loss.  EYES: No blurry or double vision.  ENT: No tinnitus. No postnasal drip. No redness of the oropharynx.  RESPIRATORY: No cough, no wheeze, no hemoptysis. No dyspnea.  CARDIOVASCULAR: No chest pain. No orthopnea. No palpitations. No syncope.  GASTROINTESTINAL: No nausea, no vomiting or diarrhea. No abdominal pain. No melena or hematochezia.  GENITOURINARY: No dysuria or hematuria.  ENDOCRINE: No polyuria or nocturia. No heat or cold intolerance.  HEMATOLOGY: No anemia. No bruising. No bleeding.  INTEGUMENTARY: No rashes. No lesions.  MUSCULOSKELETAL: Pain all over NEUROLOGIC: No numbness, tingling, or ataxia. No seizure-type activity.  PSYCHIATRIC: No anxiety. No insomnia. No ADD.    Vitals:   Vitals:   10/30/16 0451 10/30/16 1200 10/30/16 2038 10/31/16 0657  BP: 108/68 103/66 92/61 110/70  Pulse: 98 95 93 85  Resp:   20 20  Temp:  97.6 F (36.4 C) 97.6 F (36.4 C) 98.1 F (36.7 C)  TempSrc:  Axillary Oral Oral  SpO2:  100% 100% 100%  Weight:      Height:        Wt Readings from Last 3 Encounters:  10/30/16 195 lb 1.6 oz (88.5 kg)  10/26/16 207 lb 10.8 oz (94.2 kg)  10/15/16 181 lb (82.1 kg)     Intake/Output Summary (Last 24 hours) at 10/31/16 1237 Last data filed at 10/31/16 0645  Gross per 24 hour  Intake                3 ml  Output               600 ml  Net             -597 ml    Physical Exam:   GENERAL:no apparent distress.  HEAD, EYES, EARS, NOSE AND THROAT: Atraumatic, normocephalic. Extraocular muscles are intact. Pupils equal and reactive to light. Sclerae anicteric. No conjunctival injection. No oro-pharyngeal erythema.  NECK: Supple. There is no jugular venous distention. No bruits, no lymphadenopathy, no thyromegaly.  HEART:  Regular rate and rhythm,. No murmurs, no rubs, no clicks.  LUNGS: Clear to auscultation bilaterally. No rales or rhonchi. No wheezes.  ABDOMEN: Soft, flat, nontender, nondistended. Has good bowel sounds. No hepatosplenomegaly appreciated.  EXTREMITIES: No evidence of any cyanosis, clubbing, or peripheral edema.  +2 pedal and radial pulses bilaterally. His lower extremity dressing in place NEUROLOGIC: The patient is alert, awake, and oriented x3 with no focal motor or sensory deficits appreciated bilaterally.  SKIN: Moist and warm with no rashes appreciated.  Psych: Not anxious, depressed LN: No inguinal LN enlargement    Antibiotics   Anti-infectives    Start     Dose/Rate Route Frequency Ordered Stop   10/30/16 0159  doxycycline (VIBRA-TABS) tablet 100 mg  Status:  Discontinued     100 mg Oral Every 12 hours 10/30/16 0159 10/30/16 1328      Medications   Scheduled Meds: . furosemide  40 mg Oral Daily  . lactulose  20 g Oral Daily  . levothyroxine  125 mcg Oral QODAY  . levothyroxine  137 mcg Oral QODAY  . multivitamin with minerals  1 tablet Oral BID  . naproxen  500 mg Oral BID WC  . nortriptyline  75 mg Oral QHS  . omega-3 acid ethyl esters  1 g Oral Daily  . pantoprazole  40 mg Oral Daily  . potassium chloride SA  20 mEq Oral Daily  . pregabalin  225 mg Oral BID  . protein supplement shake  11 oz Oral TID BM  . sodium chloride flush  3 mL Intravenous Q12H   Continuous Infusions: . sodium chloride     PRN Meds:.sodium chloride, albuterol, bisacodyl, ipratropium,  magnesium citrate, ondansetron **OR** ondansetron (ZOFRAN) IV, oxyCODONE-acetaminophen, senna-docusate, simethicone, sodium chloride flush, traMADol   Data Review:   Micro Results No results found for this or any previous visit (from the past 240 hour(s)).  Radiology Reports Dg Chest 1 View  Result Date: 10/14/2016 CLINICAL DATA:  Recent discharge from hospital, now unable to walk or stand independently. pt is having weakness and bilat lower extremity pain and swelling. Pt also has skin sloughing with bilat ulcerated areas to her lower legs and feet. EXAM: CHEST 1 VIEW COMPARISON:  Chest x-ray dated 09/27/2016. FINDINGS: Study is hypoinspiratory with crowding of the perihilar bronchovascular markings. Given the low lung volumes, lungs appear clear. No pleural effusion or pneumothorax seen. Heart size and mediastinal contours are normal. No acute or suspicious osseous finding. IMPRESSION: Low lung volumes. No active disease. No evidence of pneumonia or pulmonary edema. Electronically Signed   By: Bary Richard M.D.   On: 10/14/2016 17:32   Ct Humerus Right W Contrast  Result Date: 10/24/2016 CLINICAL DATA:  Right arm swelling and erythema with contracture of the right hand in the setting of a right clavicular fracture and right radial nerve palsy. Concern for infection or compartment syndrome. EXAM: CT OF THE UPPER RIGHT EXTREMITY WITH CONTRAST TECHNIQUE: Multidetector CT imaging of the right humerus and forearm was performed according to the standard protocol following intravenous contrast administration. COMPARISON:  Right humerus x-rays dated August 23, 2016. CONTRAST:  ISOVUE-300 IOPAMIDOL (ISOVUE-300) INJECTION 61% FINDINGS: Bones/Joint/Cartilage There is a subacute, comminuted fracture of the midclavicle with approximately 1 shaft bone width posterior displacement of the distal fragment. There is surrounding callus formation. No additional fractures.  No glenohumeral or elbow joint effusion.  Ligaments Suboptimally assessed by CT. Muscles and Tendons Calcification adjacent to the posterior greater tuberosity in the region of  the distal infraspinatus tendon. Soft tissues Diffuse anasarca in the visualized soft tissues. No drainable fluid collection. There is mild mass effect on the right subclavian vein as it passes behind the healing clavicle fracture. The vein appears patent. No axillary lymphadenopathy.  The visualized right lung is clear. IMPRESSION: 1. Diffuse anasarca in the visualized soft tissues, including the right arm and right chest and abdominal wall. No discrete drainable fluid collection is seen. Please note that the diagnosis of compartment syndrome cannot be established by imaging. 2. Subacute, healing comminuted fracture of the mid clavicle with approximately 1 shaft bone width posterior displacement of the distal fragment. 3. Mild mass effect on the right subclavian vein as it passes behind the healing clavicle fracture. The vein appears patent. If there is clinical concern for DVT, recommend further evaluation with ultrasound. 4. Probable calcific tendinitis of the infraspinatus tendon. Electronically Signed   By: Obie Dredge M.D.   On: 10/24/2016 08:25   Ct Forearm Right W Contrast  Result Date: 10/24/2016 CLINICAL DATA:  Right arm swelling and erythema with contracture of the right hand in the setting of a right clavicular fracture and right radial nerve palsy. Concern for infection or compartment syndrome. EXAM: CT OF THE UPPER RIGHT EXTREMITY WITH CONTRAST TECHNIQUE: Multidetector CT imaging of the right humerus and forearm was performed according to the standard protocol following intravenous contrast administration. COMPARISON:  Right humerus x-rays dated August 23, 2016. CONTRAST:  ISOVUE-300 IOPAMIDOL (ISOVUE-300) INJECTION 61% FINDINGS: Bones/Joint/Cartilage There is a subacute, comminuted fracture of the midclavicle with approximately 1 shaft bone width posterior  displacement of the distal fragment. There is surrounding callus formation. No additional fractures.  No glenohumeral or elbow joint effusion. Ligaments Suboptimally assessed by CT. Muscles and Tendons Calcification adjacent to the posterior greater tuberosity in the region of the distal infraspinatus tendon. Soft tissues Diffuse anasarca in the visualized soft tissues. No drainable fluid collection. There is mild mass effect on the right subclavian vein as it passes behind the healing clavicle fracture. The vein appears patent. No axillary lymphadenopathy.  The visualized right lung is clear. IMPRESSION: 1. Diffuse anasarca in the visualized soft tissues, including the right arm and right chest and abdominal wall. No discrete drainable fluid collection is seen. Please note that the diagnosis of compartment syndrome cannot be established by imaging. 2. Subacute, healing comminuted fracture of the mid clavicle with approximately 1 shaft bone width posterior displacement of the distal fragment. 3. Mild mass effect on the right subclavian vein as it passes behind the healing clavicle fracture. The vein appears patent. If there is clinical concern for DVT, recommend further evaluation with ultrasound. 4. Probable calcific tendinitis of the infraspinatus tendon. Electronically Signed   By: Obie Dredge M.D.   On: 10/24/2016 08:25   US Venous Img Lower Bilateral  Result Date: 10/15/2016 CLINICAL DATA:  Leg swelling. EXAM: BILATERAL LOWER EXTREMITY VENOUS DOPPLER ULTRASOUND TECHNIQUE: Gray-scale sonography with graded compression, as well as color Doppler and duplex ultrasound were performed to evaluate the lower extremity deep venous systems from the level of the common femoral vein and including the common femoral, femoral, profunda femoral, popliteal and calf veins including the posterior tibial, peroneal and gastrocnemius veins when visible. The superficial great saphenous vein was also interrogated. Spectral  Doppler was utilized to evaluate flow at rest and with distal augmentation maneuvers in the common femoral, femoral and popliteal veins. COMPARISON:  Knee MRI 06/01/2016 FINDINGS: RIGHT LOWER EXTREMITY Common Femoral Vein: No evidence of  thrombus. Normal compressibility, respiratory phasicity and response to augmentation. Saphenofemoral Junction: No evidence of thrombus. Normal compressibility and flow on color Doppler imaging. Profunda Femoral Vein: No evidence of thrombus. Normal compressibility and flow on color Doppler imaging. Femoral Vein: No evidence of thrombus. Normal compressibility, respiratory phasicity and response to augmentation. Popliteal Vein: No evidence of thrombus. Normal compressibility, respiratory phasicity and response to augmentation. Calf Veins: No evidence of thrombus. Normal compressibility and flow on color Doppler imaging. Superficial Great Saphenous Vein: No evidence of thrombus. Normal compressibility and flow on color Doppler imaging. Other Findings:  Subcutaneous edema. LEFT LOWER EXTREMITY Common Femoral Vein: No evidence of thrombus. Normal compressibility, respiratory phasicity and response to augmentation. Saphenofemoral Junction: No evidence of thrombus. Normal compressibility and flow on color Doppler imaging. Profunda Femoral Vein: No evidence of thrombus. Normal compressibility and flow on color Doppler imaging. Femoral Vein: No evidence of thrombus. Normal compressibility, respiratory phasicity and response to augmentation. Popliteal Vein: No evidence of thrombus. Normal compressibility, respiratory phasicity and response to augmentation. Calf Veins: No evidence of thrombus. Normal compressibility and flow on color Doppler imaging. Superficial Great Saphenous Vein: No evidence of thrombus. Normal compressibility and flow on color Doppler imaging. Other Findings:  Subcutaneous edema. IMPRESSION: No evidence of DVT within either lower extremity. Bilateral subcutaneous edema.  Electronically Signed   By: Genevive Bi M.D.   On: 10/15/2016 10:55   US Abdomen Limited Ruq  Result Date: 10/24/2016 CLINICAL DATA:  Right upper quadrant pain. EXAM: ULTRASOUND ABDOMEN LIMITED RIGHT UPPER QUADRANT COMPARISON:  MRI 06/01/2016.  CT 06/18/2014 . FINDINGS: Gallbladder: Cholecystectomy. Common bile duct: Diameter: 6.7 mm Liver: Increased echogenicity of the liver noted consistent fatty infiltration and/or hepatocellular disease. A 1.1 cm simple cyst in the left hepatic lobe. Questionable very mild ascites. Tiny right pleural effusion. IMPRESSION: 1. Cholecystectomy.  No biliary distention. 2. Increased hepatic echogenicity consistent fatty infiltration and/or hepatocellular disease. Questions mild ascites. Small right pleural effusion. Electronically Signed   By: Maisie Fus  Register   On: 10/24/2016 10:05     CBC  Recent Labs Lab 10/24/16 1413 10/26/16 0911 10/29/16 1831 10/30/16 0224  WBC 5.3 4.3 7.5 5.9  HGB 10.5* 9.1* 10.4* 9.8*  HCT 31.6* 27.7* 31.0* 29.0*  PLT 118* 101* 162 156  MCV 102.3* 104.5* 104.4* 104.6*  MCH 34.0 34.3* 35.2* 35.4*  MCHC 33.2 32.9 33.7 33.8  RDW 15.0 15.1 15.0* 15.0*  LYMPHSABS  --  2.2  --   --   MONOABS  --  0.5  --   --   EOSABS  --  0.2  --   --   BASOSABS  --  0.0  --   --     Chemistries   Recent Labs Lab 10/24/16 1413 10/26/16 0911 10/29/16 1831 10/30/16 0224  NA 145 143 141 143  K 3.8 3.4* 3.7 3.7  CL 117* 113* 111 118*  CO2 22 23 25  21*  GLUCOSE 79 85 78 74  BUN 8 10 17 15   CREATININE 0.44 0.48 0.61 0.52  CALCIUM 7.5* 7.7* 8.1* 7.8*  MG  --  1.7  --   --   AST 74* 23 35 33  ALT 78* 46 47 42  ALKPHOS 188* 138* 150* 139*  BILITOT 1.0 0.8 0.9 0.6   ------------------------------------------------------------------------------------------------------------------ estimated creatinine clearance is 92.7 mL/min (by C-G formula based on SCr of 0.52  mg/dL). ------------------------------------------------------------------------------------------------------------------ No results for input(s): HGBA1C in the last 72 hours. ------------------------------------------------------------------------------------------------------------------ No results for input(s): CHOL, HDL, LDLCALC, TRIG,  CHOLHDL, LDLDIRECT in the last 72 hours. ------------------------------------------------------------------------------------------------------------------ No results for input(s): TSH, T4TOTAL, T3FREE, THYROIDAB in the last 72 hours.  Invalid input(s): FREET3 ------------------------------------------------------------------------------------------------------------------ No results for input(s): VITAMINB12, FOLATE, FERRITIN, TIBC, IRON, RETICCTPCT in the last 72 hours.  Coagulation profile  Recent Labs Lab 10/29/16 1831 10/30/16 0224  INR 1.04 1.05    No results for input(s): DDIMER in the last 72 hours.  Cardiac Enzymes No results for input(s): CKMB, TROPONINI, MYOGLOBIN in the last 168 hours.  Invalid input(s): CK ------------------------------------------------------------------------------------------------------------------ Invalid input(s): POCBNP    Assessment & Plan   This is a 41 y.o. female with a history of anxiety, hypertension, neuromuscular disorder, hypothyroidism,GERD, IBS, chronic lower extremity edema with stasis dermatitis osteoporosis and AVN of the right hip now being admitted with:  #. Weakness, gait instability -  Seen by PT skill nursing facility recommended, needs OT - Weighted insurance authorization  #. History of AVN of the hip - Pain control  #. Anemia, chronic and stable - Monitor CBC  #. Severe protein malnutrition - Dietary consult as previously  #. History of liver disease - Continue Lasix, lactulose - No need for GI consult patient currently stable   #. History of hypothyroidism -  Continue Synthroid  #. History of GERD - Continue Protonix     Code Status Orders        Start     Ordered   10/30/16 0200  Full code  Continuous     10/30/16 0159    Code Status History    Date Active Date Inactive Code Status Order ID Comments User Context   10/22/2016  6:25 PM 10/26/2016  4:00 PM Full Code [id number]  Pieter Partridge, MD Inpatient   10/22/2016 12:27 PM 10/22/2016  6:25 PM Full Code [id number]  Melene Plan, DO ED   10/14/2016  9:19 PM 10/16/2016  3:42 PM Full Code [id number]  Katharina Caper, MD Inpatient   09/27/2016  6:11 PM 09/29/2016  9:00 PM Full Code [id number]  Altamese Dilling, MD Inpatient   06/01/2016  6:34 PM 06/03/2016 10:42 PM Full Code [id number]  Russella Dar, NP Inpatient   02/11/2016  2:22 AM 02/11/2016 10:15 PM Full Code [id number]  Eduard Clos, MD Inpatient           Consults  none  DVT Prophylaxis  Lovenox   Lab Results  Component Value Date   PLT 156 10/30/2016     Time Spent in minutes  Greater than 50% of time spent in care coordination and counseling patient regarding the condition and plan of care.   Auburn Bilberry M.D on 10/31/2016 at 12:37 PM  Between 7am to 6pm - Pager - [phone]  After 6pm go to [email or web address] - password [email or web address]  [facility]   Office  [phone]

## 2016-11-01 ENCOUNTER — Encounter: Payer: Self-pay | Admitting: Internal Medicine

## 2016-11-01 DIAGNOSIS — M1990 Unspecified osteoarthritis, unspecified site: Secondary | ICD-10-CM | POA: Diagnosis present

## 2016-11-01 DIAGNOSIS — G709 Myoneural disorder, unspecified: Secondary | ICD-10-CM | POA: Diagnosis not present

## 2016-11-01 DIAGNOSIS — E43 Unspecified severe protein-calorie malnutrition: Secondary | ICD-10-CM | POA: Diagnosis not present

## 2016-11-01 DIAGNOSIS — Z5189 Encounter for other specified aftercare: Secondary | ICD-10-CM | POA: Diagnosis not present

## 2016-11-01 DIAGNOSIS — K219 Gastro-esophageal reflux disease without esophagitis: Secondary | ICD-10-CM | POA: Diagnosis present

## 2016-11-01 DIAGNOSIS — R531 Weakness: Secondary | ICD-10-CM | POA: Diagnosis not present

## 2016-11-01 DIAGNOSIS — W1839XA Other fall on same level, initial encounter: Secondary | ICD-10-CM | POA: Diagnosis present

## 2016-11-01 DIAGNOSIS — E7220 Disorder of urea cycle metabolism, unspecified: Secondary | ICD-10-CM | POA: Diagnosis not present

## 2016-11-01 DIAGNOSIS — R1084 Generalized abdominal pain: Secondary | ICD-10-CM | POA: Diagnosis present

## 2016-11-01 DIAGNOSIS — R41841 Cognitive communication deficit: Secondary | ICD-10-CM | POA: Diagnosis not present

## 2016-11-01 DIAGNOSIS — F1910 Other psychoactive substance abuse, uncomplicated: Secondary | ICD-10-CM | POA: Diagnosis present

## 2016-11-01 DIAGNOSIS — G8929 Other chronic pain: Secondary | ICD-10-CM | POA: Diagnosis present

## 2016-11-01 DIAGNOSIS — K7200 Acute and subacute hepatic failure without coma: Secondary | ICD-10-CM | POA: Diagnosis present

## 2016-11-01 DIAGNOSIS — M6281 Muscle weakness (generalized): Secondary | ICD-10-CM | POA: Diagnosis not present

## 2016-11-01 DIAGNOSIS — M810 Age-related osteoporosis without current pathological fracture: Secondary | ICD-10-CM | POA: Diagnosis present

## 2016-11-01 DIAGNOSIS — I10 Essential (primary) hypertension: Secondary | ICD-10-CM | POA: Diagnosis present

## 2016-11-01 DIAGNOSIS — F1010 Alcohol abuse, uncomplicated: Secondary | ICD-10-CM | POA: Diagnosis present

## 2016-11-01 DIAGNOSIS — E039 Hypothyroidism, unspecified: Secondary | ICD-10-CM | POA: Diagnosis present

## 2016-11-01 DIAGNOSIS — Z743 Need for continuous supervision: Secondary | ICD-10-CM | POA: Diagnosis not present

## 2016-11-01 DIAGNOSIS — K769 Liver disease, unspecified: Secondary | ICD-10-CM | POA: Diagnosis not present

## 2016-11-01 DIAGNOSIS — X58XXXA Exposure to other specified factors, initial encounter: Secondary | ICD-10-CM | POA: Diagnosis present

## 2016-11-01 DIAGNOSIS — K760 Fatty (change of) liver, not elsewhere classified: Secondary | ICD-10-CM | POA: Diagnosis present

## 2016-11-01 DIAGNOSIS — M8700 Idiopathic aseptic necrosis of unspecified bone: Secondary | ICD-10-CM | POA: Diagnosis not present

## 2016-11-01 DIAGNOSIS — R600 Localized edema: Secondary | ICD-10-CM | POA: Diagnosis not present

## 2016-11-01 DIAGNOSIS — K7290 Hepatic failure, unspecified without coma: Secondary | ICD-10-CM | POA: Diagnosis not present

## 2016-11-01 DIAGNOSIS — Z886 Allergy status to analgesic agent status: Secondary | ICD-10-CM | POA: Diagnosis not present

## 2016-11-01 DIAGNOSIS — F17210 Nicotine dependence, cigarettes, uncomplicated: Secondary | ICD-10-CM | POA: Diagnosis present

## 2016-11-01 DIAGNOSIS — R601 Generalized edema: Secondary | ICD-10-CM | POA: Diagnosis not present

## 2016-11-01 DIAGNOSIS — Z862 Personal history of diseases of the blood and blood-forming organs and certain disorders involving the immune mechanism: Secondary | ICD-10-CM | POA: Diagnosis not present

## 2016-11-01 DIAGNOSIS — R6889 Other general symptoms and signs: Secondary | ICD-10-CM | POA: Diagnosis not present

## 2016-11-01 DIAGNOSIS — K7682 Hepatic encephalopathy: Secondary | ICD-10-CM | POA: Diagnosis present

## 2016-11-01 DIAGNOSIS — G9340 Encephalopathy, unspecified: Secondary | ICD-10-CM | POA: Diagnosis not present

## 2016-11-01 DIAGNOSIS — I872 Venous insufficiency (chronic) (peripheral): Secondary | ICD-10-CM | POA: Diagnosis present

## 2016-11-01 DIAGNOSIS — K7460 Unspecified cirrhosis of liver: Secondary | ICD-10-CM | POA: Diagnosis not present

## 2016-11-01 DIAGNOSIS — R488 Other symbolic dysfunctions: Secondary | ICD-10-CM | POA: Diagnosis not present

## 2016-11-01 DIAGNOSIS — R269 Unspecified abnormalities of gait and mobility: Secondary | ICD-10-CM | POA: Diagnosis not present

## 2016-11-01 DIAGNOSIS — R278 Other lack of coordination: Secondary | ICD-10-CM | POA: Diagnosis not present

## 2016-11-01 DIAGNOSIS — D649 Anemia, unspecified: Secondary | ICD-10-CM | POA: Diagnosis not present

## 2016-11-01 LAB — COMPREHENSIVE METABOLIC PANEL
ALBUMIN: 1.2 g/dL — AB (ref 3.5–5.0)
ALT: 38 U/L (ref 14–54)
AST: 34 U/L (ref 15–41)
Alkaline Phosphatase: 138 U/L — ABNORMAL HIGH (ref 38–126)
Anion gap: 3 — ABNORMAL LOW (ref 5–15)
BUN: 16 mg/dL (ref 6–20)
CHLORIDE: 113 mmol/L — AB (ref 101–111)
CO2: 26 mmol/L (ref 22–32)
Calcium: 7.6 mg/dL — ABNORMAL LOW (ref 8.9–10.3)
Creatinine, Ser: 0.55 mg/dL (ref 0.44–1.00)
GFR calc Af Amer: >60 mL/min (ref >60)
GFR calc non Af Amer: >60 mL/min (ref >60)
GLUCOSE: 84 mg/dL (ref 65–99)
POTASSIUM: 4 mmol/L (ref 3.5–5.1)
Sodium: 142 mmol/L (ref 135–145)
Total Bilirubin: 0.6 mg/dL (ref 0.3–1.2)
Total Protein: 3.8 g/dL — ABNORMAL LOW (ref 6.5–8.1)

## 2016-11-01 LAB — AMMONIA: AMMONIA: 169 umol/L — AB (ref 9–35)

## 2016-11-01 MED ORDER — FUROSEMIDE 40 MG PO TABS
40.0000 mg | ORAL_TABLET | Freq: Two times a day (BID) | ORAL | Status: DC
  Administered 2016-11-01 – 2016-11-07 (×12): 40 mg via ORAL
  Filled 2016-11-01 (×12): qty 1

## 2016-11-01 MED ORDER — LACTULOSE 10 GM/15ML PO SOLN
20.0000 g | Freq: Three times a day (TID) | ORAL | Status: DC
  Administered 2016-11-01 – 2016-11-02 (×6): 20 g via ORAL
  Filled 2016-11-01 (×7): qty 30

## 2016-11-01 MED ORDER — RIFAXIMIN 550 MG PO TABS
550.0000 mg | ORAL_TABLET | Freq: Two times a day (BID) | ORAL | Status: DC
  Administered 2016-11-01 – 2016-11-07 (×13): 550 mg via ORAL
  Filled 2016-11-01 (×13): qty 1

## 2016-11-01 MED ORDER — FENTANYL 25 MCG/HR TD PT72: 25.0000 ug | MEDICATED_PATCH | Freq: Once | TRANSDERMAL | Status: DC

## 2016-11-01 MED ORDER — ENSURE ENLIVE PO LIQD
237.0000 mL | Freq: Three times a day (TID) | ORAL | Status: DC
  Administered 2016-11-01 (×2): 237 mL via ORAL

## 2016-11-01 MED ORDER — ENOXAPARIN SODIUM 40 MG/0.4ML ~~LOC~~ SOLN
40.0000 mg | SUBCUTANEOUS | Status: DC
  Administered 2016-11-01 – 2016-11-06 (×6): 40 mg via SUBCUTANEOUS
  Filled 2016-11-01 (×6): qty 0.4

## 2016-11-01 NOTE — Progress Notes (Signed)
[employer] -  at [facility]                                                                                                                                                                                 Patient Demographics   Dorothy Dyer, is a 41 y.o. female, DOB - May 24, 1975, ZOX:[id number]  Admit date - 10/29/2016   Admitting Physician Tonye Royalty, DO  Outpatient Primary MD for the patient is Patient, No Pcp Per   LOS - 0  Subjective:  Confused at times. Weak. Poor PO intake  Review of Systems:   Cannot obtain due to confusion  Vitals:   Vitals:   10/31/16 2145 11/01/16 0509 11/01/16 1030 11/01/16 1419  BP: 100/61 (!) 100/55 109/77 97/60  Pulse: 87 86 100 88  Resp: 20 14 16    Temp: 97.6 F (36.4 C) 97.6 F (36.4 C)  97.7 F (36.5 C)  TempSrc: Oral Oral Oral Oral  SpO2: 100% 97% 100% 99%  Weight:      Height:        Wt Readings from Last 3 Encounters:  10/30/16 88.5 kg (195 lb 1.6 oz)  10/26/16 94.2 kg (207 lb 10.8 oz)  10/15/16 82.1 kg (181 lb)    No intake or output data in the 24 hours ending 11/01/16 1428  Physical Exam:   GENERAL:no apparent distress.  HEAD, EYES, EARS, NOSE AND THROAT: Atraumatic, normocephalic. Extraocular muscles are intact. Pupils equal and reactive to light. Sclerae anicteric. No conjunctival injection. No oro-pharyngeal erythema.  NECK: Supple. There is no jugular venous distention. No bruits, no lymphadenopathy, no thyromegaly.  HEART: Regular rate and rhythm,. No murmurs, no rubs, no clicks.  LUNGS: Clear to auscultation bilaterally. No rales or rhonchi. No wheezes.  ABDOMEN: Soft, flat, nontender, nondistended. Has good bowel sounds. No hepatosplenomegaly appreciated.  EXTREMITIES: Bilateral lower extremity edema with Unna boots in place NEUROLOGIC: The patient is alert, awake, and oriented x3 with no focal motor or sensory deficits appreciated bilaterally.  SKIN: Moist and warm with no rashes  appreciated.  Psych: Drowzy LN: No inguinal LN enlargement Has anasarca    Antibiotics   Anti-infectives    Start     Dose/Rate Route Frequency Ordered Stop   11/01/16 1530  rifaximin (XIFAXAN) tablet 550 mg     550 mg Oral 2 times daily 11/01/16 1419     10/30/16 0159  doxycycline (VIBRA-TABS) tablet 100 mg  Status:  Discontinued     100 mg Oral Every 12 hours 10/30/16 0159 10/30/16 1328      Medications   Scheduled Meds: . enoxaparin (LOVENOX) injection  40 mg Subcutaneous Q24H  . feeding supplement (ENSURE  ENLIVE)  237 mL Oral TID BM  . furosemide  40 mg Oral Daily  . lactulose  20 g Oral TID  . levothyroxine  125 mcg Oral QODAY  . levothyroxine  137 mcg Oral QODAY  . multivitamin with minerals  1 tablet Oral BID  . naproxen  500 mg Oral BID WC  . nortriptyline  75 mg Oral QHS  . omega-3 acid ethyl esters  1 g Oral Daily  . pantoprazole  40 mg Oral Daily  . potassium chloride SA  20 mEq Oral Daily  . pregabalin  225 mg Oral BID  . protein supplement shake  11 oz Oral TID BM  . rifaximin  550 mg Oral BID  . sodium chloride flush  3 mL Intravenous Q12H   Continuous Infusions: . sodium chloride     PRN Meds:.sodium chloride, albuterol, bisacodyl, ipratropium, magnesium citrate, ondansetron **OR** ondansetron (ZOFRAN) IV, oxyCODONE-acetaminophen, senna-docusate, simethicone, sodium chloride flush, traMADol   Data Review:   Micro Results No results found for this or any previous visit (from the past 240 hour(s)).  Radiology Reports Dg Chest 1 View  Result Date: 10/14/2016 CLINICAL DATA:  Recent discharge from hospital, now unable to walk or stand independently. pt is having weakness and bilat lower extremity pain and swelling. Pt also has skin sloughing with bilat ulcerated areas to her lower legs and feet. EXAM: CHEST 1 VIEW COMPARISON:  Chest x-ray dated 09/27/2016. FINDINGS: Study is hypoinspiratory with crowding of the perihilar bronchovascular markings. Given the  low lung volumes, lungs appear clear. No pleural effusion or pneumothorax seen. Heart size and mediastinal contours are normal. No acute or suspicious osseous finding. IMPRESSION: Low lung volumes. No active disease. No evidence of pneumonia or pulmonary edema. Electronically Signed   By: Bary Richard M.D.   On: 10/14/2016 17:32   Ct Humerus Right W Contrast  Result Date: 10/24/2016 CLINICAL DATA:  Right arm swelling and erythema with contracture of the right hand in the setting of a right clavicular fracture and right radial nerve palsy. Concern for infection or compartment syndrome. EXAM: CT OF THE UPPER RIGHT EXTREMITY WITH CONTRAST TECHNIQUE: Multidetector CT imaging of the right humerus and forearm was performed according to the standard protocol following intravenous contrast administration. COMPARISON:  Right humerus x-rays dated August 23, 2016. CONTRAST:  ISOVUE-300 IOPAMIDOL (ISOVUE-300) INJECTION 61% FINDINGS: Bones/Joint/Cartilage There is a subacute, comminuted fracture of the midclavicle with approximately 1 shaft bone width posterior displacement of the distal fragment. There is surrounding callus formation. No additional fractures.  No glenohumeral or elbow joint effusion. Ligaments Suboptimally assessed by CT. Muscles and Tendons Calcification adjacent to the posterior greater tuberosity in the region of the distal infraspinatus tendon. Soft tissues Diffuse anasarca in the visualized soft tissues. No drainable fluid collection. There is mild mass effect on the right subclavian vein as it passes behind the healing clavicle fracture. The vein appears patent. No axillary lymphadenopathy.  The visualized right lung is clear. IMPRESSION: 1. Diffuse anasarca in the visualized soft tissues, including the right arm and right chest and abdominal wall. No discrete drainable fluid collection is seen. Please note that the diagnosis of compartment syndrome cannot be established by imaging. 2. Subacute,  healing comminuted fracture of the mid clavicle with approximately 1 shaft bone width posterior displacement of the distal fragment. 3. Mild mass effect on the right subclavian vein as it passes behind the healing clavicle fracture. The vein appears patent. If there is clinical concern for DVT, recommend  further evaluation with ultrasound. 4. Probable calcific tendinitis of the infraspinatus tendon. Electronically Signed   By: Obie Dredge M.D.   On: 10/24/2016 08:25   Ct Forearm Right W Contrast  Result Date: 10/24/2016 CLINICAL DATA:  Right arm swelling and erythema with contracture of the right hand in the setting of a right clavicular fracture and right radial nerve palsy. Concern for infection or compartment syndrome. EXAM: CT OF THE UPPER RIGHT EXTREMITY WITH CONTRAST TECHNIQUE: Multidetector CT imaging of the right humerus and forearm was performed according to the standard protocol following intravenous contrast administration. COMPARISON:  Right humerus x-rays dated August 23, 2016. CONTRAST:  ISOVUE-300 IOPAMIDOL (ISOVUE-300) INJECTION 61% FINDINGS: Bones/Joint/Cartilage There is a subacute, comminuted fracture of the midclavicle with approximately 1 shaft bone width posterior displacement of the distal fragment. There is surrounding callus formation. No additional fractures.  No glenohumeral or elbow joint effusion. Ligaments Suboptimally assessed by CT. Muscles and Tendons Calcification adjacent to the posterior greater tuberosity in the region of the distal infraspinatus tendon. Soft tissues Diffuse anasarca in the visualized soft tissues. No drainable fluid collection. There is mild mass effect on the right subclavian vein as it passes behind the healing clavicle fracture. The vein appears patent. No axillary lymphadenopathy.  The visualized right lung is clear. IMPRESSION: 1. Diffuse anasarca in the visualized soft tissues, including the right arm and right chest and abdominal wall. No  discrete drainable fluid collection is seen. Please note that the diagnosis of compartment syndrome cannot be established by imaging. 2. Subacute, healing comminuted fracture of the mid clavicle with approximately 1 shaft bone width posterior displacement of the distal fragment. 3. Mild mass effect on the right subclavian vein as it passes behind the healing clavicle fracture. The vein appears patent. If there is clinical concern for DVT, recommend further evaluation with ultrasound. 4. Probable calcific tendinitis of the infraspinatus tendon. Electronically Signed   By: Obie Dredge M.D.   On: 10/24/2016 08:25   US Venous Img Lower Bilateral  Result Date: 10/15/2016 CLINICAL DATA:  Leg swelling. EXAM: BILATERAL LOWER EXTREMITY VENOUS DOPPLER ULTRASOUND TECHNIQUE: Gray-scale sonography with graded compression, as well as color Doppler and duplex ultrasound were performed to evaluate the lower extremity deep venous systems from the level of the common femoral vein and including the common femoral, femoral, profunda femoral, popliteal and calf veins including the posterior tibial, peroneal and gastrocnemius veins when visible. The superficial great saphenous vein was also interrogated. Spectral Doppler was utilized to evaluate flow at rest and with distal augmentation maneuvers in the common femoral, femoral and popliteal veins. COMPARISON:  Knee MRI 06/01/2016 FINDINGS: RIGHT LOWER EXTREMITY Common Femoral Vein: No evidence of thrombus. Normal compressibility, respiratory phasicity and response to augmentation. Saphenofemoral Junction: No evidence of thrombus. Normal compressibility and flow on color Doppler imaging. Profunda Femoral Vein: No evidence of thrombus. Normal compressibility and flow on color Doppler imaging. Femoral Vein: No evidence of thrombus. Normal compressibility, respiratory phasicity and response to augmentation. Popliteal Vein: No evidence of thrombus. Normal compressibility, respiratory  phasicity and response to augmentation. Calf Veins: No evidence of thrombus. Normal compressibility and flow on color Doppler imaging. Superficial Great Saphenous Vein: No evidence of thrombus. Normal compressibility and flow on color Doppler imaging. Other Findings:  Subcutaneous edema. LEFT LOWER EXTREMITY Common Femoral Vein: No evidence of thrombus. Normal compressibility, respiratory phasicity and response to augmentation. Saphenofemoral Junction: No evidence of thrombus. Normal compressibility and flow on color Doppler imaging. Profunda Femoral Vein: No evidence  of thrombus. Normal compressibility and flow on color Doppler imaging. Femoral Vein: No evidence of thrombus. Normal compressibility, respiratory phasicity and response to augmentation. Popliteal Vein: No evidence of thrombus. Normal compressibility, respiratory phasicity and response to augmentation. Calf Veins: No evidence of thrombus. Normal compressibility and flow on color Doppler imaging. Superficial Great Saphenous Vein: No evidence of thrombus. Normal compressibility and flow on color Doppler imaging. Other Findings:  Subcutaneous edema. IMPRESSION: No evidence of DVT within either lower extremity. Bilateral subcutaneous edema. Electronically Signed   By: Genevive Bi M.D.   On: 10/15/2016 10:55   US Abdomen Limited Ruq  Result Date: 10/24/2016 CLINICAL DATA:  Right upper quadrant pain. EXAM: ULTRASOUND ABDOMEN LIMITED RIGHT UPPER QUADRANT COMPARISON:  MRI 06/01/2016.  CT 06/18/2014 . FINDINGS: Gallbladder: Cholecystectomy. Common bile duct: Diameter: 6.7 mm Liver: Increased echogenicity of the liver noted consistent fatty infiltration and/or hepatocellular disease. A 1.1 cm simple cyst in the left hepatic lobe. Questionable very mild ascites. Tiny right pleural effusion. IMPRESSION: 1. Cholecystectomy.  No biliary distention. 2. Increased hepatic echogenicity consistent fatty infiltration and/or hepatocellular disease. Questions mild  ascites. Small right pleural effusion. Electronically Signed   By: Maisie Fus  Register   On: 10/24/2016 10:05   CBC  Recent Labs Lab 10/26/16 0911 10/29/16 1831 10/30/16 0224  WBC 4.3 7.5 5.9  HGB 9.1* 10.4* 9.8*  HCT 27.7* 31.0* 29.0*  PLT 101* 162 156  MCV 104.5* 104.4* 104.6*  MCH 34.3* 35.2* 35.4*  MCHC 32.9 33.7 33.8  RDW 15.1 15.0* 15.0*  LYMPHSABS 2.2  --   --   MONOABS 0.5  --   --   EOSABS 0.2  --   --   BASOSABS 0.0  --   --     Chemistries   Recent Labs Lab 10/26/16 0911 10/29/16 1831 10/30/16 0224 11/01/16 1222  NA 143 141 143 142  K 3.4* 3.7 3.7 4.0  CL 113* 111 118* 113*  CO2 23 25 21* 26  GLUCOSE 85 78 74 84  BUN 10 17 15 16   CREATININE 0.48 0.61 0.52 0.55  CALCIUM 7.7* 8.1* 7.8* 7.6*  MG 1.7  --   --   --   AST 23 35 33 34  ALT 46 47 42 38  ALKPHOS 138* 150* 139* 138*  BILITOT 0.8 0.9 0.6 0.6   ------------------------------------------------------------------------------------------------------------------ estimated creatinine clearance is 92.7 mL/min (by C-G formula based on SCr of 0.55 mg/dL). ------------------------------------------------------------------------------------------------------------------ No results for input(s): HGBA1C in the last 72 hours. ------------------------------------------------------------------------------------------------------------------ No results for input(s): CHOL, HDL, LDLCALC, TRIG, CHOLHDL, LDLDIRECT in the last 72 hours. ------------------------------------------------------------------------------------------------------------------ No results for input(s): TSH, T4TOTAL, T3FREE, THYROIDAB in the last 72 hours.  Invalid input(s): FREET3 ------------------------------------------------------------------------------------------------------------------ No results for input(s): VITAMINB12, FOLATE, FERRITIN, TIBC, IRON, RETICCTPCT in the last 72 hours.  Coagulation profile  Recent Labs Lab 10/29/16 1831  10/30/16 0224  INR 1.04 1.05    No results for input(s): DDIMER in the last 72 hours.  Cardiac Enzymes No results for input(s): CKMB, TROPONINI, MYOGLOBIN in the last 168 hours.  Invalid input(s): CK ------------------------------------------------------------------------------------------------------------------ Invalid input(s): POCBNP  Assessment & Plan   This is a 41 y.o. female with a history of anxiety, hypertension, neuromuscular disorder, hypothyroidism,GERD, IBS, chronic lower extremity edema with stasis dermatitis osteoporosis and AVN of the right hip now being admitted with:  # Acute hepatic encephalopathy. Stat ammonia level came back at 169. We will start lactulose 3 times a day. Follow clinically.  # Cirrhosis? Patient has hepatocellular disease on  ultrasound of the abdomen. Discussed with her family and she does abuse alcohol and pain medications. Will get acute hepatitis panel. Alcohol and Tylenol levels were negative on admission. Will need GI follow-up.  # Anasarca due to cirrhosis and hypoalbuminemia. Will increase dose of Lasix to 40 twice a day.  #. Weakness, gait instability -  Seen by [facility] nursing facility recommended, needs OT - Weighted insurance authorization  #. History of AVN of the hip - Pain control  #. Anemia, chronic and stable - Monitor CBC  #. Severe protein calorie malnutrition - Add ensure today 3 times a day between meals.  #. History of hypothyroidism - Continue Synthroid  #. History of GERD - Continue Protonix     Code Status Orders        Start     Ordered   10/30/16 0200  Full code  Continuous     10/30/16 0159    Code Status History    Date Active Date Inactive Code Status Order ID Comments User Context   10/22/2016  6:25 PM 10/26/2016  4:00 PM Full Code [id number]  Pieter Partridge, MD Inpatient   10/22/2016 12:27 PM 10/22/2016  6:25 PM Full Code [id number]  Melene Plan, DO ED   10/14/2016  9:19 PM  10/16/2016  3:42 PM Full Code [id number]  Katharina Caper, MD Inpatient   09/27/2016  6:11 PM 09/29/2016  9:00 PM Full Code [id number]  Altamese Dilling, MD Inpatient   06/01/2016  6:34 PM 06/03/2016 10:42 PM Full Code [id number]  Russella Dar, NP Inpatient   02/11/2016  2:22 AM 02/11/2016 10:15 PM Full Code [id number]  Eduard Clos, MD Inpatient     Consults  none  DVT Prophylaxis  Lovenox   Lab Results  Component Value Date   PLT 156 10/30/2016    Time CRITICAL CARE Spent in minutes  35 min Greater than 50% of time spent in care coordination and counseling patient regarding the condition and plan of care.   Milagros Loll R M.D on 11/01/2016 at 2:28 PM  Between 7am to 6pm - Pager - [phone]  After 6pm go to [email or web address] - password [email or web address] [facility]  [facility]   Office  [phone]

## 2016-11-01 NOTE — Progress Notes (Signed)
Pnt has slept this shift but easily awakened. No new issues or concerns at this time. Will continue to monitor and assess.

## 2016-11-01 NOTE — Evaluation (Signed)
Occupational Therapy Evaluation Patient Details Name: Dorothy Dyer MRN: [id number] DOB: Dec 19, 1975 Today's Date: 11/01/2016    History of Present Illness Dorothy Dyer is a 41 y.o. female with a known history of anxiety, hypertension, neuromuscular disorder, hypothyroidism, GERD, IBS, chronic lower extremity edema with stasis dermatitis osteoporosis and AVN of the right hip presents to the emergency department for evaluation of weakness and fall.  Patient was in a usual state of health until she slowly fell to the floor while using her walker and landed on her left side. She described generalized weakness that prohibited her from getting back to standing position. She was on the floor for about five hours. Denies head trauma or loss of consciousness. Patient lives with her son who has autism. Of note patient was hospitalized on 10/22/2016 for Tylenol overdose after taking an unknown quantity of Percocet as well as drinking alcohol and a possible suicide attempt.  At that time she was found to have acute hepatic and toxic encephalopathy, anasarca related to liver disease and severe protein malnutrition. she was discharged on 10/26/2016. She has had multiple hospital admissions for falls, fractures and opiate overdoses.   Clinical Impression   Patient seen for OT evaluation this date, appears confused at times with reports of recent events.  Patient demonstrates decreased muscle strength, decreased transfers, decreased mobility and decreased ability to perform self care tasks.  She required assistance this date to sit edge of bed for self care tasks.  She lives alone and cannot perform her basic needs at this time.  She would benefit from skilled OT services to maximize her safety and independence in daily tasks and will likely require short term rehab to improve skills to return to her prior level of function.  She has had repeated falls (as many as 8 in the last 6 months) as well as repeated  hospital admissions recently.      Follow Up Recommendations  SNF    Equipment Recommendations       Recommendations for Other Services       Precautions / Restrictions Precautions Precautions: Fall Restrictions Weight Bearing Restrictions: No Other Position/Activity Restrictions: Pt was previously NWB RUE s/p clavicle fracture however has not respected precautions due to need for heavy UE support on walker to ambulate      Mobility Bed Mobility Overal bed mobility: Needs Assistance Bed Mobility: Supine to Sit     Supine to sit: Mod assist Sit to supine: Mod assist   General bed mobility comments: moderate assist for supine to sit, max assist for sit to supine and assist of 2 to reposition in bed this date.   Transfers Overall transfer level: Needs assistance Equipment used: Rolling walker (2 wheeled) Transfers: Sit to/from Stand Sit to Stand: Min assist              Balance                                           ADL either performed or assessed with clinical judgement   ADL Overall ADL's : Needs assistance/impaired Eating/Feeding: Set up   Grooming: Set up;Min guard;Sitting   Upper Body Bathing: Moderate assistance   Lower Body Bathing: Maximal assistance   Upper Body Dressing : Minimal assistance   Lower Body Dressing: Maximal assistance   Toilet Transfer: Moderate assistance   Toileting- Clothing Manipulation and Hygiene: Maximal assistance  General ADL Comments: Patient with increased weakness in both UE and LE limiting her ability to complete self care tasks.  Nurse in to give medications, therapist provided min to min guard assist for sitting balance at the EOB and required moderate cues for sitting up and keeping head up during medication.       Vision   Additional Comments: Patient reports she is supposed to wear glasses but doesn't     Perception     Praxis      Pertinent Vitals/Pain Pain Assessment:  0-10 Pain Score: 4  Pain Location: L hip  Pain Descriptors / Indicators: Grimacing Pain Intervention(s): Limited activity within patient's tolerance;Monitored during session;RN gave pain meds during session     Hand Dominance Right   Extremity/Trunk Assessment Upper Extremity Assessment Upper Extremity Assessment: Generalized weakness RUE Deficits / Details: Poor bilateral shoulder flexion and elbow flexion/extension strength. Bilateral UE swelling noted.  Patient sits with poor posture and head down during session.   Lower Extremity Assessment Lower Extremity Assessment: Defer to PT evaluation LLE Deficits / Details: Patient with LE weakness with transfers and bed mobility.        Communication Communication Communication: No difficulties   Cognition Arousal/Alertness: Awake/alert Behavior During Therapy: Flat affect Overall Cognitive Status: Impaired/Different from baseline                                 General Comments: Patient reports she is not sure what got her here.  States she was driving and felt tired and pulled over and a policeman stopped and brought her to the hospital.  Nurse came into room during evaluation and reported patients mom called and was worried because the patient appeared confused.  Patient was able to follow commands during session but recall of recent events is questionable.    General Comments       Exercises     Shoulder Instructions      Home Living Family/patient expects to be discharged to:: Skilled nursing facility Living Arrangements: Alone Available Help at Discharge: Family Type of Home: Mobile home Home Access: Stairs to enter Entrance Stairs-Number of Steps: 4 Entrance Stairs-Rails: Left Home Layout: One level     Bathroom Shower/Tub: Producer, television/film/video: Handicapped height     Home Equipment: Environmental consultant - 2 wheels;Bedside commode;Shower seat;Toilet riser;Other (comment)   Additional Comments: Pt  has an autistic son who is currently living in a home for autistic children.        Prior Functioning/Environment Level of Independence: Independent        Comments: Per pt son is now living "in an autism home." Brother and sister in law provide some assist. Father and mother have returned to [place] since last admission and are not available to help. States that previoulsy she was independent with ADLs/IADLs but has been declining severely over the last few months with frequent admissions to the hospital. Ambulates with rolling walker limited distances prior to admission. Reports at least 8 falls in the last 6 months        OT Problem List: Decreased strength;Decreased range of motion;Decreased activity tolerance;Decreased knowledge of use of DME or AE;Pain;Impaired balance (sitting and/or standing);Decreased cognition      OT Treatment/Interventions: Self-care/ADL training;DME and/or AE instruction;Patient/family education;Therapeutic activities    OT Goals(Current goals can be found in the care plan section) Acute Rehab OT Goals Patient Stated Goal: Wants to be able  to take care of herself OT Goal Formulation: With patient Time For Goal Achievement: 12/09/16 Potential to Achieve Goals: Good  OT Frequency: Min 2X/week   Barriers to D/C:    Patient is unable to care for herself at this time and cannot live alone.        Co-evaluation              AM-PAC PT "6 Clicks" Daily Activity     Outcome Measure Help from another person eating meals?: A Little Help from another person taking care of personal grooming?: A Little Help from another person toileting, which includes using toliet, bedpan, or urinal?: A Lot Help from another person bathing (including washing, rinsing, drying)?: A Lot Help from another person to put on and taking off regular upper body clothing?: A Little Help from another person to put on and taking off regular lower body clothing?: A Lot 6 Click  Score: 15   End of Session    Activity Tolerance: Patient limited by fatigue;Patient limited by pain Patient left: in bed;with call bell/phone within reach;with bed alarm set  OT Visit Diagnosis: Muscle weakness (generalized) (M62.81);Repeated falls (R29.6);Pain                Time: 5621-3086 OT Time Calculation (min): 38 min Charges:  OT General Charges $OT Visit: 1 Procedure OT Evaluation $OT Eval Moderate Complexity: 1 Procedure OT Treatments $Self Care/Home Management : 23-37 mins G-Codes:     Dorothy Dyer, OTR/L, CLT   Dorothy Dyer 11/01/2016, 12:31 PM

## 2016-11-01 NOTE — Progress Notes (Signed)
Physical Therapy Treatment Patient Details Name: Dorothy Dyer MRN: [id number] DOB: May 13, 1975 Today's Date: 11/01/2016    History of Present Illness Dorothy Dyer is a 41 y.o. female with a known history of anxiety, hypertension, neuromuscular disorder, hypothyroidism, GERD, IBS, chronic lower extremity edema with stasis dermatitis osteoporosis and AVN of the right hip presents to the emergency department for evaluation of weakness and fall.  Patient was in a usual state of health until she slowly fell to the floor while using her walker and landed on her left side. She described generalized weakness that prohibited her from getting back to standing position. She was on the floor for about five hours. Denies head trauma or loss of consciousness. Patient lives with her son who has autism. Of note patient was hospitalized on 10/22/2016 for Tylenol overdose after taking an unknown quantity of Percocet as well as drinking alcohol and a possible suicide attempt.  At that time she was found to have acute hepatic and toxic encephalopathy, anasarca related to liver disease and severe protein malnutrition. she was discharged on 10/26/2016. She has had multiple hospital admissions for falls, fractures and opiate overdoses.    PT Comments    Pt is very lethargic and somnolent during session today. She is also more confused than her baseline and exercises take an extended time to complete. She is able to complete exercises with heavy verbal and tactile cues. Deferred bed mobility, transfers, and ambulation due to somnolence and confusion. Will continue to progress as mental status improves. Pt will benefit from PT services to address deficits in strength, balance, and mobility in order to return to full function at home.     Follow Up Recommendations  SNF     Equipment Recommendations  None recommended by PT    Recommendations for Other Services OT consult     Precautions / Restrictions  Precautions Precautions: Fall Restrictions Weight Bearing Restrictions: No Other Position/Activity Restrictions: Pt was previously NWB RUE s/p clavicle fracture however has not respected precautions due to need for heavy UE support on walker to ambulate    Mobility  Bed Mobility               General bed mobility comments: Deferred all bed mobilty, transfers, or ambulation at this time due to lethargy, somnolence, and confusion.   Transfers                    Ambulation/Gait                 Stairs            Wheelchair Mobility    Modified Rankin (Stroke Patients Only)       Balance                                            Cognition Arousal/Alertness: Lethargic Behavior During Therapy: Flat affect Overall Cognitive Status: Impaired/Different from baseline Area of Impairment: Orientation                 Orientation Level: Disoriented to;Time;Situation             General Comments: Pt very lethargic throughout session. She barley keeps her eyes open. Requires extended time to complete exercises due to lethargy and confusion      Exercises General Exercises - Upper Extremity Elbow Flexion: Strengthening;Both;15 reps;Supine Elbow Extension: Strengthening;Both;15 reps;Supine  General Exercises - Lower Extremity Ankle Circles/Pumps: AROM;Both;Supine;15 reps Quad Sets: Strengthening;Both;Supine;15 reps Gluteal Sets: Strengthening;Both;15 reps;Supine Short Arc Quad: Strengthening;Both;15 reps;Supine Heel Slides: Strengthening;Both;15 reps;Supine Hip ABduction/ADduction: Strengthening;Both;15 reps;Supine Straight Leg Raises: Strengthening;Both;15 reps;Supine    General Comments        Pertinent Vitals/Pain Pain Assessment: Faces Faces Pain Scale: Hurts a little bit Pain Location: She reports back pain but is unable to rate. No apparent pain during bed execises Pain Descriptors / Indicators: Grimacing Pain  Intervention(s): Monitored during session    Home Living                      Prior Function            PT Goals (current goals can now be found in the care plan section) Acute Rehab PT Goals Patient Stated Goal: Wants to be able to take care of herself PT Goal Formulation: With patient Time For Goal Achievement: 11/13/16 Potential to Achieve Goals: Fair Progress towards PT goals: Not progressing toward goals - comment (Pt with increased confusion today)    Frequency    Min 2X/week      PT Plan Current plan remains appropriate    Co-evaluation              AM-PAC PT "6 Clicks" Daily Activity  Outcome Measure  Difficulty turning over in bed (including adjusting bedclothes, sheets and blankets)?: Unable Difficulty moving from lying on back to sitting on the side of the bed? : Unable Difficulty sitting down on and standing up from a chair with arms (e.g., wheelchair, bedside commode, etc,.)?: Unable Help needed moving to and from a bed to chair (including a wheelchair)?: A Lot Help needed walking in hospital room?: A Lot Help needed climbing 3-5 steps with a railing? : Total 6 Click Score: 8    End of Session Equipment Utilized During Treatment: Gait belt Activity Tolerance: Patient tolerated treatment well Patient left: in bed;with bed alarm set;with call bell/phone within reach Nurse Communication: Other (comment) (CNA notified pt needs to use bathroom) PT Visit Diagnosis: Pain;Difficulty in walking, not elsewhere classified (R26.2);Muscle weakness (generalized) (M62.81);Repeated falls (R29.6) Pain - Right/Left: Left Pain - part of body: Hip     Time: 1646-1710 PT Time Calculation (min) (ACUTE ONLY): 24 min  Charges:  $Gait Training: 8-22 mins $Therapeutic Exercise: 8-22 mins                    G Codes:       Sharalyn Ink Mellonie Guess PT, DPT     Braxton Vantrease 11/01/2016, 5:23 PM

## 2016-11-02 LAB — HEPATITIS PANEL, ACUTE
HEP A IGM: NEGATIVE
HEP B C IGM: NEGATIVE
Hepatitis B Surface Ag: NEGATIVE

## 2016-11-02 NOTE — Progress Notes (Signed)
Physical Therapy Treatment Patient Details Name: Dorothy Dyer MRN: [id number] DOB: 03/12/1976 Today's Date: 11/02/2016    History of Present Illness Semaja Canny is a 41 y.o. female with a known history of anxiety, hypertension, neuromuscular disorder, hypothyroidism, GERD, IBS, chronic lower extremity edema with stasis dermatitis osteoporosis and AVN of the right hip presents to the emergency department for evaluation of weakness and fall.  Patient was in a usual state of health until she slowly fell to the floor while using her walker and landed on her left side. She described generalized weakness that prohibited her from getting back to standing position. She was on the floor for about five hours. Denies head trauma or loss of consciousness. Patient lives with her son who has autism. Of note patient was hospitalized on 10/22/2016 for Tylenol overdose after taking an unknown quantity of Percocet as well as drinking alcohol and a possible suicide attempt.  At that time she was found to have acute hepatic and toxic encephalopathy, anasarca related to liver disease and severe protein malnutrition. she was discharged on 10/26/2016. She has had multiple hospital admissions for falls, fractures and opiate overdoses.    PT Comments    Pt more alert this session however continues to demonstrate lethargy and some confusion. Pt able to participate in bed mobility with overvall modA and performed x 1 sit to/from stand at EOB however with anterior lean noted and corrected with multimodal cues. Pt participate in supine therex with increased lethargy noted as compared to beginning of session. Pt would continue to benefit from skilled PT services to increase overall strength and increase functional mobility tolerance.    Follow Up Recommendations  SNF     Equipment Recommendations  None recommended by PT    Recommendations for Other Services       Precautions / Restrictions  Precautions Precautions: Fall Restrictions Weight Bearing Restrictions: No Other Position/Activity Restrictions: Pt was previously NWB RUE s/p clavicle fracture however has not respected precautions due to need for heavy UE support on walker to ambulate    Mobility  Bed Mobility Overal bed mobility: Needs Assistance Bed Mobility: Supine to Sit;Sit to Supine     Supine to sit: Mod assist Sit to supine: Mod assist   General bed mobility comments: modA for truncal support and LE placement  Transfers Overall transfer level: Needs assistance Equipment used: Rolling walker (2 wheeled) Transfers: Sit to/from Stand Sit to Stand: Min assist         General transfer comment: increased anterior wt shift upon standing requring cues for returning to neutral  Ambulation/Gait                 Stairs            Wheelchair Mobility    Modified Rankin (Stroke Patients Only)       Balance Overall balance assessment: Needs assistance Sitting-balance support: Single extremity supported;Feet supported Sitting balance-Leahy Scale: Fair     Standing balance support: Bilateral upper extremity supported Standing balance-Leahy Scale: Fair Standing balance comment: anterior lean upon standing, corrected with multimodal cues                            Cognition Arousal/Alertness: Lethargic Behavior During Therapy: Flat affect Overall Cognitive Status: Impaired/Different from baseline Area of Impairment: Attention  General Comments: Pt lethargic but able to follow commands, orientated to place      Exercises General Exercises - Lower Extremity Ankle Circles/Pumps: AROM;Strengthening;Both;10 reps;Supine Quad Sets: Strengthening;Both;10 reps Short Arc Quad: Strengthening;Both;10 reps;Supine Long Arc Quad: Strengthening;Both;10 reps;Seated Heel Slides: Strengthening;Both;10 reps;Supine Hip ABduction/ADduction:  Strengthening;Both;10 reps;Supine Straight Leg Raises: Strengthening;Both;10 reps;Supine    General Comments        Pertinent Vitals/Pain Pain Assessment: Faces Faces Pain Scale: Hurts little more Pain Location: L hip during supine therex Pain Descriptors / Indicators: Grimacing;Moaning Pain Intervention(s): Limited activity within patient's tolerance;Monitored during session    Home Living                      Prior Function            PT Goals (current goals can now be found in the care plan section) Acute Rehab PT Goals Patient Stated Goal: Wants to be able to take care of herself PT Goal Formulation: With patient Time For Goal Achievement: 11/13/16 Potential to Achieve Goals: Fair Progress towards PT goals: Progressing toward goals    Frequency    Min 2X/week      PT Plan Current plan remains appropriate    Co-evaluation              AM-PAC PT "6 Clicks" Daily Activity  Outcome Measure  Difficulty turning over in bed (including adjusting bedclothes, sheets and blankets)?: Unable Difficulty moving from lying on back to sitting on the side of the bed? : Unable Difficulty sitting down on and standing up from a chair with arms (e.g., wheelchair, bedside commode, etc,.)?: A Lot Help needed moving to and from a bed to chair (including a wheelchair)?: A Lot Help needed walking in hospital room?: A Lot Help needed climbing 3-5 steps with a railing? : Total 6 Click Score: 9    End of Session Equipment Utilized During Treatment: Gait belt Activity Tolerance: Patient tolerated treatment well;Patient limited by lethargy Patient left: in bed;with call bell/phone within reach;with bed alarm set   PT Visit Diagnosis: Pain;Difficulty in walking, not elsewhere classified (R26.2);Muscle weakness (generalized) (M62.81);Repeated falls (R29.6) Pain - Right/Left: Left Pain - part of body: Hip     Time: 7282-0601 PT Time Calculation (min) (ACUTE ONLY): 29  min  Charges:  $Therapeutic Exercise: 8-22 mins $Therapeutic Activity: 8-22 mins                    G Codes:         Oddie Bottger 11/02/2016, 12:34 PM

## 2016-11-02 NOTE — Progress Notes (Signed)
[employer] - [place] at [facility]                                                                                                                                                                                 Patient Demographics   Dorothy Dyer, is a 41 y.o. female, DOB - 1975-06-19, ZOX:[id number]  Admit date - 10/29/2016   Admitting Physician Tonye Royalty, DO  Outpatient Primary MD for the patient is Patient, No Pcp Per   LOS - 1  Subjective:  Feels weak. Confused at times. Has chronic abd pain No h/o liver disease other than recently diagnosed fatty liver.  Review of Systems:   Cannot obtain due to confusion  Vitals:   Vitals:   11/01/16 1801 11/01/16 2047 11/02/16 0408 11/02/16 1020  BP: (!) 132/92 124/83 107/60 113/77  Pulse: (!) 103 92 85 89  Resp:  (!) 21 20 16   Temp:  (!) 97.4 F (36.3 C) 97.9 F (36.6 C) 97.6 F (36.4 C)  TempSrc:  Oral Oral Oral  SpO2:  100% 100% 100%  Weight:    87.5 kg (193 lb)  Height:    5' 1.5" (1.562 m)    Wt Readings from Last 3 Encounters:  11/02/16 87.5 kg (193 lb)  10/26/16 94.2 kg (207 lb 10.8 oz)  10/15/16 82.1 kg (181 lb)    No intake or output data in the 24 hours ending 11/02/16 1055  Physical Exam:   GENERAL:no apparent distress.  HEAD, EYES, EARS, NOSE AND THROAT: Atraumatic, normocephalic. Extraocular muscles are intact. Pupils equal and reactive to light. Sclerae anicteric. No conjunctival injection. No oro-pharyngeal erythema.  NECK: Supple. There is no jugular venous distention. No bruits, no lymphadenopathy, no thyromegaly.  HEART: Regular rate and rhythm,. No murmurs, no rubs, no clicks.  LUNGS: Clear to auscultation bilaterally. No rales or rhonchi. No wheezes.  ABDOMEN: Soft, flat, nontender, nondistended. Has good bowel sounds. No hepatosplenomegaly appreciated.  EXTREMITIES: Bilateral lower extremity edema with Unna boots in place NEUROLOGIC: The patient is alert, awake, and oriented  x3 with no focal motor or sensory deficits appreciated bilaterally.  SKIN: Moist and warm with no rashes appreciated.  Psych: Awake and alert LN: No inguinal LN enlargement Has anasarca. Asterixis    Antibiotics   Anti-infectives    Start     Dose/Rate Route Frequency Ordered Stop   11/01/16 1530  rifaximin (XIFAXAN) tablet 550 mg     550 mg Oral 2 times daily 11/01/16 1419     10/30/16 0159  doxycycline (VIBRA-TABS) tablet 100 mg  Status:  Discontinued     100 mg Oral Every 12 hours 10/30/16 0159 10/30/16 1328  Medications   Scheduled Meds: . enoxaparin (LOVENOX) injection  40 mg Subcutaneous Q24H  . furosemide  40 mg Oral BID  . lactulose  20 g Oral TID  . levothyroxine  125 mcg Oral QODAY  . levothyroxine  137 mcg Oral QODAY  . multivitamin with minerals  1 tablet Oral BID  . naproxen  500 mg Oral BID WC  . nortriptyline  75 mg Oral QHS  . omega-3 acid ethyl esters  1 g Oral Daily  . pantoprazole  40 mg Oral Daily  . potassium chloride SA  20 mEq Oral Daily  . pregabalin  225 mg Oral BID  . protein supplement shake  11 oz Oral TID BM  . rifaximin  550 mg Oral BID  . sodium chloride flush  3 mL Intravenous Q12H   Continuous Infusions: . sodium chloride     PRN Meds:.sodium chloride, albuterol, bisacodyl, ipratropium, magnesium citrate, ondansetron **OR** ondansetron (ZOFRAN) IV, oxyCODONE-acetaminophen, senna-docusate, simethicone, sodium chloride flush, traMADol   Data Review:   Micro Results No results found for this or any previous visit (from the past 240 hour(s)).  Radiology Reports Dg Chest 1 View  Result Date: 10/14/2016 CLINICAL DATA:  Recent discharge from hospital, now unable to walk or stand independently. pt is having weakness and bilat lower extremity pain and swelling. Pt also has skin sloughing with bilat ulcerated areas to her lower legs and feet. EXAM: CHEST 1 VIEW COMPARISON:  Chest x-ray dated 09/27/2016. FINDINGS: Study is hypoinspiratory  with crowding of the perihilar bronchovascular markings. Given the low lung volumes, lungs appear clear. No pleural effusion or pneumothorax seen. Heart size and mediastinal contours are normal. No acute or suspicious osseous finding. IMPRESSION: Low lung volumes. No active disease. No evidence of pneumonia or pulmonary edema. Electronically Signed   By: Bary Richard M.D.   On: 10/14/2016 17:32   Ct Humerus Right W Contrast  Result Date: 10/24/2016 CLINICAL DATA:  Right arm swelling and erythema with contracture of the right hand in the setting of a right clavicular fracture and right radial nerve palsy. Concern for infection or compartment syndrome. EXAM: CT OF THE UPPER RIGHT EXTREMITY WITH CONTRAST TECHNIQUE: Multidetector CT imaging of the right humerus and forearm was performed according to the standard protocol following intravenous contrast administration. COMPARISON:  Right humerus x-rays dated August 23, 2016. CONTRAST:  ISOVUE-300 IOPAMIDOL (ISOVUE-300) INJECTION 61% FINDINGS: Bones/Joint/Cartilage There is a subacute, comminuted fracture of the midclavicle with approximately 1 shaft bone width posterior displacement of the distal fragment. There is surrounding callus formation. No additional fractures.  No glenohumeral or elbow joint effusion. Ligaments Suboptimally assessed by CT. Muscles and Tendons Calcification adjacent to the posterior greater tuberosity in the region of the distal infraspinatus tendon. Soft tissues Diffuse anasarca in the visualized soft tissues. No drainable fluid collection. There is mild mass effect on the right subclavian vein as it passes behind the healing clavicle fracture. The vein appears patent. No axillary lymphadenopathy.  The visualized right lung is clear. IMPRESSION: 1. Diffuse anasarca in the visualized soft tissues, including the right arm and right chest and abdominal wall. No discrete drainable fluid collection is seen. Please note that the diagnosis of  compartment syndrome cannot be established by imaging. 2. Subacute, healing comminuted fracture of the mid clavicle with approximately 1 shaft bone width posterior displacement of the distal fragment. 3. Mild mass effect on the right subclavian vein as it passes behind the healing clavicle fracture. The vein appears patent. If  there is clinical concern for DVT, recommend further evaluation with ultrasound. 4. Probable calcific tendinitis of the infraspinatus tendon. Electronically Signed   By: Obie Dredge M.D.   On: 10/24/2016 08:25   Ct Forearm Right W Contrast  Result Date: 10/24/2016 CLINICAL DATA:  Right arm swelling and erythema with contracture of the right hand in the setting of a right clavicular fracture and right radial nerve palsy. Concern for infection or compartment syndrome. EXAM: CT OF THE UPPER RIGHT EXTREMITY WITH CONTRAST TECHNIQUE: Multidetector CT imaging of the right humerus and forearm was performed according to the standard protocol following intravenous contrast administration. COMPARISON:  Right humerus x-rays dated August 23, 2016. CONTRAST:  ISOVUE-300 IOPAMIDOL (ISOVUE-300) INJECTION 61% FINDINGS: Bones/Joint/Cartilage There is a subacute, comminuted fracture of the midclavicle with approximately 1 shaft bone width posterior displacement of the distal fragment. There is surrounding callus formation. No additional fractures.  No glenohumeral or elbow joint effusion. Ligaments Suboptimally assessed by CT. Muscles and Tendons Calcification adjacent to the posterior greater tuberosity in the region of the distal infraspinatus tendon. Soft tissues Diffuse anasarca in the visualized soft tissues. No drainable fluid collection. There is mild mass effect on the right subclavian vein as it passes behind the healing clavicle fracture. The vein appears patent. No axillary lymphadenopathy.  The visualized right lung is clear. IMPRESSION: 1. Diffuse anasarca in the visualized soft tissues,  including the right arm and right chest and abdominal wall. No discrete drainable fluid collection is seen. Please note that the diagnosis of compartment syndrome cannot be established by imaging. 2. Subacute, healing comminuted fracture of the mid clavicle with approximately 1 shaft bone width posterior displacement of the distal fragment. 3. Mild mass effect on the right subclavian vein as it passes behind the healing clavicle fracture. The vein appears patent. If there is clinical concern for DVT, recommend further evaluation with ultrasound. 4. Probable calcific tendinitis of the infraspinatus tendon. Electronically Signed   By: Obie Dredge M.D.   On: 10/24/2016 08:25   US Venous Img Lower Bilateral  Result Date: 10/15/2016 CLINICAL DATA:  Leg swelling. EXAM: BILATERAL LOWER EXTREMITY VENOUS DOPPLER ULTRASOUND TECHNIQUE: Gray-scale sonography with graded compression, as well as color Doppler and duplex ultrasound were performed to evaluate the lower extremity deep venous systems from the level of the common femoral vein and including the common femoral, femoral, profunda femoral, popliteal and calf veins including the posterior tibial, peroneal and gastrocnemius veins when visible. The superficial great saphenous vein was also interrogated. Spectral Doppler was utilized to evaluate flow at rest and with distal augmentation maneuvers in the common femoral, femoral and popliteal veins. COMPARISON:  Knee MRI 06/01/2016 FINDINGS: RIGHT LOWER EXTREMITY Common Femoral Vein: No evidence of thrombus. Normal compressibility, respiratory phasicity and response to augmentation. Saphenofemoral Junction: No evidence of thrombus. Normal compressibility and flow on color Doppler imaging. Profunda Femoral Vein: No evidence of thrombus. Normal compressibility and flow on color Doppler imaging. Femoral Vein: No evidence of thrombus. Normal compressibility, respiratory phasicity and response to augmentation. Popliteal Vein:  No evidence of thrombus. Normal compressibility, respiratory phasicity and response to augmentation. Calf Veins: No evidence of thrombus. Normal compressibility and flow on color Doppler imaging. Superficial Great Saphenous Vein: No evidence of thrombus. Normal compressibility and flow on color Doppler imaging. Other Findings:  Subcutaneous edema. LEFT LOWER EXTREMITY Common Femoral Vein: No evidence of thrombus. Normal compressibility, respiratory phasicity and response to augmentation. Saphenofemoral Junction: No evidence of thrombus. Normal compressibility and flow on color  Doppler imaging. Profunda Femoral Vein: No evidence of thrombus. Normal compressibility and flow on color Doppler imaging. Femoral Vein: No evidence of thrombus. Normal compressibility, respiratory phasicity and response to augmentation. Popliteal Vein: No evidence of thrombus. Normal compressibility, respiratory phasicity and response to augmentation. Calf Veins: No evidence of thrombus. Normal compressibility and flow on color Doppler imaging. Superficial Great Saphenous Vein: No evidence of thrombus. Normal compressibility and flow on color Doppler imaging. Other Findings:  Subcutaneous edema. IMPRESSION: No evidence of DVT within either lower extremity. Bilateral subcutaneous edema. Electronically Signed   By: Genevive Bi M.D.   On: 10/15/2016 10:55   US Abdomen Limited Ruq  Result Date: 10/24/2016 CLINICAL DATA:  Right upper quadrant pain. EXAM: ULTRASOUND ABDOMEN LIMITED RIGHT UPPER QUADRANT COMPARISON:  MRI 06/01/2016.  CT 06/18/2014 . FINDINGS: Gallbladder: Cholecystectomy. Common bile duct: Diameter: 6.7 mm Liver: Increased echogenicity of the liver noted consistent fatty infiltration and/or hepatocellular disease. A 1.1 cm simple cyst in the left hepatic lobe. Questionable very mild ascites. Tiny right pleural effusion. IMPRESSION: 1. Cholecystectomy.  No biliary distention. 2. Increased hepatic echogenicity consistent fatty  infiltration and/or hepatocellular disease. Questions mild ascites. Small right pleural effusion. Electronically Signed   By: Maisie Fus  Register   On: 10/24/2016 10:05   CBC  Recent Labs Lab 10/29/16 1831 10/30/16 0224  WBC 7.5 5.9  HGB 10.4* 9.8*  HCT 31.0* 29.0*  PLT 162 156  MCV 104.4* 104.6*  MCH 35.2* 35.4*  MCHC 33.7 33.8  RDW 15.0* 15.0*    Chemistries   Recent Labs Lab 10/29/16 1831 10/30/16 0224 11/01/16 1222  NA 141 143 142  K 3.7 3.7 4.0  CL 111 118* 113*  CO2 25 21* 26  GLUCOSE 78 74 84  BUN 17 15 16   CREATININE 0.61 0.52 0.55  CALCIUM 8.1* 7.8* 7.6*  AST 35 33 34  ALT 47 42 38  ALKPHOS 150* 139* 138*  BILITOT 0.9 0.6 0.6   ------------------------------------------------------------------------------------------------------------------ estimated creatinine clearance is 93.1 mL/min (by C-G formula based on SCr of 0.55 mg/dL). ------------------------------------------------------------------------------------------------------------------ No results for input(s): HGBA1C in the last 72 hours. ------------------------------------------------------------------------------------------------------------------ No results for input(s): CHOL, HDL, LDLCALC, TRIG, CHOLHDL, LDLDIRECT in the last 72 hours. ------------------------------------------------------------------------------------------------------------------ No results for input(s): TSH, T4TOTAL, T3FREE, THYROIDAB in the last 72 hours.  Invalid input(s): FREET3 ------------------------------------------------------------------------------------------------------------------ No results for input(s): VITAMINB12, FOLATE, FERRITIN, TIBC, IRON, RETICCTPCT in the last 72 hours.  Coagulation profile  Recent Labs Lab 10/29/16 1831 10/30/16 0224  INR 1.04 1.05    No results for input(s): DDIMER in the last 72 hours.  Cardiac Enzymes No results for input(s): CKMB, TROPONINI, MYOGLOBIN in the last 168  hours.  Invalid input(s): CK ------------------------------------------------------------------------------------------------------------------ Invalid input(s): POCBNP  Assessment & Plan   This is a 41 y.o. female with a history of anxiety, hypertension, neuromuscular disorder, hypothyroidism,GERD, IBS, chronic lower extremity edema with stasis dermatitis osteoporosis and AVN of the right hip now being admitted with:  # Acute hepatic encephalopathy. Stat ammonia level came back at 169 yesterday. Still has confusion and asterixis. Will need Rifaximin and lactulose inpatient Will repeat ammonia level tomorrow  # Cirrhosis? Patient has hepatocellular disease on ultrasound of the abdomen. Discussed with her family and she does abuse alcohol and pain medications. Hepatitis panel negative . Alcohol and Tylenol levels were negative on admission. Will need GI follow-up.  # Anasarca due to cirrhosis and hypoalbuminemia.  On increased dose of lasix 40mg  BID  #. Weakness, gait instability. -  Seen by PT.  skill nursing facility recommended, needs OT. - Waiting for insurance authorization  #. History of AVN of the hip - Pain control  #. Anemia, chronic and stable - Monitor CBC  #. Severe protein calorie malnutrition - Added ensure today 3 times a day between meals.  #. History of hypothyroidism - Continue Synthroid  #. History of GERD - Continue Protonix     Code Status Orders        Start     Ordered   10/30/16 0200  Full code  Continuous     10/30/16 0159    Code Status History    Date Active Date Inactive Code Status Order ID Comments User Context   10/22/2016  6:25 PM 10/26/2016  4:00 PM Full Code [id number]  Pieter Partridge, MD Inpatient   10/22/2016 12:27 PM 10/22/2016  6:25 PM Full Code [id number]  Melene Plan, DO ED   10/14/2016  9:19 PM 10/16/2016  3:42 PM Full Code [id number]  Katharina Caper, MD Inpatient   09/27/2016  6:11 PM 09/29/2016  9:00 PM Full Code  [id number]  Altamese Dilling, MD Inpatient   06/01/2016  6:34 PM 06/03/2016 10:42 PM Full Code [id number]  Russella Dar, NP Inpatient   02/11/2016  2:22 AM 02/11/2016 10:15 PM Full Code [id number]  Eduard Clos, MD Inpatient     Consults  none  DVT Prophylaxis  Lovenox   Lab Results  Component Value Date   PLT 156 10/30/2016    Time  Spent in minutes  35 min Greater than 50% of time spent in care coordination and counseling patient regarding the condition and plan of care.  Milagros Loll R M.D on 11/02/2016 at 10:55 AM  Between 7am to 6pm - Pager - [phone]  After 6pm go to [email or web address] - password [email or web address] [facility]  [facility] [place] [facility]   Office  [phone]

## 2016-11-02 NOTE — Clinical Social Work Note (Signed)
CSW updated pt that [facility] was unable to make a bed offers as they are out of network with insurance. With pt's permission, CSW contacted pt's brother. CSW presented bed offers and he chose [facility]. CSW notified facility, and they will initiaite [facility] [employer]. CSW will continue to follow.

## 2016-11-03 ENCOUNTER — Inpatient Hospital Stay: Payer: Medicare HMO

## 2016-11-03 LAB — COMPREHENSIVE METABOLIC PANEL
ALT: 42 U/L (ref 14–54)
AST: 40 U/L (ref 15–41)
Albumin: 1.2 g/dL — ABNORMAL LOW (ref 3.5–5.0)
Alkaline Phosphatase: 146 U/L — ABNORMAL HIGH (ref 38–126)
Anion gap: 4 — ABNORMAL LOW (ref 5–15)
BILIRUBIN TOTAL: 0.7 mg/dL (ref 0.3–1.2)
BUN: 15 mg/dL (ref 6–20)
CO2: 25 mmol/L (ref 22–32)
Calcium: 7.4 mg/dL — ABNORMAL LOW (ref 8.9–10.3)
Chloride: 114 mmol/L — ABNORMAL HIGH (ref 101–111)
Creatinine, Ser: 0.51 mg/dL (ref 0.44–1.00)
GFR calc Af Amer: >60 mL/min (ref >60)
Glucose, Bld: 79 mg/dL (ref 65–99)
POTASSIUM: 3.6 mmol/L (ref 3.5–5.1)
Sodium: 143 mmol/L (ref 135–145)
TOTAL PROTEIN: 3.4 g/dL — AB (ref 6.5–8.1)

## 2016-11-03 LAB — CBC WITH DIFFERENTIAL/PLATELET
BASOS PCT: 1 %
Basophils Absolute: 0.1 10*3/uL (ref 0–0.1)
Eosinophils Absolute: 0.3 10*3/uL (ref 0–0.7)
Eosinophils Relative: 7 %
HEMATOCRIT: 26.8 % — AB (ref 35.0–47.0)
HEMOGLOBIN: 8.9 g/dL — AB (ref 12.0–16.0)
LYMPHS ABS: 2 10*3/uL (ref 1.0–3.6)
LYMPHS PCT: 43 %
MCH: 33.9 pg (ref 26.0–34.0)
MCHC: 33.3 g/dL (ref 32.0–36.0)
MCV: 101.8 fL — AB (ref 80.0–100.0)
MONO ABS: 0.4 10*3/uL (ref 0.2–0.9)
MONOS PCT: 9 %
NEUTROS ABS: 1.9 10*3/uL (ref 1.4–6.5)
NEUTROS PCT: 40 %
Platelets: 136 10*3/uL — ABNORMAL LOW (ref 150–440)
RBC: 2.63 MIL/uL — ABNORMAL LOW (ref 3.80–5.20)
RDW: 15 % — AB (ref 11.5–14.5)
WBC: 4.6 10*3/uL (ref 3.6–11.0)

## 2016-11-03 LAB — AMMONIA: Ammonia: 108 umol/L — ABNORMAL HIGH (ref 9–35)

## 2016-11-03 LAB — MAGNESIUM: Magnesium: 1.9 mg/dL (ref 1.7–2.4)

## 2016-11-03 MED ORDER — RIFAXIMIN 550 MG PO TABS: 550.0000 mg | ORAL_TABLET | Freq: Two times a day (BID) | ORAL | 0 refills | Status: AC

## 2016-11-03 MED ORDER — OXYCODONE-ACETAMINOPHEN 5-325 MG PO TABS: 1.0000 | ORAL_TABLET | Freq: Three times a day (TID) | ORAL | 0 refills | Status: AC | PRN

## 2016-11-03 MED ORDER — ALPRAZOLAM 0.5 MG PO TABS: 0.5000 mg | ORAL_TABLET | Freq: Two times a day (BID) | ORAL | 0 refills | Status: AC | PRN

## 2016-11-03 MED ORDER — METOLAZONE 10 MG PO TABS
10.0000 mg | ORAL_TABLET | Freq: Once | ORAL | Status: AC
  Administered 2016-11-03: 10 mg via ORAL
  Filled 2016-11-03: qty 1

## 2016-11-03 MED ORDER — LACTULOSE 10 GM/15ML PO SOLN: 30.0000 g | Freq: Two times a day (BID) | ORAL | 0 refills | Status: AC

## 2016-11-03 MED ORDER — NAPROXEN SODIUM 220 MG PO TABS: 220.0000 mg | ORAL_TABLET | Freq: Two times a day (BID) | ORAL | Status: AC

## 2016-11-03 MED ORDER — PREMIER PROTEIN SHAKE: 11.0000 [oz_av] | Freq: Three times a day (TID) | ORAL | 0 refills | Status: AC

## 2016-11-03 MED ORDER — LACTULOSE 10 GM/15ML PO SOLN
30.0000 g | Freq: Three times a day (TID) | ORAL | Status: DC
  Administered 2016-11-03 – 2016-11-05 (×7): 30 g via ORAL
  Filled 2016-11-03 (×8): qty 60

## 2016-11-03 NOTE — Progress Notes (Signed)
    Goals entered for updates to POC for insurance visibility/authorization.  Adalida Garver H. Manson Passey, PT, DPT, NCS 11/03/16, 12:18 PM [phone]

## 2016-11-03 NOTE — Progress Notes (Signed)
[employer] - [place] at [facility]                                                                                                                                                                                 Patient Demographics   Dorothy Dyer, is a 41 y.o. female, DOB - [birth year], VFI:[id number]  Admit date - 10/29/2016   Admitting Physician Tonye Royalty, DO  Outpatient Primary MD for the patient is Patient, No Pcp Per   LOS - 2  Subjective:   Confused at times. Has chronic abd pain No h/o liver disease other than recently diagnosed fatty liver.  Review of Systems:   Cannot obtain due to confusion  Vitals:   Vitals:   11/02/16 1020 11/02/16 1745 11/02/16 2028 11/03/16 0432  BP: 113/77 115/89 105/78 (!) 102/57  Pulse: 89 87 94 91  Resp: 16 16 18 18   Temp: 97.6 F (36.4 C) 98 F (36.7 C) 97.9 F (36.6 C) 98 F (36.7 C)  TempSrc: Oral Oral Oral Oral  SpO2: 100% 100% 100% 97%  Weight: 87.5 kg (193 lb)   89.6 kg (197 lb 9 oz)  Height: 5' 1.5" (1.562 m)       Wt Readings from Last 3 Encounters:  11/03/16 89.6 kg (197 lb 9 oz)  10/26/16 94.2 kg (207 lb 10.8 oz)  10/15/16 82.1 kg (181 lb)    No intake or output data in the 24 hours ending 11/03/16 1155  Physical Exam:   GENERAL:no apparent distress.  HEAD, EYES, EARS, NOSE AND THROAT: Atraumatic, normocephalic. Extraocular muscles are intact. Pupils equal and reactive to light. Sclerae anicteric. No conjunctival injection. No oro-pharyngeal erythema.  NECK: Supple. There is no jugular venous distention. No bruits, no lymphadenopathy, no thyromegaly.  HEART: Regular rate and rhythm,. No murmurs, no rubs, no clicks.  LUNGS: Clear to auscultation bilaterally. No rales or rhonchi. No wheezes.  ABDOMEN: Soft, flat, nontender, nondistended. Has good bowel sounds. No hepatosplenomegaly appreciated.  EXTREMITIES: Bilateral lower extremity edema with Unna boots in place NEUROLOGIC: The patient is  alert, awake, and oriented x3 with no focal motor or sensory deficits appreciated bilaterally.  SKIN: Moist and warm with no rashes appreciated.  Psych: Awake and alert LN: No inguinal LN enlargement Has anasarca. Asterixis RUE edema    Antibiotics   Anti-infectives    Start     Dose/Rate Route Frequency Ordered Stop   11/03/16 0000  rifaximin (XIFAXAN) 550 MG TABS tablet     550 mg Oral 2 times daily 11/03/16 1127     11/01/16 1530  rifaximin (XIFAXAN) tablet 550 mg     550 mg Oral 2 times daily 11/01/16  1419     10/30/16 0159  doxycycline (VIBRA-TABS) tablet 100 mg  Status:  Discontinued     100 mg Oral Every 12 hours 10/30/16 0159 10/30/16 1328      Medications   Scheduled Meds: . enoxaparin (LOVENOX) injection  40 mg Subcutaneous Q24H  . furosemide  40 mg Oral BID  . lactulose  30 g Oral TID  . levothyroxine  125 mcg Oral QODAY  . levothyroxine  137 mcg Oral QODAY  . multivitamin with minerals  1 tablet Oral BID  . naproxen  500 mg Oral BID WC  . nortriptyline  75 mg Oral QHS  . omega-3 acid ethyl esters  1 g Oral Daily  . pantoprazole  40 mg Oral Daily  . potassium chloride SA  20 mEq Oral Daily  . pregabalin  225 mg Oral BID  . protein supplement shake  11 oz Oral TID BM  . rifaximin  550 mg Oral BID  . sodium chloride flush  3 mL Intravenous Q12H   Continuous Infusions: . sodium chloride     PRN Meds:.sodium chloride, albuterol, bisacodyl, ipratropium, magnesium citrate, ondansetron **OR** ondansetron (ZOFRAN) IV, oxyCODONE-acetaminophen, senna-docusate, simethicone, sodium chloride flush, traMADol   Data Review:   Micro Results No results found for this or any previous visit (from the past 240 hour(s)).  Radiology Reports Dg Chest 1 View  Result Date: 10/14/2016 CLINICAL DATA:  Recent discharge from hospital, now unable to walk or stand independently. pt is having weakness and bilat lower extremity pain and swelling. Pt also has skin sloughing with bilat  ulcerated areas to her lower legs and feet. EXAM: CHEST 1 VIEW COMPARISON:  Chest x-ray dated 09/27/2016. FINDINGS: Study is hypoinspiratory with crowding of the perihilar bronchovascular markings. Given the low lung volumes, lungs appear clear. No pleural effusion or pneumothorax seen. Heart size and mediastinal contours are normal. No acute or suspicious osseous finding. IMPRESSION: Low lung volumes. No active disease. No evidence of pneumonia or pulmonary edema. Electronically Signed   By: Bary Richard M.D.   On: 10/14/2016 17:32   Ct Humerus Right W Contrast  Result Date: 10/24/2016 CLINICAL DATA:  Right arm swelling and erythema with contracture of the right hand in the setting of a right clavicular fracture and right radial nerve palsy. Concern for infection or compartment syndrome. EXAM: CT OF THE UPPER RIGHT EXTREMITY WITH CONTRAST TECHNIQUE: Multidetector CT imaging of the right humerus and forearm was performed according to the standard protocol following intravenous contrast administration. COMPARISON:  Right humerus x-rays dated August 23, 2016. CONTRAST:  ISOVUE-300 IOPAMIDOL (ISOVUE-300) INJECTION 61% FINDINGS: Bones/Joint/Cartilage There is a subacute, comminuted fracture of the midclavicle with approximately 1 shaft bone width posterior displacement of the distal fragment. There is surrounding callus formation. No additional fractures.  No glenohumeral or elbow joint effusion. Ligaments Suboptimally assessed by CT. Muscles and Tendons Calcification adjacent to the posterior greater tuberosity in the region of the distal infraspinatus tendon. Soft tissues Diffuse anasarca in the visualized soft tissues. No drainable fluid collection. There is mild mass effect on the right subclavian vein as it passes behind the healing clavicle fracture. The vein appears patent. No axillary lymphadenopathy.  The visualized right lung is clear. IMPRESSION: 1. Diffuse anasarca in the visualized soft tissues,  including the right arm and right chest and abdominal wall. No discrete drainable fluid collection is seen. Please note that the diagnosis of compartment syndrome cannot be established by imaging. 2. Subacute, healing comminuted fracture of the  mid clavicle with approximately 1 shaft bone width posterior displacement of the distal fragment. 3. Mild mass effect on the right subclavian vein as it passes behind the healing clavicle fracture. The vein appears patent. If there is clinical concern for DVT, recommend further evaluation with ultrasound. 4. Probable calcific tendinitis of the infraspinatus tendon. Electronically Signed   By: Obie Dredge M.D.   On: 10/24/2016 08:25   Ct Forearm Right W Contrast  Result Date: 10/24/2016 CLINICAL DATA:  Right arm swelling and erythema with contracture of the right hand in the setting of a right clavicular fracture and right radial nerve palsy. Concern for infection or compartment syndrome. EXAM: CT OF THE UPPER RIGHT EXTREMITY WITH CONTRAST TECHNIQUE: Multidetector CT imaging of the right humerus and forearm was performed according to the standard protocol following intravenous contrast administration. COMPARISON:  Right humerus x-rays dated August 23, 2016. CONTRAST:  ISOVUE-300 IOPAMIDOL (ISOVUE-300) INJECTION 61% FINDINGS: Bones/Joint/Cartilage There is a subacute, comminuted fracture of the midclavicle with approximately 1 shaft bone width posterior displacement of the distal fragment. There is surrounding callus formation. No additional fractures.  No glenohumeral or elbow joint effusion. Ligaments Suboptimally assessed by CT. Muscles and Tendons Calcification adjacent to the posterior greater tuberosity in the region of the distal infraspinatus tendon. Soft tissues Diffuse anasarca in the visualized soft tissues. No drainable fluid collection. There is mild mass effect on the right subclavian vein as it passes behind the healing clavicle fracture. The vein  appears patent. No axillary lymphadenopathy.  The visualized right lung is clear. IMPRESSION: 1. Diffuse anasarca in the visualized soft tissues, including the right arm and right chest and abdominal wall. No discrete drainable fluid collection is seen. Please note that the diagnosis of compartment syndrome cannot be established by imaging. 2. Subacute, healing comminuted fracture of the mid clavicle with approximately 1 shaft bone width posterior displacement of the distal fragment. 3. Mild mass effect on the right subclavian vein as it passes behind the healing clavicle fracture. The vein appears patent. If there is clinical concern for DVT, recommend further evaluation with ultrasound. 4. Probable calcific tendinitis of the infraspinatus tendon. Electronically Signed   By: Obie Dredge M.D.   On: 10/24/2016 08:25   US Venous Img Lower Bilateral  Result Date: 10/15/2016 CLINICAL DATA:  Leg swelling. EXAM: BILATERAL LOWER EXTREMITY VENOUS DOPPLER ULTRASOUND TECHNIQUE: Gray-scale sonography with graded compression, as well as color Doppler and duplex ultrasound were performed to evaluate the lower extremity deep venous systems from the level of the common femoral vein and including the common femoral, femoral, profunda femoral, popliteal and calf veins including the posterior tibial, peroneal and gastrocnemius veins when visible. The superficial great saphenous vein was also interrogated. Spectral Doppler was utilized to evaluate flow at rest and with distal augmentation maneuvers in the common femoral, femoral and popliteal veins. COMPARISON:  Knee MRI 06/01/2016 FINDINGS: RIGHT LOWER EXTREMITY Common Femoral Vein: No evidence of thrombus. Normal compressibility, respiratory phasicity and response to augmentation. Saphenofemoral Junction: No evidence of thrombus. Normal compressibility and flow on color Doppler imaging. Profunda Femoral Vein: No evidence of thrombus. Normal compressibility and flow on color  Doppler imaging. Femoral Vein: No evidence of thrombus. Normal compressibility, respiratory phasicity and response to augmentation. Popliteal Vein: No evidence of thrombus. Normal compressibility, respiratory phasicity and response to augmentation. Calf Veins: No evidence of thrombus. Normal compressibility and flow on color Doppler imaging. Superficial Great Saphenous Vein: No evidence of thrombus. Normal compressibility and flow on color Doppler  imaging. Other Findings:  Subcutaneous edema. LEFT LOWER EXTREMITY Common Femoral Vein: No evidence of thrombus. Normal compressibility, respiratory phasicity and response to augmentation. Saphenofemoral Junction: No evidence of thrombus. Normal compressibility and flow on color Doppler imaging. Profunda Femoral Vein: No evidence of thrombus. Normal compressibility and flow on color Doppler imaging. Femoral Vein: No evidence of thrombus. Normal compressibility, respiratory phasicity and response to augmentation. Popliteal Vein: No evidence of thrombus. Normal compressibility, respiratory phasicity and response to augmentation. Calf Veins: No evidence of thrombus. Normal compressibility and flow on color Doppler imaging. Superficial Great Saphenous Vein: No evidence of thrombus. Normal compressibility and flow on color Doppler imaging. Other Findings:  Subcutaneous edema. IMPRESSION: No evidence of DVT within either lower extremity. Bilateral subcutaneous edema. Electronically Signed   By: Genevive Bi M.D.   On: 10/15/2016 10:55   US Abdomen Limited Ruq  Result Date: 10/24/2016 CLINICAL DATA:  Right upper quadrant pain. EXAM: ULTRASOUND ABDOMEN LIMITED RIGHT UPPER QUADRANT COMPARISON:  MRI 06/01/2016.  CT 06/18/2014 . FINDINGS: Gallbladder: Cholecystectomy. Common bile duct: Diameter: 6.7 mm Liver: Increased echogenicity of the liver noted consistent fatty infiltration and/or hepatocellular disease. A 1.1 cm simple cyst in the left hepatic lobe. Questionable very  mild ascites. Tiny right pleural effusion. IMPRESSION: 1. Cholecystectomy.  No biliary distention. 2. Increased hepatic echogenicity consistent fatty infiltration and/or hepatocellular disease. Questions mild ascites. Small right pleural effusion. Electronically Signed   By: Maisie Fus  Register   On: 10/24/2016 10:05   CBC  Recent Labs Lab 10/29/16 1831 10/30/16 0224 11/03/16 0357  WBC 7.5 5.9 4.6  HGB 10.4* 9.8* 8.9*  HCT 31.0* 29.0* 26.8*  PLT 162 156 136*  MCV 104.4* 104.6* 101.8*  MCH 35.2* 35.4* 33.9  MCHC 33.7 33.8 33.3  RDW 15.0* 15.0* 15.0*  LYMPHSABS  --   --  2.0  MONOABS  --   --  0.4  EOSABS  --   --  0.3  BASOSABS  --   --  0.1    Chemistries   Recent Labs Lab 10/29/16 1831 10/30/16 0224 11/01/16 1222 11/03/16 0357  NA 141 143 142 143  K 3.7 3.7 4.0 3.6  CL 111 118* 113* 114*  CO2 25 21* 26 25  GLUCOSE 78 74 84 79  BUN 17 15 16 15   CREATININE 0.61 0.52 0.55 0.51  CALCIUM 8.1* 7.8* 7.6* 7.4*  MG  --   --   --  1.9  AST 35 33 34 40  ALT 47 42 38 42  ALKPHOS 150* 139* 138* 146*  BILITOT 0.9 0.6 0.6 0.7   ------------------------------------------------------------------------------------------------------------------ estimated creatinine clearance is 94.3 mL/min (by C-G formula based on SCr of 0.51 mg/dL). ------------------------------------------------------------------------------------------------------------------ No results for input(s): HGBA1C in the last 72 hours. ------------------------------------------------------------------------------------------------------------------ No results for input(s): CHOL, HDL, LDLCALC, TRIG, CHOLHDL, LDLDIRECT in the last 72 hours. ------------------------------------------------------------------------------------------------------------------ No results for input(s): TSH, T4TOTAL, T3FREE, THYROIDAB in the last 72 hours.  Invalid input(s):  FREET3 ------------------------------------------------------------------------------------------------------------------ No results for input(s): VITAMINB12, FOLATE, FERRITIN, TIBC, IRON, RETICCTPCT in the last 72 hours.  Coagulation profile  Recent Labs Lab 10/29/16 1831 10/30/16 0224  INR 1.04 1.05    No results for input(s): DDIMER in the last 72 hours.  Cardiac Enzymes No results for input(s): CKMB, TROPONINI, MYOGLOBIN in the last 168 hours.  Invalid input(s): CK ------------------------------------------------------------------------------------------------------------------ Invalid input(s): POCBNP  Assessment & Plan   This is a 41 y.o. female with a history of anxiety, hypertension, neuromuscular disorder, hypothyroidism,GERD, IBS, chronic lower extremity edema with  stasis dermatitis osteoporosis and AVN of the right hip now being admitted with:  # RUE edema Check venous doppler  # Acute hepatic encephalopathy.  Ammonia level improved but still significantly elevated.  Still has  asterixis. Will need Rifaximin and lactulose inpatient Will repeat ammonia level again tomorrow  # Cirrhosis? Patient has hepatocellular disease on ultrasound of the abdomen. Discussed with her family and she does abuse alcohol and pain medications. Hepatitis panel negative Alcohol and Tylenol levels were negative on admission. Will need GI follow-up as OP  # Anasarca due to cirrhosis and hypoalbuminemia.  On increased dose of lasix 40mg  BID One dose Metolozone  #. Weakness, gait instability. -  Seen by PT. skill nursing facility recommended. - Waiting for insurance authorization  #. History of AVN of the hip - Pain control  #. Anemia, chronic and stable - Monitor CBC  #. Severe protein calorie malnutrition - Added ensure today 3 times a day between meals.  #. History of hypothyroidism - Continue Synthroid  #. History of GERD - Continue Protonix     Code Status  Orders        Start     Ordered   10/30/16 0200  Full code  Continuous     10/30/16 0159    Code Status History    Date Active Date Inactive Code Status Order ID Comments User Context   10/22/2016  6:25 PM 10/26/2016  4:00 PM Full Code [id number]  Pieter Partridge, MD Inpatient   10/22/2016 12:27 PM 10/22/2016  6:25 PM Full Code [id number]  Melene Plan, DO ED   10/14/2016  9:19 PM 10/16/2016  3:42 PM Full Code [id number]  Katharina Caper, MD Inpatient   09/27/2016  6:11 PM 09/29/2016  9:00 PM Full Code [id number]  Altamese Dilling, MD Inpatient   06/01/2016  6:34 PM 06/03/2016 10:42 PM Full Code [id number]  Russella Dar, NP Inpatient   02/11/2016  2:22 AM 02/11/2016 10:15 PM Full Code [id number]  Eduard Clos, MD Inpatient     Consults  none  DVT Prophylaxis  Lovenox   Lab Results  Component Value Date   PLT 136 (L) 11/03/2016    Time  Spent in minutes  35 min   Milagros Loll R M.D on 11/03/2016 at 11:55 AM  Between 7am to 6pm - Pager - [phone]  After 6pm go to [email or web address] - password [email or web address] [facility]  [facility]   Office  [phone]

## 2016-11-03 NOTE — Discharge Summary (Addendum)
[employer] [facility] [place] at [facility]   PATIENT NAME: Dorothy Dyer    MR#:  [id number]  DATE OF BIRTH:  [birth year]  DATE OF ADMISSION:  10/29/2016 ADMITTING PHYSICIAN: Tonye Royalty, DO  DATE OF DISCHARGE: 11/07/2016  PRIMARY CARE PHYSICIAN: Patient, No Pcp Per   ADMISSION DIAGNOSIS:  Weakness [R53.1] Fall, initial encounter [W19.XXXA]  DISCHARGE DIAGNOSIS:  Active Problems:   Weakness   Acute hepatic encephalopathy   SECONDARY DIAGNOSIS:   Past Medical History:  Diagnosis Date  . Abdominal pain, chronic, generalized   . Acid reflux   . Anemia   . Anxiety   . Arthritis   . AVN of femur (HCC)    left hip  . Clotting disorder (HCC)    undetermined  . Cystitis   . 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 Hypertension   . Neuromuscular disorder (HCC)   . Osteoporosis   . Psoriasis   . Thyroid disease      ADMITTING HISTORY  HPI: Dorothy Dyer is a 41 y.o. female with a known history of anxiety, hypertension, neuromuscular disorder, hypothyroidism,GERD, IBS, chronic lower extremity edema with stasis dermatitis osteoporosis and AVN of the right hip presents to the emergency department for evaluation of weakness and fall.  Patient was in a usual state of health until she slowly fell to the floor while using her walker and landed on her left side. She described generalized weakness that prohibited her from getting back to standing position. She was on the floor for about five hours. Denies head trauma or loss of consciousness. Patient lives alone and has no assistance.  Patient denies fevers/chills, dizziness, chest pain, shortness of breath, N/V/C/D, abdominal pain, dysuria/frequency, changes in mental status.    Of note patient was hospitalized on 10/22/2016 for Tylenol overdose after taking an unknown quantity of Percocet as well as drinking alcohol and a possible suicide attempt.  At that time she was found to have acute hepatic and  toxic encephalopathy, anasarca related to liver disease and severe  Protein malnutrition. she was discharged on 10/26/2016. Apparently she has had multiple hospital admissions for falls, fractures and opiate overdoses.  HOSPITAL COURSE:   This is a 41 y.o.femalewith a history of anxiety, hypertension, neuromuscular disorder, hypothyroidism,GERD, IBS, chronic lower extremity edema with stasis dermatitis osteoporosis and AVN of the right hipnow being admitted with:  # RUE edema No DVT. Improved with diuresis.  # Acute encephalopathy due to elevated ammonia Ammonia level improving Started on lactulose and rifaximin Will need GI follow-up as outpatient. Her elevated ammonia levels are likely due to gastric bypass and not liver disease.  # Cirrhosis? Patient has hepatocellular disease on ultrasound of the abdomen. Discussed with her family and she does abuse alcohol and pain medications. Hepatitis panel negative Alcohol and Tylenol levels were negative on admission. Will need GI follow-up as OP with Dr. 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  ON lasix 40mg  daily  #. Weakness, gait instability. -  Seen by PT. skill nursing facility recommended.  #. History of AVN of the hip - Pain control  #. Anemia, chronic and stable  #. Severe protein calorie malnutrition - Added nutritional supplements 3 times a day between meals.  #. History of hypothyroidism - Continue Synthroid  #. History of GERD - Continue Protonix  Stable for discharge to skilled nursing facility.  CONSULTS OBTAINED:    DRUG ALLERGIES:   Allergies  Allergen Reactions  . Ibuprofen Nausea Only and Other (  See Comments)    Bad stomach pains  . Toradol [Ketorolac Tromethamine] Anxiety  . Aspirin Hives  . Cleocin [Clindamycin Hcl] Itching and Rash    DISCHARGE MEDICATIONS:   Current Discharge Medication List    START taking these medications   Details  mirtazapine (REMERON) 15  MG tablet Take 1 tablet (15 mg total) by mouth at bedtime. Qty: 30 tablet, Refills: 2    protein supplement shake (PREMIER PROTEIN) LIQD Take 325 mLs (11 oz total) by mouth 3 (three) times daily between meals. Refills: 0    rifaximin (XIFAXAN) 550 MG TABS tablet Take 1 tablet (550 mg total) by mouth 2 (two) times daily. Qty: 60 tablet, Refills: 0      CONTINUE these medications which have CHANGED   Details  ALPRAZolam (XANAX) 0.5 MG tablet Take 1 tablet (0.5 mg total) by mouth 2 (two) times daily as needed. Qty: 10 tablet, Refills: 0    lactulose (CHRONULAC) 10 GM/15ML solution Take 45 mLs (30 g total) by mouth 2 (two) times daily. Qty: 240 mL, Refills: 0    naproxen sodium (ANAPROX) 220 MG tablet Take 1 tablet (220 mg total) by mouth 2 (two) times daily with a meal.    oxyCODONE-acetaminophen (PERCOCET/ROXICET) 5-325 MG tablet Take 1 tablet by mouth every 8 (eight) hours as needed. Qty: 15 tablet, Refills: 0      CONTINUE these medications which have NOT CHANGED   Details  furosemide (LASIX) 40 MG tablet Take 1 tablet (40 mg total) by mouth daily. Qty: 30 tablet, Refills: 0    !! levothyroxine (SYNTHROID, LEVOTHROID) 125 MCG tablet TAKE 1 TABLET EVERY DAY Qty: 90 tablet, Refills: 0    !! levothyroxine (SYNTHROID, LEVOTHROID) 137 MCG tablet Take 137 mcg by mouth every other day. Alternating with 125 mcg tablet.    LYRICA 225 MG capsule Take 1 capsule by mouth 2 (two) times daily. Refills: 0    metolazone (ZAROXOLYN) 5 MG tablet TAKE 1 TABLET BY MOUTH EVERY DAY 30 MINS BEFORE LASIX 1-2 TIMES PER WEEK. Qty: 30 tablet, Refills: 0   Associated Diagnoses: Localized edema    Multiple Vitamin (MULTIVITAMIN WITH MINERALS) TABS tablet Take 1 tablet by mouth 2 (two) times daily.    nortriptyline (PAMELOR) 75 MG capsule Take 1 capsule (75 mg total) by mouth at bedtime. Qty: 30 capsule, Refills: 0    omega-3 acid ethyl esters (LOVAZA) 1 g capsule Take 1 capsule (1 g total) by mouth  daily. Qty: 30 capsule, Refills: 0    potassium chloride SA (K-DUR,KLOR-CON) 20 MEQ tablet Take 1 tablet (20 mEq total) by mouth 2 (two) times daily as needed (when taking lasix). Qty: 30 tablet, Refills: 0    RABEprazole (ACIPHEX) 20 MG tablet 1 tab po bid Qty: 60 tablet, Refills: 5    simethicone (MYLICON) 80 MG chewable tablet Chew 1 tablet (80 mg total) by mouth 4 (four) times daily as needed for flatulence. Qty: 30 tablet, Refills: 0    B Complex-C (B-COMPLEX WITH VITAMIN C) tablet Take 1 tablet by mouth daily. Qty: 30 tablet, Refills: 0    calcium-vitamin D (OSCAL WITH D) 500-200 MG-UNIT tablet Take 2 tablets by mouth daily with breakfast. Qty: 60 tablet, Refills: 0    Magnesium Oxide 400 MG CAPS Take 1 capsule (400 mg total) by mouth 2 (two) times daily. Refills: 0    protein supplement (UNJURY CHICKEN SOUP) POWD Take 27 g (8 oz total) by mouth 2 (two) times daily after a meal.  thiamine 100 MG tablet Take 1 tablet (100 mg total) by mouth daily. Qty: 30 tablet, Refills: 0     !! - Potential duplicate medications found. Please discuss with provider.    STOP taking these medications     doxycycline (VIBRA-TABS) 100 MG tablet      traMADol (ULTRAM) 50 MG tablet         Today   VITAL SIGNS:  Blood pressure (!) 98/49, pulse 89, temperature (!) 97.3 F (36.3 C), temperature source Oral, resp. rate 20, height 5' 1.5" (1.562 m), weight 81.7 kg (180 lb 3 oz), SpO2 100 %.  I/O:  No intake or output data in the 24 hours ending 11/07/16 0906  PHYSICAL EXAMINATION:  Physical Exam  GENERAL:  41 y.o.-year-old patient lying in the bed with no acute distress.  LUNGS: Normal breath sounds bilaterally, no wheezing, rales,rhonchi or crepitation. No use of accessory muscles of respiration.  CARDIOVASCULAR: S1, S2 normal. No murmurs, rubs, or gallops.  ABDOMEN: Soft, non-tender, non-distended. Bowel sounds present. No organomegaly or mass.  NEUROLOGIC: Moves all 4  extremities. PSYCHIATRIC: The patient is alert and awake SKIN: No obvious rash, lesion, or ulcer.  Anasarca RUE edema  DATA REVIEW:   CBC  Recent Labs Lab 11/06/16 0345  WBC 5.2  HGB 9.9*  HCT 29.0*  PLT 116*    Chemistries   Recent Labs Lab 11/03/16 0357 11/05/16 0519  NA 143 140  K 3.6 3.1*  CL 114* 104  CO2 25 30  GLUCOSE 79 86  BUN 15 12  CREATININE 0.51 0.79  CALCIUM 7.4* 7.4*  MG 1.9 1.7  AST 40  --   ALT 42  --   ALKPHOS 146*  --   BILITOT 0.7  --     Cardiac Enzymes No results for input(s): TROPONINI in the last 168 hours.  Microbiology Results  Results for orders placed or performed during the hospital encounter of 10/14/16  Blood Culture (routine x 2)     Status: None   Collection Time: 10/14/16  4:34 PM  Result Value Ref Range Status   Specimen Description BLOOD BLOOD LEFT HAND  Final   Special Requests   Final    BOTTLES DRAWN AEROBIC AND ANAEROBIC Blood Culture results may not be optimal due to an inadequate volume of blood received in culture bottles   Culture NO GROWTH 5 DAYS  Final   Report Status 10/19/2016 FINAL  Final  Blood Culture (routine x 2)     Status: None   Collection Time: 10/14/16  5:47 PM  Result Value Ref Range Status   Specimen Description BLOOD BLOOD RIGHT FOREARM  Final   Special Requests   Final    BOTTLES DRAWN AEROBIC AND ANAEROBIC Blood Culture adequate volume   Culture NO GROWTH 5 DAYS  Final   Report Status 10/19/2016 FINAL  Final  Urine culture     Status: Abnormal   Collection Time: 10/15/16  6:50 AM  Result Value Ref Range Status   Specimen Description URINE, RANDOM  Final   Special Requests NONE  Final   Culture MULTIPLE SPECIES PRESENT, SUGGEST RECOLLECTION (A)  Final   Report Status 10/16/2016 FINAL  Final  MRSA PCR Screening     Status: Abnormal   Collection Time: 10/15/16  9:55 AM  Result Value Ref Range Status   MRSA by PCR POSITIVE (A) NEGATIVE Final    Comment:        The GeneXpert MRSA Assay  (FDA approved for  NASAL specimens only), is one component of a comprehensive MRSA colonization surveillance program. It is not intended to diagnose MRSA infection nor to guide or monitor treatment for MRSA infections. RESULT CALLED TO, READ BACK BY AND VERIFIED WITH: ALLY SPEICHER ON 10/15/16 AT 1148 SRC     RADIOLOGY:  No results found.  Follow up with PCP in 1 week.  Management plans discussed with the patient, family and they are in agreement.  CODE STATUS:     Code Status Orders        Start     Ordered   10/30/16 0200  Full code  Continuous     10/30/16 0159    Code Status History    Date Active Date Inactive Code Status Order ID Comments User Context   10/22/2016  6:25 PM 10/26/2016  4:00 PM Full Code [id number]  Pieter Partridge, MD Inpatient   10/22/2016 12:27 PM 10/22/2016  6:25 PM Full Code [id number]  Melene Plan, DO ED   10/14/2016  9:19 PM 10/16/2016  3:42 PM Full Code [id number]  Katharina Caper, MD Inpatient   09/27/2016  6:11 PM 09/29/2016  9:00 PM Full Code [id number]  Altamese Dilling, MD Inpatient   06/01/2016  6:34 PM 06/03/2016 10:42 PM Full Code [id number]  Russella Dar, NP Inpatient   02/11/2016  2:22 AM 02/11/2016 10:15 PM Full Code [id number]  Eduard Clos, MD Inpatient      TOTAL TIME TAKING CARE OF THIS PATIENT ON DAY OF DISCHARGE: more than 30 minutes.   Milagros Loll R M.D on 11/07/2016 at 9:05 AM  Between 7am to 6pm - Pager - [phone]  After 6pm go to [email or web address] - password [email or web address]  [facility]  Office  [phone]  CC: Primary care physician; Patient, No Pcp Per  Note: This dictation was prepared with Dragon dictation along with smaller phrase technology. Any transcriptional errors that result from this process are unintentional.

## 2016-11-03 NOTE — Progress Notes (Signed)
Physical Therapy Treatment Patient Details Name: Dorothy Dyer MRN: [id number] DOB: 1975-05-14 Today's Date: 11/03/2016    History of Present Illness 41 y.o. female with a known history of anxiety, hypertension, neuromuscular disorder, hypothyroidism, GERD, IBS, chronic lower extremity edema with stasis dermatitis osteoporosis and AVN of the right hip presents to the emergency department for evaluation of weakness and fall.  Patient was in a usual state of health until she slowly fell to the floor while using her walker and landed on her left side. She described generalized weakness that prohibited her from getting back to standing position. She was on the floor for about five hours. Denies head trauma or loss of consciousness. Patient lives with her son who has autism. Of note patient was hospitalized on 10/22/2016 for Tylenol overdose after taking an unknown quantity of Percocet as well as drinking alcohol and a possible suicide attempt.  At that time she was found to have acute hepatic and toxic encephalopathy, anasarca related to liver disease and severe protein malnutrition. she was discharged on 10/26/2016. She has had multiple hospital admissions for falls, fractures and opiate overdoses.    PT Comments    Pt did well with PT session today showing good effort and willingness to participate.  She is very limited with L LE exercises generally (pain and weakness) and also struggled to use L LE well during standing at [employer]" activities.  She was very fatigued with the limited side stepping at EOB but overall did well and showed improvement over the last 2 limited sessions.  Pt lethargic but able to fully participate.     Follow Up Recommendations  SNF     Equipment Recommendations  None recommended by PT    Recommendations for Other Services       Precautions / Restrictions Precautions Precautions: Fall Restrictions Weight Bearing Restrictions: No Other Position/Activity  Restrictions: previous R UE NWBing, she apparently does not follow this anymore    Mobility  Bed Mobility Overal bed mobility: Needs Assistance Bed Mobility: Supine to Sit;Sit to Supine     Supine to sit: Min assist Sit to supine: Min assist;Mod assist   General bed mobility comments: Pt showed good effort getting on/off bed, did not need excessive cuing but still needing some assist  Transfers Overall transfer level: Needs assistance Equipment used: Rolling walker (2 wheeled) Transfers: Sit to/from Stand Sit to Stand: Min assist         General transfer comment: Pt showed good effort with getting to standing, needed multiple set up and hand/foot placement cues.  She did not need heavy assist to get to/maintain standing  Ambulation/Gait Ambulation/Gait assistance: Min assist           General Gait Details: side stepping X 3 ft to the R, then L then R again at EOB.  She did not feel strong enough to try stepping forward away from bed but was able to to shuffle sideways.  She was clearly hesitant to put much weight through the L LE but with walker use was able to support herself and at times needed only (very close) CGA to maintain balance. Pt very fatigued after this minimal effort.    Stairs            Wheelchair Mobility    Modified Rankin (Stroke Patients Only)       Balance Overall balance assessment: Needs assistance Sitting-balance support: Bilateral upper extremity supported Sitting balance-Leahy Scale: Fair     Standing balance support: Bilateral upper  extremity supported Standing balance-Leahy Scale: Poor Standing balance comment: No overt LOBs, but pt lacking confidence and generally unsteady t/o the effort.                             Cognition Arousal/Alertness: Lethargic Behavior During Therapy: WFL for tasks assessed/performed Overall Cognitive Status: Within Functional Limits for tasks assessed                                  General Comments: Pt still low energy, but better able to participate with PT than previous sessions      Exercises General Exercises - Lower Extremity Ankle Circles/Pumps: AROM;Strengthening;Both;10 reps;Supine Quad Sets: Strengthening;Both;10 reps Gluteal Sets: Strengthening;Both;15 reps;Supine Short Arc Quad: Strengthening;Both;10 reps;Supine Long Arc Quad: Strengthening;Both;10 reps;Seated Heel Slides: Strengthening;Both;10 reps;Supine Hip ABduction/ADduction: Strengthening;Both;10 reps;Supine Straight Leg Raises: Strengthening;Both;10 reps;Supine Hip Flexion/Marching: Strengthening;Both;10 reps;Supine    General Comments        Pertinent Vitals/Pain Pain Score:  (always has L LE hip/knee pain)    Home Living                      Prior Function            PT Goals (current goals can now be found in the care plan section) Progress towards PT goals: Progressing toward goals    Frequency    Min 2X/week      PT Plan Current plan remains appropriate    Co-evaluation              AM-PAC PT "6 Clicks" Daily Activity  Outcome Measure  Difficulty turning over in bed (including adjusting bedclothes, sheets and blankets)?: Unable Difficulty moving from lying on back to sitting on the side of the bed? : Unable Difficulty sitting down on and standing up from a chair with arms (e.g., wheelchair, bedside commode, etc,.)?: A Lot Help needed moving to and from a bed to chair (including a wheelchair)?: A Lot Help needed walking in hospital room?: A Lot Help needed climbing 3-5 steps with a railing? : Total 6 Click Score: 9    End of Session Equipment Utilized During Treatment: Gait belt Activity Tolerance: Patient tolerated treatment well;Patient limited by lethargy Patient left: in bed;with call bell/phone within reach;with bed alarm set   PT Visit Diagnosis: Pain;Difficulty in walking, not elsewhere classified (R26.2);Muscle weakness  (generalized) (M62.81);Repeated falls (R29.6) Pain - Right/Left: Left Pain - part of body: Hip     Time: 5093-2671 PT Time Calculation (min) (ACUTE ONLY): 28 min  Charges:  $Gait Training: 8-22 mins $Therapeutic Exercise: 8-22 mins                    G Codes:       Malachi Pro, DPT 11/03/2016, 4:05 PM

## 2016-11-03 NOTE — Clinical Social Work Note (Signed)
CSW sent updated PT notes to facility for [facility]. CSW will continue to follow.   Dede Query, MSW, LCSW  Clinical Social Worker  [phone]

## 2016-11-04 LAB — AMMONIA: AMMONIA: 90 umol/L — AB (ref 9–35)

## 2016-11-04 MED ORDER — METOLAZONE 5 MG PO TABS
5.0000 mg | ORAL_TABLET | Freq: Once | ORAL | Status: AC
  Administered 2016-11-04: 5 mg via ORAL
  Filled 2016-11-04: qty 1

## 2016-11-04 NOTE — Progress Notes (Signed)
[employer] - [place] at [facility]                                                                                                                                                                                 Patient Demographics   Dorothy Dyer, is a 41 y.o. female, DOB - [birth year], ZOX:[id number]  Admit date - 10/29/2016   Admitting Physician Tonye Royalty, DO  Outpatient Primary MD for the patient is Patient, No Pcp Per   LOS - 3  Subjective:  Less drowzy Has chronic abd pain No h/o liver disease other than recently diagnosed fatty liver. Increased urine OP  Review of Systems:   Cannot obtain due to confusion  Vitals:   Vitals:   11/03/16 1615 11/03/16 2055 11/04/16 0448 11/04/16 0500  BP: 115/81 105/69 118/64   Pulse: 82 90 84   Resp: 20 20 20    Temp: 98 F (36.7 C) 97.8 F (36.6 C) (!) 97.5 F (36.4 C)   TempSrc: Oral Oral Axillary   SpO2: 97% 100% 100%   Weight:    89.6 kg (197 lb 9.2 oz)  Height:        Wt Readings from Last 3 Encounters:  11/04/16 89.6 kg (197 lb 9.2 oz)  10/26/16 94.2 kg (207 lb 10.8 oz)  10/15/16 82.1 kg (181 lb)     Intake/Output Summary (Last 24 hours) at 11/04/16 1313 Last data filed at 11/04/16 1119  Gross per 24 hour  Intake                0 ml  Output             3950 ml  Net            -3950 ml    Physical Exam:   GENERAL:no apparent distress.  HEAD, EYES, EARS, NOSE AND THROAT: Atraumatic, normocephalic. Extraocular muscles are intact. Pupils equal and reactive to light. Sclerae anicteric. No conjunctival injection. No oro-pharyngeal erythema.  NECK: Supple. There is no jugular venous distention. No bruits, no lymphadenopathy, no thyromegaly.  HEART: Regular rate and rhythm,. No murmurs, no rubs, no clicks.  LUNGS: Clear to auscultation bilaterally. No rales or rhonchi. No wheezes.  ABDOMEN: Soft, flat, nontender, nondistended. Has good bowel sounds. No hepatosplenomegaly appreciated.   EXTREMITIES: Bilateral lower extremity edema with Unna boots in place NEUROLOGIC: The patient is alert, awake, and oriented x3 with no focal motor or sensory deficits appreciated bilaterally.  SKIN: Moist and warm with no rashes appreciated.  Psych: Awake and alert LN: No inguinal LN enlargement Has anasarca. Asterixis RUE edema    Antibiotics   Anti-infectives    Start  Dose/Rate Route Frequency Ordered Stop   11/03/16 0000  rifaximin (XIFAXAN) 550 MG TABS tablet     550 mg Oral 2 times daily 11/03/16 1127     11/01/16 1530  rifaximin (XIFAXAN) tablet 550 mg     550 mg Oral 2 times daily 11/01/16 1419     10/30/16 0159  doxycycline (VIBRA-TABS) tablet 100 mg  Status:  Discontinued     100 mg Oral Every 12 hours 10/30/16 0159 10/30/16 1328      Medications   Scheduled Meds: . enoxaparin (LOVENOX) injection  40 mg Subcutaneous Q24H  . furosemide  40 mg Oral BID  . lactulose  30 g Oral TID  . levothyroxine  125 mcg Oral QODAY  . levothyroxine  137 mcg Oral QODAY  . multivitamin with minerals  1 tablet Oral BID  . naproxen  500 mg Oral BID WC  . nortriptyline  75 mg Oral QHS  . omega-3 acid ethyl esters  1 g Oral Daily  . pantoprazole  40 mg Oral Daily  . potassium chloride SA  20 mEq Oral Daily  . pregabalin  225 mg Oral BID  . protein supplement shake  11 oz Oral TID BM  . rifaximin  550 mg Oral BID  . sodium chloride flush  3 mL Intravenous Q12H   Continuous Infusions: . sodium chloride     PRN Meds:.sodium chloride, albuterol, bisacodyl, ipratropium, magnesium citrate, ondansetron **OR** ondansetron (ZOFRAN) IV, oxyCODONE-acetaminophen, senna-docusate, simethicone, sodium chloride flush, traMADol   Data Review:   Micro Results No results found for this or any previous visit (from the past 240 hour(s)).  Radiology Reports Dg Chest 1 View  Result Date: 10/14/2016 CLINICAL DATA:  Recent discharge from hospital, now unable to walk or stand independently. pt is  having weakness and bilat lower extremity pain and swelling. Pt also has skin sloughing with bilat ulcerated areas to her lower legs and feet. EXAM: CHEST 1 VIEW COMPARISON:  Chest x-ray dated 09/27/2016. FINDINGS: Study is hypoinspiratory with crowding of the perihilar bronchovascular markings. Given the low lung volumes, lungs appear clear. No pleural effusion or pneumothorax seen. Heart size and mediastinal contours are normal. No acute or suspicious osseous finding. IMPRESSION: Low lung volumes. No active disease. No evidence of pneumonia or pulmonary edema. Electronically Signed   By: Bary Richard M.D.   On: 10/14/2016 17:32   Ct Humerus Right W Contrast  Result Date: 10/24/2016 CLINICAL DATA:  Right arm swelling and erythema with contracture of the right hand in the setting of a right clavicular fracture and right radial nerve palsy. Concern for infection or compartment syndrome. EXAM: CT OF THE UPPER RIGHT EXTREMITY WITH CONTRAST TECHNIQUE: Multidetector CT imaging of the right humerus and forearm was performed according to the standard protocol following intravenous contrast administration. COMPARISON:  Right humerus x-rays dated August 23, 2016. CONTRAST:  ISOVUE-300 IOPAMIDOL (ISOVUE-300) INJECTION 61% FINDINGS: Bones/Joint/Cartilage There is a subacute, comminuted fracture of the midclavicle with approximately 1 shaft bone width posterior displacement of the distal fragment. There is surrounding callus formation. No additional fractures.  No glenohumeral or elbow joint effusion. Ligaments Suboptimally assessed by CT. Muscles and Tendons Calcification adjacent to the posterior greater tuberosity in the region of the distal infraspinatus tendon. Soft tissues Diffuse anasarca in the visualized soft tissues. No drainable fluid collection. There is mild mass effect on the right subclavian vein as it passes behind the healing clavicle fracture. The vein appears patent. No axillary lymphadenopathy.  The  visualized right lung is clear. IMPRESSION: 1. Diffuse anasarca in the visualized soft tissues, including the right arm and right chest and abdominal wall. No discrete drainable fluid collection is seen. Please note that the diagnosis of compartment syndrome cannot be established by imaging. 2. Subacute, healing comminuted fracture of the mid clavicle with approximately 1 shaft bone width posterior displacement of the distal fragment. 3. Mild mass effect on the right subclavian vein as it passes behind the healing clavicle fracture. The vein appears patent. If there is clinical concern for DVT, recommend further evaluation with ultrasound. 4. Probable calcific tendinitis of the infraspinatus tendon. Electronically Signed   By: Obie Dredge M.D.   On: 10/24/2016 08:25   Ct Forearm Right W Contrast  Result Date: 10/24/2016 CLINICAL DATA:  Right arm swelling and erythema with contracture of the right hand in the setting of a right clavicular fracture and right radial nerve palsy. Concern for infection or compartment syndrome. EXAM: CT OF THE UPPER RIGHT EXTREMITY WITH CONTRAST TECHNIQUE: Multidetector CT imaging of the right humerus and forearm was performed according to the standard protocol following intravenous contrast administration. COMPARISON:  Right humerus x-rays dated August 23, 2016. CONTRAST:  ISOVUE-300 IOPAMIDOL (ISOVUE-300) INJECTION 61% FINDINGS: Bones/Joint/Cartilage There is a subacute, comminuted fracture of the midclavicle with approximately 1 shaft bone width posterior displacement of the distal fragment. There is surrounding callus formation. No additional fractures.  No glenohumeral or elbow joint effusion. Ligaments Suboptimally assessed by CT. Muscles and Tendons Calcification adjacent to the posterior greater tuberosity in the region of the distal infraspinatus tendon. Soft tissues Diffuse anasarca in the visualized soft tissues. No drainable fluid collection. There is mild mass  effect on the right subclavian vein as it passes behind the healing clavicle fracture. The vein appears patent. No axillary lymphadenopathy.  The visualized right lung is clear. IMPRESSION: 1. Diffuse anasarca in the visualized soft tissues, including the right arm and right chest and abdominal wall. No discrete drainable fluid collection is seen. Please note that the diagnosis of compartment syndrome cannot be established by imaging. 2. Subacute, healing comminuted fracture of the mid clavicle with approximately 1 shaft bone width posterior displacement of the distal fragment. 3. Mild mass effect on the right subclavian vein as it passes behind the healing clavicle fracture. The vein appears patent. If there is clinical concern for DVT, recommend further evaluation with ultrasound. 4. Probable calcific tendinitis of the infraspinatus tendon. Electronically Signed   By: Obie Dredge M.D.   On: 10/24/2016 08:25   US Venous Img Lower Bilateral  Result Date: 10/15/2016 CLINICAL DATA:  Leg swelling. EXAM: BILATERAL LOWER EXTREMITY VENOUS DOPPLER ULTRASOUND TECHNIQUE: Gray-scale sonography with graded compression, as well as color Doppler and duplex ultrasound were performed to evaluate the lower extremity deep venous systems from the level of the common femoral vein and including the common femoral, femoral, profunda femoral, popliteal and calf veins including the posterior tibial, peroneal and gastrocnemius veins when visible. The superficial great saphenous vein was also interrogated. Spectral Doppler was utilized to evaluate flow at rest and with distal augmentation maneuvers in the common femoral, femoral and popliteal veins. COMPARISON:  Knee MRI 06/01/2016 FINDINGS: RIGHT LOWER EXTREMITY Common Femoral Vein: No evidence of thrombus. Normal compressibility, respiratory phasicity and response to augmentation. Saphenofemoral Junction: No evidence of thrombus. Normal compressibility and flow on color Doppler  imaging. Profunda Femoral Vein: No evidence of thrombus. Normal compressibility and flow on color Doppler imaging. Femoral Vein: No evidence of thrombus.  Normal compressibility, respiratory phasicity and response to augmentation. Popliteal Vein: No evidence of thrombus. Normal compressibility, respiratory phasicity and response to augmentation. Calf Veins: No evidence of thrombus. Normal compressibility and flow on color Doppler imaging. Superficial Great Saphenous Vein: No evidence of thrombus. Normal compressibility and flow on color Doppler imaging. Other Findings:  Subcutaneous edema. LEFT LOWER EXTREMITY Common Femoral Vein: No evidence of thrombus. Normal compressibility, respiratory phasicity and response to augmentation. Saphenofemoral Junction: No evidence of thrombus. Normal compressibility and flow on color Doppler imaging. Profunda Femoral Vein: No evidence of thrombus. Normal compressibility and flow on color Doppler imaging. Femoral Vein: No evidence of thrombus. Normal compressibility, respiratory phasicity and response to augmentation. Popliteal Vein: No evidence of thrombus. Normal compressibility, respiratory phasicity and response to augmentation. Calf Veins: No evidence of thrombus. Normal compressibility and flow on color Doppler imaging. Superficial Great Saphenous Vein: No evidence of thrombus. Normal compressibility and flow on color Doppler imaging. Other Findings:  Subcutaneous edema. IMPRESSION: No evidence of DVT within either lower extremity. Bilateral subcutaneous edema. Electronically Signed   By: Genevive Bi M.D.   On: 10/15/2016 10:55   US Venous Img Upper Uni Right  Result Date: 11/03/2016 CLINICAL DATA:  EDEMA OF THE RIGHT ARM. EXAM: RIGHT UPPER EXTREMITY VENOUS DOPPLER ULTRASOUND TECHNIQUE: Gray-scale sonography with graded compression, as well as color Doppler and duplex ultrasound were performed to evaluate the upper extremity deep venous system from the level of the  subclavian vein and including the jugular, axillary, basilic, radial, ulnar and upper cephalic vein. Spectral Doppler was utilized to evaluate flow at rest and with distal augmentation maneuvers. COMPARISON:  None. FINDINGS: Contralateral Subclavian Vein: Respiratory phasicity is normal and symmetric with the symptomatic side. No evidence of thrombus. Normal compressibility. Internal Jugular Vein: No evidence of thrombus. Normal compressibility, respiratory phasicity and response to augmentation. Subclavian Vein: No evidence of thrombus. Normal compressibility, respiratory phasicity and response to augmentation. Axillary Vein: No evidence of thrombus. Normal compressibility, respiratory phasicity and response to augmentation. Cephalic Vein: No evidence of thrombus. Normal compressibility, respiratory phasicity and response to augmentation. Basilic Vein: Limited visualization due to edema. No evidence of thrombus. Brachial Veins: No evidence of thrombus. Normal compressibility, respiratory phasicity and response to augmentation. Radial Veins: No evidence of thrombus. Normal compressibility, respiratory phasicity and response to augmentation. Ulnar Veins: No evidence of thrombus. Normal compressibility, respiratory phasicity and response to augmentation. Venous Reflux:  None visualized. Other Findings:  None visualized. IMPRESSION: No evidence of DVT within the right upper extremity. Electronically Signed   By: Francene Boyers M.D.   On: 11/03/2016 15:27   US Abdomen Limited Ruq  Result Date: 10/24/2016 CLINICAL DATA:  Right upper quadrant pain. EXAM: ULTRASOUND ABDOMEN LIMITED RIGHT UPPER QUADRANT COMPARISON:  MRI 06/01/2016.  CT 06/18/2014 . FINDINGS: Gallbladder: Cholecystectomy. Common bile duct: Diameter: 6.7 mm Liver: Increased echogenicity of the liver noted consistent fatty infiltration and/or hepatocellular disease. A 1.1 cm simple cyst in the left hepatic lobe. Questionable very mild ascites. Tiny right  pleural effusion. IMPRESSION: 1. Cholecystectomy.  No biliary distention. 2. Increased hepatic echogenicity consistent fatty infiltration and/or hepatocellular disease. Questions mild ascites. Small right pleural effusion. Electronically Signed   By: Maisie Fus  Register   On: 10/24/2016 10:05   CBC  Recent Labs Lab 10/29/16 1831 10/30/16 0224 11/03/16 0357  WBC 7.5 5.9 4.6  HGB 10.4* 9.8* 8.9*  HCT 31.0* 29.0* 26.8*  PLT 162 156 136*  MCV 104.4* 104.6* 101.8*  MCH 35.2* 35.4* 33.9  MCHC  33.7 33.8 33.3  RDW 15.0* 15.0* 15.0*  LYMPHSABS  --   --  2.0  MONOABS  --   --  0.4  EOSABS  --   --  0.3  BASOSABS  --   --  0.1    Chemistries   Recent Labs Lab 10/29/16 1831 10/30/16 0224 11/01/16 1222 11/03/16 0357  NA 141 143 142 143  K 3.7 3.7 4.0 3.6  CL 111 118* 113* 114*  CO2 25 21* 26 25  GLUCOSE 78 74 84 79  BUN 17 15 16 15   CREATININE 0.61 0.52 0.55 0.51  CALCIUM 8.1* 7.8* 7.6* 7.4*  MG  --   --   --  1.9  AST 35 33 34 40  ALT 47 42 38 42  ALKPHOS 150* 139* 138* 146*  BILITOT 0.9 0.6 0.6 0.7   ------------------------------------------------------------------------------------------------------------------ estimated creatinine clearance is 94.3 mL/min (by C-G formula based on SCr of 0.51 mg/dL). ------------------------------------------------------------------------------------------------------------------ No results for input(s): HGBA1C in the last 72 hours. ------------------------------------------------------------------------------------------------------------------ No results for input(s): CHOL, HDL, LDLCALC, TRIG, CHOLHDL, LDLDIRECT in the last 72 hours. ------------------------------------------------------------------------------------------------------------------ No results for input(s): TSH, T4TOTAL, T3FREE, THYROIDAB in the last 72 hours.  Invalid input(s):  FREET3 ------------------------------------------------------------------------------------------------------------------ No results for input(s): VITAMINB12, FOLATE, FERRITIN, TIBC, IRON, RETICCTPCT in the last 72 hours.  Coagulation profile  Recent Labs Lab 10/29/16 1831 10/30/16 0224  INR 1.04 1.05    No results for input(s): DDIMER in the last 72 hours.  Cardiac Enzymes No results for input(s): CKMB, TROPONINI, MYOGLOBIN in the last 168 hours.  Invalid input(s): CK ------------------------------------------------------------------------------------------------------------------ Invalid input(s): POCBNP  Assessment & Plan   This is a 42 y.o. female with a history of anxiety, hypertension, neuromuscular disorder, hypothyroidism,GERD, IBS, chronic lower extremity edema with stasis dermatitis osteoporosis and AVN of the right hip now being admitted with:  # RUE edema  venous doppler - No DT. Improved  # Acute hepatic encephalopathy.  Ammonia level improving but still high On Rifaximin and lactulose  # Cirrhosis? Patient has hepatocellular disease on ultrasound of the abdomen. Discussed with her family and she does abuse alcohol and pain medications. Hepatitis panel negative Alcohol and Tylenol levels were negative on admission. Will need GI follow-up as OP  # Anasarca due to cirrhosis and hypoalbuminemia.  On increased dose of lasix 40mg  BID One dose Metolozone again today  #. Weakness, gait instability. -  Seen by PT. skill nursing facility recommended. - Waiting for insurance authorization  #. History of AVN of the hip - Pain control  #. Anemia, chronic and stable - Monitor CBC  #. Severe protein calorie malnutrition - Added ensure today 3 times a day between meals.  #. History of hypothyroidism - Continue Synthroid  #. History of GERD - Continue Protonix     Code Status Orders        Start     Ordered   10/30/16 0200  Full code  Continuous      10/30/16 0159    Code Status History    Date Active Date Inactive Code Status Order ID Comments User Context   10/22/2016  6:25 PM 10/26/2016  4:00 PM Full Code [id number]  Pieter Partridge, MD Inpatient   10/22/2016 12:27 PM 10/22/2016  6:25 PM Full Code [id number]  Melene Plan, DO ED   10/14/2016  9:19 PM 10/16/2016  3:42 PM Full Code [id number]  Katharina Caper, MD Inpatient   09/27/2016  6:11 PM 09/29/2016  9:00 PM Full Code [id number]  Altamese Dilling, MD Inpatient   06/01/2016  6:34 PM 06/03/2016 10:42 PM Full Code [id number]  Russella Dar, NP Inpatient   02/11/2016  2:22 AM 02/11/2016 10:15 PM Full Code [id number]  Eduard Clos, MD Inpatient     Consults  none  DVT Prophylaxis  Lovenox   Lab Results  Component Value Date   PLT 136 (L) 11/03/2016    Time  Spent in minutes  35 min   Milagros Loll R M.D on 11/04/2016 at 1:13 PM  Between 7am to 6pm - Pager - [phone]  After 6pm go to [email or web address] - password [email or web address] [facility]  [facility]   Office  [phone]

## 2016-11-05 LAB — BASIC METABOLIC PANEL
Anion gap: 6 (ref 5–15)
BUN: 12 mg/dL (ref 6–20)
CALCIUM: 7.4 mg/dL — AB (ref 8.9–10.3)
CO2: 30 mmol/L (ref 22–32)
CREATININE: 0.79 mg/dL (ref 0.44–1.00)
Chloride: 104 mmol/L (ref 101–111)
Glucose, Bld: 86 mg/dL (ref 65–99)
Potassium: 3.1 mmol/L — ABNORMAL LOW (ref 3.5–5.1)
SODIUM: 140 mmol/L (ref 135–145)

## 2016-11-05 LAB — MITOCHONDRIAL ANTIBODIES: Mitochondrial M2 Ab, IgG: 3.9 Units (ref 0.0–20.0)

## 2016-11-05 LAB — MAGNESIUM: MAGNESIUM: 1.7 mg/dL (ref 1.7–2.4)

## 2016-11-05 LAB — AMMONIA: AMMONIA: 117 umol/L — AB (ref 9–35)

## 2016-11-05 MED ORDER — POTASSIUM CHLORIDE CRYS ER 20 MEQ PO TBCR
40.0000 meq | EXTENDED_RELEASE_TABLET | Freq: Once | ORAL | Status: AC
  Administered 2016-11-05: 18:00:00 40 meq via ORAL
  Filled 2016-11-05: qty 2

## 2016-11-05 MED ORDER — LACTULOSE 10 GM/15ML PO SOLN
30.0000 g | Freq: Two times a day (BID) | ORAL | Status: DC
  Administered 2016-11-05 – 2016-11-06 (×2): 30 g via ORAL
  Filled 2016-11-05 (×2): qty 60

## 2016-11-05 NOTE — Plan of Care (Signed)
Problem: Activity: Goal: Risk for activity intolerance will decrease Outcome: Progressing Pt oob to bsc several times this shift; oob to chair x 1 this shift; pt used front wheeled walker with moderate staff assist x 1; pt tolerated well; encouraged pt to  increase activity with each shift

## 2016-11-05 NOTE — Plan of Care (Signed)
Problem: Skin Integrity: Goal: Risk for impaired skin integrity will decrease Outcome: Not Progressing Pt has multiple wounds. WOC following  Problem: Activity: Goal: Risk for activity intolerance will decrease Outcome: Not Progressing Pt lying in bed throughout the night. States she is too weak to get OOB

## 2016-11-05 NOTE — Clinical Social Work Note (Signed)
CSW has not received any updates to the [place] authorization at this time.The plan remains that the patient will discharge to [facility] pending insurance authorization. CSW will continue to follow to facilitate discharge.  Argentina Ponder, MSW, Theresia Majors [phone]

## 2016-11-05 NOTE — Progress Notes (Signed)
[employer] - [place] at [facility]                                                                                                                                                                                 Patient Demographics   Dorothy Dyer, is a 41 y.o. female, DOB - [birth year], ZOX:[id number]  Admit date - 10/29/2016   Admitting Physician Tonye Royalty, DO  Outpatient Primary MD for the patient is Patient, No Pcp Per   LOS - 4  Subjective:  Sitting up in a chair. Feels weak. Continues to have chronic abdominal pain. Is having multiple bowel movements every day.  Review of Systems:   Cannot obtain due to confusion  Vitals:   Vitals:   11/05/16 0442 11/05/16 0443 11/05/16 0500 11/05/16 0530  BP: (!) 95/59 (!) 92/57  103/60  Pulse: 81 81  80  Resp: 20     Temp: (!) 96.8 F (36 C)     TempSrc: Axillary     SpO2: 99%     Weight:   86.6 kg (191 lb)   Height:        Wt Readings from Last 3 Encounters:  11/05/16 86.6 kg (191 lb)  10/26/16 94.2 kg (207 lb 10.8 oz)  10/15/16 82.1 kg (181 lb)     Intake/Output Summary (Last 24 hours) at 11/05/16 1135 Last data filed at 11/05/16 0024  Gross per 24 hour  Intake                0 ml  Output             1750 ml  Net            -1750 ml    Physical Exam:   GENERAL:no apparent distress.  HEAD, EYES, EARS, NOSE AND THROAT: Atraumatic, normocephalic. Extraocular muscles are intact. Pupils equal and reactive to light. Sclerae anicteric. No conjunctival injection. No oro-pharyngeal erythema.  NECK: Supple. There is no jugular venous distention. No bruits, no lymphadenopathy, no thyromegaly.  HEART: Regular rate and rhythm,. No murmurs, no rubs, no clicks.  LUNGS: Clear to auscultation bilaterally. No rales or rhonchi. No wheezes.  ABDOMEN: Soft, flat, nontender, nondistended. Has good bowel sounds. No hepatosplenomegaly appreciated.  EXTREMITIES: Bilateral lower extremity edema with Unna boots in  place NEUROLOGIC: The patient is alert, awake, and oriented x3 with no focal motor or sensory deficits appreciated bilaterally.  SKIN: Moist and warm with no rashes appreciated.  Psych: Awake and alert LN: No inguinal LN enlargement Improving anasarca    Antibiotics   Anti-infectives    Start     Dose/Rate Route Frequency Ordered Stop   11/03/16 0000  rifaximin (XIFAXAN) 550 MG TABS tablet     550 mg Oral 2 times daily 11/03/16 1127     11/01/16 1530  rifaximin (XIFAXAN) tablet 550 mg     550 mg Oral 2 times daily 11/01/16 1419     10/30/16 0159  doxycycline (VIBRA-TABS) tablet 100 mg  Status:  Discontinued     100 mg Oral Every 12 hours 10/30/16 0159 10/30/16 1328      Medications   Scheduled Meds: . enoxaparin (LOVENOX) injection  40 mg Subcutaneous Q24H  . furosemide  40 mg Oral BID  . lactulose  30 g Oral BID  . levothyroxine  125 mcg Oral QODAY  . levothyroxine  137 mcg Oral QODAY  . multivitamin with minerals  1 tablet Oral BID  . naproxen  500 mg Oral BID WC  . nortriptyline  75 mg Oral QHS  . omega-3 acid ethyl esters  1 g Oral Daily  . pantoprazole  40 mg Oral Daily  . potassium chloride SA  20 mEq Oral Daily  . potassium chloride  40 mEq Oral Once  . pregabalin  225 mg Oral BID  . protein supplement shake  11 oz Oral TID BM  . rifaximin  550 mg Oral BID  . sodium chloride flush  3 mL Intravenous Q12H   Continuous Infusions: . sodium chloride     PRN Meds:.sodium chloride, albuterol, bisacodyl, ipratropium, magnesium citrate, ondansetron **OR** ondansetron (ZOFRAN) IV, oxyCODONE-acetaminophen, senna-docusate, simethicone, sodium chloride flush, traMADol   Data Review:   Micro Results No results found for this or any previous visit (from the past 240 hour(s)).  Radiology Reports Dg Chest 1 View  Result Date: 10/14/2016 CLINICAL DATA:  Recent discharge from hospital, now unable to walk or stand independently. pt is having weakness and bilat lower extremity  pain and swelling. Pt also has skin sloughing with bilat ulcerated areas to her lower legs and feet. EXAM: CHEST 1 VIEW COMPARISON:  Chest x-ray dated 09/27/2016. FINDINGS: Study is hypoinspiratory with crowding of the perihilar bronchovascular markings. Given the low lung volumes, lungs appear clear. No pleural effusion or pneumothorax seen. Heart size and mediastinal contours are normal. No acute or suspicious osseous finding. IMPRESSION: Low lung volumes. No active disease. No evidence of pneumonia or pulmonary edema. Electronically Signed   By: Bary Richard M.D.   On: 10/14/2016 17:32   Ct Humerus Right W Contrast  Result Date: 10/24/2016 CLINICAL DATA:  Right arm swelling and erythema with contracture of the right hand in the setting of a right clavicular fracture and right radial nerve palsy. Concern for infection or compartment syndrome. EXAM: CT OF THE UPPER RIGHT EXTREMITY WITH CONTRAST TECHNIQUE: Multidetector CT imaging of the right humerus and forearm was performed according to the standard protocol following intravenous contrast administration. COMPARISON:  Right humerus x-rays dated August 23, 2016. CONTRAST:  ISOVUE-300 IOPAMIDOL (ISOVUE-300) INJECTION 61% FINDINGS: Bones/Joint/Cartilage There is a subacute, comminuted fracture of the midclavicle with approximately 1 shaft bone width posterior displacement of the distal fragment. There is surrounding callus formation. No additional fractures.  No glenohumeral or elbow joint effusion. Ligaments Suboptimally assessed by CT. Muscles and Tendons Calcification adjacent to the posterior greater tuberosity in the region of the distal infraspinatus tendon. Soft tissues Diffuse anasarca in the visualized soft tissues. No drainable fluid collection. There is mild mass effect on the right subclavian vein as it passes behind the healing clavicle fracture. The vein appears patent. No axillary lymphadenopathy.  The visualized right  lung is clear.  IMPRESSION: 1. Diffuse anasarca in the visualized soft tissues, including the right arm and right chest and abdominal wall. No discrete drainable fluid collection is seen. Please note that the diagnosis of compartment syndrome cannot be established by imaging. 2. Subacute, healing comminuted fracture of the mid clavicle with approximately 1 shaft bone width posterior displacement of the distal fragment. 3. Mild mass effect on the right subclavian vein as it passes behind the healing clavicle fracture. The vein appears patent. If there is clinical concern for DVT, recommend further evaluation with ultrasound. 4. Probable calcific tendinitis of the infraspinatus tendon. Electronically Signed   By: Obie Dredge M.D.   On: 10/24/2016 08:25   Ct Forearm Right W Contrast  Result Date: 10/24/2016 CLINICAL DATA:  Right arm swelling and erythema with contracture of the right hand in the setting of a right clavicular fracture and right radial nerve palsy. Concern for infection or compartment syndrome. EXAM: CT OF THE UPPER RIGHT EXTREMITY WITH CONTRAST TECHNIQUE: Multidetector CT imaging of the right humerus and forearm was performed according to the standard protocol following intravenous contrast administration. COMPARISON:  Right humerus x-rays dated August 23, 2016. CONTRAST:  ISOVUE-300 IOPAMIDOL (ISOVUE-300) INJECTION 61% FINDINGS: Bones/Joint/Cartilage There is a subacute, comminuted fracture of the midclavicle with approximately 1 shaft bone width posterior displacement of the distal fragment. There is surrounding callus formation. No additional fractures.  No glenohumeral or elbow joint effusion. Ligaments Suboptimally assessed by CT. Muscles and Tendons Calcification adjacent to the posterior greater tuberosity in the region of the distal infraspinatus tendon. Soft tissues Diffuse anasarca in the visualized soft tissues. No drainable fluid collection. There is mild mass effect on the right subclavian vein  as it passes behind the healing clavicle fracture. The vein appears patent. No axillary lymphadenopathy.  The visualized right lung is clear. IMPRESSION: 1. Diffuse anasarca in the visualized soft tissues, including the right arm and right chest and abdominal wall. No discrete drainable fluid collection is seen. Please note that the diagnosis of compartment syndrome cannot be established by imaging. 2. Subacute, healing comminuted fracture of the mid clavicle with approximately 1 shaft bone width posterior displacement of the distal fragment. 3. Mild mass effect on the right subclavian vein as it passes behind the healing clavicle fracture. The vein appears patent. If there is clinical concern for DVT, recommend further evaluation with ultrasound. 4. Probable calcific tendinitis of the infraspinatus tendon. Electronically Signed   By: Obie Dredge M.D.   On: 10/24/2016 08:25   US Venous Img Lower Bilateral  Result Date: 10/15/2016 CLINICAL DATA:  Leg swelling. EXAM: BILATERAL LOWER EXTREMITY VENOUS DOPPLER ULTRASOUND TECHNIQUE: Gray-scale sonography with graded compression, as well as color Doppler and duplex ultrasound were performed to evaluate the lower extremity deep venous systems from the level of the common femoral vein and including the common femoral, femoral, profunda femoral, popliteal and calf veins including the posterior tibial, peroneal and gastrocnemius veins when visible. The superficial great saphenous vein was also interrogated. Spectral Doppler was utilized to evaluate flow at rest and with distal augmentation maneuvers in the common femoral, femoral and popliteal veins. COMPARISON:  Knee MRI 06/01/2016 FINDINGS: RIGHT LOWER EXTREMITY Common Femoral Vein: No evidence of thrombus. Normal compressibility, respiratory phasicity and response to augmentation. Saphenofemoral Junction: No evidence of thrombus. Normal compressibility and flow on color Doppler imaging. Profunda Femoral Vein: No  evidence of thrombus. Normal compressibility and flow on color Doppler imaging. Femoral Vein: No evidence of thrombus. Normal  compressibility, respiratory phasicity and response to augmentation. Popliteal Vein: No evidence of thrombus. Normal compressibility, respiratory phasicity and response to augmentation. Calf Veins: No evidence of thrombus. Normal compressibility and flow on color Doppler imaging. Superficial Great Saphenous Vein: No evidence of thrombus. Normal compressibility and flow on color Doppler imaging. Other Findings:  Subcutaneous edema. LEFT LOWER EXTREMITY Common Femoral Vein: No evidence of thrombus. Normal compressibility, respiratory phasicity and response to augmentation. Saphenofemoral Junction: No evidence of thrombus. Normal compressibility and flow on color Doppler imaging. Profunda Femoral Vein: No evidence of thrombus. Normal compressibility and flow on color Doppler imaging. Femoral Vein: No evidence of thrombus. Normal compressibility, respiratory phasicity and response to augmentation. Popliteal Vein: No evidence of thrombus. Normal compressibility, respiratory phasicity and response to augmentation. Calf Veins: No evidence of thrombus. Normal compressibility and flow on color Doppler imaging. Superficial Great Saphenous Vein: No evidence of thrombus. Normal compressibility and flow on color Doppler imaging. Other Findings:  Subcutaneous edema. IMPRESSION: No evidence of DVT within either lower extremity. Bilateral subcutaneous edema. Electronically Signed   By: Genevive Bi M.D.   On: 10/15/2016 10:55   US Venous Img Upper Uni Right  Result Date: 11/03/2016 CLINICAL DATA:  EDEMA OF THE RIGHT ARM. EXAM: RIGHT UPPER EXTREMITY VENOUS DOPPLER ULTRASOUND TECHNIQUE: Gray-scale sonography with graded compression, as well as color Doppler and duplex ultrasound were performed to evaluate the upper extremity deep venous system from the level of the subclavian vein and including the  jugular, axillary, basilic, radial, ulnar and upper cephalic vein. Spectral Doppler was utilized to evaluate flow at rest and with distal augmentation maneuvers. COMPARISON:  None. FINDINGS: Contralateral Subclavian Vein: Respiratory phasicity is normal and symmetric with the symptomatic side. No evidence of thrombus. Normal compressibility. Internal Jugular Vein: No evidence of thrombus. Normal compressibility, respiratory phasicity and response to augmentation. Subclavian Vein: No evidence of thrombus. Normal compressibility, respiratory phasicity and response to augmentation. Axillary Vein: No evidence of thrombus. Normal compressibility, respiratory phasicity and response to augmentation. Cephalic Vein: No evidence of thrombus. Normal compressibility, respiratory phasicity and response to augmentation. Basilic Vein: Limited visualization due to edema. No evidence of thrombus. Brachial Veins: No evidence of thrombus. Normal compressibility, respiratory phasicity and response to augmentation. Radial Veins: No evidence of thrombus. Normal compressibility, respiratory phasicity and response to augmentation. Ulnar Veins: No evidence of thrombus. Normal compressibility, respiratory phasicity and response to augmentation. Venous Reflux:  None visualized. Other Findings:  None visualized. IMPRESSION: No evidence of DVT within the right upper extremity. Electronically Signed   By: Francene Boyers M.D.   On: 11/03/2016 15:27   US Abdomen Limited Ruq  Result Date: 10/24/2016 CLINICAL DATA:  Right upper quadrant pain. EXAM: ULTRASOUND ABDOMEN LIMITED RIGHT UPPER QUADRANT COMPARISON:  MRI 06/01/2016.  CT 06/18/2014 . FINDINGS: Gallbladder: Cholecystectomy. Common bile duct: Diameter: 6.7 mm Liver: Increased echogenicity of the liver noted consistent fatty infiltration and/or hepatocellular disease. A 1.1 cm simple cyst in the left hepatic lobe. Questionable very mild ascites. Tiny right pleural effusion. IMPRESSION: 1.  Cholecystectomy.  No biliary distention. 2. Increased hepatic echogenicity consistent fatty infiltration and/or hepatocellular disease. Questions mild ascites. Small right pleural effusion. Electronically Signed   By: Maisie Fus  Register   On: 10/24/2016 10:05   CBC  Recent Labs Lab 10/29/16 1831 10/30/16 0224 11/03/16 0357  WBC 7.5 5.9 4.6  HGB 10.4* 9.8* 8.9*  HCT 31.0* 29.0* 26.8*  PLT 162 156 136*  MCV 104.4* 104.6* 101.8*  MCH 35.2* 35.4* 33.9  MCHC 33.7  33.8 33.3  RDW 15.0* 15.0* 15.0*  LYMPHSABS  --   --  2.0  MONOABS  --   --  0.4  EOSABS  --   --  0.3  BASOSABS  --   --  0.1    Chemistries   Recent Labs Lab 10/29/16 1831 10/30/16 0224 11/01/16 1222 11/03/16 0357 11/05/16 0519  NA 141 143 142 143 140  K 3.7 3.7 4.0 3.6 3.1*  CL 111 118* 113* 114* 104  CO2 25 21* 26 25 30   GLUCOSE 78 74 84 79 86  BUN 17 15 16 15 12   CREATININE 0.61 0.52 0.55 0.51 0.79  CALCIUM 8.1* 7.8* 7.6* 7.4* 7.4*  MG  --   --   --  1.9 1.7  AST 35 33 34 40  --   ALT 47 42 38 42  --   ALKPHOS 150* 139* 138* 146*  --   BILITOT 0.9 0.6 0.6 0.7  --    ------------------------------------------------------------------------------------------------------------------ estimated creatinine clearance is 92.6 mL/min (by C-G formula based on SCr of 0.79 mg/dL). ------------------------------------------------------------------------------------------------------------------ No results for input(s): HGBA1C in the last 72 hours. ------------------------------------------------------------------------------------------------------------------ No results for input(s): CHOL, HDL, LDLCALC, TRIG, CHOLHDL, LDLDIRECT in the last 72 hours. ------------------------------------------------------------------------------------------------------------------ No results for input(s): TSH, T4TOTAL, T3FREE, THYROIDAB in the last 72 hours.  Invalid input(s):  FREET3 ------------------------------------------------------------------------------------------------------------------ No results for input(s): VITAMINB12, FOLATE, FERRITIN, TIBC, IRON, RETICCTPCT in the last 72 hours.  Coagulation profile  Recent Labs Lab 10/29/16 1831 10/30/16 0224  INR 1.04 1.05    No results for input(s): DDIMER in the last 72 hours.  Cardiac Enzymes No results for input(s): CKMB, TROPONINI, MYOGLOBIN in the last 168 hours.  Invalid input(s): CK ------------------------------------------------------------------------------------------------------------------ Invalid input(s): POCBNP  Assessment & Plan   This is a 42 y.o. female with a history of anxiety, hypertension, neuromuscular disorder, hypothyroidism,GERD, IBS, chronic lower extremity edema with stasis dermatitis osteoporosis and AVN of the right hip now being admitted with:  # RUE edema  venous doppler - No DVT. Improved  # Acute encephalopathy due to elevated ammonia On Rifaximin and lactulose No liver problems. Recent ultrasound showed no cirrhosis. 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 Ammonia continues to be elevated. Her mental status has improved. We'll reduce dose of lactulose. Needs GI follow-up as outpatient.  # Anasarca due to  hypoalbuminemia.  On increased dose of lasix 40mg  BID Improving  #. Weakness, gait instability. -  Seen by PT. skill nursing facility recommended. - Waiting for insurance authorization  #. History of AVN of the hip - Pain control  #. Anemia, chronic and stable - Monitor CBC  #. Severe protein calorie malnutrition - Added ensure today 3 times a day between meals.  #. History of hypothyroidism - Continue Synthroid  #. History of GERD - Continue Protonix     Code Status Orders        Start     Ordered   10/30/16 0200  Full code  Continuous     10/30/16 0159    Code Status History    Date Active Date Inactive Code Status Order  ID Comments User Context   10/22/2016  6:25 PM 10/26/2016  4:00 PM Full Code [id number]  Pieter Partridge, MD Inpatient   10/22/2016 12:27 PM 10/22/2016  6:25 PM Full Code [id number]  Melene Plan, DO ED   10/14/2016  9:19 PM 10/16/2016  3:42 PM Full Code [id number]  Katharina Caper, MD Inpatient   09/27/2016  6:11 PM  09/29/2016  9:00 PM Full Code [id number]  Altamese Dilling, MD Inpatient   06/01/2016  6:34 PM 06/03/2016 10:42 PM Full Code [id number]  Russella Dar, NP Inpatient   02/11/2016  2:22 AM 02/11/2016 10:15 PM Full Code [id number]  Eduard Clos, MD Inpatient     Consults  none  DVT Prophylaxis  Lovenox   Lab Results  Component Value Date   PLT 136 (L) 11/03/2016    Time  Spent in minutes  35 min   Milagros Loll R M.D on 11/05/2016 at 11:35 AM  Between 7am to 6pm - Pager - [phone]  After 6pm go to [email or web address] - password [email or web address] [facility]  [facility] [facility]   Office  [phone]

## 2016-11-06 LAB — CBC
HEMATOCRIT: 29 % — AB (ref 35.0–47.0)
HEMOGLOBIN: 9.9 g/dL — AB (ref 12.0–16.0)
MCH: 34 pg (ref 26.0–34.0)
MCHC: 34.2 g/dL (ref 32.0–36.0)
MCV: 99.5 fL (ref 80.0–100.0)
Platelets: 116 10*3/uL — ABNORMAL LOW (ref 150–440)
RBC: 2.92 MIL/uL — ABNORMAL LOW (ref 3.80–5.20)
RDW: 14.6 % — ABNORMAL HIGH (ref 11.5–14.5)
WBC: 5.2 10*3/uL (ref 3.6–11.0)

## 2016-11-06 LAB — ANTI-SMOOTH MUSCLE ANTIBODY, IGG: F-ACTIN AB IGG: 6 U (ref 0–19)

## 2016-11-06 MED ORDER — UNJURY CHICKEN SOUP POWDER: 8.0000 [oz_av] | Freq: Two times a day (BID) | ORAL | Status: DC

## 2016-11-06 MED ORDER — PREMIER PROTEIN SHAKE
11.0000 [oz_av] | ORAL | Status: DC
  Administered 2016-11-07: 10:00:00 11 [oz_av] via ORAL

## 2016-11-06 NOTE — Progress Notes (Signed)
[employer] - [place] at [facility]                                                                                                                                                                                 Patient Demographics   Dorothy Dyer, is a 41 y.o. female, DOB - 1975-07-31, ZOX:[id number]  Admit date - 10/29/2016   Admitting Physician Tonye Royalty, DO  Outpatient Primary MD for the patient is Patient, No Pcp Per   LOS - 5  Subjective:  Chronic abdominal pain is the same. Continues to feel weak.  Review of Systems:   Cannot obtain due to confusion  Vitals:   Vitals:   11/05/16 1410 11/05/16 2010 11/06/16 0434 11/06/16 0500  BP: 102/69 117/76 (!) 95/55   Pulse: 96 96 80   Resp: 20 20 18    Temp: 97.8 F (36.6 C) 98.1 F (36.7 C) (!) 97.3 F (36.3 C)   TempSrc:  Oral Axillary   SpO2: 100% 96% 100%   Weight:    84.5 kg (186 lb 3.2 oz)  Height:        Wt Readings from Last 3 Encounters:  11/06/16 84.5 kg (186 lb 3.2 oz)  10/26/16 94.2 kg (207 lb 10.8 oz)  10/15/16 82.1 kg (181 lb)    No intake or output data in the 24 hours ending 11/06/16 0959  Physical Exam:   GENERAL:no apparent distress.  HEAD, EYES, EARS, NOSE AND THROAT: Atraumatic, normocephalic. Extraocular muscles are intact. Pupils equal and reactive to light. Sclerae anicteric. No conjunctival injection. No oro-pharyngeal erythema.  NECK: Supple. There is no jugular venous distention. No bruits, no lymphadenopathy, no thyromegaly.  HEART: Regular rate and rhythm,. No murmurs, no rubs, no clicks.  LUNGS: Clear to auscultation bilaterally. No rales or rhonchi. No wheezes.  ABDOMEN: Soft, flat, nontender, nondistended. Has good bowel sounds. No hepatosplenomegaly appreciated.  EXTREMITIES: Bilateral lower extremity edema with Unna boots in place NEUROLOGIC: The patient is alert, awake, and oriented x3 with no focal motor or sensory deficits appreciated bilaterally.  SKIN:  Moist and warm with no rashes appreciated.  Psych: Awake and alert LN: No inguinal LN enlargement Improving anasarca    Antibiotics   Anti-infectives    Start     Dose/Rate Route Frequency Ordered Stop   11/03/16 0000  rifaximin (XIFAXAN) 550 MG TABS tablet     550 mg Oral 2 times daily 11/03/16 1127     11/01/16 1530  rifaximin (XIFAXAN) tablet 550 mg     550 mg Oral 2 times daily 11/01/16 1419     10/30/16 0159  doxycycline (VIBRA-TABS) tablet 100 mg  Status:  Discontinued  100 mg Oral Every 12 hours 10/30/16 0159 10/30/16 1328      Medications   Scheduled Meds: . enoxaparin (LOVENOX) injection  40 mg Subcutaneous Q24H  . furosemide  40 mg Oral BID  . lactulose  30 g Oral BID  . levothyroxine  125 mcg Oral QODAY  . levothyroxine  137 mcg Oral QODAY  . multivitamin with minerals  1 tablet Oral BID  . naproxen  500 mg Oral BID WC  . nortriptyline  75 mg Oral QHS  . omega-3 acid ethyl esters  1 g Oral Daily  . pantoprazole  40 mg Oral Daily  . potassium chloride SA  20 mEq Oral Daily  . pregabalin  225 mg Oral BID  . protein supplement shake  11 oz Oral TID BM  . rifaximin  550 mg Oral BID  . sodium chloride flush  3 mL Intravenous Q12H   Continuous Infusions: . sodium chloride     PRN Meds:.sodium chloride, albuterol, ipratropium, magnesium citrate, ondansetron **OR** ondansetron (ZOFRAN) IV, oxyCODONE-acetaminophen, simethicone, sodium chloride flush, traMADol   Data Review:   Micro Results No results found for this or any previous visit (from the past 240 hour(s)).  Radiology Reports Dg Chest 1 View  Result Date: 10/14/2016 CLINICAL DATA:  Recent discharge from hospital, now unable to walk or stand independently. pt is having weakness and bilat lower extremity pain and swelling. Pt also has skin sloughing with bilat ulcerated areas to her lower legs and feet. EXAM: CHEST 1 VIEW COMPARISON:  Chest x-ray dated 09/27/2016. FINDINGS: Study is hypoinspiratory with  crowding of the perihilar bronchovascular markings. Given the low lung volumes, lungs appear clear. No pleural effusion or pneumothorax seen. Heart size and mediastinal contours are normal. No acute or suspicious osseous finding. IMPRESSION: Low lung volumes. No active disease. No evidence of pneumonia or pulmonary edema. Electronically Signed   By: Bary Richard M.D.   On: 10/14/2016 17:32   Ct Humerus Right W Contrast  Result Date: 10/24/2016 CLINICAL DATA:  Right arm swelling and erythema with contracture of the right hand in the setting of a right clavicular fracture and right radial nerve palsy. Concern for infection or compartment syndrome. EXAM: CT OF THE UPPER RIGHT EXTREMITY WITH CONTRAST TECHNIQUE: Multidetector CT imaging of the right humerus and forearm was performed according to the standard protocol following intravenous contrast administration. COMPARISON:  Right humerus x-rays dated August 23, 2016. CONTRAST:  ISOVUE-300 IOPAMIDOL (ISOVUE-300) INJECTION 61% FINDINGS: Bones/Joint/Cartilage There is a subacute, comminuted fracture of the midclavicle with approximately 1 shaft bone width posterior displacement of the distal fragment. There is surrounding callus formation. No additional fractures.  No glenohumeral or elbow joint effusion. Ligaments Suboptimally assessed by CT. Muscles and Tendons Calcification adjacent to the posterior greater tuberosity in the region of the distal infraspinatus tendon. Soft tissues Diffuse anasarca in the visualized soft tissues. No drainable fluid collection. There is mild mass effect on the right subclavian vein as it passes behind the healing clavicle fracture. The vein appears patent. No axillary lymphadenopathy.  The visualized right lung is clear. IMPRESSION: 1. Diffuse anasarca in the visualized soft tissues, including the right arm and right chest and abdominal wall. No discrete drainable fluid collection is seen. Please note that the diagnosis of  compartment syndrome cannot be established by imaging. 2. Subacute, healing comminuted fracture of the mid clavicle with approximately 1 shaft bone width posterior displacement of the distal fragment. 3. Mild mass effect on the right subclavian vein  as it passes behind the healing clavicle fracture. The vein appears patent. If there is clinical concern for DVT, recommend further evaluation with ultrasound. 4. Probable calcific tendinitis of the infraspinatus tendon. Electronically Signed   By: Obie Dredge M.D.   On: 10/24/2016 08:25   Ct Forearm Right W Contrast  Result Date: 10/24/2016 CLINICAL DATA:  Right arm swelling and erythema with contracture of the right hand in the setting of a right clavicular fracture and right radial nerve palsy. Concern for infection or compartment syndrome. EXAM: CT OF THE UPPER RIGHT EXTREMITY WITH CONTRAST TECHNIQUE: Multidetector CT imaging of the right humerus and forearm was performed according to the standard protocol following intravenous contrast administration. COMPARISON:  Right humerus x-rays dated August 23, 2016. CONTRAST:  ISOVUE-300 IOPAMIDOL (ISOVUE-300) INJECTION 61% FINDINGS: Bones/Joint/Cartilage There is a subacute, comminuted fracture of the midclavicle with approximately 1 shaft bone width posterior displacement of the distal fragment. There is surrounding callus formation. No additional fractures.  No glenohumeral or elbow joint effusion. Ligaments Suboptimally assessed by CT. Muscles and Tendons Calcification adjacent to the posterior greater tuberosity in the region of the distal infraspinatus tendon. Soft tissues Diffuse anasarca in the visualized soft tissues. No drainable fluid collection. There is mild mass effect on the right subclavian vein as it passes behind the healing clavicle fracture. The vein appears patent. No axillary lymphadenopathy.  The visualized right lung is clear. IMPRESSION: 1. Diffuse anasarca in the visualized soft tissues,  including the right arm and right chest and abdominal wall. No discrete drainable fluid collection is seen. Please note that the diagnosis of compartment syndrome cannot be established by imaging. 2. Subacute, healing comminuted fracture of the mid clavicle with approximately 1 shaft bone width posterior displacement of the distal fragment. 3. Mild mass effect on the right subclavian vein as it passes behind the healing clavicle fracture. The vein appears patent. If there is clinical concern for DVT, recommend further evaluation with ultrasound. 4. Probable calcific tendinitis of the infraspinatus tendon. Electronically Signed   By: Obie Dredge M.D.   On: 10/24/2016 08:25   US Venous Img Lower Bilateral  Result Date: 10/15/2016 CLINICAL DATA:  Leg swelling. EXAM: BILATERAL LOWER EXTREMITY VENOUS DOPPLER ULTRASOUND TECHNIQUE: Gray-scale sonography with graded compression, as well as color Doppler and duplex ultrasound were performed to evaluate the lower extremity deep venous systems from the level of the common femoral vein and including the common femoral, femoral, profunda femoral, popliteal and calf veins including the posterior tibial, peroneal and gastrocnemius veins when visible. The superficial great saphenous vein was also interrogated. Spectral Doppler was utilized to evaluate flow at rest and with distal augmentation maneuvers in the common femoral, femoral and popliteal veins. COMPARISON:  Knee MRI 06/01/2016 FINDINGS: RIGHT LOWER EXTREMITY Common Femoral Vein: No evidence of thrombus. Normal compressibility, respiratory phasicity and response to augmentation. Saphenofemoral Junction: No evidence of thrombus. Normal compressibility and flow on color Doppler imaging. Profunda Femoral Vein: No evidence of thrombus. Normal compressibility and flow on color Doppler imaging. Femoral Vein: No evidence of thrombus. Normal compressibility, respiratory phasicity and response to augmentation. Popliteal Vein:  No evidence of thrombus. Normal compressibility, respiratory phasicity and response to augmentation. Calf Veins: No evidence of thrombus. Normal compressibility and flow on color Doppler imaging. Superficial Great Saphenous Vein: No evidence of thrombus. Normal compressibility and flow on color Doppler imaging. Other Findings:  Subcutaneous edema. LEFT LOWER EXTREMITY Common Femoral Vein: No evidence of thrombus. Normal compressibility, respiratory phasicity and response to  augmentation. Saphenofemoral Junction: No evidence of thrombus. Normal compressibility and flow on color Doppler imaging. Profunda Femoral Vein: No evidence of thrombus. Normal compressibility and flow on color Doppler imaging. Femoral Vein: No evidence of thrombus. Normal compressibility, respiratory phasicity and response to augmentation. Popliteal Vein: No evidence of thrombus. Normal compressibility, respiratory phasicity and response to augmentation. Calf Veins: No evidence of thrombus. Normal compressibility and flow on color Doppler imaging. Superficial Great Saphenous Vein: No evidence of thrombus. Normal compressibility and flow on color Doppler imaging. Other Findings:  Subcutaneous edema. IMPRESSION: No evidence of DVT within either lower extremity. Bilateral subcutaneous edema. Electronically Signed   By: Genevive Bi M.D.   On: 10/15/2016 10:55   US Venous Img Upper Uni Right  Result Date: 11/03/2016 CLINICAL DATA:  EDEMA OF THE RIGHT ARM. EXAM: RIGHT UPPER EXTREMITY VENOUS DOPPLER ULTRASOUND TECHNIQUE: Gray-scale sonography with graded compression, as well as color Doppler and duplex ultrasound were performed to evaluate the upper extremity deep venous system from the level of the subclavian vein and including the jugular, axillary, basilic, radial, ulnar and upper cephalic vein. Spectral Doppler was utilized to evaluate flow at rest and with distal augmentation maneuvers. COMPARISON:  None. FINDINGS: Contralateral  Subclavian Vein: Respiratory phasicity is normal and symmetric with the symptomatic side. No evidence of thrombus. Normal compressibility. Internal Jugular Vein: No evidence of thrombus. Normal compressibility, respiratory phasicity and response to augmentation. Subclavian Vein: No evidence of thrombus. Normal compressibility, respiratory phasicity and response to augmentation. Axillary Vein: No evidence of thrombus. Normal compressibility, respiratory phasicity and response to augmentation. Cephalic Vein: No evidence of thrombus. Normal compressibility, respiratory phasicity and response to augmentation. Basilic Vein: Limited visualization due to edema. No evidence of thrombus. Brachial Veins: No evidence of thrombus. Normal compressibility, respiratory phasicity and response to augmentation. Radial Veins: No evidence of thrombus. Normal compressibility, respiratory phasicity and response to augmentation. Ulnar Veins: No evidence of thrombus. Normal compressibility, respiratory phasicity and response to augmentation. Venous Reflux:  None visualized. Other Findings:  None visualized. IMPRESSION: No evidence of DVT within the right upper extremity. Electronically Signed   By: Francene Boyers M.D.   On: 11/03/2016 15:27   US Abdomen Limited Ruq  Result Date: 10/24/2016 CLINICAL DATA:  Right upper quadrant pain. EXAM: ULTRASOUND ABDOMEN LIMITED RIGHT UPPER QUADRANT COMPARISON:  MRI 06/01/2016.  CT 06/18/2014 . FINDINGS: Gallbladder: Cholecystectomy. Common bile duct: Diameter: 6.7 mm Liver: Increased echogenicity of the liver noted consistent fatty infiltration and/or hepatocellular disease. A 1.1 cm simple cyst in the left hepatic lobe. Questionable very mild ascites. Tiny right pleural effusion. IMPRESSION: 1. Cholecystectomy.  No biliary distention. 2. Increased hepatic echogenicity consistent fatty infiltration and/or hepatocellular disease. Questions mild ascites. Small right pleural effusion. Electronically  Signed   By: Maisie Fus  Register   On: 10/24/2016 10:05   CBC  Recent Labs Lab 11/03/16 0357 11/06/16 0345  WBC 4.6 5.2  HGB 8.9* 9.9*  HCT 26.8* 29.0*  PLT 136* 116*  MCV 101.8* 99.5  MCH 33.9 34.0  MCHC 33.3 34.2  RDW 15.0* 14.6*  LYMPHSABS 2.0  --   MONOABS 0.4  --   EOSABS 0.3  --   BASOSABS 0.1  --     Chemistries   Recent Labs Lab 11/01/16 1222 11/03/16 0357 11/05/16 0519  NA 142 143 140  K 4.0 3.6 3.1*  CL 113* 114* 104  CO2 26 25 30   GLUCOSE 84 79 86  BUN 16 15 12   CREATININE 0.55 0.51 0.79  CALCIUM  7.6* 7.4* 7.4*  MG  --  1.9 1.7  AST 34 40  --   ALT 38 42  --   ALKPHOS 138* 146*  --   BILITOT 0.6 0.7  --    ------------------------------------------------------------------------------------------------------------------ estimated creatinine clearance is 91.4 mL/min (by C-G formula based on SCr of 0.79 mg/dL). ------------------------------------------------------------------------------------------------------------------ No results for input(s): HGBA1C in the last 72 hours. ------------------------------------------------------------------------------------------------------------------ No results for input(s): CHOL, HDL, LDLCALC, TRIG, CHOLHDL, LDLDIRECT in the last 72 hours. ------------------------------------------------------------------------------------------------------------------ No results for input(s): TSH, T4TOTAL, T3FREE, THYROIDAB in the last 72 hours.  Invalid input(s): FREET3 ------------------------------------------------------------------------------------------------------------------ No results for input(s): VITAMINB12, FOLATE, FERRITIN, TIBC, IRON, RETICCTPCT in the last 72 hours.  Coagulation profile No results for input(s): INR, PROTIME in the last 168 hours.  No results for input(s): DDIMER in the last 72 hours.  Cardiac Enzymes No results for input(s): CKMB, TROPONINI, MYOGLOBIN in the last 168 hours.  Invalid  input(s): CK ------------------------------------------------------------------------------------------------------------------ Invalid input(s): POCBNP  Assessment & Plan   This is a 41 y.o. female with a history of anxiety, hypertension, neuromuscular disorder, hypothyroidism,GERD, IBS, chronic lower extremity edema with stasis dermatitis osteoporosis and AVN of the right hip now being admitted with:  # RUE edema  venous doppler - No DVT. Improved  # Acute encephalopathy due to elevated ammonia On Rifaximin and lactulose No liver problems. Recent ultrasound showed no cirrhosis. 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 Ammonia continues to be elevated. Her mental status has improved.  On lactulose twice a day Needs GI follow-up as outpatient.  # Anasarca due to  hypoalbuminemia.  On  lasix 40mg  BID Improving  #. Weakness, gait instability. -  Seen by PT. skill nursing facility recommended. - Waiting for insurance authorization  #. History of AVN of the hip - Pain control  #. Anemia, chronic and stable  #. Severe protein calorie malnutrition -  ensure today 3 times a day between meals.  #. History of hypothyroidism - Continue Synthroid  #. History of GERD - Continue Protonix     Code Status Orders        Start     Ordered   10/30/16 0200  Full code  Continuous     10/30/16 0159    Code Status History    Date Active Date Inactive Code Status Order ID Comments User Context   10/22/2016  6:25 PM 10/26/2016  4:00 PM Full Code [id number]  Pieter Partridge, MD Inpatient   10/22/2016 12:27 PM 10/22/2016  6:25 PM Full Code [id number]  Melene Plan, DO ED   10/14/2016  9:19 PM 10/16/2016  3:42 PM Full Code [id number]  Katharina Caper, MD Inpatient   09/27/2016  6:11 PM 09/29/2016  9:00 PM Full Code [id number]  Altamese Dilling, MD Inpatient   06/01/2016  6:34 PM 06/03/2016 10:42 PM Full Code [id number]  Russella Dar, NP Inpatient   02/11/2016  2:22 AM 02/11/2016  10:15 PM Full Code [id number]  Eduard Clos, MD Inpatient     Consults  none  DVT Prophylaxis  Lovenox   Lab Results  Component Value Date   PLT 116 (L) 11/06/2016    Time  Spent in minutes  25 min   Milagros Loll R M.D on 11/06/2016 at 9:59 AM  Between 7am to 6pm - Pager - [phone]  After 6pm go to [email or web address] - password [email or web address] [facility]  [facility]   Office  [phone]

## 2016-11-06 NOTE — Care Management Important Message (Signed)
Important Message  Patient Details  Name: Dorothy Dyer MRN: [id number] Date of Birth: April 24, 1975   Medicare Important Message Given:  Yes    Gwenette Greet, RN 11/06/2016, 8:21 AM

## 2016-11-06 NOTE — Clinical Social Work Note (Addendum)
CSW attempted to contact [facility] to get an update on insurance authorization awaiting call back from SNF.  CSW to continue to follow patient's progress throughout discharge planning.  1:35pm  CSW received phone call back from [facility] they have received insurance authorization, however they can not accept patient today, but they can accept him tomorrow.  CSW to continue to follow patient's progress.  Dorothy Dyer. Dorothy Dyer, MSW, [facility] [phone]  11/06/2016 1:16 PM

## 2016-11-06 NOTE — Progress Notes (Signed)
Initial Nutrition Assessment  DOCUMENTATION CODES:   Severe malnutrition in context of chronic illness, Obesity unspecified  INTERVENTION:  Will decrease Premier Protein to po once daily, each supplement provides 160 kcal and 30 grams of protein.  Provide [place] Chicken Soup po BID, each supplement provides 100 kcal and 21 grams of protein.  Provide Carnation Instant Breakfast in 2% milk po once daily with breakfast, each supplement provides 252 kcal, 13 grams of protein.  Continue MVI BID until 9/4. After 9/4 recommend switching to once daily.  NUTRITION DIAGNOSIS:   Malnutrition (Severe) related to chronic illness (hx Roux-en-Y gastric bypass in 2003) as evidenced by energy intake < or equal to 75% for > or equal to 1 month, moderate depletion of body fat, moderate depletions of muscle mass, moderate to severe fluid accumulation.  Ongoing.  GOAL:   Patient will meet greater than or equal to 90% of their needs  Not met.  MONITOR:   PO intake, Supplement acceptance, Labs, Weight trends, Skin, I & O's  REASON FOR ASSESSMENT:   Consult Assessment of nutrition requirement/status  ASSESSMENT:   41 year old female with PMHx of anxiety, hypothyroidism, anemia, anxiety, arthritis, acid reflux, HTN, osteoporosis, hx of Roux-en-Y gastric bypass in 2003, recent diagnosis of clavicular fracture, avascular necrosis of hip, chronic lower extremity edema with stasis dermatitis, who presents for evaluation of weakness and fall.   -Plan is for discharge to [facility] pending insurance authorization.  Spoke with patient at bedside. She was eating her breakfast at time of assessment. She had eaten approximately 25% of her eggs with cheese and 50% of her toast with margarine. She reports this has been her first good meal in a few days because the lactulose is making her feel sick. She reports nausea and diarrhea over the past few days. However, bowel movements recorded in chart have been  type 5-6 on [place] stool chart, so patient is not currently having diarrhea. She is only drinking about 50% of one bottle of Premier Protein daily. She reports it is thick and that is all she can drink. She requested to try [place]. She is also amenable to trying CIB.  Meal completion not recorded in chart.  Medications reviewed and include: Lasix 40 mg BID, lactulose 30 grams BID, levothyroxine, MVI BID, Lovaza 1 gram daily, pantoprazole, potassium chloride 20 mEq daily.  Labs reviewed: On 8/26 Potassium 3.1, Ammonia 117 (trending up).  Weight trend: 84.5 kg on 8/27 (-4 kg from admission)  Discussed with RN.  Diet Order:  Diet regular Room service appropriate? Yes; Fluid consistency: Thin  Skin:  Wound (see comment) (non-pressure wounds to right buttocks)  Last BM:  11/06/2016 - type 5  Height:   Ht Readings from Last 1 Encounters:  11/02/16 5' 1.5" (1.562 m)    Weight:   Wt Readings from Last 1 Encounters:  11/06/16 186 lb 3.2 oz (84.5 kg)    Ideal Body Weight:  47.7 kg  BMI:  Body mass index is 34.61 kg/m.  Estimated Nutritional Needs:   Kcal:  1810-2090 (MSJ x 1.3-1.5)  Protein:  103-120 grams (1.3-1.5 grams/kg)  Fluid:  1.8-2 L/day (1 ml/kcal)  EDUCATION NEEDS:   No education needs identified at this time  Willey Blade, MS, RD, LDN Pager: [phone] After Hours Pager: [phone]

## 2016-11-07 DIAGNOSIS — Z9884 Bariatric surgery status: Secondary | ICD-10-CM | POA: Diagnosis not present

## 2016-11-07 DIAGNOSIS — Z66 Do not resuscitate: Secondary | ICD-10-CM | POA: Diagnosis present

## 2016-11-07 DIAGNOSIS — B964 Proteus (mirabilis) (morganii) as the cause of diseases classified elsewhere: Secondary | ICD-10-CM | POA: Diagnosis present

## 2016-11-07 DIAGNOSIS — E43 Unspecified severe protein-calorie malnutrition: Secondary | ICD-10-CM | POA: Diagnosis not present

## 2016-11-07 DIAGNOSIS — K7040 Alcoholic hepatic failure without coma: Secondary | ICD-10-CM | POA: Diagnosis present

## 2016-11-07 DIAGNOSIS — Z6834 Body mass index (BMI) 34.0-34.9, adult: Secondary | ICD-10-CM | POA: Diagnosis not present

## 2016-11-07 DIAGNOSIS — Z7189 Other specified counseling: Secondary | ICD-10-CM | POA: Diagnosis not present

## 2016-11-07 DIAGNOSIS — R402441 Other coma, without documented Glasgow coma scale score, or with partial score reported, in the field [EMT or ambulance]: Secondary | ICD-10-CM | POA: Diagnosis not present

## 2016-11-07 DIAGNOSIS — R69 Illness, unspecified: Secondary | ICD-10-CM | POA: Diagnosis not present

## 2016-11-07 DIAGNOSIS — K7581 Nonalcoholic steatohepatitis (NASH): Secondary | ICD-10-CM | POA: Diagnosis present

## 2016-11-07 DIAGNOSIS — E7220 Disorder of urea cycle metabolism, unspecified: Secondary | ICD-10-CM | POA: Diagnosis present

## 2016-11-07 DIAGNOSIS — G9340 Encephalopathy, unspecified: Secondary | ICD-10-CM | POA: Diagnosis not present

## 2016-11-07 DIAGNOSIS — Z4682 Encounter for fitting and adjustment of non-vascular catheter: Secondary | ICD-10-CM | POA: Diagnosis not present

## 2016-11-07 DIAGNOSIS — Z818 Family history of other mental and behavioral disorders: Secondary | ICD-10-CM | POA: Diagnosis not present

## 2016-11-07 DIAGNOSIS — E039 Hypothyroidism, unspecified: Secondary | ICD-10-CM | POA: Diagnosis present

## 2016-11-07 DIAGNOSIS — I10 Essential (primary) hypertension: Secondary | ICD-10-CM | POA: Diagnosis present

## 2016-11-07 DIAGNOSIS — R531 Weakness: Secondary | ICD-10-CM | POA: Diagnosis not present

## 2016-11-07 DIAGNOSIS — K7200 Acute and subacute hepatic failure without coma: Secondary | ICD-10-CM | POA: Diagnosis not present

## 2016-11-07 DIAGNOSIS — R296 Repeated falls: Secondary | ICD-10-CM | POA: Diagnosis present

## 2016-11-07 DIAGNOSIS — Z5189 Encounter for other specified aftercare: Secondary | ICD-10-CM | POA: Diagnosis not present

## 2016-11-07 DIAGNOSIS — M6281 Muscle weakness (generalized): Secondary | ICD-10-CM | POA: Diagnosis not present

## 2016-11-07 DIAGNOSIS — Z8249 Family history of ischemic heart disease and other diseases of the circulatory system: Secondary | ICD-10-CM | POA: Diagnosis not present

## 2016-11-07 DIAGNOSIS — K7290 Hepatic failure, unspecified without coma: Secondary | ICD-10-CM | POA: Diagnosis not present

## 2016-11-07 DIAGNOSIS — R7881 Bacteremia: Secondary | ICD-10-CM | POA: Diagnosis present

## 2016-11-07 DIAGNOSIS — Z789 Other specified health status: Secondary | ICD-10-CM | POA: Diagnosis not present

## 2016-11-07 DIAGNOSIS — Z915 Personal history of self-harm: Secondary | ICD-10-CM | POA: Diagnosis not present

## 2016-11-07 DIAGNOSIS — R278 Other lack of coordination: Secondary | ICD-10-CM | POA: Diagnosis not present

## 2016-11-07 DIAGNOSIS — R41841 Cognitive communication deficit: Secondary | ICD-10-CM | POA: Diagnosis not present

## 2016-11-07 DIAGNOSIS — K219 Gastro-esophageal reflux disease without esophagitis: Secondary | ICD-10-CM | POA: Diagnosis present

## 2016-11-07 DIAGNOSIS — Z515 Encounter for palliative care: Secondary | ICD-10-CM | POA: Diagnosis not present

## 2016-11-07 DIAGNOSIS — I872 Venous insufficiency (chronic) (peripheral): Secondary | ICD-10-CM | POA: Diagnosis present

## 2016-11-07 DIAGNOSIS — R6889 Other general symptoms and signs: Secondary | ICD-10-CM | POA: Diagnosis not present

## 2016-11-07 DIAGNOSIS — R488 Other symbolic dysfunctions: Secondary | ICD-10-CM | POA: Diagnosis not present

## 2016-11-07 DIAGNOSIS — R601 Generalized edema: Secondary | ICD-10-CM | POA: Diagnosis not present

## 2016-11-07 DIAGNOSIS — R569 Unspecified convulsions: Secondary | ICD-10-CM | POA: Diagnosis present

## 2016-11-07 DIAGNOSIS — D696 Thrombocytopenia, unspecified: Secondary | ICD-10-CM | POA: Diagnosis present

## 2016-11-07 DIAGNOSIS — R2681 Unsteadiness on feet: Secondary | ICD-10-CM | POA: Diagnosis not present

## 2016-11-07 DIAGNOSIS — K7030 Alcoholic cirrhosis of liver without ascites: Secondary | ICD-10-CM | POA: Diagnosis present

## 2016-11-07 DIAGNOSIS — M8700 Idiopathic aseptic necrosis of unspecified bone: Secondary | ICD-10-CM | POA: Diagnosis not present

## 2016-11-07 DIAGNOSIS — Z743 Need for continuous supervision: Secondary | ICD-10-CM | POA: Diagnosis not present

## 2016-11-07 DIAGNOSIS — X58XXXD Exposure to other specified factors, subsequent encounter: Secondary | ICD-10-CM | POA: Diagnosis present

## 2016-11-07 DIAGNOSIS — D509 Iron deficiency anemia, unspecified: Secondary | ICD-10-CM | POA: Diagnosis present

## 2016-11-07 DIAGNOSIS — M810 Age-related osteoporosis without current pathological fracture: Secondary | ICD-10-CM | POA: Diagnosis present

## 2016-11-07 DIAGNOSIS — L309 Dermatitis, unspecified: Secondary | ICD-10-CM | POA: Diagnosis present

## 2016-11-07 MED ORDER — MIRTAZAPINE 15 MG PO TABS: 15.0000 mg | ORAL_TABLET | Freq: Every day | ORAL | 2 refills | Status: AC

## 2016-11-07 NOTE — Clinical Social Work Note (Signed)
Patient to be d/c'ed today to [facility].  Patient and family agreeable to plans will transport via ems RN to call report to [phone].  Windell Moulding, MSW, [facility] [phone]

## 2016-11-07 NOTE — Progress Notes (Signed)
Physical Therapy Treatment Patient Details Name: Dorothy Dyer MRN: [id number] DOB: August 24, 1975 Today's Date: 11/07/2016    History of Present Illness 41 y.o. female with a known history of anxiety, hypertension, neuromuscular disorder, hypothyroidism, GERD, IBS, chronic lower extremity edema with stasis dermatitis osteoporosis and AVN of the right hip presents to the emergency department for evaluation of weakness and fall.  Patient was in a usual state of health until she slowly fell to the floor while using her walker and landed on her left side. She described generalized weakness that prohibited her from getting back to standing position. She was on the floor for about five hours. Denies head trauma or loss of consciousness. Patient lives with her son who has autism. Of note patient was hospitalized on 10/22/2016 for Tylenol overdose after taking an unknown quantity of Percocet as well as drinking alcohol and a possible suicide attempt.  At that time she was found to have acute hepatic and toxic encephalopathy, anasarca related to liver disease and severe protein malnutrition. she was discharged on 10/26/2016. She has had multiple hospital admissions for falls, fractures and opiate overdoses.    PT Comments    Participated in exercises as described below.  To edge of bed with min assist.  Able to sit x 10 minutes unsupported without LOB.  Returned to supine with mod assist.  Remains globally weak with limited mobility and activity tolerance.  Stated she has been up to commode but reports being very unsteady and needing assistance.  SNF remains appropriate for discharge.   Follow Up Recommendations  SNF     Equipment Recommendations  None recommended by PT    Recommendations for Other Services       Precautions / Restrictions Precautions Precautions: Fall Restrictions Weight Bearing Restrictions: No Other Position/Activity Restrictions: previous R UE NWBing, she apparently does not  follow this anymore    Mobility  Bed Mobility Overal bed mobility: Needs Assistance Bed Mobility: Supine to Sit;Sit to Supine     Supine to sit: Min assist Sit to supine: Mod assist   General bed mobility comments: Pt showed good effort getting on/off bed, did not need excessive cuing but still needing some assist  Transfers                    Ambulation/Gait                 Stairs            Wheelchair Mobility    Modified Rankin (Stroke Patients Only)       Balance Overall balance assessment: Needs assistance Sitting-balance support: Bilateral upper extremity supported Sitting balance-Leahy Scale: Fair                                      Cognition Arousal/Alertness: Lethargic Behavior During Therapy: WFL for tasks assessed/performed Overall Cognitive Status: Within Functional Limits for tasks assessed                                        Exercises General Exercises - Lower Extremity Ankle Circles/Pumps: AROM;Strengthening;Both;10 reps;Supine Quad Sets: Strengthening;Both;10 reps Short Arc Quad: Strengthening;Both;10 reps;Supine Long Arc Quad: Strengthening;Both;10 reps;Seated Heel Slides: Strengthening;Both;10 reps;Supine Hip ABduction/ADduction: Strengthening;Both;10 reps;Supine Straight Leg Raises: Strengthening;Both;10 reps;Supine    General Comments  Pertinent Vitals/Pain Pain Assessment: Faces Faces Pain Scale: Hurts little more Pain Location: L hip during therex Pain Intervention(s): Limited activity within patient's tolerance    Home Living                      Prior Function            PT Goals (current goals can now be found in the care plan section) Progress towards PT goals: Progressing toward goals    Frequency    Min 2X/week      PT Plan Current plan remains appropriate    Co-evaluation              AM-PAC PT "6 Clicks" Daily Activity  Outcome  Measure  Difficulty turning over in bed (including adjusting bedclothes, sheets and blankets)?: Unable Difficulty moving from lying on back to sitting on the side of the bed? : Unable Difficulty sitting down on and standing up from a chair with arms (e.g., wheelchair, bedside commode, etc,.)?: A Lot Help needed moving to and from a bed to chair (including a wheelchair)?: A Lot Help needed walking in hospital room?: Total Help needed climbing 3-5 steps with a railing? : Total 6 Click Score: 8    End of Session Equipment Utilized During Treatment: Gait belt Activity Tolerance: Patient tolerated treatment well Patient left: in bed;with call bell/phone within reach;with bed alarm set   Pain - Right/Left: Left Pain - part of body: Hip     Time: 6962-9528 PT Time Calculation (min) (ACUTE ONLY): 23 min  Charges:  $Therapeutic Exercise: 8-22 mins $Therapeutic Activity: 8-22 mins                    G Codes:      Danielle Dess, PTA 11/07/16, 10:07 AM

## 2016-11-07 NOTE — Progress Notes (Signed)
Pt is being discharged today to [facility]. Report called to [facility] RN [phone]. All belongings packed and returned to the patient. IV removed. EMS will be notified of the need for transport.

## 2016-11-07 NOTE — Progress Notes (Signed)
OT Cancellation Note  Patient Details Name: Dorothy Dyer MRN: [id number] DOB: 26-Feb-1976   Cancelled Treatment:    Reason Eval/Treat Not Completed: Other (comment). Upon attempt to treat, pt actively discharging from hospital and unavailable for treatment. Will re-attempt as appropriate should pt not discharge shortly.  Richrd Prime, MPH, MS, OTR/L ascom [phone] 11/07/16, 2:20 PM

## 2016-11-07 NOTE — Plan of Care (Signed)
Problem: Education: Goal: Knowledge of [facility] Education information/materials will improve Outcome: Progressing Pt resting comfortably throughout the shift. No complaints of pain or discomfort. Pt up to bedside commode with one assist as needed. Plan is to discharge to SNF later today.

## 2016-11-08 ENCOUNTER — Inpatient Hospital Stay (HOSPITAL_COMMUNITY)
Admission: EM | Admit: 2016-11-08 | Discharge: 2016-12-11 | DRG: 432 | Disposition: E | Payer: Medicare HMO | Attending: Internal Medicine | Admitting: Internal Medicine

## 2016-11-08 ENCOUNTER — Emergency Department (HOSPITAL_COMMUNITY): Payer: Medicare HMO

## 2016-11-08 ENCOUNTER — Encounter (HOSPITAL_COMMUNITY): Payer: Self-pay | Admitting: Nurse Practitioner

## 2016-11-08 DIAGNOSIS — Z8261 Family history of arthritis: Secondary | ICD-10-CM

## 2016-11-08 DIAGNOSIS — Z9119 Patient's noncompliance with other medical treatment and regimen: Secondary | ICD-10-CM

## 2016-11-08 DIAGNOSIS — R55 Syncope and collapse: Secondary | ICD-10-CM | POA: Diagnosis not present

## 2016-11-08 DIAGNOSIS — K7200 Acute and subacute hepatic failure without coma: Secondary | ICD-10-CM | POA: Diagnosis present

## 2016-11-08 DIAGNOSIS — I872 Venous insufficiency (chronic) (peripheral): Secondary | ICD-10-CM | POA: Diagnosis present

## 2016-11-08 DIAGNOSIS — F1010 Alcohol abuse, uncomplicated: Secondary | ICD-10-CM | POA: Diagnosis present

## 2016-11-08 DIAGNOSIS — Z825 Family history of asthma and other chronic lower respiratory diseases: Secondary | ICD-10-CM

## 2016-11-08 DIAGNOSIS — X58XXXD Exposure to other specified factors, subsequent encounter: Secondary | ICD-10-CM | POA: Diagnosis present

## 2016-11-08 DIAGNOSIS — Z6834 Body mass index (BMI) 34.0-34.9, adult: Secondary | ICD-10-CM

## 2016-11-08 DIAGNOSIS — K7682 Hepatic encephalopathy: Secondary | ICD-10-CM

## 2016-11-08 DIAGNOSIS — E43 Unspecified severe protein-calorie malnutrition: Secondary | ICD-10-CM | POA: Diagnosis present

## 2016-11-08 DIAGNOSIS — Z4659 Encounter for fitting and adjustment of other gastrointestinal appliance and device: Secondary | ICD-10-CM

## 2016-11-08 DIAGNOSIS — R32 Unspecified urinary incontinence: Secondary | ICD-10-CM | POA: Diagnosis present

## 2016-11-08 DIAGNOSIS — R569 Unspecified convulsions: Secondary | ICD-10-CM | POA: Diagnosis present

## 2016-11-08 DIAGNOSIS — Z7189 Other specified counseling: Secondary | ICD-10-CM

## 2016-11-08 DIAGNOSIS — R601 Generalized edema: Secondary | ICD-10-CM | POA: Diagnosis not present

## 2016-11-08 DIAGNOSIS — Z9884 Bariatric surgery status: Secondary | ICD-10-CM

## 2016-11-08 DIAGNOSIS — Z79891 Long term (current) use of opiate analgesic: Secondary | ICD-10-CM

## 2016-11-08 DIAGNOSIS — D509 Iron deficiency anemia, unspecified: Secondary | ICD-10-CM | POA: Diagnosis present

## 2016-11-08 DIAGNOSIS — Z515 Encounter for palliative care: Secondary | ICD-10-CM | POA: Diagnosis not present

## 2016-11-08 DIAGNOSIS — E039 Hypothyroidism, unspecified: Secondary | ICD-10-CM | POA: Diagnosis present

## 2016-11-08 DIAGNOSIS — F17210 Nicotine dependence, cigarettes, uncomplicated: Secondary | ICD-10-CM | POA: Diagnosis present

## 2016-11-08 DIAGNOSIS — F1110 Opioid abuse, uncomplicated: Secondary | ICD-10-CM | POA: Diagnosis present

## 2016-11-08 DIAGNOSIS — Z8349 Family history of other endocrine, nutritional and metabolic diseases: Secondary | ICD-10-CM

## 2016-11-08 DIAGNOSIS — K219 Gastro-esophageal reflux disease without esophagitis: Secondary | ICD-10-CM | POA: Diagnosis present

## 2016-11-08 DIAGNOSIS — K7040 Alcoholic hepatic failure without coma: Secondary | ICD-10-CM | POA: Diagnosis not present

## 2016-11-08 DIAGNOSIS — E7220 Disorder of urea cycle metabolism, unspecified: Secondary | ICD-10-CM | POA: Diagnosis present

## 2016-11-08 DIAGNOSIS — F419 Anxiety disorder, unspecified: Secondary | ICD-10-CM | POA: Diagnosis present

## 2016-11-08 DIAGNOSIS — R531 Weakness: Secondary | ICD-10-CM | POA: Diagnosis not present

## 2016-11-08 DIAGNOSIS — Z823 Family history of stroke: Secondary | ICD-10-CM

## 2016-11-08 DIAGNOSIS — K7290 Hepatic failure, unspecified without coma: Secondary | ICD-10-CM

## 2016-11-08 DIAGNOSIS — I10 Essential (primary) hypertension: Secondary | ICD-10-CM | POA: Diagnosis present

## 2016-11-08 DIAGNOSIS — R7881 Bacteremia: Secondary | ICD-10-CM | POA: Diagnosis present

## 2016-11-08 DIAGNOSIS — M810 Age-related osteoporosis without current pathological fracture: Secondary | ICD-10-CM | POA: Diagnosis present

## 2016-11-08 DIAGNOSIS — R402422 Glasgow coma scale score 9-12, at arrival to emergency department: Secondary | ICD-10-CM | POA: Diagnosis present

## 2016-11-08 DIAGNOSIS — K7581 Nonalcoholic steatohepatitis (NASH): Secondary | ICD-10-CM | POA: Diagnosis present

## 2016-11-08 DIAGNOSIS — Z79890 Hormone replacement therapy (postmenopausal): Secondary | ICD-10-CM

## 2016-11-08 DIAGNOSIS — Z66 Do not resuscitate: Secondary | ICD-10-CM | POA: Diagnosis present

## 2016-11-08 DIAGNOSIS — L309 Dermatitis, unspecified: Secondary | ICD-10-CM | POA: Diagnosis present

## 2016-11-08 DIAGNOSIS — Z833 Family history of diabetes mellitus: Secondary | ICD-10-CM

## 2016-11-08 DIAGNOSIS — R296 Repeated falls: Secondary | ICD-10-CM | POA: Diagnosis present

## 2016-11-08 DIAGNOSIS — D696 Thrombocytopenia, unspecified: Secondary | ICD-10-CM | POA: Diagnosis present

## 2016-11-08 DIAGNOSIS — Z818 Family history of other mental and behavioral disorders: Secondary | ICD-10-CM

## 2016-11-08 DIAGNOSIS — Z9049 Acquired absence of other specified parts of digestive tract: Secondary | ICD-10-CM

## 2016-11-08 DIAGNOSIS — M7989 Other specified soft tissue disorders: Secondary | ICD-10-CM | POA: Diagnosis present

## 2016-11-08 DIAGNOSIS — R2681 Unsteadiness on feet: Secondary | ICD-10-CM | POA: Diagnosis not present

## 2016-11-08 DIAGNOSIS — Z8249 Family history of ischemic heart disease and other diseases of the circulatory system: Secondary | ICD-10-CM

## 2016-11-08 DIAGNOSIS — M1990 Unspecified osteoarthritis, unspecified site: Secondary | ICD-10-CM | POA: Diagnosis present

## 2016-11-08 DIAGNOSIS — K7030 Alcoholic cirrhosis of liver without ascites: Secondary | ICD-10-CM | POA: Diagnosis present

## 2016-11-08 DIAGNOSIS — Z886 Allergy status to analgesic agent status: Secondary | ICD-10-CM

## 2016-11-08 DIAGNOSIS — S42001D Fracture of unspecified part of right clavicle, subsequent encounter for fracture with routine healing: Secondary | ICD-10-CM

## 2016-11-08 DIAGNOSIS — Z79899 Other long term (current) drug therapy: Secondary | ICD-10-CM

## 2016-11-08 DIAGNOSIS — L409 Psoriasis, unspecified: Secondary | ICD-10-CM | POA: Diagnosis present

## 2016-11-08 DIAGNOSIS — B964 Proteus (mirabilis) (morganii) as the cause of diseases classified elsewhere: Secondary | ICD-10-CM | POA: Diagnosis present

## 2016-11-08 DIAGNOSIS — Z915 Personal history of self-harm: Secondary | ICD-10-CM

## 2016-11-08 DIAGNOSIS — Z881 Allergy status to other antibiotic agents status: Secondary | ICD-10-CM

## 2016-11-08 LAB — URINALYSIS, ROUTINE W REFLEX MICROSCOPIC
BILIRUBIN URINE: NEGATIVE
GLUCOSE, UA: NEGATIVE mg/dL
HGB URINE DIPSTICK: NEGATIVE
KETONES UR: NEGATIVE mg/dL
Leukocytes, UA: NEGATIVE
Nitrite: NEGATIVE
PH: 8 (ref 5.0–8.0)
PROTEIN: NEGATIVE mg/dL
Specific Gravity, Urine: 1.011 (ref 1.005–1.030)

## 2016-11-08 LAB — CBG MONITORING, ED: GLUCOSE-CAPILLARY: 95 mg/dL (ref 65–99)

## 2016-11-08 NOTE — ED Triage Notes (Signed)
Patient arrived from [facility] via EMS with complaints of altered mental status. Per ems patient's right arm is swollen, red, and warm to touch. She also has some cuts to arm s/p SI. Patient has strong urine smell.  Per family she is generally A&Ox4 but today they reported alert to name only. Patient has also been incontinent today. Per ems she has hx of anemia and former drug user as well. VS: 116/82, 104, 24 resp, 99% RA,  CBG 97. NSR

## 2016-11-08 NOTE — ED Provider Notes (Signed)
[facility]-EMERGENCY DEPT Provider Note   CSN: [id number] Arrival date & time: 10/13/2016  2155     History   Chief Complaint Chief Complaint  Patient presents with  . Altered Mental Status    HPI Dorothy Dyer is a 41 y.o. female.  Patient with PMH of HTN, anemia, hepatic encephalopathy, presents to the ED with a chief complaint of AMS.  She is from [facility] nursing home where she has been in rehab after being admitted for weakness and hepatic encephalopathy.  Per [employer], they have noticed a decline in the patient today, claiming that she has not been acting right, has been incontinent of urine, and less responsive than normal.  Hx is limited 2/2 acuity of condition.  LEVEL 5 CAVEAT applies.   The history is provided by the patient. No language interpreter was used.    Past Medical History:  Diagnosis Date  . Abdominal pain, chronic, generalized   . Acid reflux   . Anemia   . Anxiety   . Arthritis   . AVN of femur (HCC)    left hip  . Clotting disorder (HCC)    undetermined  . Cystitis   . 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 Hypertension   . Neuromuscular disorder (HCC)   . Osteoporosis   . Psoriasis   . Thyroid disease     Patient Active Problem List   Diagnosis Date Noted  . Acute hepatic encephalopathy 11/01/2016  . Weakness 10/29/2016  . Tylenol overdose 10/22/2016  . Pressure injury of skin 10/15/2016  . Bilateral lower leg cellulitis 10/14/2016  . Leg swelling 10/14/2016  . Pain 09/27/2016  . Clavicular fracture 09/27/2016  . Abdominal pain, chronic, generalized   . Protein-calorie malnutrition, severe 06/02/2016  . Acute encephalopathy 06/01/2016  . Chronic left hip pain 06/01/2016  . Overdose of opiate or related narcotic, accidental or unintentional, initial encounter 06/01/2016  . Lower extremity cellulitis 06/01/2016  . 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 Left knee pain 06/01/2016  . Bilateral lower extremity edema  06/01/2016  . Acute hypokalemia 02/11/2016  . Hypoglycemia 02/11/2016  . Marijuana abuse 02/11/2016  . Chronic narcotic use 02/11/2016  . AKI (acute kidney injury) (HCC) 02/10/2016  . AVN of femur (HCC)   . 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 Hoarseness 06/17/2014  . 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 Fibromyalgia 06/17/2014  . Chronic abdominal pain 06/17/2014  . Knee pain, bilateral 06/17/2014  . Vitamin D deficiency 06/17/2014  . Vitamin B12 deficiency 06/17/2014  . Iron deficiency anemia 06/17/2014  . Thyroid activity decreased 06/17/2014  . Abnormal Pap smear of cervix 06/17/2014  . Anemia   . Anxiety   . Arthritis   . Acid reflux   . Neuromuscular disorder (HCC)   . Hypertension   . Osteoporosis   . HPV (human papilloma virus) infection     Past Surgical History:  Procedure Laterality Date  . CHOLECYSTECTOMY    . ESOPHAGOGASTRODUODENOSCOPY  10/2014   at [facility], was normal. Dr. Merri Brunette  . GASTRIC BYPASS  05/2001  . SMALL INTESTINE SURGERY  2004   exploratory    OB History    No data available       Home Medications    Prior to Admission medications   Medication Sig Start Date End Date Taking? Authorizing Provider  ALPRAZolam Prudy Feeler) 0.5 MG tablet Take 1 tablet (0.5 mg total) by mouth 2 (two) times daily as needed. 11/03/16   Milagros Loll, MD  B Complex-C (B-COMPLEX WITH VITAMIN C) tablet Take 1 tablet by mouth daily. Patient not taking: Reported on 09/27/2016 06/04/16   Elease Etienne, MD  calcium-vitamin D (OSCAL WITH D) 500-200 MG-UNIT tablet Take 2 tablets by mouth daily with breakfast. Patient not taking: Reported on 09/27/2016 06/04/16   Elease Etienne, MD  furosemide (LASIX) 40 MG tablet Take 1 tablet (40 mg total) by mouth daily. 10/26/16   Rolly Salter, MD  lactulose (CHRONULAC) 10 GM/15ML solution Take 45 mLs (30 g total) by mouth 2 (two) times daily. 11/03/16   Milagros Loll, MD  levothyroxine (SYNTHROID, LEVOTHROID) 125 MCG tablet TAKE 1 TABLET EVERY  DAY Patient taking differently: Take 125 mcg by mouth every other day, alternating with 137 mcg tablet. 11/25/15   Donita Brooks, MD  levothyroxine (SYNTHROID, LEVOTHROID) 137 MCG tablet Take 137 mcg by mouth every other day. Alternating with 125 mcg tablet.    [provider]  LYRICA 225 MG capsule Take 1 capsule by mouth 2 (two) times daily. 10/05/16   [provider]  Magnesium Oxide 400 MG CAPS Take 1 capsule (400 mg total) by mouth 2 (two) times daily. Patient not taking: Reported on 10/22/2016 09/29/16   Auburn Bilberry, MD  metolazone (ZAROXOLYN) 5 MG tablet TAKE 1 TABLET BY MOUTH EVERY DAY 30 MINS BEFORE LASIX 1-2 TIMES PER WEEK. 09/05/16   Donita Brooks, MD  mirtazapine (REMERON) 15 MG tablet Take 1 tablet (15 mg total) by mouth at bedtime. 11/07/16 11/07/17  Milagros Loll, MD  Multiple Vitamin (MULTIVITAMIN WITH MINERALS) TABS tablet Take 1 tablet by mouth 2 (two) times daily. 06/04/16   Hongalgi, Maximino Greenland, MD  naproxen sodium (ANAPROX) 220 MG tablet Take 1 tablet (220 mg total) by mouth 2 (two) times daily with a meal. 11/03/16   Milagros Loll, MD  nortriptyline (PAMELOR) 75 MG capsule Take 1 capsule (75 mg total) by mouth at bedtime. 10/26/16   Rolly Salter, MD  omega-3 acid ethyl esters (LOVAZA) 1 g capsule Take 1 capsule (1 g total) by mouth daily. 06/04/16   Hongalgi, Maximino Greenland, MD  oxyCODONE-acetaminophen (PERCOCET/ROXICET) 5-325 MG tablet Take 1 tablet by mouth every 8 (eight) hours as needed. 11/03/16   Milagros Loll, MD  potassium chloride SA (K-DUR,KLOR-CON) 20 MEQ tablet Take 1 tablet (20 mEq total) by mouth 2 (two) times daily as needed (when taking lasix). Patient taking differently: Take 20 mEq by mouth daily.  06/03/16   Hongalgi, Maximino Greenland, MD  protein supplement (UNJURY CHICKEN SOUP) POWD Take 27 g (8 oz total) by mouth 2 (two) times daily after a meal. Patient not taking: Reported on 09/27/2016 06/04/16   Elease Etienne, MD  protein supplement shake  (PREMIER PROTEIN) LIQD Take 325 mLs (11 oz total) by mouth 3 (three) times daily between meals. 11/03/16   Milagros Loll, MD  RABEprazole (ACIPHEX) 20 MG tablet 1 tab po bid Patient taking differently: Take 20 mg by mouth daily.  06/29/16   Donita Brooks, MD  rifaximin (XIFAXAN) 550 MG TABS tablet Take 1 tablet (550 mg total) by mouth 2 (two) times daily. 11/03/16   Milagros Loll, MD  simethicone (MYLICON) 80 MG chewable tablet Chew 1 tablet (80 mg total) by mouth 4 (four) times daily as needed for flatulence. 09/29/16   Auburn Bilberry, MD  thiamine 100 MG tablet Take 1 tablet (100 mg total) by mouth daily. Patient not taking: Reported on 10/22/2016 06/04/16   Elease Etienne, MD  Family History Family History  Problem Relation Age of Onset  . Depression Mother   . Diabetes Mother   . Hyperlipidemia Mother   . Depression Father   . Hearing loss Father   . Hyperlipidemia Father   . Hypertension Father   . Learning disabilities Father   . Mental illness Father   . Asthma Brother   . Alcohol abuse Maternal Grandmother   . Arthritis Maternal Grandmother   . Cancer Maternal Grandmother   . Cancer Maternal Grandfather   . Mental illness Paternal Grandmother   . Heart disease Paternal Grandfather   . Stroke Paternal Grandfather     Social History Social History  Substance Use Topics  . Smoking status: Current Every Day Smoker    Packs/day: 0.50    Types: Cigarettes  . Smokeless tobacco: Never Used  . Alcohol use No     Comment: once a week     Allergies   Ibuprofen; Toradol [ketorolac tromethamine]; Aspirin; and Cleocin [clindamycin hcl]   Review of Systems Review of Systems  Unable to perform ROS: Mental status change     Physical Exam Updated Vital Signs Ht 5\' 1"  (1.549 m)   Wt 81.6 kg (180 lb)   SpO2 (!) 9%   BMI 34.01 kg/m   Physical Exam  Constitutional: She appears well-developed and well-nourished.  HENT:  Head: Normocephalic and atraumatic.    Eyes: Pupils are equal, round, and reactive to light. Conjunctivae and EOM are normal.  Neck: Normal range of motion. Neck supple.  Cardiovascular: Normal rate and regular rhythm.  Exam reveals no gallop and no friction rub.   No murmur heard. Pulmonary/Chest: Effort normal and breath sounds normal. No respiratory distress. She has no wheezes. She has no rales. She exhibits no tenderness.  Abdominal: Soft. Bowel sounds are normal. She exhibits no distension and no mass. There is no tenderness. There is no rebound and no guarding.  No focal abdominal tenderness, no RLQ tenderness or pain at McBurney's point, no RUQ tenderness or Murphy's sign, no left-sided abdominal tenderness, no fluid wave, or signs of peritonitis   Musculoskeletal: Normal range of motion. She exhibits edema. She exhibits no tenderness.  Bilateral lower extremity edema, wearing unna boots  Neurological: She is alert.  Awake, but not oriented GCS 9  Skin: Skin is warm and dry.  Smalls linear abrasions on upper extremities  Psychiatric:  Unable to assess  Nursing note and vitals reviewed.    ED Treatments / Results  Labs (all labs ordered are listed, but only abnormal results are displayed) Labs Reviewed  CULTURE, BLOOD (ROUTINE X 2)  CULTURE, BLOOD (ROUTINE X 2)  URINE CULTURE  COMPREHENSIVE METABOLIC PANEL  PROTIME-INR  URINALYSIS, ROUTINE W REFLEX MICROSCOPIC  CBC WITH DIFFERENTIAL/PLATELET  CBG MONITORING, ED  I-STAT CG4 LACTIC ACID, ED    EKG  EKG Interpretation None       Radiology No results found.  Procedures Procedures (including critical care time) CRITICAL CARE Performed by: Roxy HorsemanBROWNING, Lilley Hubble   Total critical care time: 53 minutes  Critical care time was exclusive of separately billable procedures and treating other patients.  Critical care was necessary to treat or prevent imminent or life-threatening deterioration.  Critical care was time spent personally by me on the following  activities: development of treatment plan with patient and/or surrogate as well as nursing, discussions with consultants, evaluation of patient's response to treatment, examination of patient, obtaining history from patient or surrogate, ordering and performing treatments and interventions, ordering  and review of laboratory studies, ordering and review of radiographic studies, pulse oximetry and re-evaluation of patient's condition.  Medications Ordered in ED Medications - No data to display   Initial Impression / Assessment and Plan / ED Course  I have reviewed the triage vital signs and the nursing notes.  Pertinent labs & imaging results that were available during my care of the patient were reviewed by me and considered in my medical decision making (see chart for details).     Patient here with altered mental status. Vital signs stable. Patient was just discharged from the hospital on 8/28 following being admitted for hepatic encephalopathy. I'm concerned about the same today. Will check labs, will reassess.  We've had extreme difficulty in obtaining blood work on the patient. Multiple nurses, the IV team, and myself of all tried numerous times.  Ultimately, Dr. Blinda Leatherwood, was successful after several more attempts to get blood.  Patient seen by and discussed with Dr. Blinda Leatherwood.  Ammonia is 249.  Will give rectal lactulose.    Appreciate Dr. Toniann Fail for admitting the patient.  Final Clinical Impressions(s) / ED Diagnoses   Final diagnoses:  Hepatic encephalopathy [facility])    New Prescriptions New Prescriptions   No medications on file     Roxy Horseman, Cordelia Poche 11/09/16 1610    Shaune Pollack, MD 11/10/16 1159

## 2016-11-08 NOTE — ED Notes (Signed)
Multiple IV sticks with no access. Consult for IV team pending and will proceed with US IV evaluation. Labs pending due to access.

## 2016-11-09 ENCOUNTER — Emergency Department (HOSPITAL_COMMUNITY): Payer: Medicare HMO

## 2016-11-09 ENCOUNTER — Encounter (HOSPITAL_COMMUNITY): Payer: Self-pay | Admitting: Internal Medicine

## 2016-11-09 DIAGNOSIS — R69 Illness, unspecified: Secondary | ICD-10-CM | POA: Diagnosis not present

## 2016-11-09 DIAGNOSIS — K7290 Hepatic failure, unspecified without coma: Secondary | ICD-10-CM | POA: Diagnosis not present

## 2016-11-09 DIAGNOSIS — Z515 Encounter for palliative care: Secondary | ICD-10-CM | POA: Diagnosis not present

## 2016-11-09 DIAGNOSIS — L309 Dermatitis, unspecified: Secondary | ICD-10-CM | POA: Diagnosis present

## 2016-11-09 DIAGNOSIS — Z4682 Encounter for fitting and adjustment of non-vascular catheter: Secondary | ICD-10-CM | POA: Diagnosis not present

## 2016-11-09 DIAGNOSIS — K7200 Acute and subacute hepatic failure without coma: Secondary | ICD-10-CM | POA: Diagnosis not present

## 2016-11-09 DIAGNOSIS — K7030 Alcoholic cirrhosis of liver without ascites: Secondary | ICD-10-CM | POA: Diagnosis present

## 2016-11-09 DIAGNOSIS — R55 Syncope and collapse: Secondary | ICD-10-CM | POA: Diagnosis not present

## 2016-11-09 DIAGNOSIS — X58XXXD Exposure to other specified factors, subsequent encounter: Secondary | ICD-10-CM | POA: Diagnosis present

## 2016-11-09 DIAGNOSIS — R569 Unspecified convulsions: Secondary | ICD-10-CM | POA: Diagnosis present

## 2016-11-09 DIAGNOSIS — R296 Repeated falls: Secondary | ICD-10-CM | POA: Diagnosis present

## 2016-11-09 DIAGNOSIS — E039 Hypothyroidism, unspecified: Secondary | ICD-10-CM | POA: Diagnosis present

## 2016-11-09 DIAGNOSIS — K7581 Nonalcoholic steatohepatitis (NASH): Secondary | ICD-10-CM | POA: Diagnosis present

## 2016-11-09 DIAGNOSIS — E43 Unspecified severe protein-calorie malnutrition: Secondary | ICD-10-CM | POA: Diagnosis not present

## 2016-11-09 DIAGNOSIS — K7040 Alcoholic hepatic failure without coma: Secondary | ICD-10-CM | POA: Diagnosis present

## 2016-11-09 DIAGNOSIS — E7220 Disorder of urea cycle metabolism, unspecified: Secondary | ICD-10-CM | POA: Diagnosis not present

## 2016-11-09 DIAGNOSIS — B964 Proteus (mirabilis) (morganii) as the cause of diseases classified elsewhere: Secondary | ICD-10-CM | POA: Diagnosis not present

## 2016-11-09 DIAGNOSIS — Z789 Other specified health status: Secondary | ICD-10-CM | POA: Diagnosis not present

## 2016-11-09 DIAGNOSIS — Z6834 Body mass index (BMI) 34.0-34.9, adult: Secondary | ICD-10-CM | POA: Diagnosis not present

## 2016-11-09 DIAGNOSIS — I10 Essential (primary) hypertension: Secondary | ICD-10-CM | POA: Diagnosis present

## 2016-11-09 DIAGNOSIS — Z8249 Family history of ischemic heart disease and other diseases of the circulatory system: Secondary | ICD-10-CM | POA: Diagnosis not present

## 2016-11-09 DIAGNOSIS — R7881 Bacteremia: Secondary | ICD-10-CM | POA: Diagnosis not present

## 2016-11-09 DIAGNOSIS — D509 Iron deficiency anemia, unspecified: Secondary | ICD-10-CM | POA: Diagnosis present

## 2016-11-09 DIAGNOSIS — Z915 Personal history of self-harm: Secondary | ICD-10-CM | POA: Diagnosis not present

## 2016-11-09 DIAGNOSIS — Z9884 Bariatric surgery status: Secondary | ICD-10-CM | POA: Diagnosis not present

## 2016-11-09 DIAGNOSIS — Z7189 Other specified counseling: Secondary | ICD-10-CM | POA: Diagnosis not present

## 2016-11-09 DIAGNOSIS — K219 Gastro-esophageal reflux disease without esophagitis: Secondary | ICD-10-CM | POA: Diagnosis present

## 2016-11-09 DIAGNOSIS — Z66 Do not resuscitate: Secondary | ICD-10-CM | POA: Diagnosis not present

## 2016-11-09 DIAGNOSIS — D696 Thrombocytopenia, unspecified: Secondary | ICD-10-CM | POA: Diagnosis present

## 2016-11-09 DIAGNOSIS — M810 Age-related osteoporosis without current pathological fracture: Secondary | ICD-10-CM | POA: Diagnosis present

## 2016-11-09 DIAGNOSIS — I872 Venous insufficiency (chronic) (peripheral): Secondary | ICD-10-CM | POA: Diagnosis present

## 2016-11-09 DIAGNOSIS — Z818 Family history of other mental and behavioral disorders: Secondary | ICD-10-CM | POA: Diagnosis not present

## 2016-11-09 LAB — BLOOD CULTURE ID PANEL (REFLEXED)
ACINETOBACTER BAUMANNII: NOT DETECTED
CANDIDA ALBICANS: NOT DETECTED
CANDIDA GLABRATA: NOT DETECTED
CANDIDA KRUSEI: NOT DETECTED
CANDIDA PARAPSILOSIS: NOT DETECTED
CARBAPENEM RESISTANCE: NOT DETECTED
Candida tropicalis: NOT DETECTED
ENTEROBACTER CLOACAE COMPLEX: NOT DETECTED
ENTEROBACTERIACEAE SPECIES: DETECTED — AB
ENTEROCOCCUS SPECIES: NOT DETECTED
ESCHERICHIA COLI: NOT DETECTED
Haemophilus influenzae: NOT DETECTED
KLEBSIELLA OXYTOCA: NOT DETECTED
KLEBSIELLA PNEUMONIAE: NOT DETECTED
LISTERIA MONOCYTOGENES: NOT DETECTED
Neisseria meningitidis: NOT DETECTED
PSEUDOMONAS AERUGINOSA: NOT DETECTED
Proteus species: DETECTED — AB
STAPHYLOCOCCUS AUREUS BCID: NOT DETECTED
STREPTOCOCCUS PNEUMONIAE: NOT DETECTED
STREPTOCOCCUS PYOGENES: NOT DETECTED
Serratia marcescens: NOT DETECTED
Staphylococcus species: NOT DETECTED
Streptococcus agalactiae: NOT DETECTED
Streptococcus species: NOT DETECTED

## 2016-11-09 LAB — COMPREHENSIVE METABOLIC PANEL
ALBUMIN: 1.4 g/dL — AB (ref 3.5–5.0)
ALK PHOS: 257 U/L — AB (ref 38–126)
ALT: 44 U/L (ref 14–54)
ANION GAP: 8 (ref 5–15)
AST: 44 U/L — AB (ref 15–41)
BILIRUBIN TOTAL: 0.9 mg/dL (ref 0.3–1.2)
BUN: 12 mg/dL (ref 6–20)
CALCIUM: 7.4 mg/dL — AB (ref 8.9–10.3)
CO2: 29 mmol/L (ref 22–32)
Chloride: 103 mmol/L (ref 101–111)
Creatinine, Ser: 0.74 mg/dL (ref 0.44–1.00)
GFR calc Af Amer: >60 mL/min (ref >60)
GLUCOSE: 81 mg/dL (ref 65–99)
Potassium: 4.5 mmol/L (ref 3.5–5.1)
Sodium: 140 mmol/L (ref 135–145)
TOTAL PROTEIN: 4 g/dL — AB (ref 6.5–8.1)

## 2016-11-09 LAB — GLUCOSE, CAPILLARY
GLUCOSE-CAPILLARY: 106 mg/dL — AB (ref 65–99)
Glucose-Capillary: 87 mg/dL (ref 65–99)
Glucose-Capillary: 87 mg/dL (ref 65–99)

## 2016-11-09 LAB — CBC WITH DIFFERENTIAL/PLATELET
BASOS ABS: 0.1 10*3/uL (ref 0.0–0.1)
Basophils Relative: 1 %
EOS PCT: 4 %
Eosinophils Absolute: 0.2 10*3/uL (ref 0.0–0.7)
HEMATOCRIT: 27 % — AB (ref 36.0–46.0)
HEMOGLOBIN: 9.5 g/dL — AB (ref 12.0–15.0)
LYMPHS ABS: 2.1 10*3/uL (ref 0.7–4.0)
LYMPHS PCT: 38 %
MCH: 34.1 pg — ABNORMAL HIGH (ref 26.0–34.0)
MCHC: 35.2 g/dL (ref 30.0–36.0)
MCV: 96.8 fL (ref 78.0–100.0)
MONOS PCT: 11 %
Monocytes Absolute: 0.6 10*3/uL (ref 0.1–1.0)
NEUTROS ABS: 2.4 10*3/uL (ref 1.7–7.7)
Neutrophils Relative %: 46 %
Platelets: 155 10*3/uL (ref 150–400)
RBC: 2.79 MIL/uL — ABNORMAL LOW (ref 3.87–5.11)
RDW: 14.2 % (ref 11.5–15.5)
WBC: 5.4 10*3/uL (ref 4.0–10.5)

## 2016-11-09 LAB — I-STAT CG4 LACTIC ACID, ED: LACTIC ACID, VENOUS: 0.47 mmol/L — AB (ref 0.5–1.9)

## 2016-11-09 LAB — MRSA PCR SCREENING: MRSA by PCR: NEGATIVE

## 2016-11-09 LAB — AMMONIA: AMMONIA: 249 umol/L — AB (ref 9–35)

## 2016-11-09 MED ORDER — LEVOTHYROXINE SODIUM 100 MCG IV SOLR
62.5000 ug | Freq: Every day | INTRAVENOUS | Status: DC
  Administered 2016-11-09 – 2016-11-10 (×2): 62.5 ug via INTRAVENOUS
  Filled 2016-11-09 (×2): qty 5

## 2016-11-09 MED ORDER — LACTULOSE ENEMA
300.0000 mL | Freq: Once | ORAL | Status: AC
  Administered 2016-11-09: 300 mL via RECTAL
  Filled 2016-11-09 (×2): qty 300

## 2016-11-09 MED ORDER — THIAMINE HCL 100 MG/ML IJ SOLN
100.0000 mg | Freq: Every day | INTRAMUSCULAR | Status: DC
  Administered 2016-11-09 – 2016-11-10 (×2): 100 mg via INTRAVENOUS
  Filled 2016-11-09 (×2): qty 2

## 2016-11-09 MED ORDER — ORAL CARE MOUTH RINSE
15.0000 mL | Freq: Two times a day (BID) | OROMUCOSAL | Status: DC
  Administered 2016-11-09 – 2016-11-11 (×5): 15 mL via OROMUCOSAL

## 2016-11-09 MED ORDER — DEXTROSE 5 % IV SOLN
2.0000 g | Freq: Every day | INTRAVENOUS | Status: DC
  Filled 2016-11-09: qty 2

## 2016-11-09 MED ORDER — DEXTROSE 5 % IV SOLN
2.0000 g | Freq: Every day | INTRAVENOUS | Status: DC
  Administered 2016-11-09 – 2016-11-11 (×2): 2 g via INTRAVENOUS
  Filled 2016-11-09 (×2): qty 2

## 2016-11-09 NOTE — ED Notes (Signed)
Patient tolerated lactulose enema without complications. Rectal tube inflated with 30ml and capped off for medication to remain over 60 min.

## 2016-11-09 NOTE — Care Management Note (Signed)
Case Management Note  Patient Details  Name: Dorothy PeersCrystal Puff MRN: [id number] Date of Birth: 06/12/1975  Subjective/Objective:                  41 y.o. 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 The first time was for accidental overdose of Tylenol and the second time for follow-up. Patient also has a history of alcohol abuse. Patient was transferred from nursing home after patient was found to be confused.   ED Course: In the ER patient is confused and encephalopathic. Ammonia levels are 249. Patient was given per rectal lactulose and admitted for acute hepatic encephalopathy. On exam patient is still confused and encephalopathic. Pupils are equal and reacting to light. Has chronic lower extremity skin changes from stasis dermatitis.  Action/Plan: Date:  November 09, 2016 Chart reviewed for concurrent status and case management needs. Will continue to follow patient progress. Discharge Planning: following for needs Expected discharge date: [id number] Marcelle SmilingRhonda Allani Reber, BSN, [place], [place]   [phone]  Expected Discharge Date:   (unknown)               Expected Discharge Plan:  Home/Self Care  In-House Referral:     Discharge planning Services  CM Consult  Post Acute Care Choice:    Choice offered to:     DME Arranged:    DME Agency:     HH Arranged:    HH Agency:     Status of Service:  In process, will continue to follow  If discussed at Long Length of Stay Meetings, dates discussed:    Additional Comments:  Golda AcreDavis, Rojean Ige Lynn, RN 11/09/2016, 8:52 AM

## 2016-11-09 NOTE — ED Notes (Signed)
Patient transported to X-ray 

## 2016-11-09 NOTE — ED Provider Notes (Signed)
Patient presented to the ER with altered mental status. Patient recently admitted with similar symptoms, found to be encephalopathic secondary to hyperammonemia.  Face to face Exam: HEENT - PERRLA Lungs - CTAB Heart - RRR, no M/R/G Abd - S/NT/ND Neuro - obtunded Ext - edema    Physical Exam  BP 111/77   Pulse 95   Temp 97.7 F (36.5 C) (Rectal)   Resp 14   Ht 5\' 1"  (1.549 m)   Wt 81.6 kg (180 lb)   SpO2 100%   BMI 34.01 kg/m   Physical Exam  ED Course  ARTERIAL BLOOD GAS Date/Time: 11/09/2016 3:45 AM Performed by: Gilda CreasePOLLINA, Allye Hoyos J Authorized by: Gilda CreasePOLLINA, Walda Hertzog J   Consent:    Consent obtained:  Emergent situation Anesthesia (see MAR for exact dosages):    Anesthesia method:  None Procedure details:    Location:  L radial   Number of attempts:  2 Post-procedure details:    Dressing applied: yes     Bleeding:  Hemostasis achieved   Patient tolerance of procedure:  Tolerated well, no immediate complications Comments:     Arterial puncture performed to obtain labs secondary to extreme difficulty with venipuncture      Ammonia is 249, explain patient's current level of consciousness. She appears to be protecting her airway. Will require hospitalization for further management.     Gilda CreasePollina, Sian Rockers J, MD 11/09/16 [phone]

## 2016-11-09 NOTE — Progress Notes (Signed)
PHARMACY - PHYSICIAN COMMUNICATION CRITICAL VALUE ALERT - BLOOD CULTURE IDENTIFICATION (BCID)  Results for orders placed or performed during the hospital encounter of 10/08/16  Blood Culture ID Panel (Reflexed) (Collected: 11/09/2016  1:05 AM)  Result Value Ref Range   Enterococcus species NOT DETECTED NOT DETECTED   Listeria monocytogenes NOT DETECTED NOT DETECTED   Staphylococcus species NOT DETECTED NOT DETECTED   Staphylococcus aureus NOT DETECTED NOT DETECTED   Streptococcus species NOT DETECTED NOT DETECTED   Streptococcus agalactiae NOT DETECTED NOT DETECTED   Streptococcus pneumoniae NOT DETECTED NOT DETECTED   Streptococcus pyogenes NOT DETECTED NOT DETECTED   Acinetobacter baumannii NOT DETECTED NOT DETECTED   Enterobacteriaceae species DETECTED (A) NOT DETECTED   Enterobacter cloacae complex NOT DETECTED NOT DETECTED   Escherichia coli NOT DETECTED NOT DETECTED   Klebsiella oxytoca NOT DETECTED NOT DETECTED   Klebsiella pneumoniae NOT DETECTED NOT DETECTED   Proteus species DETECTED (A) NOT DETECTED   Serratia marcescens NOT DETECTED NOT DETECTED   Carbapenem resistance NOT DETECTED NOT DETECTED   Haemophilus influenzae NOT DETECTED NOT DETECTED   Neisseria meningitidis NOT DETECTED NOT DETECTED   Pseudomonas aeruginosa NOT DETECTED NOT DETECTED   Candida albicans NOT DETECTED NOT DETECTED   Candida glabrata NOT DETECTED NOT DETECTED   Candida krusei NOT DETECTED NOT DETECTED   Candida parapsilosis NOT DETECTED NOT DETECTED   Candida tropicalis NOT DETECTED NOT DETECTED     Changes to prescribed antibiotics required: continue Rocephin 2g IV q24 per recommendations  Berkley HarveyLegge, Giancarlo Askren Marshall 11/09/2016  8:50 PM

## 2016-11-09 NOTE — Progress Notes (Signed)
Ginger, RN updated that patient will not have PICC placed today unless status of IV access changes due to current PICC workload.  Currently patient has 2 IVs that are patent.  Ginger, RN advised to contact IV team if patient needs restart.  If [facility] nurse unable to restart IV if needed then patient will be reprioritized as able.  Thank you,  Gasper LloydKerry Zionna Homewood, RN [facility]

## 2016-11-09 NOTE — ED Notes (Signed)
Patient is very lethargic. Alert when name called. Able to speak and states name and that she is in hospital. Responds to painful stimuli and follows commands. Patient is incontinent.

## 2016-11-09 NOTE — Progress Notes (Signed)
Patient seen and examined at bedside, admitted after midnight. Please see earlier notes by Dr. Toniann FailKakrakandy. 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 Ammonia level elevated on admission, lactulose given. Patient still encephalopathic this morning. Keep in step down unit. Palliative care team consulted for further goals of care discussion.  Debbora PrestoMAGICK-MYERS, ISKRA, MD  [facility] Pager [phone]  If 7PM-7AM, please contact night-coverage [email or web address] Password [email or web address]

## 2016-11-09 NOTE — Progress Notes (Signed)
Initial Nutrition Assessment  DOCUMENTATION CODES:   Severe malnutrition in context of social or environmental circumstances, Obesity unspecified  INTERVENTION:  - Diet advancement as medically feasible. - Will provide appropriate interventions at follow-up.  - Will order multivitamin once PO medications are allowable.   NUTRITION DIAGNOSIS:   Malnutrition (severe) related to social / environmental circumstances (alcohol abuse) as evidenced by severe depletion of muscle mass, severe depletion of body fat.  GOAL:   Patient will meet greater than or equal to 90% of their needs  MONITOR:   Diet advancement, Weight trends, Labs, Skin  REASON FOR ASSESSMENT:   Low Braden  ASSESSMENT:   41 y.o. 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 The first time was for accidental overdose of Tylenol and the second time for follow-up. Patient also has a history of alcohol abuse. Patient was transferred from nursing home after patient was found to be confused.   Pt seen for low Braden. BMI indicates obesity. Pt is unable to provide information and no family/visitors present to provide PTA information. Pt was last seen by an RD at [facility] on 11/06/16. During that admission pt had been eating <25% of meals and was experiencing N/V/D for several days. She was ordered Premier Protein during that hospitalization and was only drinking ~1 bottle/day; she had requested to also try [place] but pt was discharged before RD could follow-up on tolerance of that supplement.   Physical assessment consistent with prior assessments: moderate to severe fat wasting and moderate to severe muscle wasting. She has lost 1 lb (0.5% body weight) in the past 2 days). Weight has had a few fluctuations but overall has been stable since December  2017.  Medications reviewed; 300 mL rectal lactulose x1 dose today, 62.5 mcg IV Synthroid/day, 100 mg IV thiamine/day.  Labs reviewed; Ca: 7.4 mmol/L, Alk Phos elevated, ammonia: 249 umol/L.    Diet Order:  Diet NPO time specified  Skin:   R buttocks non-pressure injury x2  Last BM:  8/30  Height:   Ht Readings from Last 1 Encounters:  10/24/2016 5' 1"  (1.549 m)    Weight:   Wt Readings from Last 1 Encounters:  11/09/16 179 lb 10.8 oz (81.5 kg)    Ideal Body Weight:     BMI:  Body mass index is 33.95 kg/m.  Estimated Nutritional Needs:   Kcal:  1800-2000  Protein:  80-90 grams  Fluid:  >/= 1.8 L/day  EDUCATION NEEDS:   No education needs identified at this time    Jarome Matin, MS, RD, LDN, CNSC Inpatient Clinical Dietitian Pager # [phone] After hours/weekend pager # [phone]

## 2016-11-09 NOTE — H&P (Signed)
History and Physical    Dorothy Dyer ZOX:[id number] DOB: Aug 01, 1975 DOA: 08-Dec-2016  PCP: Patient, No Pcp Per  Patient coming from: Confusion.  Chief Complaint: Nursing home.  HPI: Dorothy Dyer is a 41 y.o. 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 The first time was for accidental overdose of Tylenol and the second time for follow-up. Patient also has a history of alcohol abuse. Patient was transferred from nursing home after patient was found to be confused.   ED Course: In the ER patient is confused and encephalopathic. Ammonia levels are 249. Patient was given per rectal lactulose and admitted for acute hepatic encephalopathy. On exam patient is still confused and encephalopathic. Pupils are equal and reacting to light. Has chronic lower extremity skin changes from stasis dermatitis.  Review of Systems: As per HPI, rest all negative.   Past Medical History:  Diagnosis Date  . Abdominal pain, chronic, generalized   . Acid reflux   . Anemia   . Anxiety   . Arthritis   . AVN of femur (HCC)    left hip  . Clotting disorder (HCC)    undetermined  . Cystitis   . 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 Hypertension   . Neuromuscular disorder (HCC)   . Osteoporosis   . Psoriasis   . Thyroid disease     Past Surgical History:  Procedure Laterality Date  . CHOLECYSTECTOMY    . ESOPHAGOGASTRODUODENOSCOPY  10/2014   at [facility], was normal. Dr. Merri Brunette  . GASTRIC BYPASS  05/2001  . SMALL INTESTINE SURGERY  2004   exploratory     reports that she has been smoking Cigarettes.  She has been smoking about 0.50 packs per day. She has never used smokeless tobacco. She reports that she does not drink alcohol or use drugs.  Allergies  Allergen Reactions  . Ibuprofen Nausea Only and Other (See Comments)    Bad  stomach pains  . Toradol [Ketorolac Tromethamine] Anxiety  . Aspirin Hives  . Cleocin [Clindamycin Hcl] Itching and Rash    Family History  Problem Relation Age of Onset  . Depression Mother   . Diabetes Mother   . Hyperlipidemia Mother   . Depression Father   . Hearing loss Father   . Hyperlipidemia Father   . Hypertension Father   . Learning disabilities Father   . Mental illness Father   . Asthma Brother   . Alcohol abuse Maternal Grandmother   . Arthritis Maternal Grandmother   . Cancer Maternal Grandmother   . Cancer Maternal Grandfather   . Mental illness Paternal Grandmother   . Heart disease Paternal Grandfather   . Stroke Paternal Grandfather     Prior to Admission medications   Medication Sig Start Date End Date Taking? Authorizing Provider  ALPRAZolam Prudy Feeler) 0.5 MG tablet Take 1 tablet (0.5 mg total) by mouth 2 (two) times daily as needed. 11/03/16  Yes Sudini, Wardell Heath, MD  B Complex-C (B-COMPLEX WITH VITAMIN C) tablet Take 1 tablet by mouth daily. 06/04/16  Yes Hongalgi, Maximino Greenland, MD  calcium-vitamin D (OSCAL WITH D) 500-200 MG-UNIT tablet Take 2 tablets by mouth daily with breakfast. 06/04/16  Yes Hongalgi, Maximino Greenland, MD  furosemide (LASIX) 40 MG tablet Take 1 tablet (40 mg total) by mouth daily. 10/26/16  Yes Rolly Salter, MD  lactulose (CHRONULAC) 10 GM/15ML solution Take 45  mLs (30 g total) by mouth 2 (two) times daily. 11/03/16  Yes Sudini, Wardell Heath, MD  levothyroxine (SYNTHROID, LEVOTHROID) 125 MCG tablet TAKE 1 TABLET EVERY DAY Patient taking differently: Take 125 mcg by mouth every other day, alternating with 137 mcg tablet. 11/25/15  Yes Donita Brooks, MD  levothyroxine (SYNTHROID, LEVOTHROID) 137 MCG tablet Take 137 mcg by mouth every other day. Alternating with 125 mcg tablet.   Yes [provider]  LYRICA 225 MG capsule Take 1 capsule by mouth 2 (two) times daily. 10/05/16  Yes [provider]  Magnesium Oxide 400 MG CAPS Take 1 capsule (400  mg total) by mouth 2 (two) times daily. 09/29/16  Yes Auburn Bilberry, MD  mirtazapine (REMERON) 15 MG tablet Take 1 tablet (15 mg total) by mouth at bedtime. 11/07/16 11/07/17 Yes SudiniWardell Heath, MD  Multiple Vitamin (MULTIVITAMIN WITH MINERALS) TABS tablet Take 1 tablet by mouth 2 (two) times daily. 06/04/16  Yes Hongalgi, Maximino Greenland, MD  naproxen sodium (ANAPROX) 220 MG tablet Take 1 tablet (220 mg total) by mouth 2 (two) times daily with a meal. 11/03/16  Yes Sudini, Wardell Heath, MD  nortriptyline (PAMELOR) 75 MG capsule Take 1 capsule (75 mg total) by mouth at bedtime. 10/26/16  Yes Rolly Salter, MD  omega-3 acid ethyl esters (LOVAZA) 1 g capsule Take 1 capsule (1 g total) by mouth daily. 06/04/16  Yes Hongalgi, Maximino Greenland, MD  omeprazole (PRILOSEC) 20 MG capsule Take 20 mg by mouth 2 (two) times daily before a meal.   Yes [provider]  oxyCODONE-acetaminophen (PERCOCET/ROXICET) 5-325 MG tablet Take 1 tablet by mouth every 8 (eight) hours as needed. Patient taking differently: Take 1 tablet by mouth every 8 (eight) hours as needed for moderate pain.  11/03/16  Yes Sudini, Wardell Heath, MD  potassium chloride SA (K-DUR,KLOR-CON) 20 MEQ tablet Take 1 tablet (20 mEq total) by mouth 2 (two) times daily as needed (when taking lasix). Patient taking differently: Take 40 mEq by mouth daily.  06/03/16  Yes Hongalgi, Maximino Greenland, MD  protein supplement shake (PREMIER PROTEIN) LIQD Take 325 mLs (11 oz total) by mouth 3 (three) times daily between meals. 11/03/16  Yes Milagros Loll, MD  rifaximin (XIFAXAN) 550 MG TABS tablet Take 1 tablet (550 mg total) by mouth 2 (two) times daily. 11/03/16  Yes Milagros Loll, MD  simethicone (MYLICON) 80 MG chewable tablet Chew 1 tablet (80 mg total) by mouth 4 (four) times daily as needed for flatulence. 09/29/16  Yes Auburn Bilberry, MD  thiamine 100 MG tablet Take 1 tablet (100 mg total) by mouth daily. 06/04/16  Yes Hongalgi, Maximino Greenland, MD  metolazone (ZAROXOLYN) 5 MG tablet TAKE 1  TABLET BY MOUTH EVERY DAY 30 MINS BEFORE LASIX 1-2 TIMES PER WEEK. Patient not taking: Reported on 11-26-2016 09/05/16   Donita Brooks, MD  protein supplement Corliss Marcus CHICKEN SOUP) POWD Take 27 g (8 oz total) by mouth 2 (two) times daily after a meal. Patient not taking: Reported on 09/27/2016 06/04/16   Elease Etienne, MD  RABEprazole (ACIPHEX) 20 MG tablet 1 tab po bid Patient not taking: Reported on 26-Nov-2016 06/29/16   Donita Brooks, MD    Physical Exam: Vitals:   11/09/16 0030 11/09/16 0155 11/09/16 0230 11/09/16 0330  BP: 111/76 112/76 109/77 111/77  Pulse:  82 96 95  Resp: 14 13 15 14   Temp:      TempSrc:      SpO2:  100% 99% 100%  Weight:  Height:          Constitutional: Moderately built and nourished. Vitals:   11/09/16 0030 11/09/16 0155 11/09/16 0230 11/09/16 0330  BP: 111/76 112/76 109/77 111/77  Pulse:  82 96 95  Resp: 14 13 15 14   Temp:      TempSrc:      SpO2:  100% 99% 100%  Weight:      Height:       Eyes: Anicteric mild pallor. ENMT: No discharge from the ears eyes nose or mouth. Neck: No mass felt. No neck rigidity. Respiratory: No rhonchi or crepitations. Cardiovascular: S1-S2. No murmurs appreciated. Abdomen: Soft nontender bowel sounds present. Musculoskeletal: No edema. No joint effusion. Skin: Multiple skin lesions on the lower extremities. Neurologic: Patient is encephalopathic. Pupils are reacting to light. No neck rigidity. Psychiatric: Patient is encephalopathic.   Labs on Admission: I have personally reviewed following labs and imaging studies  CBC:  Recent Labs Lab 11/03/16 0357 11/06/16 0345 11/09/16 0123  WBC 4.6 5.2 5.4  NEUTROABS 1.9  --  2.4  HGB 8.9* 9.9* 9.5*  HCT 26.8* 29.0* 27.0*  MCV 101.8* 99.5 96.8  PLT 136* 116* 155   Basic Metabolic Panel:  Recent Labs Lab 11/03/16 0357 11/05/16 0519 11/09/16 0230  NA 143 140 140  K 3.6 3.1* 4.5  CL 114* 104 103  CO2 25 30 29   GLUCOSE 79 86 81  BUN 15 12  12   CREATININE 0.51 0.79 0.74  CALCIUM 7.4* 7.4* 7.4*  MG 1.9 1.7  --    GFR: Estimated Creatinine Clearance: 88.7 mL/min (by C-G formula based on SCr of 0.74 mg/dL). Liver Function Tests:  Recent Labs Lab 11/03/16 0357 11/09/16 0230  AST 40 44*  ALT 42 44  ALKPHOS 146* 257*  BILITOT 0.7 0.9  PROT 3.4* 4.0*  ALBUMIN 1.2* 1.4*   No results for input(s): LIPASE, AMYLASE in the last 168 hours.  Recent Labs Lab 11/03/16 0357 11/04/16 0508 11/05/16 0519 11/09/16 0230  AMMONIA 108* 90* 117* 249*   Coagulation Profile: No results for input(s): INR, PROTIME in the last 168 hours. Cardiac Enzymes: No results for input(s): CKTOTAL, CKMB, CKMBINDEX, TROPONINI in the last 168 hours. BNP (last 3 results) No results for input(s): PROBNP in the last 8760 hours. HbA1C: No results for input(s): HGBA1C in the last 72 hours. CBG:  Recent Labs Lab 11-14-16 2334  GLUCAP 95   Lipid Profile: No results for input(s): CHOL, HDL, LDLCALC, TRIG, CHOLHDL, LDLDIRECT in the last 72 hours. Thyroid Function Tests: No results for input(s): TSH, T4TOTAL, FREET4, T3FREE, THYROIDAB in the last 72 hours. Anemia Panel: No results for input(s): VITAMINB12, FOLATE, FERRITIN, TIBC, IRON, RETICCTPCT in the last 72 hours. Urine analysis:    Component Value Date/Time   COLORURINE YELLOW Nov 14, 2016 2324   APPEARANCEUR CLEAR 11/14/2016 2324   LABSPEC 1.011 14-Nov-2016 2324   PHURINE 8.0 Nov 14, 2016 2324   GLUCOSEU NEGATIVE 14-Nov-2016 2324   HGBUR NEGATIVE 2016/11/14 2324   BILIRUBINUR NEGATIVE 11-14-2016 2324   KETONESUR NEGATIVE 11-14-2016 2324   PROTEINUR NEGATIVE Nov 14, 2016 2324   UROBILINOGEN 1.0 06/18/2014 1316   NITRITE NEGATIVE 2016-11-14 2324   LEUKOCYTESUR NEGATIVE 2016-11-14 2324   Sepsis Labs: @LABRCNTIP (procalcitonin:4,lacticidven:4) )No results found for this or any previous visit (from the past 240 hour(s)).   Radiological Exams on Admission: Dg Chest 2 View  Result Date:  11/09/2016 CLINICAL DATA:  Altered mental status, unresponsive EXAM: CHEST  2 VIEW COMPARISON:  10/14/2016 FINDINGS: Low lung volumes. No focal  consolidation or effusion. Normal cardiomediastinal silhouette. No pneumothorax. Suspected callus formation at the mid aspect of right clavicle. Gas over the left lateral lower chest wall may relate to gas within skin folds. IMPRESSION: 1. Low lung volumes.  Negative for infiltrate or edema 2. Subacute/healing fracture of the right clavicle 3. Gas overlying the left lateral lower chest wall soft tissues, possibly related to skin folds, clinical correlation recommended Electronically Signed   By: Jasmine PangKim  Fujinaga M.D.   On: 11/09/2016 02:23     Assessment/Plan Active Problems:   Hypertension   H/O gastric bypass   Acute hepatic encephalopathy   Alcoholic cirrhosis of liver without ascites (HCC)   Hypothyroidism    1. Acute hepatic encephalopathy - patient was given a rectal lactulose. We will recheck ammonia levels. If patient becomes more alert change to by mouth lactulose otherwise will need further doses of per rectal lactulose. For now I'm keeping patient on empiric antibiotics for SBP coverage but abdomen appears benign. Med check sonogram when patient is more alert and awake. 2. Hypothyroidism - for now I have placed patient on IV Synthroid until patient can take it orally. 3. History of alcoholic liver cirrhosis - restart Lasix and spironolactone once patient can take orally. 4. Normocytic normochromic anemia appears to be chronic - follow CBC. 5. Chronic bilateral lower extremity stasis dermatitis - is on wrap. 6. Protein calorie malnutrition secondary to gastric bypass. 7. Recent admission for accidental overdose of Tylenol. Recheck Tylenol levels. We'll also check salicylate levels. 8. History of alcohol abuse presently on Thiamine.   DVT prophylaxis: SCDs. Code Status: Full code.  Family Communication: No family at the bedside.  Disposition  Plan: Skilled nursing facility.  Consults called: Wound team.  Admission status: Inpatient.    Eduard ClosKAKRAKANDY,ARSHAD N. MD [facility] Pager [phone].  If 7PM-7AM, please contact night-coverage [email or web address] Password [email or web address]  11/09/2016, 4:35 AM

## 2016-11-09 NOTE — ED Notes (Signed)
Per Lab patient will need recollect as all tubes have hemolyzed. Multiple attempts to draw lab and IV access has been completed via nursing staff, IV team, Phlebotomist, Dr. Blinda LeatherwoodPollina, and Rob, [place]. Main lab staff has been called to attempt to recollect at this time.

## 2016-11-10 ENCOUNTER — Inpatient Hospital Stay (HOSPITAL_COMMUNITY): Payer: Medicare HMO

## 2016-11-10 DIAGNOSIS — Z515 Encounter for palliative care: Secondary | ICD-10-CM

## 2016-11-10 DIAGNOSIS — K7682 Hepatic encephalopathy: Secondary | ICD-10-CM

## 2016-11-10 DIAGNOSIS — Z7189 Other specified counseling: Secondary | ICD-10-CM

## 2016-11-10 DIAGNOSIS — K7200 Acute and subacute hepatic failure without coma: Secondary | ICD-10-CM

## 2016-11-10 DIAGNOSIS — K7290 Hepatic failure, unspecified without coma: Secondary | ICD-10-CM

## 2016-11-10 LAB — COMPREHENSIVE METABOLIC PANEL
ALT: 38 U/L (ref 14–54)
AST: 27 U/L (ref 15–41)
Albumin: 1.2 g/dL — ABNORMAL LOW (ref 3.5–5.0)
Alkaline Phosphatase: 221 U/L — ABNORMAL HIGH (ref 38–126)
Anion gap: 8 (ref 5–15)
BILIRUBIN TOTAL: 1.3 mg/dL — AB (ref 0.3–1.2)
BUN: 12 mg/dL (ref 6–20)
CHLORIDE: 110 mmol/L (ref 101–111)
CO2: 24 mmol/L (ref 22–32)
CREATININE: 0.56 mg/dL (ref 0.44–1.00)
Calcium: 7.3 mg/dL — ABNORMAL LOW (ref 8.9–10.3)
GFR calc Af Amer: >60 mL/min (ref >60)
Glucose, Bld: 90 mg/dL (ref 65–99)
POTASSIUM: 3.4 mmol/L — AB (ref 3.5–5.1)
Sodium: 142 mmol/L (ref 135–145)
TOTAL PROTEIN: 3.8 g/dL — AB (ref 6.5–8.1)

## 2016-11-10 LAB — AMMONIA: Ammonia: 316 umol/L — ABNORMAL HIGH (ref 9–35)

## 2016-11-10 LAB — CBC
HEMATOCRIT: 27.3 % — AB (ref 36.0–46.0)
Hemoglobin: 9.6 g/dL — ABNORMAL LOW (ref 12.0–15.0)
MCH: 34.2 pg — ABNORMAL HIGH (ref 26.0–34.0)
MCHC: 35.2 g/dL (ref 30.0–36.0)
MCV: 97.2 fL (ref 78.0–100.0)
Platelets: 126 10*3/uL — ABNORMAL LOW (ref 150–400)
RBC: 2.81 MIL/uL — AB (ref 3.87–5.11)
RDW: 14.4 % (ref 11.5–15.5)
WBC: 7.1 10*3/uL (ref 4.0–10.5)

## 2016-11-10 LAB — GLUCOSE, CAPILLARY
GLUCOSE-CAPILLARY: 94 mg/dL (ref 65–99)
GLUCOSE-CAPILLARY: 89 mg/dL (ref 65–99)
GLUCOSE-CAPILLARY: 92 mg/dL (ref 65–99)
GLUCOSE-CAPILLARY: 86 mg/dL (ref 65–99)

## 2016-11-10 LAB — URINE CULTURE: Culture: NO GROWTH

## 2016-11-10 MED ORDER — LORAZEPAM 2 MG/ML IJ SOLN
1.0000 mg | INTRAMUSCULAR | Status: DC | PRN
  Administered 2016-11-10 (×3): 1 mg via INTRAVENOUS
  Filled 2016-11-10 (×4): qty 1

## 2016-11-10 MED ORDER — LACTULOSE 10 GM/15ML PO SOLN
30.0000 g | Freq: Three times a day (TID) | ORAL | Status: DC
  Administered 2016-11-10: 30 g via ORAL
  Filled 2016-11-10: qty 45

## 2016-11-10 MED ORDER — LACTULOSE ENEMA
300.0000 mL | Freq: Two times a day (BID) | ORAL | Status: DC
  Filled 2016-11-10 (×2): qty 300

## 2016-11-10 MED ORDER — MORPHINE SULFATE 25 MG/ML IV SOLN
5.0000 mg/h | INTRAVENOUS | Status: DC
  Administered 2016-11-10: 5 mg/h via INTRAVENOUS
  Filled 2016-11-10: qty 10

## 2016-11-10 MED ORDER — LACTULOSE 10 GM/15ML PO SOLN
30.0000 g | Freq: Three times a day (TID) | ORAL | Status: DC
  Administered 2016-11-10 (×2): 30 g
  Filled 2016-11-10 (×2): qty 45

## 2016-11-10 MED ORDER — SODIUM CHLORIDE 0.9 % IV SOLN
500.0000 mg | Freq: Two times a day (BID) | INTRAVENOUS | Status: DC
  Administered 2016-11-10 – 2016-11-11 (×3): 500 mg via INTRAVENOUS
  Filled 2016-11-10 (×4): qty 5

## 2016-11-10 MED ORDER — LORAZEPAM 2 MG/ML IJ SOLN
1.0000 mg | Freq: Once | INTRAMUSCULAR | Status: AC
  Administered 2016-11-10: 1 mg via INTRAVENOUS

## 2016-11-10 NOTE — Progress Notes (Addendum)
PROGRESS NOTE    Dorothy Dyer  ZOX:[id number] DOB: [birth year] DOA: 10/11/2016  PCP: Patient, No Pcp Per   Brief Narrative:  Pt is 41 yo female with known hx of AVN right hip, alcohol use, ? Liver cirrhosis, dermatitis of unknown etiology, multiple falls thought to be due to overdose on tylenol and Percocet, alcohol use, previous admission for hepatic and toxic encephalopathy and suspected suicidal attempt. Patient was transferred to [facility] after found to be encephalopathic, unresponsive. In ED, pt has remained unresponsive and ammonia level was noted to be 249. Was given rectal lactulose but has not responded to the treatment and essentially remained unresponsive. Brother confirmed hx of alcohol use, mother explained she is no fully aware of the entire pt's hx of substance abuse as pt has never shared any of this with her.   Assessment & Plan:   Active Problems:   Acute hepatic encephalopathy - suspect this to be related to possible cirrhosis - record review indicated previous hx of substance abuse opioids and alcohol and OD attempts - lactulose enemas provided but pt has remained encephalopathic and unresponsive - ammonia level is trending up and per recent AB [place], infiltrative hepatic process was identified but this was never fully worked up - pt was also started on Rocephin for suspected SBP, preliminary blood cultures also positive for proteus - source of proteus is not clear, rocephin should be adequate in coverage  - prognosis remains guarded, palliative care team has been consulted, brother was agreeable to DNR code status to allow pt comfort, mother also verbalized understanding and wants comfort allowed     Alcoholic cirrhosis of liver without ascites (HCC) - hx of alcohol use in terms of how much, is not clear as pt was never responsive enough to verbalize - infiltrative disease process was identified on recent US and ? Cirrhosis is certainly high in differential  - pt has  remained unresponsive and as noted above, comfort will be allowed     Proteus Bacteremia - unclear source - pt has been started on Rocephin which should be adequate coverage - discussed with pharmacy  - will follow up on final blood culture report     Hypothyroidism - TSH 8/16 was WNL ~3    Anemia and thrombocytopenia - possible connection with alcohol induced bone marrow damage - no evidence of active bleeding     Moderate protein calorie malnutrition  - chronically low albumin    DVT prophylaxis: SCD Code Status: DNR Family Communication: Brother and mother present  Disposition Plan: guarded prognosis, critically ill patient, with multiple medical conditions outlined above, guarded prognosis. PCT consultation pending.   Consultants:   PCT  Procedures:   None  Antimicrobials:   Rocephin 8/29 -->   Subjective: Remains unresponsive   Objective: Vitals:   11/10/16 0700 11/10/16 0800 11/10/16 0900 11/10/16 1000  BP: 132/83 133/80 125/82 127/82  Pulse: 91 88 88 90  Resp: 18 16 (!) 21 (!) 22  Temp:  99.2 F (37.3 C)    TempSrc:  Axillary    SpO2: 100% 100% 97% 97%  Weight:      Height:        Intake/Output Summary (Last 24 hours) at 11/10/16 1028 Last data filed at 11/10/16 0145  Gross per 24 hour  Intake                0 ml  Output             1500 ml  Net            -1500 ml   Filed Weights   10/30/2016 2209 11/09/16 0637  Weight: 81.6 kg (180 lb) 81.5 kg (179 lb 10.8 oz)    Examination:  General exam:unresponsive  Respiratory system: shallow breath sounds Cardiovascular system: S1 & S2 heard, RRR. Gastrointestinal system: Abdomen is slightly distended, soft and nontender.  Central nervous system: Unresponsive, pupils minimally reactive, hands contracted  Skin: diffuse macular type dermatitis    Data Reviewed: I have personally reviewed following labs and imaging studies  CBC:  Recent Labs Lab 11/06/16 0345 11/09/16 0123 11/10/16 0309    WBC 5.2 5.4 7.1  NEUTROABS  --  2.4  --   HGB 9.9* 9.5* 9.6*  HCT 29.0* 27.0* 27.3*  MCV 99.5 96.8 97.2  PLT 116* 155 126*   Basic Metabolic Panel:  Recent Labs Lab 11/05/16 0519 11/09/16 0230 11/10/16 0309  NA 140 140 142  K 3.1* 4.5 3.4*  CL 104 103 110  CO2 30 29 24   GLUCOSE 86 81 90  BUN 12 12 12   CREATININE 0.79 0.74 0.56  CALCIUM 7.4* 7.4* 7.3*  MG 1.7  --   --    GFR: Estimated Creatinine Clearance: 88.7 mL/min (by C-G formula based on SCr of 0.56 mg/dL). Liver Function Tests:  Recent Labs Lab 11/09/16 0230 11/10/16 0309  AST 44* 27  ALT 44 38  ALKPHOS 257* 221*  BILITOT 0.9 1.3*  PROT 4.0* 3.8*  ALBUMIN 1.4* 1.2*   No results for input(s): LIPASE, AMYLASE in the last 168 hours.  Recent Labs Lab 11/04/16 0508 11/05/16 0519 11/09/16 0230 11/10/16 0309  AMMONIA 90* 117* 249* 316*   Coagulation Profile: No results for input(s): INR, PROTIME in the last 168 hours. Cardiac Enzymes: No results for input(s): CKTOTAL, CKMB, CKMBINDEX, TROPONINI in the last 168 hours. BNP (last 3 results) No results for input(s): PROBNP in the last 8760 hours. HbA1C: No results for input(s): HGBA1C in the last 72 hours. CBG:  Recent Labs Lab 11/09/16 1202 11/09/16 1850 11/09/16 2339 11/10/16 0523 11/10/16 0738  GLUCAP 87 106* 87 94 89   Lipid Profile: No results for input(s): CHOL, HDL, LDLCALC, TRIG, CHOLHDL, LDLDIRECT in the last 72 hours. Thyroid Function Tests: No results for input(s): TSH, T4TOTAL, FREET4, T3FREE, THYROIDAB in the last 72 hours. Anemia Panel: No results for input(s): VITAMINB12, FOLATE, FERRITIN, TIBC, IRON, RETICCTPCT in the last 72 hours. Urine analysis:    Component Value Date/Time   COLORURINE YELLOW 10/15/2016 2324   APPEARANCEUR CLEAR 10/18/2016 2324   LABSPEC 1.011 10/31/2016 2324   PHURINE 8.0 10/29/2016 2324   GLUCOSEU NEGATIVE 10/17/2016 2324   HGBUR NEGATIVE 10/12/2016 2324   BILIRUBINUR NEGATIVE 11/06/2016 2324    KETONESUR NEGATIVE 10/24/2016 2324   PROTEINUR NEGATIVE 11/06/2016 2324   UROBILINOGEN 1.0 06/18/2014 1316   NITRITE NEGATIVE 10/15/2016 2324   LEUKOCYTESUR NEGATIVE 10/16/2016 2324   Recent Results (from the past 240 hour(s))  Urine culture     Status: None   Collection Time: 11/07/2016 11:24 PM  Result Value Ref Range Status   Specimen Description URINE, RANDOM  Final   Special Requests NONE  Final   Culture   Final    NO GROWTH Performed at [facility], [address] [place], [place] [address]    Report Status 11/10/2016 FINAL  Final  Culture, blood (Routine x 2)     Status: Abnormal (Preliminary result)   Collection Time: 11/09/16  1:05 AM  Result Value Ref Range Status   Specimen Description BLOOD RIGHT FEMORAL  Final   Special Requests IN PEDIATRIC BOTTLE Blood Culture adequate volume  Final   Culture  Setup Time   Final    GRAM NEGATIVE RODS IN PEDIATRIC BOTTLE CRITICAL RESULT CALLED TO, READ BACK BY AND VERIFIED WITH: J. LEGGE,PHARMD 2046 11/09/2016 T. TYSOR    Culture (A)  Final    PROTEUS MIRABILIS SUSCEPTIBILITIES TO FOLLOW Performed at [facility], [address], [place], [place] [address]    Report Status PENDING  Incomplete  Blood Culture ID Panel (Reflexed)     Status: Abnormal   Collection Time: 11/09/16  1:05 AM  Result Value Ref Range Status   Enterococcus species NOT DETECTED NOT DETECTED Final   Listeria monocytogenes NOT DETECTED NOT DETECTED Final   Staphylococcus species NOT DETECTED NOT DETECTED Final   Staphylococcus aureus NOT DETECTED NOT DETECTED Final   Streptococcus species NOT DETECTED NOT DETECTED Final   Streptococcus agalactiae NOT DETECTED NOT DETECTED Final   Streptococcus pneumoniae NOT DETECTED NOT DETECTED Final   Streptococcus pyogenes NOT DETECTED NOT DETECTED Final   Acinetobacter baumannii NOT DETECTED NOT DETECTED Final   Enterobacteriaceae species DETECTED (A) NOT DETECTED Final    Comment: Enterobacteriaceae  represent a large family of gram-negative bacteria, not a single organism. CRITICAL RESULT CALLED TO, READ BACK BY AND VERIFIED WITH: J. LEGGE,PHARMD 2046 11/09/2016 T. TYSOR    Enterobacter cloacae complex NOT DETECTED NOT DETECTED Final   Escherichia coli NOT DETECTED NOT DETECTED Final   Klebsiella oxytoca NOT DETECTED NOT DETECTED Final   Klebsiella pneumoniae NOT DETECTED NOT DETECTED Final   Proteus species DETECTED (A) NOT DETECTED Final    Comment: CRITICAL RESULT CALLED TO, READ BACK BY AND VERIFIED WITH: J. LEGGE,PHARMD 2046 11/09/2016 T. TYSOR    Serratia marcescens NOT DETECTED NOT DETECTED Final   Carbapenem resistance NOT DETECTED NOT DETECTED Final   Haemophilus influenzae NOT DETECTED NOT DETECTED Final   Neisseria meningitidis NOT DETECTED NOT DETECTED Final   Pseudomonas aeruginosa NOT DETECTED NOT DETECTED Final   Candida albicans NOT DETECTED NOT DETECTED Final   Candida glabrata NOT DETECTED NOT DETECTED Final   Candida krusei NOT DETECTED NOT DETECTED Final   Candida parapsilosis NOT DETECTED NOT DETECTED Final   Candida tropicalis NOT DETECTED NOT DETECTED Final  MRSA PCR Screening     Status: None   Collection Time: 11/09/16  6:29 AM  Result Value Ref Range Status   MRSA by PCR NEGATIVE NEGATIVE Final    Comment:        The GeneXpert MRSA Assay (FDA approved for NASAL specimens only), is one component of a comprehensive MRSA colonization surveillance program. It is not intended to diagnose MRSA infection nor to guide or monitor treatment for MRSA infections.       Radiology Studies: Dg Chest 2 View  Result Date: 11/09/2016 CLINICAL DATA:  Altered mental status, unresponsive EXAM: CHEST  2 VIEW COMPARISON:  10/14/2016 FINDINGS: Low lung volumes. No focal consolidation or effusion. Normal cardiomediastinal silhouette. No pneumothorax. Suspected callus formation at the mid aspect of right clavicle. Gas over the left lateral lower chest wall may relate to  gas within skin folds. IMPRESSION: 1. Low lung volumes.  Negative for infiltrate or edema 2. Subacute/healing fracture of the right clavicle 3. Gas overlying the left lateral lower chest wall soft tissues, possibly related to skin folds, clinical correlation recommended Electronically Signed   By:  Jasmine Pang M.D.   On: 11/09/2016 02:23   Scheduled Meds: . lactulose  300 mL Rectal BID  . levothyroxine  62.5 mcg Intravenous Daily  . mouth rinse  15 mL Mouth Rinse BID  . thiamine injection  100 mg Intravenous Daily   Continuous Infusions: . cefTRIAXone (ROCEPHIN)  IV Stopped (11/09/16 2115)     LOS: 1 day   Time spent: 35 minutes   Debbora Presto, MD [facility] Pager [phone]  If 7PM-7AM, please contact night-coverage [email or web address] Password [facility] 11/10/2016, 10:28 AM

## 2016-11-10 NOTE — Progress Notes (Signed)
Palliative paged again message left.

## 2016-11-10 NOTE — Progress Notes (Signed)
Pt brother has called other family d/t pt condition.

## 2016-11-10 NOTE — Progress Notes (Signed)
Spoke with [facility] again, who will consult CCM d/t pt status.

## 2016-11-10 NOTE — Progress Notes (Signed)
[facility] called d/t pt low b/p and high HR.  Pt is a full DNR.  Pt brother was at bedside but walked out of room. Pt remains unresponsive.

## 2016-11-10 NOTE — Consult Note (Signed)
Consultation Note Date: 11/10/2016   Patient Name: Dorothy Dyer  DOB: 11-May-1975  MRN: [id number]  Age / Sex: 41 y.o., female  PCP: Patient, No Pcp Per Referring Physician: Theodis Blaze, MD  Reason for Consultation: Establishing goals of care  HPI/Patient Profile: 41 y.o. 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  Work up revealed and elevated ammonia level, and bacteremia.  The patient has remained mostly unresponsive despite receiving lactulose.  Clinical Assessment and Goals of Care:  I have reviewed medical records including EPIC notes, labs and imaging, received report from Dr. Olen Pel, assessed the patient and then met at the bedside along with her brother Dorothy Dyer), mother and father to discuss diagnosis prognosis, [place], EOL wishes, disposition and options.  I introduced Palliative Medicine as specialized medical care for people living with serious illness. It focuses on providing relief from the symptoms and stress of a serious illness. The goal is to improve quality of life for both the patient and the family.  We discussed a brief life review of the patient. She has an 5 yo autistic son.  She has had a long difficult life with much self abuse per her brother Dorothy Dyer.  She hid much of her health condition from her mother and father.  Dorothy Dyer described the last 6 months.  The patient had very limited mobility due to pain and leg swelling.  She would go very short distances with a rolling walker.    As far as functional and nutritional status it has been progressively worsening since gastric bypass.  For the last several weeks her albumin has remained low.  It is currently  1.4  We discussed that episodes of hepatic encephalopathy tend to become more frequent and easily triggered over time.  It is likely that even if she wakes up this time - she would most likely be back in this position again shortly.  Dorothy Dyer clearly understands his sister is near end of life.  The patient's mother is still hopeful for recovery and her father is in denial and will not be part of a conversation about goals.  We discussed ensuring that Dorothy Dyer receives several doses of lactulose via n/g and then checking the ammonia level in the morning.  If it is still high and [place] remains essentially non-responsive (she opens her eyes).  Then her mother will understand that her daughter is dying of liver failure.  We discussed what end of life looks like with hepatic encephalopathy.  We also discussed decisions that will need to be made if Dorothy Dyer wakes up and has improvement in mental status.  Questions and concerns were addressed. The family was encouraged to call with questions or concerns.   PMT has decreased staffing on the weekend.  I will return on Sunday.  Please call our office [phone] if assistance is  needed before then.     Primary Decision Maker:  NEXT OF KIN Brother Dorothy Dyer    SUMMARY OF RECOMMENDATIONS    Family anticipating that Dorothy Dyer will be reassessed on Saturday to look for improvement after lactulose and antibiotics have a chance to work.  They understand she is likely at end of life.  Continue current level of care.  Brother and Mother understand that "coding" Dorothy Dyer would cause extreme trauma and likely not extend her life.  Continue DNR.  Code Status/Advance Care Planning:  DNR   Symptom Management:   Agree with lactulose.  Consider rifaximin.   Psycho-social/Spiritual:   Desire for further Chaplaincy support: yes.  Patient's pastor was present, but hospital chaplain support would be welcomed.  Prognosis:  Poor in the setting of severe malnutrition,  end stage liver disease, and bacteremia.  Limited mobility.  If she does not respond to lactulose and the family favors comfort measures - she has a prognosis of 2 weeks or less.    Discharge Planning: To Be Determined      Primary Diagnoses: Present on Admission: . Acute hepatic encephalopathy . Hypertension . Alcoholic cirrhosis of liver without ascites ([place]) . Hypothyroidism   I have reviewed the medical record, interviewed the patient and family, and examined the patient. The following aspects are pertinent.  Past Medical History:  Diagnosis Date  . Abdominal pain, chronic, generalized   . Acid reflux   . Anemia   . Anxiety   . Arthritis   . 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 Clotting disorder ([place])    undetermined  . Cystitis   . 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 Hypertension   . Neuromuscular disorder ([address])   . Osteoporosis   . Psoriasis   . Thyroid disease    Social History   Social History  . Marital status: Divorced    Spouse name: N/A  . Number of children: N/A  . Years of education: N/A   Occupational History  . disabled    Social History Main Topics  . Smoking status: Current Every Day Smoker    Packs/day: 0.50    Types: Cigarettes  . Smokeless tobacco: Never Used  . Alcohol use No     Comment: once a week  . Drug use: No  . Sexual activity: No   Other Topics Concern  . None   Social History Narrative  . None   Family History  Problem Relation Age of Onset  . Depression Mother   . Diabetes Mother   . Hyperlipidemia Mother   . Depression Father   . Hearing loss Father   . Hyperlipidemia Father   . Hypertension Father   . Learning disabilities Father   . Mental illness Father   . Asthma Brother   . Alcohol abuse Maternal Grandmother   . Arthritis Maternal Grandmother   . Cancer Maternal Grandmother   . Cancer Maternal Grandfather   . Mental illness Paternal Grandmother   . Heart disease Paternal Grandfather   .  Stroke Paternal Grandfather    Scheduled Meds: . lactulose  30 g Oral TID  . lactulose  300 mL Rectal BID  . levothyroxine  62.5 mcg Intravenous Daily  . mouth rinse  15 mL Mouth Rinse BID  . thiamine injection  100 mg Intravenous Daily   Continuous Infusions: . cefTRIAXone (ROCEPHIN)  IV Stopped (11/09/16 2115)   PRN Meds:. Allergies  Allergen Reactions  . Ibuprofen Nausea Only  and Other (See Comments)    Bad stomach pains  . Toradol [Ketorolac Tromethamine] Anxiety  . Aspirin Hives  . Cleocin [Clindamycin Hcl] Itching and Rash   Review of Systems patient obtunded.  Non-verbal  Physical Exam  Well developed young female, obtunded.  Opens eyes to her mother's voice, but does not see. CV rrr Resp very coarse wet breath sounds Abdomen distended but not tight, soft, nd  Vital Signs: BP 127/82   Pulse 90   Temp 99.2 F (37.3 C) (Axillary)   Resp (!) 22   Ht 5' 1"  (1.549 m)   Wt 81.5 kg (179 lb 10.8 oz)   SpO2 97%   BMI 33.95 kg/m  Pain Assessment: CPOT   Pain Score: Asleep   SpO2: SpO2: 97 % O2 Device:SpO2: 97 % O2 Flow Rate: .O2 Flow Rate (L/min): 1 L/min  IO: Intake/output summary:  Intake/Output Summary (Last 24 hours) at 11/10/16 1257 Last data filed at 11/10/16 0145  Gross per 24 hour  Intake                0 ml  Output             1500 ml  Net            -1500 ml    LBM: Last BM Date: 11/09/16 Baseline Weight: Weight: 81.6 kg (180 lb) Most recent weight: Weight: 81.5 kg (179 lb 10.8 oz)     Palliative Assessment/Data:   Flowsheet Rows     Most Recent Value  Intake Tab  Referral Department  Hospitalist  Unit at Time of Referral  Intermediate Care Unit  Palliative Care Primary Diagnosis  Other (Comment)  Date Notified  11/09/16  Palliative Care Type  New Palliative care  Reason for referral  Clarify Goals of Care  Date of Admission  10/18/2016  Date first seen by Palliative Care  11/10/16  # of days Palliative referral response time  1 Day(s)    # of days IP prior to Palliative referral  1  Clinical Assessment  Palliative Performance Scale Score  10%  Pain Max last 24 hours  Other (Comment)  Psychosocial & Spiritual Assessment  Palliative Care Outcomes  Patient/Family meeting held?  Yes  Who was at the meeting?  mother, father, brother, and patient (not conscious)  Palliative Care Outcomes  Clarified goals of care      Time In: 12:00 Time Out: 1:10 Time Total: 70 min. Greater than 50%  of this time was spent counseling and coordinating care related to the above assessment and plan.  Signed by: Florentina Jenny, PA-C Palliative Medicine Pager: [phone]  Please contact Palliative Medicine Team phone at [phone] for questions and concerns.  For individual provider: See Shea Evans

## 2016-11-11 DIAGNOSIS — Z789 Other specified health status: Secondary | ICD-10-CM

## 2016-11-11 DIAGNOSIS — K7030 Alcoholic cirrhosis of liver without ascites: Secondary | ICD-10-CM

## 2016-11-11 LAB — CULTURE, BLOOD (ROUTINE X 2): SPECIAL REQUESTS: ADEQUATE

## 2016-11-11 LAB — GLUCOSE, CAPILLARY
GLUCOSE-CAPILLARY: 74 mg/dL (ref 65–99)
GLUCOSE-CAPILLARY: 37 mg/dL — AB (ref 65–99)
GLUCOSE-CAPILLARY: 82 mg/dL (ref 65–99)
Glucose-Capillary: 61 mg/dL — ABNORMAL LOW (ref 65–99)
Glucose-Capillary: 39 mg/dL — CL (ref 65–99)

## 2016-11-11 LAB — AMMONIA: AMMONIA: 337 umol/L — AB (ref 9–35)

## 2016-11-11 MED ORDER — DEXTROSE 50 % IV SOLN
INTRAVENOUS | Status: AC
  Administered 2016-11-11: 50 mL
  Filled 2016-11-11: qty 50

## 2016-11-11 MED ORDER — MORPHINE BOLUS VIA INFUSION: 2.0000 mg | INTRAVENOUS | Status: DC | PRN

## 2016-11-11 MED ORDER — HALOPERIDOL LACTATE 5 MG/ML IJ SOLN: 1.0000 mg | INTRAMUSCULAR | Status: DC | PRN

## 2016-11-11 MED ORDER — GLYCOPYRROLATE 0.2 MG/ML IJ SOLN
0.2000 mg | INTRAMUSCULAR | Status: DC
  Administered 2016-11-11 (×4): 0.2 mg via INTRAVENOUS
  Filled 2016-11-11 (×7): qty 1

## 2016-11-11 MED ORDER — LORAZEPAM 2 MG/ML IJ SOLN
1.0000 mg | INTRAMUSCULAR | Status: DC
  Administered 2016-11-11 (×4): 2 mg via INTRAVENOUS
  Filled 2016-11-11 (×4): qty 1

## 2016-11-11 MED ORDER — LORAZEPAM 2 MG/ML IJ SOLN
1.0000 mg | INTRAMUSCULAR | Status: DC
  Administered 2016-11-11 (×2): 1 mg via INTRAVENOUS
  Filled 2016-11-11 (×2): qty 1

## 2016-11-11 MED ORDER — MORPHINE BOLUS VIA INFUSION
2.0000 mg | INTRAVENOUS | Status: DC | PRN
  Filled 2016-11-11: qty 2

## 2016-11-11 MED ORDER — LORAZEPAM 2 MG/ML IJ SOLN
2.0000 mg | Freq: Once | INTRAMUSCULAR | Status: AC
  Administered 2016-11-11: 2 mg via INTRAVENOUS
  Filled 2016-11-11: qty 1

## 2016-11-11 MED ORDER — MORPHINE SULFATE (PF) 2 MG/ML IV SOLN: 2.0000 mg | INTRAVENOUS | Status: DC | PRN

## 2016-11-11 MED ORDER — ARTIFICIAL TEARS OPHTHALMIC OINT
TOPICAL_OINTMENT | OPHTHALMIC | Status: DC | PRN
  Administered 2016-11-11 (×2): via OPHTHALMIC
  Filled 2016-11-11: qty 3.5

## 2016-11-11 NOTE — Progress Notes (Signed)
Multiple conversations with MD's during the night.  Pt seems comfortable now, on morphine  5 mg/hr.  Family remains at bedside, tearful but want pt comfortable.

## 2016-11-11 NOTE — Clinical Social Work Note (Signed)
SW was exiting elevator this morning and noted a female standing in the hallway crying very hard.  SW provided emotional support.  This was patient's mother and she indicated that her daughter is now on comfort care and is not expected to live.  Pt's mother relayed that family is currently in room and her daughter is unresponsive.  Encouraged mother to express her feelings of grief and loss. Per SW handoff report- [facility]. Lorri Frederickonna T. Jaci [place]az[place], [place] [phone] (weekend coverage)

## 2016-11-11 NOTE — Progress Notes (Signed)
Daily Progress Note   Patient Name: Dorothy Dyer       Date: 11/11/2016 DOB: 1975/10/27  Age: 41 y.o. MRN#: [id number] Attending Physician: Theodis Blaze, MD Primary Care Physician: Patient, No Pcp Per Admit Date: 11/03/2016  Reason for Consultation/Follow-up: Establishing goals of care, Non pain symptom management, Pain control and Terminal Care  Subjective: Dorothy Dyer condition has worsened overnight.  Now on continuous infusion of morphine and resting comfortably.  I met with family at bedside including brother and father.  Family also met with Dr. Doyle Askew this AM and understand that Dorothy Dyer.  Main goal moving forward is for her comfort as her brother reports had "rough time" last night.  Family reports she has been comfortable since starting continuous infusion and no concerns about her comfort at this time.  Length of Stay: 2  Current Medications: Scheduled Meds:  . glycopyrrolate  0.2 mg Intravenous Q4H  . LORazepam  1-2 mg Intravenous Q4H  . mouth rinse  15 mL Mouth Rinse BID    Continuous Infusions: . levETIRAcetam Stopped (11/11/16 0331)  . morphine 5 mg/hr (11/11/16 0800)    PRN Meds: haloperidol lactate, morphine  Physical Exam       General: Somnolent, in no acute distress.  HEENT: No bruits, no goiter, no JVD Heart: Tachycardic. No murmur appreciated. Lungs: Diminished with periods of apnea  Ext: + edema Skin: Warm and dry    Vital Signs: BP (!) 63/43   Pulse (!) 126   Temp (!) 104 F (40 C) (Axillary) Comment: notified RN  Resp 19   Ht _0  (1.549 m)   Wt 81.5 kg (179 lb 10.8 oz)   SpO2 100%   BMI 33.95 kg/m  SpO2: SpO2: 100 % O2 Device: O2 Device: NRB O2 Flow Rate: O2 Flow Rate (L/min): 6 L/min  Intake/output summary:    Intake/Output Summary (Last 24 hours) at 11/11/16 0950 Last data filed at 11/11/16 0800  Gross per 24 hour  Intake              381 ml  Output                0 ml  Net              381 ml   LBM: Last BM Date:  11/10/16 Baseline Weight: Weight: 81.6 kg (180 lb) Most recent weight: Weight: 81.5 kg (179 lb 10.8 oz)       Palliative Assessment/Data:    Flowsheet Rows     Most Recent Value  Intake Tab  Referral Department  Hospitalist  Unit at Time of Referral  Intermediate Care Unit  Palliative Care Primary Diagnosis  Other (Comment)  Date Notified  11/09/16  Palliative Care Type  New Palliative care  Reason for referral  Clarify Goals of Care  Date of Admission  10/27/2016  Date first seen by Palliative Care  11/10/16  # of days Palliative referral response time  1 Day(s)  # of days IP prior to Palliative referral  1  Clinical Assessment  Palliative Performance Scale Score  10%  Pain Max last 24 hours  Other (Comment)  Psychosocial & Spiritual Assessment  Palliative Care Outcomes  Patient/Family meeting held?  Yes  Who was at the meeting?  mother, father, brother, and patient (not conscious)  Palliative Care Outcomes  Clarified goals of care      Patient Active Problem List   Diagnosis Date Noted  . Hepatic encephalopathy ([place])   . Palliative care encounter   . Goals of care, counseling/discussion   . Alcoholic cirrhosis of liver without ascites ([place]) 11/09/2016  . Hypothyroidism 11/09/2016  . Acute hepatic encephalopathy 11/01/2016  . Weakness 10/29/2016  . Tylenol overdose 10/22/2016  . Pressure injury of skin 10/15/2016  . Bilateral lower leg cellulitis 10/14/2016  . Leg swelling 10/14/2016  . Pain 09/27/2016  . Clavicular fracture 09/27/2016  . Abdominal pain, chronic, generalized   . Protein-calorie malnutrition, severe 06/02/2016  . Acute encephalopathy 06/01/2016  . Chronic left hip pain 06/01/2016  . Overdose of opiate or related narcotic, accidental or  unintentional, initial encounter 06/01/2016  . Lower extremity cellulitis 06/01/2016  . 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 Left knee pain 06/01/2016  . Bilateral lower extremity edema 06/01/2016  . Acute hypokalemia 02/11/2016  . Hypoglycemia 02/11/2016  . Marijuana abuse 02/11/2016  . Chronic narcotic use 02/11/2016  . AKI (acute kidney injury) ([place]) 02/10/2016  . 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 Hoarseness 06/17/2014  . 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 Fibromyalgia 06/17/2014  . Chronic abdominal pain 06/17/2014  . Knee pain, bilateral 06/17/2014  . Vitamin D deficiency 06/17/2014  . Vitamin B12 deficiency 06/17/2014  . Iron deficiency anemia 06/17/2014  . Thyroid activity decreased 06/17/2014  . Abnormal Pap smear of cervix 06/17/2014  . Anemia   . Anxiety   . Arthritis   . Acid reflux   . Neuromuscular disorder ([place])   . Hypertension   . Osteoporosis   . HPV (human papilloma virus) infection     Palliative Care Assessment & Plan   Patient Profile: 41 y.o. female  with past medical history of advanced liver disease, malnutrition after gastric bypass, chronic anemia, AVN of the femur, fractured clavical, multiple falls and a recent tylenol overdose  who was admitted from a nursing facility on 10/26/2016 with increasing confusion.  Work up revealed and elevated ammonia level, and bacteremia.  Her condition has worsened overnight and now on morphine continuous and resting comfortably.  Assessment: Dorothy Dyer is actively Dyer  Recommendations/Plan:  Dorothy Dyer is actively Dyer.  Plan is full comfort care.  I updated orders with a focus on strict comfort care.  Discontinued abx, CBG checks, and lactulose.  Made addition of haldol, robinul, and  bolus of morphine as needed in addition to her continuous infusion.  Discussed taking off monitor, facemask, NG tube with family.  She is resting comfortably at this time and they do not want to  disturb her at this point.  I let family and her RN know that any or all of these may be taken off at any point if needed for her comfort.  Provided anticipatory counseling on what to expect moving forward as she progresses in process of Dyer.  Goals of Care and Additional Recommendations:  Limitations on Scope of Treatment: Full Comfort Care  Code Status:    Code Status Orders        Start     Ordered   11/10/16 0937  Do not attempt resuscitation (DNR)  Continuous    Question Answer Comment  In the event of cardiac or respiratory ARREST Do not call a "code blue"   In the event of cardiac or respiratory ARREST Do not perform Intubation, CPR, defibrillation or ACLS   In the event of cardiac or respiratory ARREST Use medication by any route, position, wound care, and other measures to relive pain and suffering. May use oxygen, suction and manual treatment of airway obstruction as needed for comfort.      11/10/16 0936    Code Status History    Date Active Date Inactive Code Status Order ID Comments User Context   11/09/2016  4:32 AM 11/10/2016  9:36 AM Full Code [id number]  Rise Patience, MD ED   10/30/2016  1:59 AM 11/07/2016  6:23 PM Full Code [id number]  [place], [place], DO Inpatient   10/22/2016  6:25 PM 10/26/2016  4:00 PM Full Code [id number]  Vashti Hey, MD Inpatient   10/22/2016 12:27 PM 10/22/2016  6:25 PM Full Code [id number]  Deno Etienne, DO ED   10/14/2016  9:19 PM 10/16/2016  3:42 PM Full Code [id number]  Theodoro Grist, MD Inpatient   09/27/2016  6:11 PM 09/29/2016  9:00 PM Full Code [id number]  Vaughan Basta, MD Inpatient   06/01/2016  6:34 PM 06/03/2016 10:42 PM Full Code [id number]  Samella Parr, NP Inpatient   02/11/2016  2:22 AM 02/11/2016 10:15 PM Full Code [id number]  Rise Patience, MD Inpatient       Prognosis:   Hours - Days  Discharge Planning:  Anticipated Hospital Death  Care plan was discussed with family, bedside RN, Dr.  Doyle Askew  Thank you for allowing the Palliative Medicine Team to assist in the care of this patient.   Time In: 0900 Time Out: 0930 Total Time 30 Prolonged Time Billed No      Greater than 50%  of this time was spent counseling and coordinating care related to the above assessment and plan.  Micheline Rough, MD  Please contact Palliative Medicine Team phone at [phone] for questions and concerns.

## 2016-11-11 NOTE — Progress Notes (Signed)
Pt cbg remains low after D50 given x2 more times.  Day RN will f/u.

## 2016-11-11 NOTE — Progress Notes (Signed)
   11/11/16 1500  Clinical Encounter Type  Visited With Patient;Patient and family together;Health care provider  Visit Type Initial;Follow-up;Psychological support;Spiritual support;Critical Care;Patient actively dying  Referral From Nurse;Family  Consult/Referral To Chaplain;Faith community;Palliative care  Spiritual Encounters  Spiritual Needs Emotional;Grief support;Prayer  Stress Factors  Patient Stress Factors None identified  Family Stress Factors Exhausted;Loss of control;Major life changes  Chaplain was contacted Friday at the request from RN. When chaplain arrived Pt was agitated. Facial expression was not relaxed. Both brother and mother at bedside and was opened to the visit. There isn't a church or outside pastoral support. When asked "what would be helpful?" family said prayer-- Chaplain provided prayer. The main concern from the family was to get the Pt comfortable. Chaplain sat with family as the RN worked hard to get in contact with hospitalist for assessment and medication management.  Pt was given morphine and other meds however when Chaplain left the RN was still working hard to manage the Pt's symptoms. The RN advocated for Pt and family throughout her shift in hopes that the Pt's plan of care would be reviewed.   It's evident that the RN succeeded because when Chaplain followed up the next day she found the Pt more relaxed. RNs are still watching Pt closely to ensure her comfort. Chaplain sat with Pt for a bit as the family took a break and went to get food. Chaplain explored with family their fears and hopes regarding the Pts condition. Chaplain listened empathically and provided a supportive presence.   Please, if this family seems to be in more need for emotional or spiritual support please don't hesitate to page the chaplain.    ThanksCorwin Levins,   Briscoe Daniello  [phone]

## 2016-11-11 NOTE — Progress Notes (Signed)
Hypoglycemic Event  CBG: 61  Treatment:  D50 25ml  Symptoms: uta  Follow-up CBG: Time: CBG Result: 74  Possible Reasons for Event:  Poor po intake  Comments/MD notified:[facility]    Conley RollsGarland, Donnah Levert [place]

## 2016-11-11 NOTE — Progress Notes (Signed)
PROGRESS NOTE  Dorothy Dyer  ONG:[id number] DOB: [birth year] DOA: 10/18/2016  PCP: Patient, No Pcp Per   Brief Narrative:  Pt is 41 yo female with known hx of AVN right hip, alcohol use, ? Liver cirrhosis, dermatitis of unknown etiology, gastric bypass about 20 years ago but was never compliant with recommended medications, multiple falls thought to be due to overdose on tylenol and Percocet, alcohol use, previous admission for hepatic and toxic encephalopathy and suspected suicidal attempt. Patient was transferred to [facility] after found to be encephalopathic, unresponsive. In ED, pt has remained unresponsive and ammonia level was noted to be 249. Was given rectal lactulose but has not responded to the treatment and essentially remained unresponsive. Brother confirmed hx of alcohol use, mother explained she is no fully aware of the entire pt's hx of substance abuse as pt has never shared any of this with her.   Assessment & Plan:   Active Problems:   Acute hepatic encephalopathy - suspect this to be related to possible cirrhosis, alcohol and ? NASH - record review indicated previous hx of substance abuse opioids and alcohol and OD attempts - lactulose enemas provided but pt has remained encephalopathic and unresponsive - ammonia level has trended up despite lactulose - ABD [place] from 8/14 reviewed with family, suggestive of an infiltrative fatty liver disease, ? Alcoholic cirrhosis and even NASH - this was never worked up to evaluate clear etiology  - pt was also started on Rocephin for suspected SBP, preliminary blood cultures also positive for proteus - source of proteus is not clear, rocephin should have been adequate in coverage but pt has not responded clinically to this medical intervention - pt continued to decline and family now in full agreement with allowing patient more comfort    Alcoholic cirrhosis of liver without ascites (HCC), ? NASH - hx of alcohol use in terms of how much, is  not clear as pt was never responsive enough to verbalize - infiltrative disease process was identified on recent US and ? Cirrhosis is certainly high in differential  - pt was morbidly obese in the past and has underwent gastric bypass years ago, there is a ? Of NASH as well but this was never worked up per mother's report - pt has been somewhat distant in disclosing her medical issues with family and therefore family is not aware of much of the medical issues that pt herself had  - pt still non responsive    Twitching and jerking motion - suspected seizure in the setting of acute hepatic encephalopathy - ativan on board, keppra also give but it has not helped like ativan  - family now in agreement with full comfort     Proteus Bacteremia - unclear source, ? SBP - pt has been started on Rocephin and per sensitivity report Rocephin should be adequate  - this treatment has also not helped with improvement in clinical status     Hypothyroidism - TSH 8/16 was WNL ~3    Anemia and thrombocytopenia - possible connection with alcohol induced bone marrow damage    Moderate protein calorie malnutrition  - chronically low albumin likely consequence of gastric bypass and medical non compliance    DVT prophylaxis: SCD Code Status: DNR Family Communication: family at bedside  Disposition Plan: allowing full comfort   Consultants:   PCT  Procedures:   None  Antimicrobials:   Rocephin 8/29 -->  Subjective:  Remains unresponsive.   Objective: Vitals:   11/11/16 0444 11/11/16  0544 11/11/16 0730 11/11/16 0800  BP: (!) 74/41 (!) 77/42  (!) 63/43  Pulse: (!) 133 (!) 130  (!) 126  Resp: (!) 23 (!) 23  19  Temp:   (!) 104 F (40 C)   TempSrc:   Axillary   SpO2: 100% 100%  100%  Weight:      Height:        Intake/Output Summary (Last 24 hours) at 11/11/16 1015 Last data filed at 11/11/16 1000  Gross per 24 hour  Intake              411 ml  Output                0 ml  Net               411 ml   Filed Weights   10/12/2016 2209 11/09/16 0637  Weight: 81.6 kg (180 lb) 81.5 kg (179 lb 10.8 oz)   Physical Exam  Constitutional: Still unresponsive, NAD CVS: tachycardic, no murmurs  Pulmonary: gargling sounds, shallow breaths  Abdominal: Soft. Slightly distended  Neuro: unresponsive   Data Reviewed: I have personally reviewed following labs and imaging studies  CBC:  Recent Labs Lab 11/06/16 0345 11/09/16 0123 11/10/16 0309  WBC 5.2 5.4 7.1  NEUTROABS  --  2.4  --   HGB 9.9* 9.5* 9.6*  HCT 29.0* 27.0* 27.3*  MCV 99.5 96.8 97.2  PLT 116* 155 126*   Basic Metabolic Panel:  Recent Labs Lab 11/05/16 0519 11/09/16 0230 11/10/16 0309  NA 140 140 142  K 3.1* 4.5 3.4*  CL 104 103 110  CO2 30 29 24   GLUCOSE 86 81 90  BUN 12 12 12   CREATININE 0.79 0.74 0.56  CALCIUM 7.4* 7.4* 7.3*  MG 1.7  --   --    Liver Function Tests:  Recent Labs Lab 11/09/16 0230 11/10/16 0309  AST 44* 27  ALT 44 38  ALKPHOS 257* 221*  BILITOT 0.9 1.3*  PROT 4.0* 3.8*  ALBUMIN 1.4* 1.2*    Recent Labs Lab 11/05/16 0519 11/09/16 0230 11/10/16 0309 11/11/16 0346  AMMONIA 117* 249* 316* 337*   CBG:  Recent Labs Lab 11/11/16 0126 11/11/16 0221 11/11/16 0525 11/11/16 0644 11/11/16 0740  GLUCAP 61* 74 37* 39* 82   Urine analysis:    Component Value Date/Time   COLORURINE YELLOW 10/14/2016 2324   APPEARANCEUR CLEAR 10/27/2016 2324   LABSPEC 1.011 11/05/2016 2324   PHURINE 8.0 10/20/2016 2324   GLUCOSEU NEGATIVE 10/28/2016 2324   HGBUR NEGATIVE 10/23/2016 2324   BILIRUBINUR NEGATIVE 10/24/2016 2324   KETONESUR NEGATIVE 10/28/2016 2324   PROTEINUR NEGATIVE 10/11/2016 2324   UROBILINOGEN 1.0 06/18/2014 1316   NITRITE NEGATIVE 11/02/2016 2324   LEUKOCYTESUR NEGATIVE 10/26/2016 2324   Recent Results (from the past 240 hour(s))  Urine culture     Status: None   Collection Time: 10/21/2016 11:24 PM  Result Value Ref Range Status   Specimen Description  URINE, RANDOM  Final   Special Requests NONE  Final   Culture   Final    NO GROWTH Performed at [facility], [address] [place], [place] [address]    Report Status 11/10/2016 FINAL  Final  Culture, blood (Routine x 2)     Status: Abnormal   Collection Time: 11/09/16  1:05 AM  Result Value Ref Range Status   Specimen Description BLOOD RIGHT FEMORAL  Final   Special Requests IN PEDIATRIC BOTTLE Blood Culture  adequate volume  Final   Culture  Setup Time   Final    GRAM NEGATIVE RODS IN PEDIATRIC BOTTLE CRITICAL RESULT CALLED TO, READ BACK BY AND VERIFIED WITH: J. LEGGE,PHARMD 2046 11/09/2016 T. TYSOR    Culture PROTEUS MIRABILIS (A)  Final   Report Status 11/11/2016 FINAL  Final   Organism ID, Bacteria PROTEUS MIRABILIS  Final      Susceptibility   Proteus mirabilis - MIC*    AMPICILLIN <=2 SENSITIVE Sensitive     CEFAZOLIN <=4 SENSITIVE Sensitive     CEFEPIME <=1 SENSITIVE Sensitive     CEFTAZIDIME <=1 SENSITIVE Sensitive     CEFTRIAXONE <=1 SENSITIVE Sensitive     CIPROFLOXACIN <=0.25 SENSITIVE Sensitive     GENTAMICIN <=1 SENSITIVE Sensitive     IMIPENEM 8 INTERMEDIATE Intermediate     TRIMETH/SULFA <=20 SENSITIVE Sensitive     AMPICILLIN/SULBACTAM <=2 SENSITIVE Sensitive     PIP/TAZO <=4 SENSITIVE Sensitive     * PROTEUS MIRABILIS  Blood Culture ID Panel (Reflexed)     Status: Abnormal   Collection Time: 11/09/16  1:05 AM  Result Value Ref Range Status   Enterococcus species NOT DETECTED NOT DETECTED Final   Listeria monocytogenes NOT DETECTED NOT DETECTED Final   Staphylococcus species NOT DETECTED NOT DETECTED Final   Staphylococcus aureus NOT DETECTED NOT DETECTED Final   Streptococcus species NOT DETECTED NOT DETECTED Final   Streptococcus agalactiae NOT DETECTED NOT DETECTED Final   Streptococcus pneumoniae NOT DETECTED NOT DETECTED Final   Streptococcus pyogenes NOT DETECTED NOT DETECTED Final   Acinetobacter baumannii NOT DETECTED NOT DETECTED Final    Enterobacteriaceae species DETECTED (A) NOT DETECTED Final    Comment: Enterobacteriaceae represent a large family of gram-negative bacteria, not a single organism. CRITICAL RESULT CALLED TO, READ BACK BY AND VERIFIED WITH: J. LEGGE,PHARMD 2046 11/09/2016 T. TYSOR    Enterobacter cloacae complex NOT DETECTED NOT DETECTED Final   Escherichia coli NOT DETECTED NOT DETECTED Final   Klebsiella oxytoca NOT DETECTED NOT DETECTED Final   Klebsiella pneumoniae NOT DETECTED NOT DETECTED Final   Proteus species DETECTED (A) NOT DETECTED Final    Comment: CRITICAL RESULT CALLED TO, READ BACK BY AND VERIFIED WITH: J. LEGGE,PHARMD 2046 11/09/2016 T. TYSOR    Serratia marcescens NOT DETECTED NOT DETECTED Final   Carbapenem resistance NOT DETECTED NOT DETECTED Final   Haemophilus influenzae NOT DETECTED NOT DETECTED Final   Neisseria meningitidis NOT DETECTED NOT DETECTED Final   Pseudomonas aeruginosa NOT DETECTED NOT DETECTED Final   Candida albicans NOT DETECTED NOT DETECTED Final   Candida glabrata NOT DETECTED NOT DETECTED Final   Candida krusei NOT DETECTED NOT DETECTED Final   Candida parapsilosis NOT DETECTED NOT DETECTED Final   Candida tropicalis NOT DETECTED NOT DETECTED Final  Culture, blood (Routine x 2)     Status: None (Preliminary result)   Collection Time: 11/09/16  1:16 AM  Result Value Ref Range Status   Specimen Description BLOOD LEFT ARM  Final   Special Requests   Final    BOTTLES DRAWN AEROBIC AND ANAEROBIC Blood Culture adequate volume   Culture   Final    NO GROWTH 1 DAY Performed at [facility], [address] [place], [place] [address]    Report Status PENDING  Incomplete  MRSA PCR Screening     Status: None   Collection Time: 11/09/16  6:29 AM  Result Value Ref Range Status   MRSA by PCR NEGATIVE NEGATIVE Final  Comment:        The GeneXpert MRSA Assay (FDA approved for NASAL specimens only), is one component of a comprehensive MRSA  colonization surveillance program. It is not intended to diagnose MRSA infection nor to guide or monitor treatment for MRSA infections.       Radiology Studies: Dg Abd 1 View  Result Date: 11/10/2016 CLINICAL DATA:  NG tube placement. EXAM: ABDOMEN - 1 VIEW COMPARISON:  Abdominal x-ray dated July 09, 2014. FINDINGS: Nasogastric tube looped back on itself in the gastric fundus. Large stool ball in the rectum. The bowel gas pattern is normal. No radio-opaque calculi or other significant radiographic abnormality are seen. Cholecystectomy. IMPRESSION: 1. Nasogastric tube looped back on itself in the gastric fundus. 2. Large stool ball in the rectum.  Correlate for fecal impaction. Electronically Signed   By: Obie Dredge M.D.   On: 11/10/2016 13:39   Scheduled Meds: . glycopyrrolate  0.2 mg Intravenous Q4H  . LORazepam  1-2 mg Intravenous Q4H  . mouth rinse  15 mL Mouth Rinse BID   Continuous Infusions: . levETIRAcetam Stopped (11/11/16 0331)  . morphine 5 mg/hr (11/11/16 0800)     LOS: 2 days   Time spent: 35 minutes   Debbora Presto, MD [facility] Pager [phone]  If 7PM-7AM, please contact night-coverage [email or web address] Password [email or web address] 11/11/2016, 10:15 AM

## 2016-11-11 DEATH — deceased

## 2016-11-14 LAB — CULTURE, BLOOD (ROUTINE X 2)
Culture: NO GROWTH
SPECIAL REQUESTS: ADEQUATE

## 2016-11-24 ENCOUNTER — Ambulatory Visit: Payer: Self-pay | Admitting: Family Medicine

## 2016-12-11 NOTE — Progress Notes (Signed)
Pt has no spontaneous breath sounds, no heart sounds auscultated, pt asystole on monitor. Family at bedside.  MD paged. Two RN's to pronounce [name], RN and myself.

## 2016-12-11 NOTE — Discharge Summary (Signed)
Death Summary  Dorothy Dyer ZOX:[id number] DOB: 05/15/1975 DOA: 2016/11/19  PCP: Patient, No Pcp Per  Admit date: 11-19-16 Date of Death: 11/23/2016 Time of Death: 3:46 am  History of present illness:  Pt is 41 yo female with known hx of AVN right hip, alcohol use, ? Liver cirrhosis, dermatitis of unknown etiology, gastric bypass about 20 years ago but was never compliant with recommended medications, multiple falls thought to be due to overdose on tylenol and Percocet, alcohol use, previous admission for hepatic and toxic encephalopathy and suspected suicidal attempt. Patient was transferred to [facility] after found to be encephalopathic, unresponsive. In ED, pt has remained unresponsive and ammonia level was noted to be 249. Was given rectal lactulose but has not responded to the treatment and essentially remained unresponsive. Brother confirmed hx of alcohol use, mother explained she is no fully aware of the entire pt's hx of substance abuse as pt has never shared any of this with her.   Assessment & Plan:   Active Problems:   Acute hepatic encephalopathy - suspect this to be related to possible cirrhosis, alcohol and ? NASH - record review indicated previous hx of substance abuse opioids and alcohol and OD attempts - lactulose enemas provided but pt has remained encephalopathic and unresponsive - ammonia level has trended up despite lactulose - ABD [place] from 8/14 reviewed with family, suggestive of an infiltrative fatty liver disease, ? Alcoholic cirrhosis and even NASH - this was never worked up to evaluate clear etiology  - pt was also started on Rocephin for suspected SBP, preliminary blood cultures also positive for proteus - source of proteus is not clear, rocephin should have been adequate in coverage but pt has not responded clinically to this medical intervention - Patient continued to decline, family asked to ensure full comfort, patient was started on morphine drip - Patient passed  away Nov 23, 2016     Alcoholic cirrhosis of liver without ascites (HCC), ? NASH - hx of alcohol use in terms of how much, is not clear as pt was never responsive enough to verbalize - infiltrative disease process was identified on recent US and ? Cirrhosis is certainly high in differential  - pt was morbidly obese in the past and has underwent gastric bypass years ago, there is a ? Of NASH as well but this was never worked up per mother's report - pt has been somewhat distant in disclosing her medical issues with family and therefore family is not aware of much of the medical issues that pt herself had  - patient has remained unresponsive despite medical measures outlined above - Transitioned to full comfort per family wishes    Twitching and jerking motion - suspected seizure in the setting of acute hepatic encephalopathy - ativan on board, keppra also give but it has not helped like ativan  - comfort care allowed    Proteus Bacteremia - unclear source, ? SBP - pt has been started on Rocephin and per sensitivity report Rocephin should be adequate  - patient has not responded to medical regimen    Hypothyroidism - TSH 8/16 was WNL ~3    Anemia and thrombocytopenia - possible connection with alcohol induced bone marrow damage    Moderate protein calorie malnutrition  - chronically low albumin likely consequence of gastric bypass and medical non compliance   DVT prophylaxis: SCD Code Status: DNR  Consultants:   PCT  Procedures:   None  Antimicrobials:   Rocephin 11-20-2022 -->  The results of significant  diagnostics from this hospitalization (including imaging, microbiology, ancillary and laboratory) are listed below for reference.    Significant Diagnostic Studies: Dg Chest 2 View  Result Date: 11/09/2016 CLINICAL DATA:  Altered mental status, unresponsive EXAM: CHEST  2 VIEW COMPARISON:  10/14/2016 FINDINGS: Low lung volumes. No focal consolidation or effusion.  Normal cardiomediastinal silhouette. No pneumothorax. Suspected callus formation at the mid aspect of right clavicle. Gas over the left lateral lower chest wall may relate to gas within skin folds. IMPRESSION: 1. Low lung volumes.  Negative for infiltrate or edema 2. Subacute/healing fracture of the right clavicle 3. Gas overlying the left lateral lower chest wall soft tissues, possibly related to skin folds, clinical correlation recommended Electronically Signed   By: Jasmine PangKim  Fujinaga M.D.   On: 11/09/2016 02:23   Dg Abd 1 View  Result Date: 11/10/2016 CLINICAL DATA:  NG tube placement. EXAM: ABDOMEN - 1 VIEW COMPARISON:  Abdominal x-ray dated July 09, 2014. FINDINGS: Nasogastric tube looped back on itself in the gastric fundus. Large stool ball in the rectum. The bowel gas pattern is normal. No radio-opaque calculi or other significant radiographic abnormality are seen. Cholecystectomy. IMPRESSION: 1. Nasogastric tube looped back on itself in the gastric fundus. 2. Large stool ball in the rectum.  Correlate for fecal impaction. Electronically Signed   By: Obie DredgeWilliam T Derry M.D.   On: 11/10/2016 13:39   Ct Humerus Right W Contrast  Result Date: 10/24/2016 CLINICAL DATA:  Right arm swelling and erythema with contracture of the right hand in the setting of a right clavicular fracture and right radial nerve palsy. Concern for infection or compartment syndrome. EXAM: CT OF THE UPPER RIGHT EXTREMITY WITH CONTRAST TECHNIQUE: Multidetector CT imaging of the right humerus and forearm was performed according to the standard protocol following intravenous contrast administration. COMPARISON:  Right humerus x-rays dated August 23, 2016. CONTRAST:  100mL ISOVUE-300 IOPAMIDOL (ISOVUE-300) INJECTION 61% FINDINGS: Bones/Joint/Cartilage There is a subacute, comminuted fracture of the midclavicle with approximately 1 shaft bone width posterior displacement of the distal fragment. There is surrounding callus formation. No  additional fractures.  No glenohumeral or elbow joint effusion. Ligaments Suboptimally assessed by CT. Muscles and Tendons Calcification adjacent to the posterior greater tuberosity in the region of the distal infraspinatus tendon. Soft tissues Diffuse anasarca in the visualized soft tissues. No drainable fluid collection. There is mild mass effect on the right subclavian vein as it passes behind the healing clavicle fracture. The vein appears patent. No axillary lymphadenopathy.  The visualized right lung is clear. IMPRESSION: 1. Diffuse anasarca in the visualized soft tissues, including the right arm and right chest and abdominal wall. No discrete drainable fluid collection is seen. Please note that the diagnosis of compartment syndrome cannot be established by imaging. 2. Subacute, healing comminuted fracture of the mid clavicle with approximately 1 shaft bone width posterior displacement of the distal fragment. 3. Mild mass effect on the right subclavian vein as it passes behind the healing clavicle fracture. The vein appears patent. If there is clinical concern for DVT, recommend further evaluation with ultrasound. 4. Probable calcific tendinitis of the infraspinatus tendon. Electronically Signed   By: Obie DredgeWilliam T Derry M.D.   On: 10/24/2016 08:25   Ct Forearm Right W Contrast  Result Date: 10/24/2016 CLINICAL DATA:  Right arm swelling and erythema with contracture of the right hand in the setting of a right clavicular fracture and right radial nerve palsy. Concern for infection or compartment syndrome. EXAM: CT OF THE UPPER RIGHT EXTREMITY  WITH CONTRAST TECHNIQUE: Multidetector CT imaging of the right humerus and forearm was performed according to the standard protocol following intravenous contrast administration. COMPARISON:  Right humerus x-rays dated August 23, 2016. CONTRAST:  ISOVUE-300 IOPAMIDOL (ISOVUE-300) INJECTION 61% FINDINGS: Bones/Joint/Cartilage There is a subacute, comminuted fracture of  the midclavicle with approximately 1 shaft bone width posterior displacement of the distal fragment. There is surrounding callus formation. No additional fractures.  No glenohumeral or elbow joint effusion. Ligaments Suboptimally assessed by CT. Muscles and Tendons Calcification adjacent to the posterior greater tuberosity in the region of the distal infraspinatus tendon. Soft tissues Diffuse anasarca in the visualized soft tissues. No drainable fluid collection. There is mild mass effect on the right subclavian vein as it passes behind the healing clavicle fracture. The vein appears patent. No axillary lymphadenopathy.  The visualized right lung is clear. IMPRESSION: 1. Diffuse anasarca in the visualized soft tissues, including the right arm and right chest and abdominal wall. No discrete drainable fluid collection is seen. Please note that the diagnosis of compartment syndrome cannot be established by imaging. 2. Subacute, healing comminuted fracture of the mid clavicle with approximately 1 shaft bone width posterior displacement of the distal fragment. 3. Mild mass effect on the right subclavian vein as it passes behind the healing clavicle fracture. The vein appears patent. If there is clinical concern for DVT, recommend further evaluation with ultrasound. 4. Probable calcific tendinitis of the infraspinatus tendon. Electronically Signed   By: Obie Dredge M.D.   On: 10/24/2016 08:25   US Venous Img Upper Uni Right  Result Date: 11/03/2016 CLINICAL DATA:  EDEMA OF THE RIGHT ARM. EXAM: RIGHT UPPER EXTREMITY VENOUS DOPPLER ULTRASOUND TECHNIQUE: Gray-scale sonography with graded compression, as well as color Doppler and duplex ultrasound were performed to evaluate the upper extremity deep venous system from the level of the subclavian vein and including the jugular, axillary, basilic, radial, ulnar and upper cephalic vein. Spectral Doppler was utilized to evaluate flow at rest and with distal augmentation  maneuvers. COMPARISON:  None. FINDINGS: Contralateral Subclavian Vein: Respiratory phasicity is normal and symmetric with the symptomatic side. No evidence of thrombus. Normal compressibility. Internal Jugular Vein: No evidence of thrombus. Normal compressibility, respiratory phasicity and response to augmentation. Subclavian Vein: No evidence of thrombus. Normal compressibility, respiratory phasicity and response to augmentation. Axillary Vein: No evidence of thrombus. Normal compressibility, respiratory phasicity and response to augmentation. Cephalic Vein: No evidence of thrombus. Normal compressibility, respiratory phasicity and response to augmentation. Basilic Vein: Limited visualization due to edema. No evidence of thrombus. Brachial Veins: No evidence of thrombus. Normal compressibility, respiratory phasicity and response to augmentation. Radial Veins: No evidence of thrombus. Normal compressibility, respiratory phasicity and response to augmentation. Ulnar Veins: No evidence of thrombus. Normal compressibility, respiratory phasicity and response to augmentation. Venous Reflux:  None visualized. Other Findings:  None visualized. IMPRESSION: No evidence of DVT within the right upper extremity. Electronically Signed   By: Francene Boyers M.D.   On: 11/03/2016 15:27   US Abdomen Limited Ruq  Result Date: 10/24/2016 CLINICAL DATA:  Right upper quadrant pain. EXAM: ULTRASOUND ABDOMEN LIMITED RIGHT UPPER QUADRANT COMPARISON:  MRI 06/01/2016.  CT 06/18/2014 . FINDINGS: Gallbladder: Cholecystectomy. Common bile duct: Diameter: 6.7 mm Liver: Increased echogenicity of the liver noted consistent fatty infiltration and/or hepatocellular disease. A 1.1 cm simple cyst in the left hepatic lobe. Questionable very mild ascites. Tiny right pleural effusion. IMPRESSION: 1. Cholecystectomy.  No biliary distention. 2. Increased hepatic echogenicity consistent fatty infiltration  and/or hepatocellular disease. Questions mild  ascites. Small right pleural effusion. Electronically Signed   By: Maisie Fus  Register   On: 10/24/2016 10:05    Urinalysis    Component Value Date/Time   COLORURINE YELLOW 10/26/2016 2324   APPEARANCEUR CLEAR 10/14/2016 2324   LABSPEC 1.011 10/21/2016 2324   PHURINE 8.0 10/30/2016 2324   GLUCOSEU NEGATIVE 10/20/2016 2324   HGBUR NEGATIVE 10/13/2016 2324   BILIRUBINUR NEGATIVE 10/31/2016 2324   KETONESUR NEGATIVE 10/14/2016 2324   PROTEINUR NEGATIVE 10/18/2016 2324   UROBILINOGEN 1.0 06/18/2014 1316   NITRITE NEGATIVE 10/26/2016 2324   LEUKOCYTESUR NEGATIVE 10/22/2016 2324   SIGNED:  Debbora Presto, MD  [facility] 11/22/2016, 1:49 PM Pager [phone]  If 7PM-7AM, please contact night-coverage [email or web address] Password [email or web address]

## 2016-12-11 NOTE — Progress Notes (Signed)
Morphine 250 mg in NACL 0.9% 250 mL (1mg /mL) wasted 100 mL in sink with [place],RN.

## 2016-12-11 DEATH — deceased

## 2017-01-04 ENCOUNTER — Other Ambulatory Visit: Payer: Self-pay | Admitting: Family Medicine

## 2018-03-21 IMAGING — US US ABDOMEN LIMITED
1 series · 14 of 25 positions shown · non-contrast
Comparison: MRI 06/01/2016.  CT 06/18/2014 .

CLINICAL DATA: Right upper quadrant pain.

EXAM:
ULTRASOUND ABDOMEN LIMITED RIGHT UPPER QUADRANT

[Series 1: us abdomen limited · 0.25mm/px · 14 of 51 slices shown]
[im 1/51]
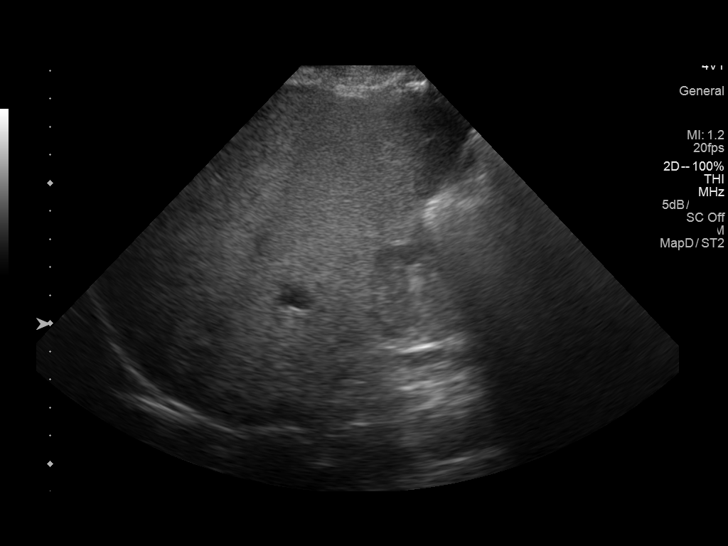
[im 5/51]
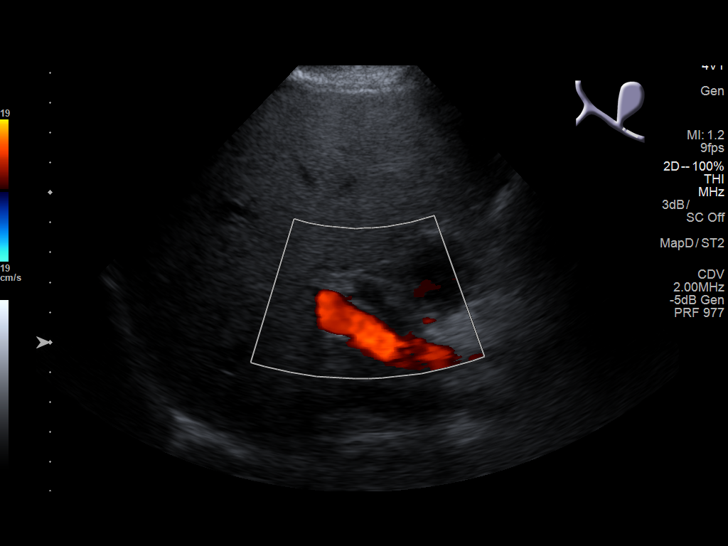
[im 9/51]
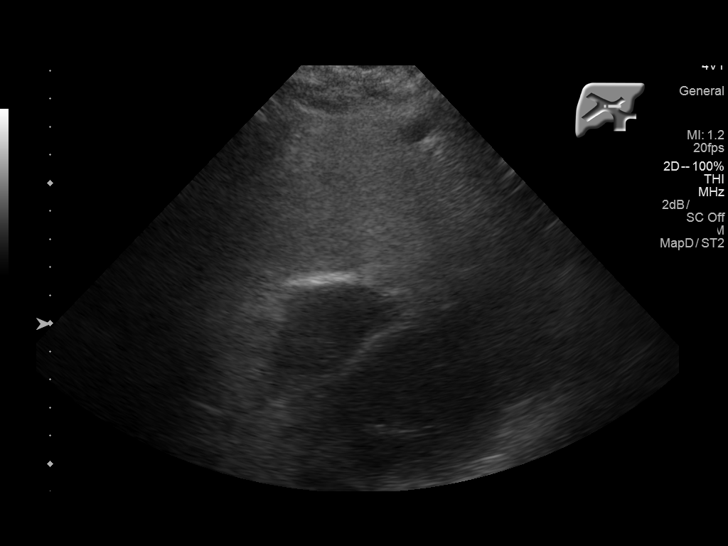
[im 13/51]
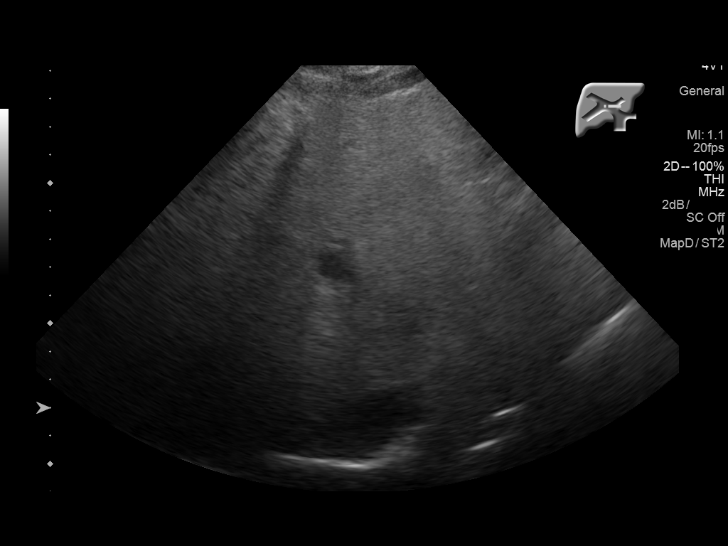
[im 17/51]
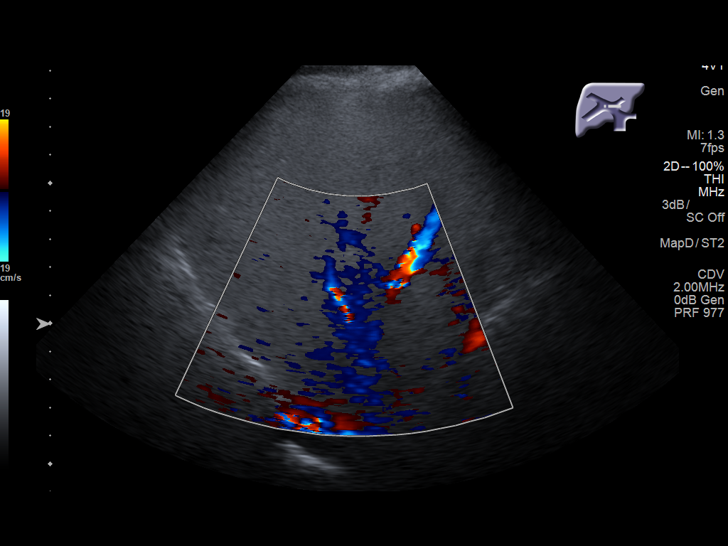
[im 19/51]
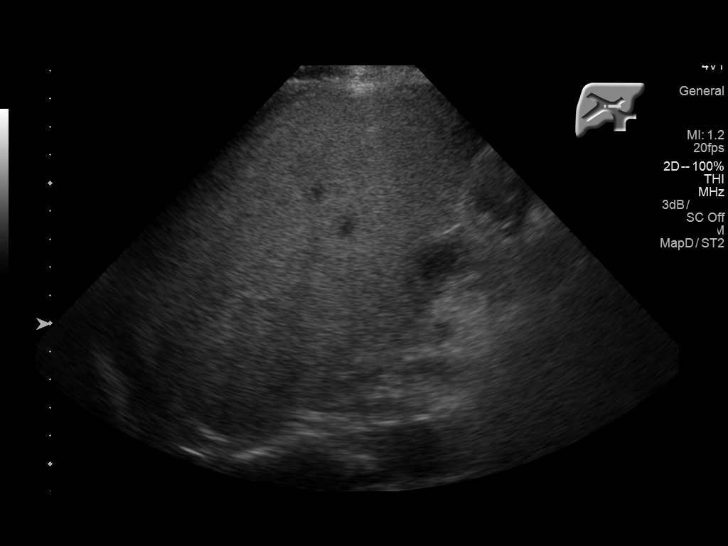
[im 23/51]
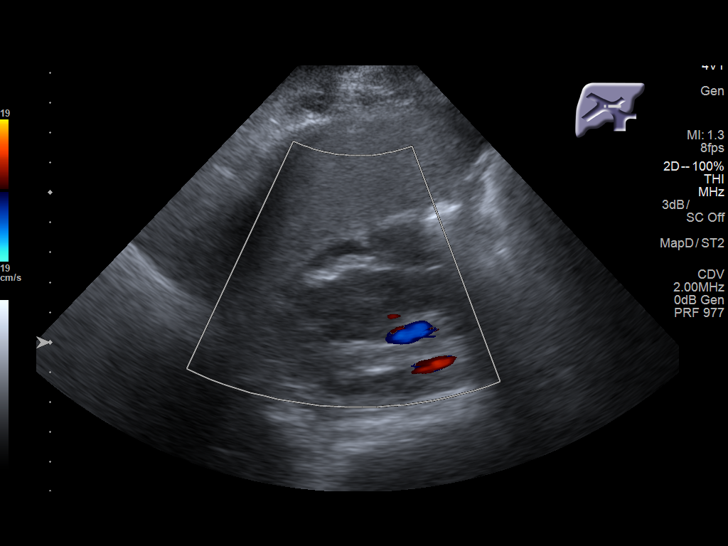
[im 28/51]
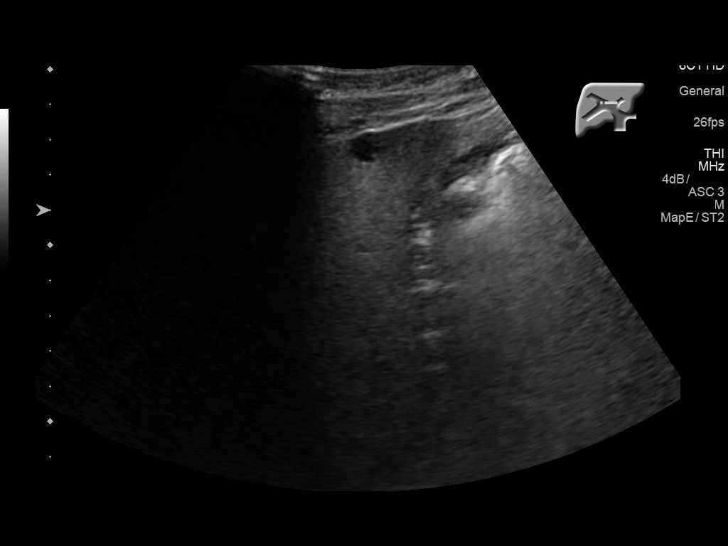
[im 32/51]
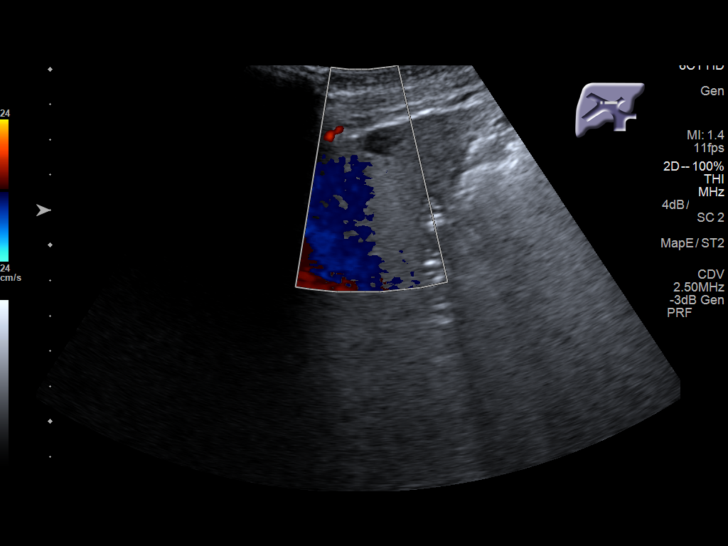
[im 34/51]
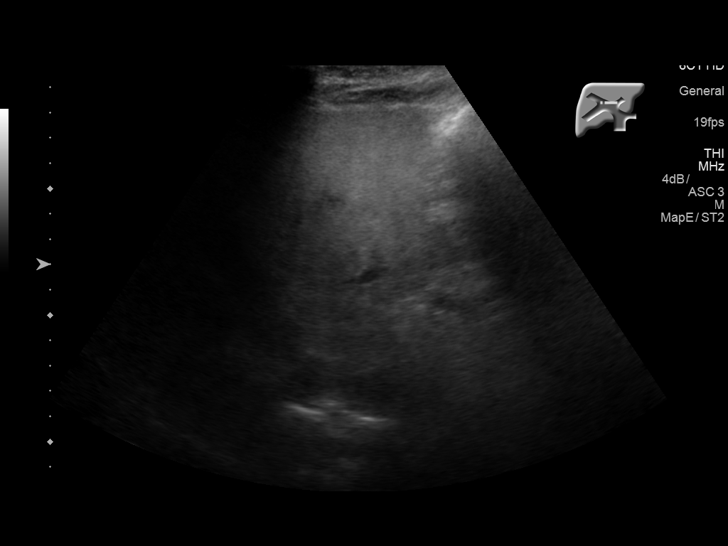
[im 38/51]
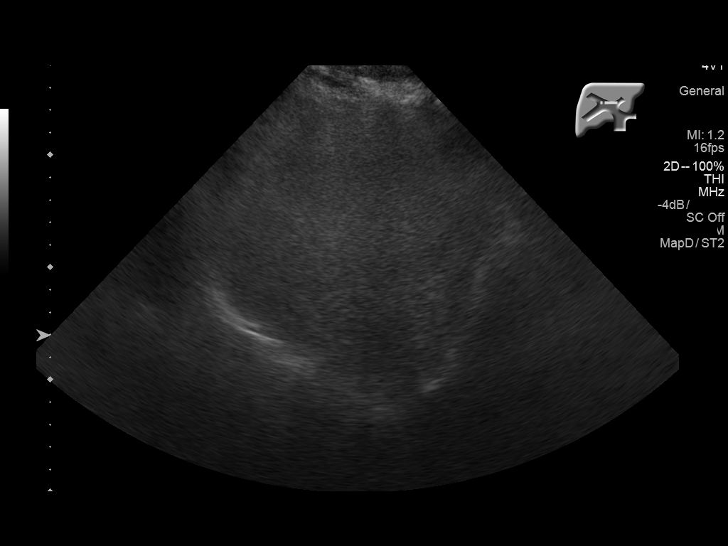
[im 42/51]
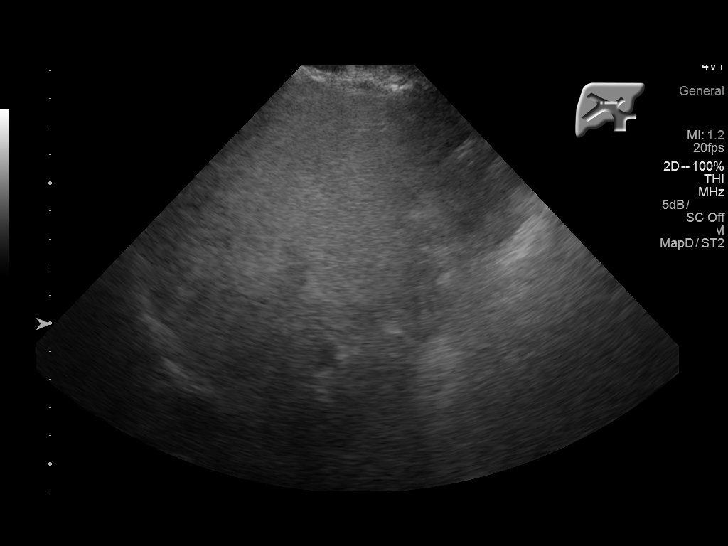
[im 46/51]
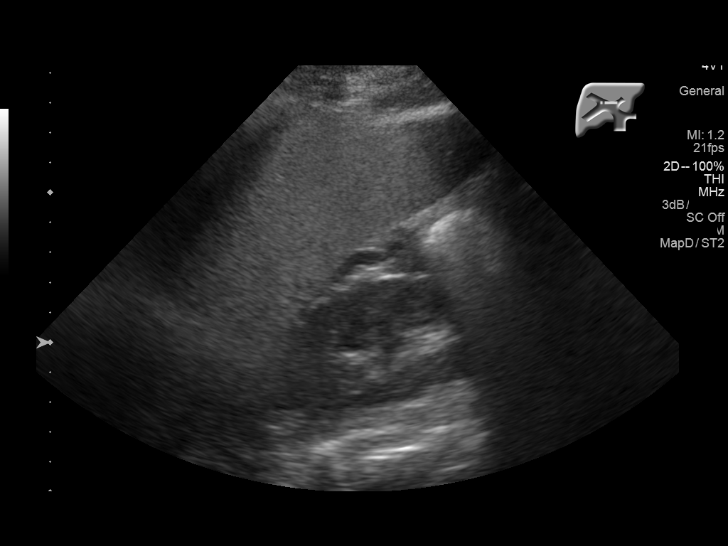
[im 51/51]
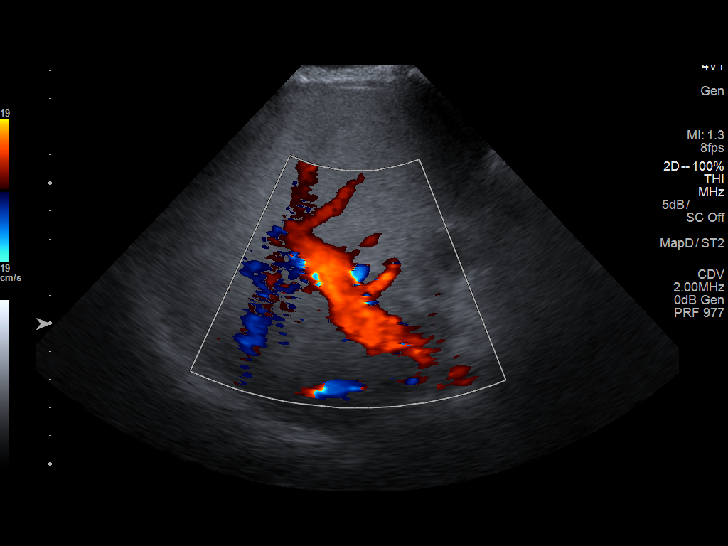

[14 of 25 positions shown; findings below may reference images not displayed]

FINDINGS: Gallbladder:

Cholecystectomy.

Common bile duct:

Diameter: 6.7 mm

Liver:

Increased echogenicity of the liver noted consistent fatty
infiltration and/or hepatocellular disease. A 1.1 cm simple cyst in
the left hepatic lobe. Questionable very mild ascites. Tiny right
pleural effusion.
IMPRESSION: 1. Cholecystectomy.  No biliary distention.

2. Increased hepatic echogenicity consistent fatty infiltration
and/or hepatocellular disease. Questions mild ascites. Small right
pleural effusion.

## 2018-03-31 IMAGING — US US EXTREM  UP VENOUS*R*
1 series · 13 of 24 positions shown · non-contrast
Comparison: None.

CLINICAL DATA: EDEMA OF THE RIGHT ARM.



[Series 1: us extrem up venous*right* · 0.07mm/px · 13 of 33 slices shown]
[im 1/33]
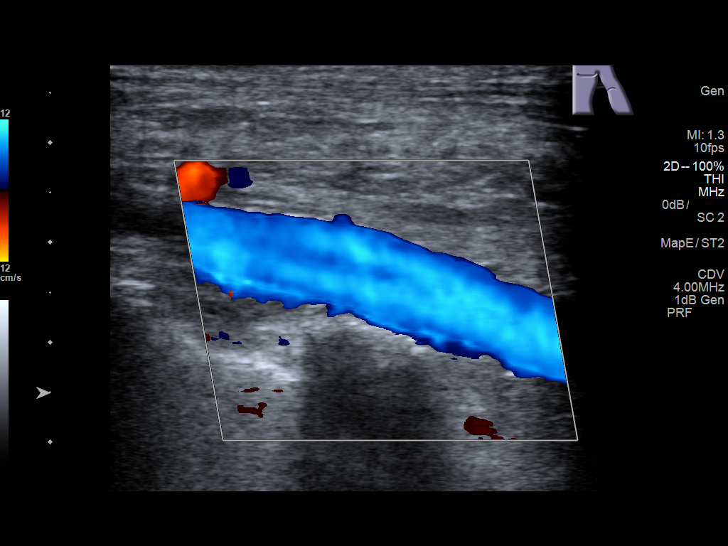
[im 3/33]
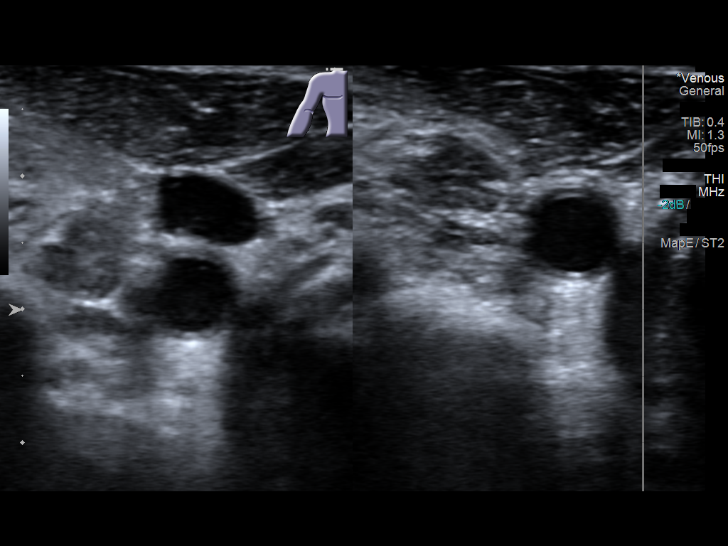
[im 6/33]
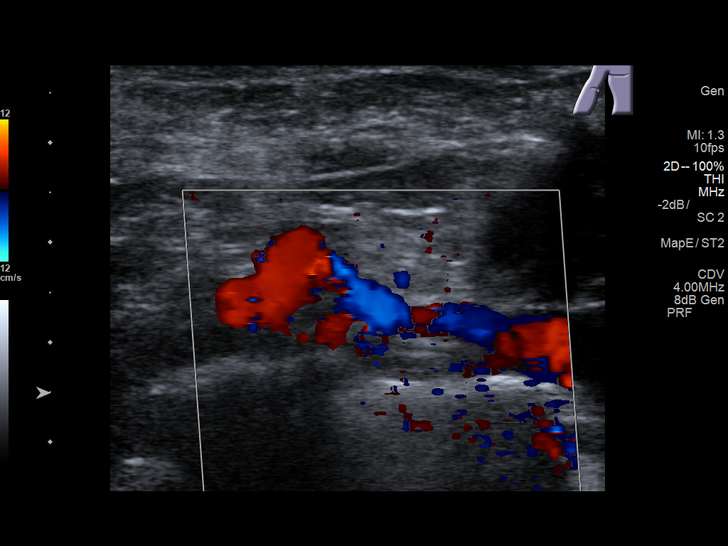
[im 9/33]
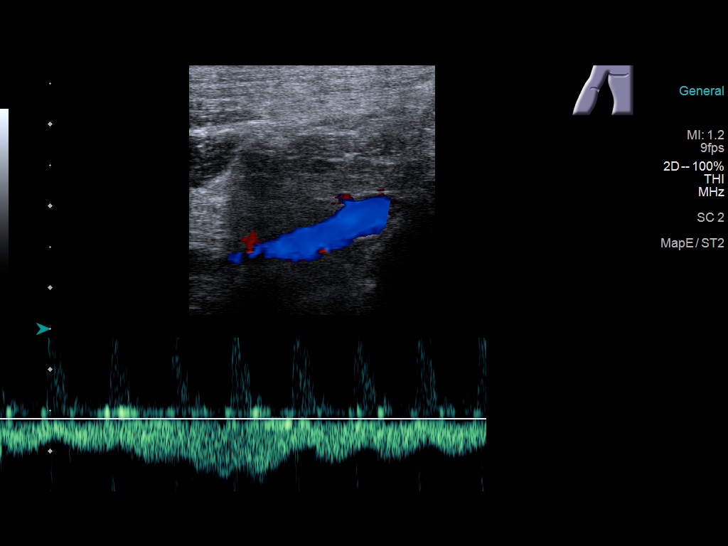
[im 12/33]
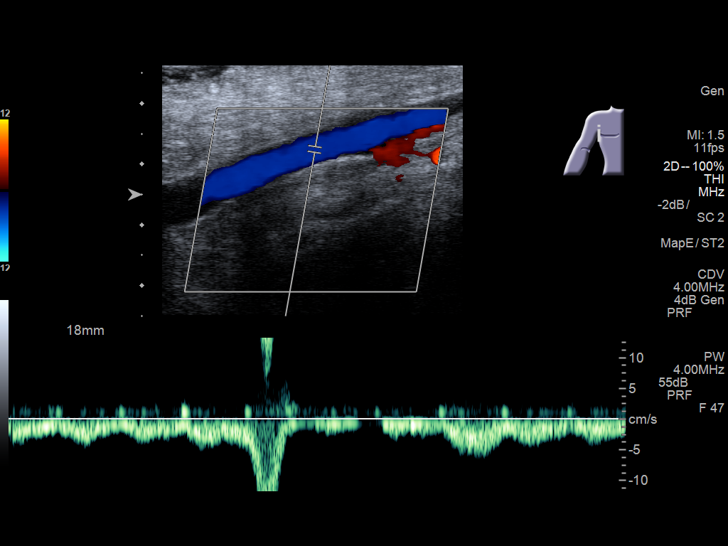
[im 14/33]
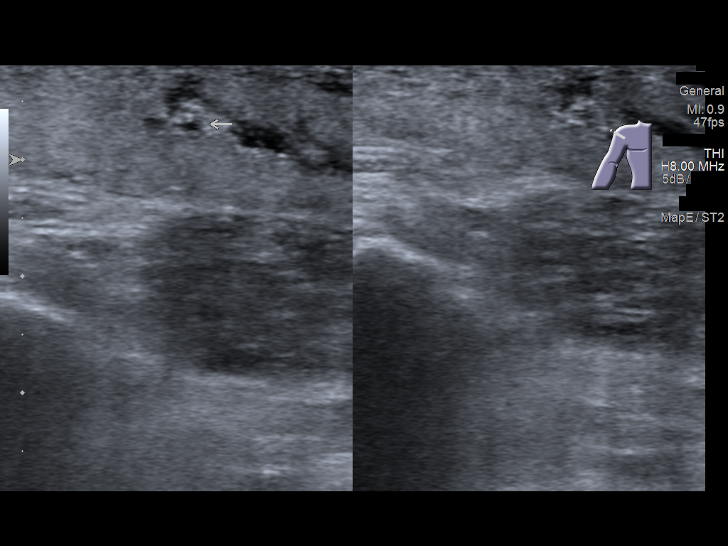
[im 17/33]
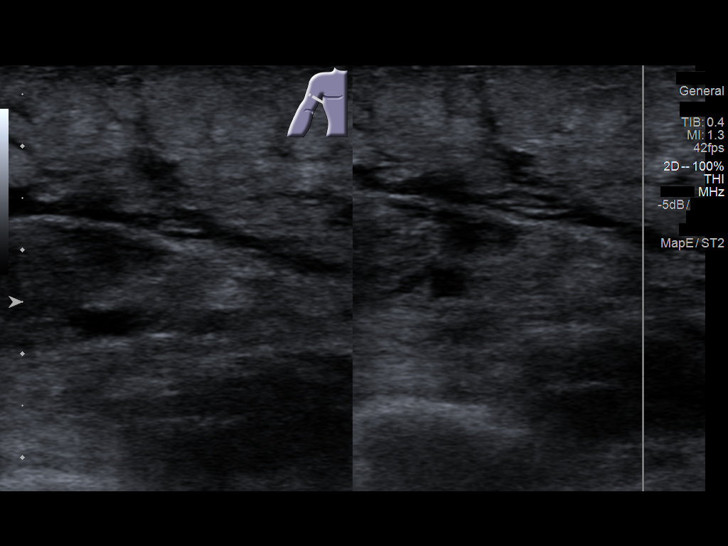
[im 19/33]
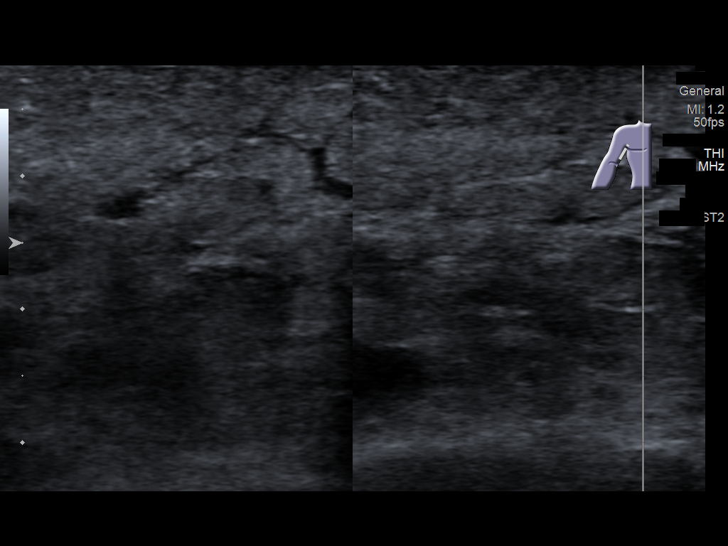
[im 21/33]
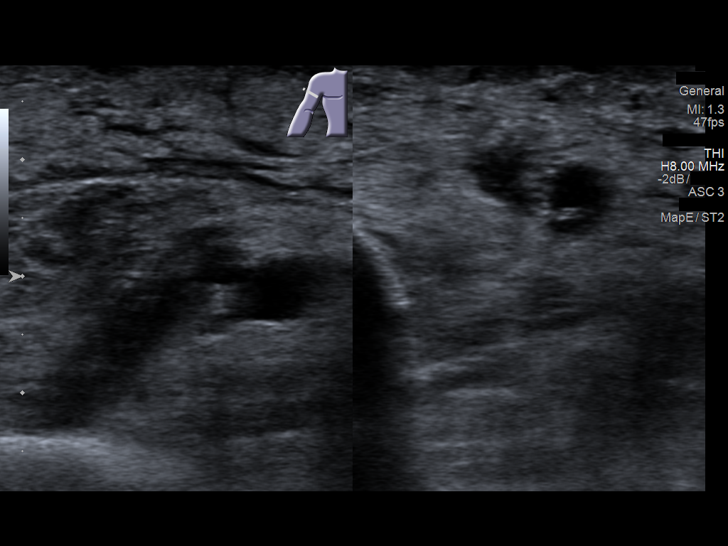
[im 24/33]
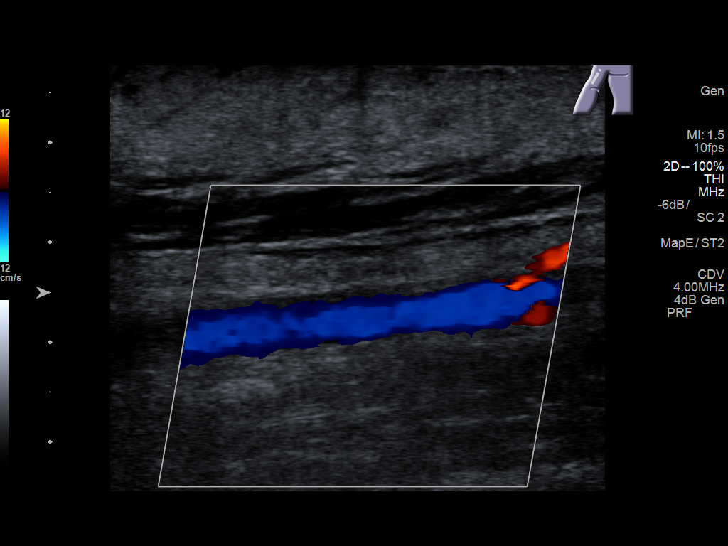
[im 27/33]
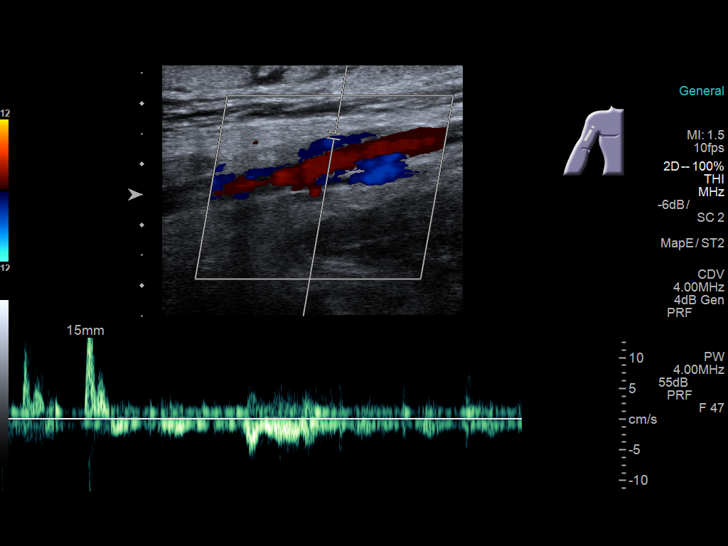
[im 30/33]
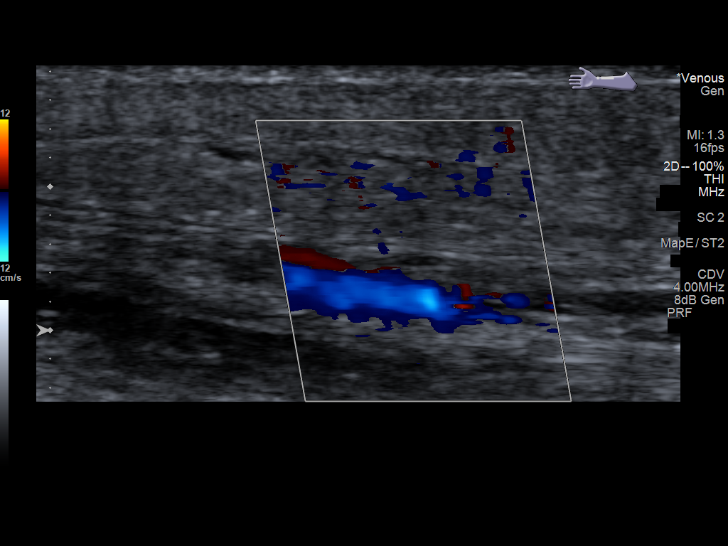
[im 33/33]
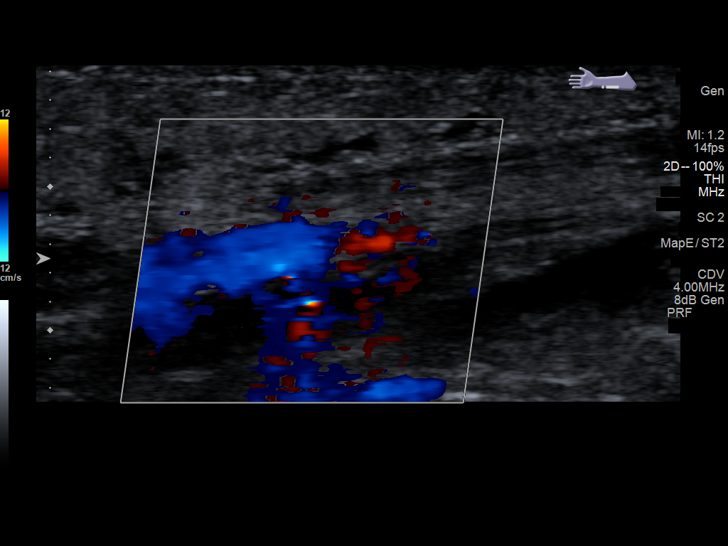

[13 of 24 positions shown; findings below may reference images not displayed]

FINDINGS: Contralateral Subclavian Vein: Respiratory phasicity is normal and
symmetric with the symptomatic side. No evidence of thrombus. Normal
compressibility.

Internal Jugular Vein: No evidence of thrombus. Normal
compressibility, respiratory phasicity and response to augmentation.

Subclavian Vein: No evidence of thrombus. Normal compressibility,
respiratory phasicity and response to augmentation.

Axillary Vein: No evidence of thrombus. Normal compressibility,
respiratory phasicity and response to augmentation.

Cephalic Vein: No evidence of thrombus. Normal compressibility,
respiratory phasicity and response to augmentation.

Basilic Vein: Limited visualization due to edema. No evidence of
thrombus.

Brachial Veins: No evidence of thrombus. Normal compressibility,
respiratory phasicity and response to augmentation.

Radial Veins: No evidence of thrombus. Normal compressibility,
respiratory phasicity and response to augmentation.

Ulnar Veins: No evidence of thrombus. Normal compressibility,
respiratory phasicity and response to augmentation.

Venous Reflux:  None visualized.

Other Findings:  None visualized.
IMPRESSION: No evidence of DVT within the right upper extremity.

## 2018-06-02 IMAGING — CR DG CHEST 2V
2 series · 2 of 2 positions shown · non-contrast
Comparison: 10/14/2016

CLINICAL DATA: Altered mental status, unresponsive

EXAM:
CHEST  2 VIEW

[w chest lat]
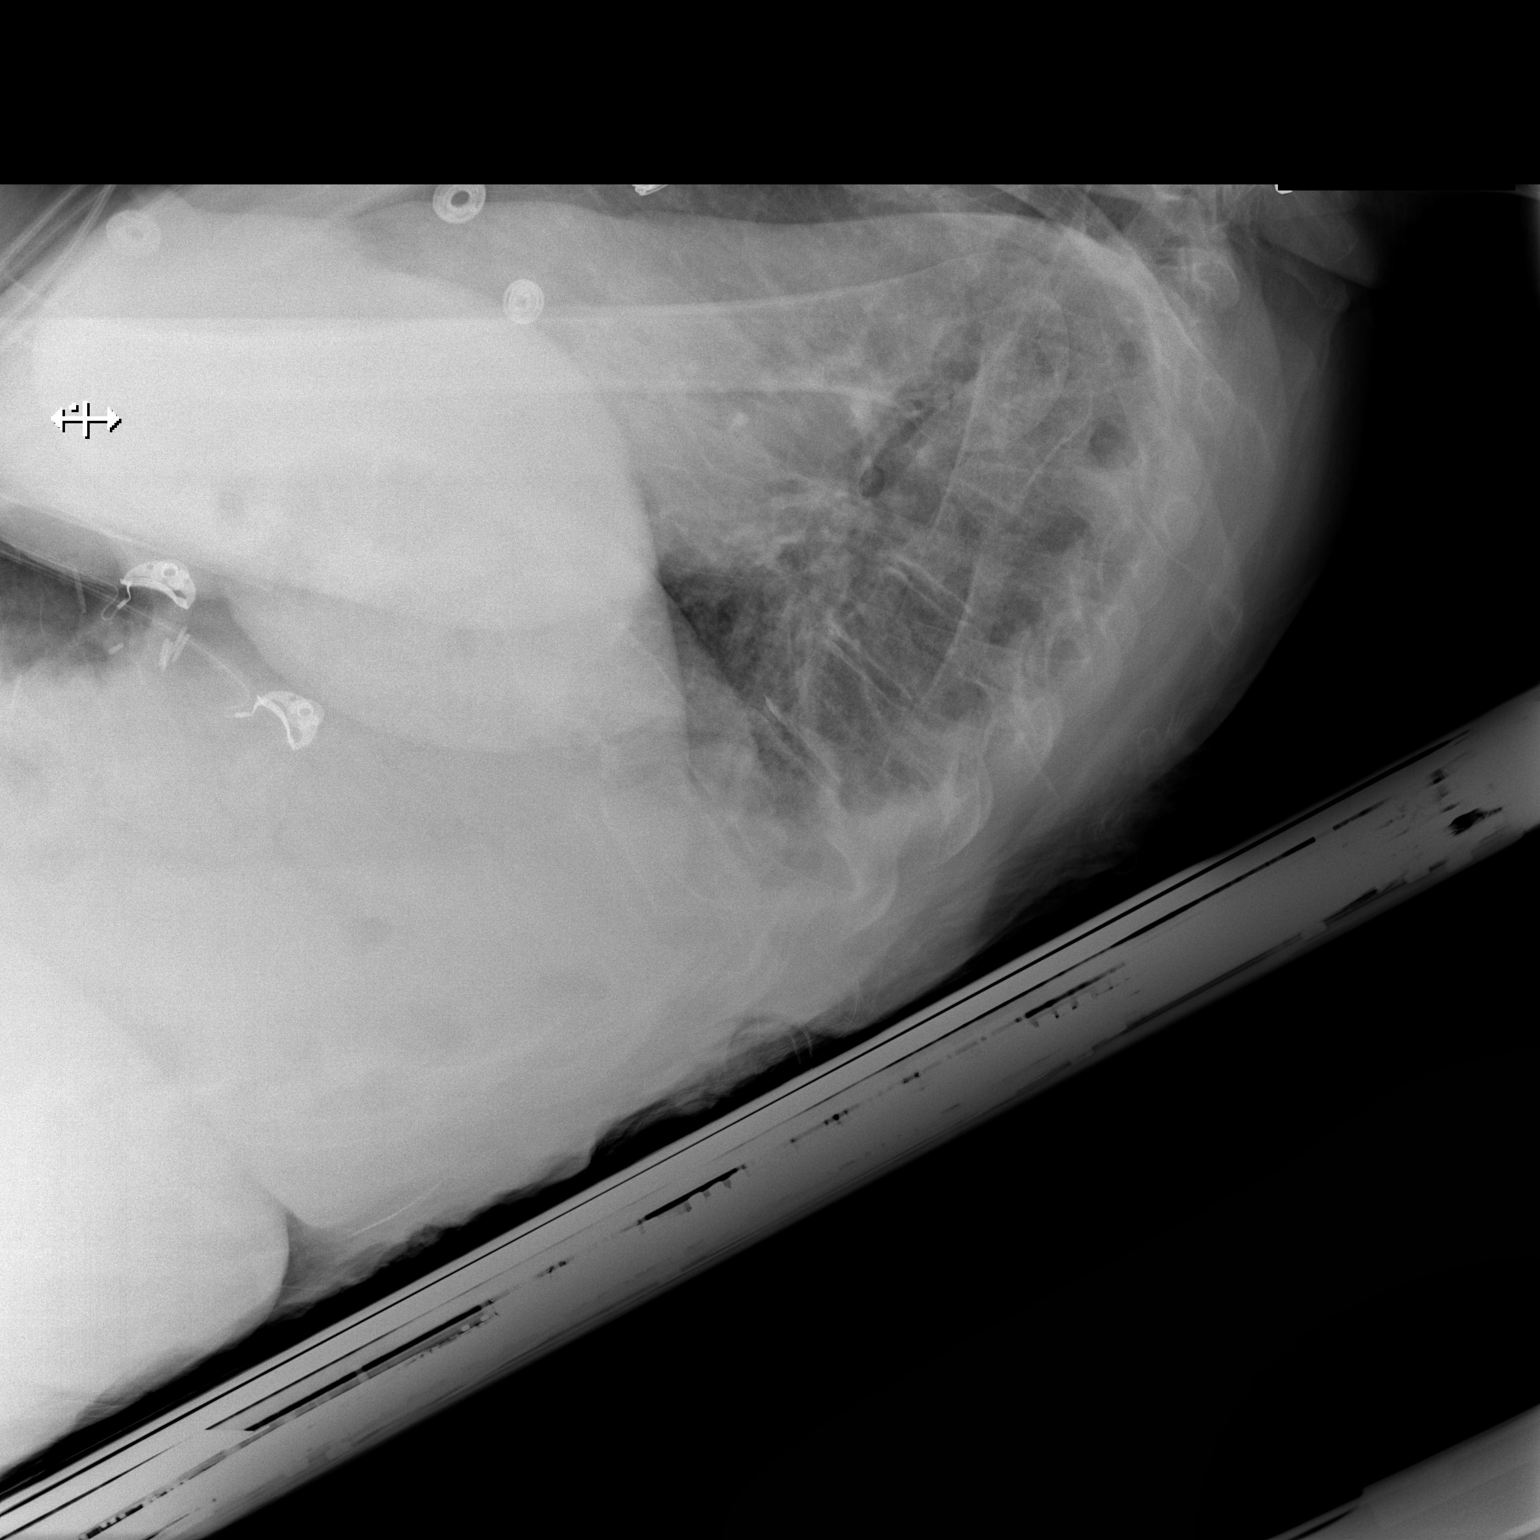

[x chest ap]
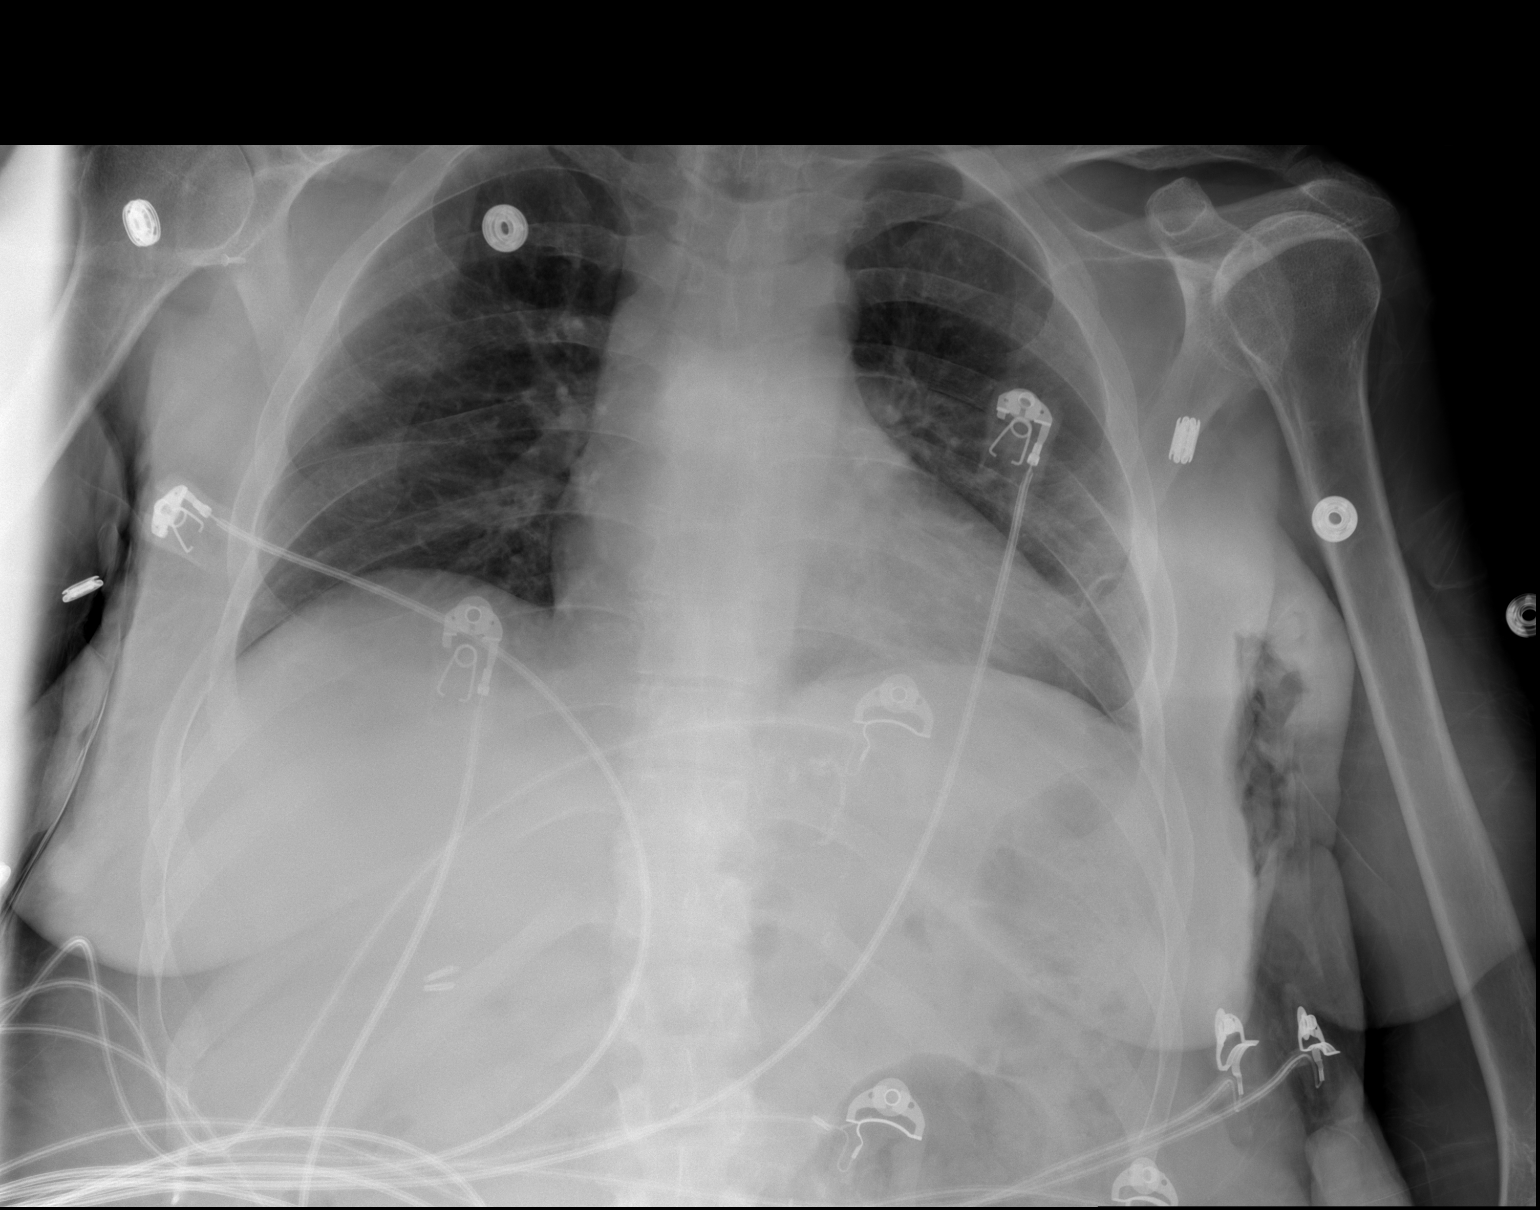

[2 of 2 positions shown; findings below may reference images not displayed]

FINDINGS: Low lung volumes. No focal consolidation or effusion. Normal
cardiomediastinal silhouette. No pneumothorax. Suspected callus
formation at the mid aspect of right clavicle. Gas over the left
lateral lower chest wall may relate to gas within skin folds.
IMPRESSION: 1. Low lung volumes.  Negative for infiltrate or edema
2. Subacute/healing fracture of the right clavicle
3. Gas overlying the left lateral lower chest wall soft tissues,
possibly related to skin folds, clinical correlation recommended

## 2018-06-03 IMAGING — DX DG ABDOMEN 1V
1 series · 1 of 1 positions shown · non-contrast
Comparison: Abdominal x-ray dated July 09, 2014.

CLINICAL DATA: NG tube placement.

EXAM:
ABDOMEN - 1 VIEW

[abdomen kub]
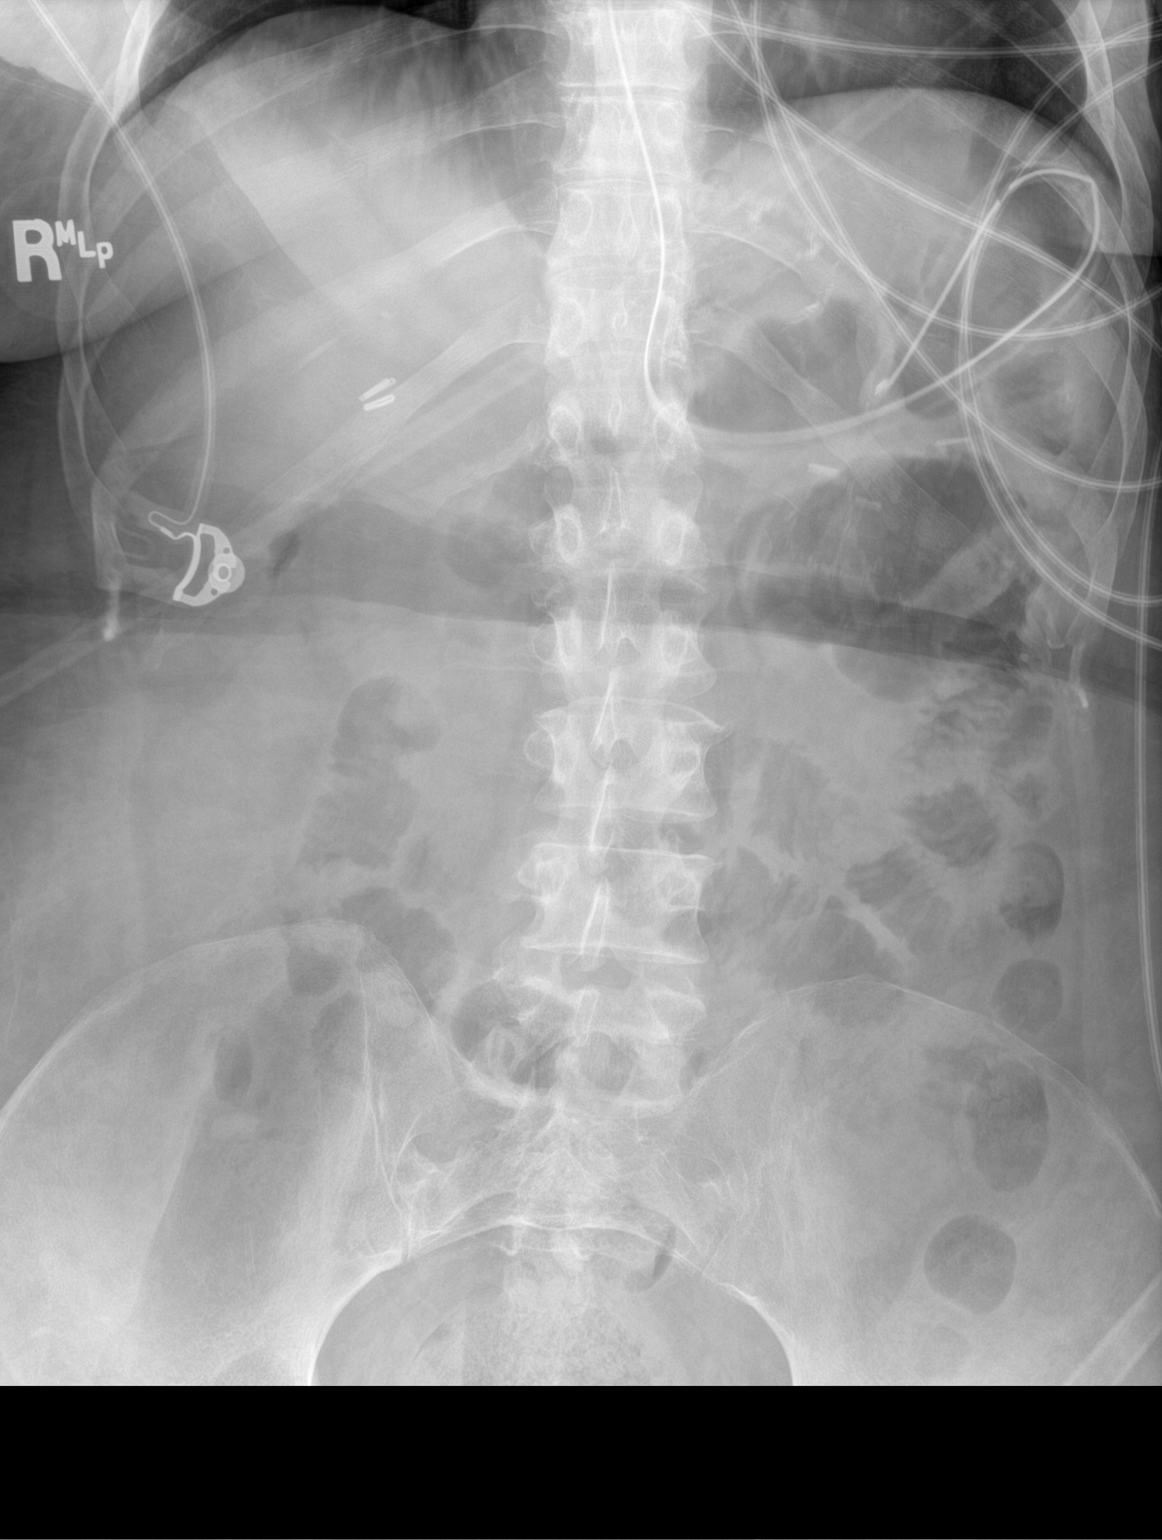

[1 of 1 positions shown; findings below may reference images not displayed]

FINDINGS: Nasogastric tube looped back on itself in the gastric fundus. Large
stool ball in the rectum. The bowel gas pattern is normal. No
radio-opaque calculi or other significant radiographic abnormality
are seen. Cholecystectomy.
IMPRESSION: 1. Nasogastric tube looped back on itself in the gastric fundus.
2. Large stool ball in the rectum.  Correlate for fecal impaction.
